# Patient Record
Sex: Male | Born: 1965
Health system: Southern US, Community
[De-identification: ages and names within clinical notes are randomized; demographics above are authoritative.]

## PROBLEM LIST (undated history)

## (undated) DIAGNOSIS — J40 Bronchitis, not specified as acute or chronic: Secondary | ICD-10-CM

## (undated) DIAGNOSIS — I82409 Acute embolism and thrombosis of unspecified deep veins of unspecified lower extremity: Secondary | ICD-10-CM

## (undated) DIAGNOSIS — F17200 Nicotine dependence, unspecified, uncomplicated: Secondary | ICD-10-CM

## (undated) DIAGNOSIS — M2041 Other hammer toe(s) (acquired), right foot: Secondary | ICD-10-CM

## (undated) DIAGNOSIS — M109 Gout, unspecified: Secondary | ICD-10-CM

## (undated) HISTORY — PX: NO PAST SURGERIES: SHX2092

---

## 2011-12-01 ENCOUNTER — Encounter (HOSPITAL_COMMUNITY): Payer: Self-pay | Admitting: *Deleted

## 2011-12-01 ENCOUNTER — Emergency Department (HOSPITAL_COMMUNITY)
Admission: EM | Admit: 2011-12-01 | Discharge: 2011-12-01 | Disposition: A | Discharge status: Home / Self Care | Payer: Self-pay | Attending: Emergency Medicine | Admitting: Emergency Medicine

## 2011-12-01 ENCOUNTER — Emergency Department (HOSPITAL_COMMUNITY): Payer: Self-pay

## 2011-12-01 DIAGNOSIS — R079 Chest pain, unspecified: Secondary | ICD-10-CM | POA: Insufficient documentation

## 2011-12-01 DIAGNOSIS — F17200 Nicotine dependence, unspecified, uncomplicated: Secondary | ICD-10-CM | POA: Insufficient documentation

## 2011-12-01 DIAGNOSIS — M109 Gout, unspecified: Secondary | ICD-10-CM | POA: Insufficient documentation

## 2011-12-01 LAB — CBC WITH DIFFERENTIAL/PLATELET
Basophils Relative: 0 % (ref 0–1)
HCT: 40.9 % (ref 39.0–52.0)
Hemoglobin: 13.7 g/dL (ref 13.0–17.0)
Lymphocytes Relative: 48 % — ABNORMAL HIGH (ref 12–46)
Lymphs Abs: 2.8 10*3/uL (ref 0.7–4.0)
MCHC: 33.5 g/dL (ref 30.0–36.0)
Monocytes Absolute: 0.3 10*3/uL (ref 0.1–1.0)
Monocytes Relative: 5 % (ref 3–12)
Neutro Abs: 2.6 10*3/uL (ref 1.7–7.7)
Neutrophils Relative %: 44 % (ref 43–77)
RBC: 4.85 MIL/uL (ref 4.22–5.81)

## 2011-12-01 LAB — BASIC METABOLIC PANEL
BUN: 11 mg/dL (ref 6–23)
CO2: 24 mEq/L (ref 19–32)
Chloride: 105 mEq/L (ref 96–112)
Creatinine, Ser: 1.24 mg/dL (ref 0.50–1.35)
GFR calc Af Amer: 79 mL/min — ABNORMAL LOW (ref >90)
Glucose, Bld: 157 mg/dL — ABNORMAL HIGH (ref 70–99)
Potassium: 3.8 mEq/L (ref 3.5–5.1)

## 2011-12-01 MED ORDER — GI COCKTAIL ~~LOC~~
30.0000 mL | Freq: Once | ORAL | Status: AC
  Administered 2011-12-01: 30 mL via ORAL
  Filled 2011-12-01: qty 30

## 2011-12-01 MED ORDER — IBUPROFEN 400 MG PO TABS
600.0000 mg | ORAL_TABLET | Freq: Once | ORAL | Status: AC
  Administered 2011-12-01: 600 mg via ORAL
  Filled 2011-12-01: qty 2

## 2011-12-01 MED ORDER — OMEPRAZOLE 20 MG PO CPDR: 20.0000 mg | DELAYED_RELEASE_CAPSULE | Freq: Every day | ORAL | Status: DC

## 2011-12-01 MED ORDER — OXYCODONE-ACETAMINOPHEN 5-325 MG PO TABS
1.0000 | ORAL_TABLET | Freq: Once | ORAL | Status: AC
  Administered 2011-12-01: 1 via ORAL
  Filled 2011-12-01: qty 1

## 2011-12-01 NOTE — ED Notes (Signed)
NAD noted at time of d/c home 

## 2011-12-01 NOTE — ED Notes (Signed)
PT states left side rib pain and burning pain that radiates up through chest intermittently.  Pt reports hip pain and pins and needles in both feet for a while.  Pt is not diabetic

## 2011-12-01 NOTE — ED Notes (Signed)
At time of d/c pt appeared upset that he was not given "percocet or vicoden"

## 2011-12-01 NOTE — ED Provider Notes (Signed)
History     CSN: [id number]  Arrival date & time 12/01/11  1107   First MD Initiated Contact with Patient 12/01/11 1516      Chief complaint: Back pain, chest burning, right great toe pain.   The history is provided by the patient.   the patient reports 3 months of rather persistent left-sided chest burning.  When the burning becomes severe he does not have any shortness of breath nausea vomiting or diaphoresis.  He denies radiation to his arms.  He smokes cigarettes but otherwise has no cardiac risk factors.  He states that he walks 30 minutes each way to the history stated by cigarettes and never develops chest pain or burning at that time.  He also reports he is developing new right great toe pain over the past 24 hours.  He denies fevers or chills.  No erythema.  He has no prior history of gout.  He reports a recent trauma to his right great toe.  He also reports a sensation of "pins and needles" in both feet as well as the tip of his fingers for several months.  He states is not a diabetic and has been checked within the past year for diabetes.  He currently is without a primary care physician and would like information to find one.  Denies cough or congestion.  He has no shortness of breath at this time.  Currently is without any burning discomfort in his chest.  He denies exacerbation of his symptoms with eating or exertion.  History reviewed. No pertinent past medical history.  History reviewed. No pertinent past surgical history.   Family history: Negative for early cardiac disease  History  Substance Use Topics  . Smoking status: Current Every Day Smoker  . Smokeless tobacco: Not on file  . Alcohol Use: No      Review of Systems  All other systems reviewed and are negative.    Allergies  Tramadol and Vicodin  Home Medications  No current outpatient prescriptions on file.  BP 132/91  Pulse 71  Temp 98.1 F (36.7 C) (Oral)  Resp 16  SpO2 100%  Physical Exam    Nursing note and vitals reviewed. Constitutional: He is oriented to person, place, and time. He appears well-developed and well-nourished.  HENT:  Head: Normocephalic and atraumatic.  Eyes: EOM are normal.  Neck: Normal range of motion.  Cardiovascular: Normal rate, regular rhythm, normal heart sounds and intact distal pulses.   Pulmonary/Chest: Effort normal and breath sounds normal. No respiratory distress.  Abdominal: Soft. He exhibits no distension. There is no tenderness.  Musculoskeletal: Normal range of motion.       Right great toe with mild amount of discomfort with movement of his MTP joint on the right great toe.  No erythema or swelling.  Normal perfusion distally.  Normal pulses in his right foot.  Neurological: He is alert and oriented to person, place, and time.       5 out of 5 strength in bilateral upper and lower extremity major muscle groups  Skin: Skin is warm and dry.  Psychiatric: He has a normal mood and affect. Judgment normal.    ED Course  Procedures (including critical care time)   Date: 12/01/2011  Rate: 79  Rhythm: normal sinus rhythm  QRS Axis: normal  Intervals: normal  ST/T Wave abnormalities: normal  Conduction Disutrbances: none  Narrative Interpretation:   Old EKG Reviewed: no prior ecg   Labs Reviewed  CBC WITH DIFFERENTIAL -  Abnormal; Notable for the following:    Lymphocytes Relative 48 (*)     All other components within normal limits  BASIC METABOLIC PANEL - Abnormal; Notable for the following:    Glucose, Bld 157 (*)     GFR calc non Af Amer 68 (*)     GFR calc Af Amer 79 (*)     All other components within normal limits  POCT I-STAT TROPONIN I   Dg Chest 2 View  12/01/2011  *RADIOLOGY REPORT*  Clinical Data: Left-sided chest pain  CHEST - 2 VIEW  Comparison: None.  Findings: The heart and pulmonary vascularity are within normal limits.  The lungs are clear bilaterally.  The bony structures are within normal limits.  IMPRESSION:  No acute abnormality seen.   Original Report Authenticated By: Phillips Odor, M.D.     I personally reviewed the imaging tests through PACS system  I reviewed available ER/hospitalization records thought the EMR   1. Chest pain   2. Gout       MDM  The patient's symptoms are very atypical and I doubt this is ACS.  Cardiac enzymes and chest x-ray pending.  EKG normal sinus rhythm without significant changes.  A lot of his complaints are more primary care related immunodeficient developing a relationship with a primary care doctor.  I suspect this right great toe pain is probably early gout.  He has no signs of erythema or swelling or septic arthritis at this time.  5:01 PM Pain-free at this time.  This is not a cardiac event.  Home with Prilosec.  Close PCP followup        Lyanne Co, MD 12/01/11 [phone]

## 2012-07-01 ENCOUNTER — Emergency Department (HOSPITAL_COMMUNITY): Payer: Self-pay

## 2012-07-01 ENCOUNTER — Encounter (HOSPITAL_COMMUNITY): Payer: Self-pay | Admitting: *Deleted

## 2012-07-01 ENCOUNTER — Emergency Department (HOSPITAL_COMMUNITY)
Admission: EM | Admit: 2012-07-01 | Discharge: 2012-07-01 | Disposition: A | Discharge status: Home / Self Care | Payer: Self-pay | Attending: Emergency Medicine | Admitting: Emergency Medicine

## 2012-07-01 DIAGNOSIS — R05 Cough: Secondary | ICD-10-CM | POA: Insufficient documentation

## 2012-07-01 DIAGNOSIS — R059 Cough, unspecified: Secondary | ICD-10-CM | POA: Insufficient documentation

## 2012-07-01 DIAGNOSIS — J069 Acute upper respiratory infection, unspecified: Secondary | ICD-10-CM | POA: Insufficient documentation

## 2012-07-01 DIAGNOSIS — R0982 Postnasal drip: Secondary | ICD-10-CM | POA: Insufficient documentation

## 2012-07-01 DIAGNOSIS — J029 Acute pharyngitis, unspecified: Secondary | ICD-10-CM | POA: Insufficient documentation

## 2012-07-01 DIAGNOSIS — F17200 Nicotine dependence, unspecified, uncomplicated: Secondary | ICD-10-CM | POA: Insufficient documentation

## 2012-07-01 DIAGNOSIS — Z8709 Personal history of other diseases of the respiratory system: Secondary | ICD-10-CM | POA: Insufficient documentation

## 2012-07-01 HISTORY — DX: Bronchitis, not specified as acute or chronic: J40

## 2012-07-01 LAB — RAPID STREP SCREEN (MED CTR MEBANE ONLY): Streptococcus, Group A Screen (Direct): NEGATIVE

## 2012-07-01 MED ORDER — GUAIFENESIN ER 600 MG PO TB12: 1200.0000 mg | ORAL_TABLET | Freq: Two times a day (BID) | ORAL | Status: DC

## 2012-07-01 MED ORDER — HYDROCODONE-ACETAMINOPHEN 7.5-325 MG/15ML PO SOLN: 15.0000 mL | Freq: Four times a day (QID) | ORAL | Status: DC | PRN

## 2012-07-01 MED ORDER — DIPHENHYDRAMINE HCL 25 MG PO TABS: 25.0000 mg | ORAL_TABLET | Freq: Four times a day (QID) | ORAL | Status: DC | PRN

## 2012-07-01 NOTE — ED Notes (Signed)
C/o nasal congestion, sore throat and cough x 1 week.

## 2012-07-01 NOTE — ED Notes (Signed)
Pt reports nasal congestion, cough, throat drainage and soreness x 1 week.

## 2012-07-01 NOTE — ED Provider Notes (Signed)
History     CSN: [id number]  Arrival date & time 07/01/12  1213   First MD Initiated Contact with Patient 07/01/12 1238      Chief Complaint  Patient presents with  . Nasal Congestion    (Consider location/radiation/quality/duration/timing/severity/associated sxs/prior treatment) The history is provided by the patient. No language interpreter was used.  SUBJECTIVE:  Terry Poole is a 47 y.o. male who complains of coryza, congestion, sore throat, post nasal drip, productive cough, myalgias, headache for 7 days. He denies a history of anorexia, dizziness, fatigue, nausea, rash, shortness of breath, sweats, vomiting, weakness and wheezing and denies a history of asthma. Patient does smoke cigarettes. .Denies DOE, SOB, chest tightness or pressure, radiation to left arm, jaw or back, or diaphoresis. Denies dysuria, flank pain, suprapubic pain, frequency, urgency, or hematuria. Denies headaches, light headedness, weakness, visual disturbances. Denies abdominal pain, nausea, vomiting, diarrhea or constipation.      Past Medical History  Diagnosis Date  . Bronchitis     History reviewed. No pertinent past surgical history.  History reviewed. No pertinent family history.  History  Substance Use Topics  . Smoking status: Current Every Day Smoker -- 0.50 packs/day    Types: Cigarettes  . Smokeless tobacco: Not on file  . Alcohol Use: No      Review of Systems Ten systems reviewed and are negative for acute change, except as noted in the HPI.   Allergies  Tramadol and Vicodin  Home Medications  No current outpatient prescriptions on file.  BP 134/88  Pulse 77  Temp(Src) 98.4 F (36.9 C)  Resp 20  Ht 5\' 9"  (1.753 m)  Wt 235 lb (106.595 kg)  BMI 34.69 kg/m2  SpO2 99%  Physical Exam Physical Exam  Appears moderately ill but not toxic; temperature as noted in vitals. Ears normal. Eyes:glassy appearance, no discharge  Heart: RRR, NO M/G/R Throat and pharynx normal.    Neck supple. No adenopathyhy in the neck.  Sinuses non tender.  The chest is clear, with decreased breath sounds over the left lower lung. Abdomen is soft and nontende    ED Course  Procedures (including critical care time)  Labs Reviewed  RAPID STREP SCREEN  CULTURE, GROUP A STREP   Dg Chest 2 View  07/01/2012   *RADIOLOGY REPORT*  Clinical Data: Sob, cp, a cough, and congestion x 1 week. Pt has hx of bronchitis. Smoker.  CHEST - 2 VIEW  Comparison: 12/01/2011  Findings: Left basilar subsegmental atelectasis or scarring. Thoracic spondylosis noted.  Borderline cardiomegaly (cardiothoracic index 50%).  Unchanged lucent lesion in medial right clavicle, thin sclerotic margin, 1.4 x 0.7 cm.  IMPRESSION:  1.  Left basilar subsegmental atelectasis or scarring. 2.  Borderline cardiomegaly. 3.  Lucent lesion medial right clavicle, unchanged from 7 months ago, probably a fibrous cortical defect or similar benign lesion.   Original Report Authenticated By: Gaylyn Rong, M.D.     1. URI, acute       MDM  4:50 PM BP 134/88  Pulse 77  Temp(Src) 98.4 F (36.9 C)  Resp 20  Ht 5\' 9"  (1.753 m)  Wt 235 lb (106.595 kg)  BMI 34.69 kg/m2  SpO2 99% Patient with URI. Discussed abnormal findings and suggest repeat cxr in 6 mos to make sure no changes in the lesion. Pt CXR negative for acute infiltrate. Patients symptoms are consistent with URI, likely viral etiology. Discussed that antibiotics are not indicated for viral infections. Pt will be discharged with symptomatic treatment.  Verbalizes understanding and is agreeable with plan. Pt is hemodynamically stable & in NAD prior to dc.          Arthor Captain, PA-C 07/01/12 1652

## 2012-07-02 NOTE — ED Provider Notes (Signed)
Medical screening examination/treatment/procedure(s) were performed by non-physician practitioner and as supervising physician I was immediately available for consultation/collaboration.    Vida Roller, MD 07/02/12 [phone]

## 2012-07-03 LAB — CULTURE, GROUP A STREP

## 2012-09-14 ENCOUNTER — Emergency Department (HOSPITAL_COMMUNITY): Payer: Self-pay

## 2012-09-14 ENCOUNTER — Encounter (HOSPITAL_COMMUNITY): Payer: Self-pay | Admitting: Nurse Practitioner

## 2012-09-14 ENCOUNTER — Emergency Department (HOSPITAL_COMMUNITY)
Admission: EM | Admit: 2012-09-14 | Discharge: 2012-09-14 | Disposition: A | Discharge status: Home / Self Care | Payer: Self-pay | Attending: Emergency Medicine | Admitting: Emergency Medicine

## 2012-09-14 DIAGNOSIS — R42 Dizziness and giddiness: Secondary | ICD-10-CM | POA: Insufficient documentation

## 2012-09-14 DIAGNOSIS — Z8709 Personal history of other diseases of the respiratory system: Secondary | ICD-10-CM | POA: Insufficient documentation

## 2012-09-14 DIAGNOSIS — R042 Hemoptysis: Secondary | ICD-10-CM | POA: Insufficient documentation

## 2012-09-14 DIAGNOSIS — F17200 Nicotine dependence, unspecified, uncomplicated: Secondary | ICD-10-CM | POA: Insufficient documentation

## 2012-09-14 DIAGNOSIS — F411 Generalized anxiety disorder: Secondary | ICD-10-CM | POA: Insufficient documentation

## 2012-09-14 DIAGNOSIS — K029 Dental caries, unspecified: Secondary | ICD-10-CM | POA: Insufficient documentation

## 2012-09-14 LAB — BASIC METABOLIC PANEL
BUN: 10 mg/dL (ref 6–23)
Chloride: 106 mEq/L (ref 96–112)
GFR calc Af Amer: >90 mL/min (ref >90)
GFR calc non Af Amer: 86 mL/min — ABNORMAL LOW (ref >90)
Potassium: 3.7 mEq/L (ref 3.5–5.1)

## 2012-09-14 LAB — CBC
HCT: 42.5 % (ref 39.0–52.0)
MCHC: 33.4 g/dL (ref 30.0–36.0)
Platelets: 237 10*3/uL (ref 150–400)
RDW: 14.7 % (ref 11.5–15.5)
WBC: 6.3 10*3/uL (ref 4.0–10.5)

## 2012-09-14 LAB — POCT I-STAT TROPONIN I: Troponin i, poc: 0.01 ng/mL (ref 0.00–0.08)

## 2012-09-14 NOTE — ED Notes (Signed)
C/o burning pain in throat and lower back, bloody sputum this am. Denies cough, vomiting. States "i just kept spitting up blood."

## 2012-09-14 NOTE — ED Provider Notes (Signed)
CSN: [id number]     Arrival date & time 09/14/12  1020 History     First MD Initiated Contact with Patient 09/14/12 1121     Chief Complaint  Patient presents with  . Hemoptysis   (Consider location/radiation/quality/duration/timing/severity/associated sxs/prior Treatment) HPI 47 year old male presents emergency department chief complaint of "spitting out blood." History is limited by patient's capacity to communicate effectively.  I suspect an element of limited cognitive ability.  Patient states that this morning he cleared his throat and then began spitting up approximately a teaspoon of blood.  He stated this occurred approximately 10 times.  He is unsure if he coughed or vomited.  He states that it made him feel dizzy and anxious.  He has never done this before.  He is a currently a pack-a-day smoker.  Patient denies any recent history of sinus infection, sore throat, injury to mouth.  He does have some dental decay.  Patient denies symptoms of upper respiratory infection or cough.  He states he has a history of previous alcohol abuse but has not drank alcohol in the last 26 years.  Patient denies abdominal pain, nausea, vomiting, chest pain, shortness of breath.  He denies any history of recent unilateral calf pain, swelling, previous history of DVT or PE.  Patient denies any pain currently.  He takes no anticoagulants.  Past Medical History  Diagnosis Date  . Bronchitis    History reviewed. No pertinent past surgical history. History reviewed. No pertinent family history. History  Substance Use Topics  . Smoking status: Current Every Day Smoker -- 0.50 packs/day    Types: Cigarettes  . Smokeless tobacco: Not on file  . Alcohol Use: No    Review of Systems Ten systems reviewed and are negative for acute change, except as noted in the HPI.   Allergies  Tramadol and Vicodin  Home Medications  No current outpatient prescriptions on file. BP 156/92  Pulse 90  Temp(Src) 98.1  F (36.7 C) (Oral)  Resp 17  Ht 5\' 9"  (1.753 m)  Wt 236 lb (107.049 kg)  BMI 34.84 kg/m2  SpO2 96% Physical Exam Physical Exam  Nursing note and vitals reviewed. Constitutional: He appears well-developed and well-nourished. No distress.  HENT:  Head: Normocephalic and atraumatic.  Eyes: Conjunctivae normal are normal. No scleral icterus.  Mouth: Multiple decayed teeth. Throat: Mildly erythematous oropharynx without exudate, edema.  Uvula midline. Neck: Normal range of motion. Neck supple.  Cardiovascular: Normal rate, regular rhythm and normal heart sounds.   Pulmonary/Chest: Effort normal and breath sounds normal. No respiratory distress.  Abdominal: Soft. There is no tenderness.  Musculoskeletal: He exhibits no edema.  no peripheral edema or calf tenderness.   Neurological: He is alert.  Skin: Skin is warm and dry. He is not diaphoretic.  Psychiatric: His behavior is normal.    ED Course   Procedures (including critical care time)  Labs Reviewed  CBC  BASIC METABOLIC PANEL   Dg Chest 2 View  09/14/2012   *RADIOLOGY REPORT*  Clinical Data: Hemoptysis, bronchitis, smoker  CHEST - 2 VIEW  Comparison: 07/01/2012  Findings: Normal heart size and vascularity.  Chronic central bronchitic changes without focal pneumonia, collapse, consolidation, edema, effusion or pneumothorax.  Trachea midline. Mild thoracic degenerative change.  Overall stable exam.  IMPRESSION: Stable chest exam.  No superimposed acute process   Original Report Authenticated By: Judie Petit. Shick, M.D.   1. Spitting up blood      Date: 09/14/2012  Rate: 85  Rhythm: normal  sinus rhythm  QRS Axis: normal  Intervals: normal  ST/T Wave abnormalities: normal  Conduction Disutrbances: none  Narrative Interpretation:   Old EKG Reviewed: No significant changes noted  ' MDM  12:16 PM Filed Vitals:   09/14/12 1151  BP: 154/106  Pulse: 67  Temp:   Resp:    The patient here with complaint of spitting up blood.   Chest x-ray shows chronic bronchitic changes of associated with daily smoking.  No acute abnormalities noted.  Patient is hypertensive.  Patient's EKG shows no signs of acute ischemia or abnormality.  Negative troponin.  Patient mildly elevated glucose. The vitals are otherwise unremarkable.   Patient seen in shared visit with Dr. Jodi Mourning. No acute abnormality.  Patient poor historian however I feel that there is no concern for tuberculosis or intra-pulmonary pathology.  Patient is hypertensive needs PCP followup.  He will be discharged to community health.The patient appears reasonably screened and/or stabilized for discharge and I doubt any other medical condition or other [facility] requiring further screening, evaluation, or treatment in the ED at this time prior to discharge.   Arthor Captain, PA-C 09/14/12 2133  Medical screening examination/treatment/procedure(s) were conducted as a shared visit with non-physician practitioner(s) or resident and myself. I personally evaluated the patient during the encounter and agree with the findings and plan unless otherwise indicated.  Few episodes of small amount of spitting up blood. Pt denies blood thinners, TB/ jail exposure, sick contacts, travel. Well appearing on exam, lungs clear. No abd pain.  CNs intact. Lab and cxr unremarkable. Smoker. Close fup outpt discussed.    Enid Skeens, MD 09/14/12 2258

## 2012-11-02 ENCOUNTER — Emergency Department (HOSPITAL_COMMUNITY)
Admission: EM | Admit: 2012-11-02 | Discharge: 2012-11-02 | Disposition: A | Discharge status: Home / Self Care | Payer: Self-pay | Attending: Emergency Medicine | Admitting: Emergency Medicine

## 2012-11-02 ENCOUNTER — Encounter (HOSPITAL_COMMUNITY): Payer: Self-pay | Admitting: *Deleted

## 2012-11-02 DIAGNOSIS — K089 Disorder of teeth and supporting structures, unspecified: Secondary | ICD-10-CM | POA: Insufficient documentation

## 2012-11-02 DIAGNOSIS — K0889 Other specified disorders of teeth and supporting structures: Secondary | ICD-10-CM

## 2012-11-02 DIAGNOSIS — K029 Dental caries, unspecified: Secondary | ICD-10-CM | POA: Insufficient documentation

## 2012-11-02 DIAGNOSIS — Z8709 Personal history of other diseases of the respiratory system: Secondary | ICD-10-CM | POA: Insufficient documentation

## 2012-11-02 DIAGNOSIS — F17200 Nicotine dependence, unspecified, uncomplicated: Secondary | ICD-10-CM | POA: Insufficient documentation

## 2012-11-02 MED ORDER — PENICILLIN V POTASSIUM 500 MG PO TABS: 500.0000 mg | ORAL_TABLET | Freq: Four times a day (QID) | ORAL | Status: DC

## 2012-11-02 MED ORDER — OXYCODONE-ACETAMINOPHEN 5-325 MG PO TABS
2.0000 | ORAL_TABLET | Freq: Once | ORAL | Status: AC
  Administered 2012-11-02: 2 via ORAL
  Filled 2012-11-02: qty 2

## 2012-11-02 MED ORDER — OXYCODONE-ACETAMINOPHEN 5-325 MG PO TABS: 2.0000 | ORAL_TABLET | ORAL | Status: DC | PRN

## 2012-11-02 MED ORDER — PENICILLIN V POTASSIUM 250 MG PO TABS
500.0000 mg | ORAL_TABLET | Freq: Once | ORAL | Status: AC
  Administered 2012-11-02: 500 mg via ORAL
  Filled 2012-11-02: qty 2

## 2012-11-02 NOTE — Discharge Planning (Signed)
[facility] [employer]  Patient was seen 09/14/12 and given the orange card application. Patient is here today (11/02/12) with dental pain and the completed application. I made the patient an appointment for Nov 09, 2012 at the [facility] to turn in the completed application and to obtain his orange card.

## 2012-11-02 NOTE — ED Provider Notes (Signed)
Medical screening examination/treatment/procedure(s) were performed by non-physician practitioner and as supervising physician I was immediately available for consultation/collaboration.  Doug Sou, MD 11/02/12 [phone]

## 2012-11-02 NOTE — ED Notes (Signed)
Pt reports right upper dental pain and multiple broken teeth and now swelling and pain. Airway intact.

## 2012-11-02 NOTE — ED Provider Notes (Signed)
CSN: [id number]     Arrival date & time 11/02/12  4098 History   First MD Initiated Contact with Patient 11/02/12 [phone]     Chief Complaint  Patient presents with  . Dental Pain   (Consider location/radiation/quality/duration/timing/severity/associated sxs/prior Treatment) HPI Comments: The patient is a 47 year old otherwise healthy male who presents with dental pain that started suddenly starting yesterday. The dental pain is severe, constant and progressively worsening. The pain is aching and located in right upper molar. The pain does not radiate. Eating makes the pain worse. Nothing makes the pain better. The patient has not tried anything for pain. No associated symptoms. Patient denies headache, neck pain/stiffness, fever, NVD, edema, sore throat, throat swelling, wheezing, SOB, chest pain, abdominal pain.     Patient is a 47 y.o. male presenting with tooth pain.  Dental Pain   Past Medical History  Diagnosis Date  . Bronchitis    History reviewed. No pertinent past surgical history. History reviewed. No pertinent family history. History  Substance Use Topics  . Smoking status: Current Every Day Smoker -- 0.50 packs/day    Types: Cigarettes  . Smokeless tobacco: Not on file  . Alcohol Use: No    Review of Systems  HENT: Positive for dental problem.   All other systems reviewed and are negative.    Allergies  Tramadol and Vicodin  Home Medications  No current outpatient prescriptions on file. BP 148/98  Pulse 91  Temp(Src) 98.1 F (36.7 C) (Oral)  Resp 19  SpO2 94% Physical Exam  Nursing note and vitals reviewed. Constitutional: He is oriented to person, place, and time. He appears well-developed and well-nourished. No distress.  HENT:  Head: Normocephalic and atraumatic.  Mouth/Throat: Oropharynx is clear and moist. No oropharyngeal exudate.  Poor dentition. Multiple cracked and decayed teeth. Right upper molar tender to percussion.   Eyes: Conjunctivae and  EOM are normal. Pupils are equal, round, and reactive to light.  Neck: Normal range of motion.  Cardiovascular: Normal rate and regular rhythm.  Exam reveals no gallop and no friction rub.   No murmur heard. Pulmonary/Chest: Effort normal and breath sounds normal. He has no wheezes. He has no rales. He exhibits no tenderness.  Musculoskeletal: Normal range of motion.  Neurological: He is alert and oriented to person, place, and time. Coordination normal.  Speech is goal-oriented. Moves limbs without ataxia.   Skin: Skin is warm and dry.  Psychiatric: He has a normal mood and affect. His behavior is normal.    ED Course  Procedures (including critical care time) Labs Review Labs Reviewed - No data to display Imaging Review No results found.  MDM   1. Pain, dental     10:23 AM Patient will have Veetid and Percocet for dental pain. Patient will have prescriptions for both and instructions to follow up with dentist from resource guide. Vitals stable and patient afebrile.     Emilia Beck, PA-C 11/02/12 1031

## 2012-11-09 ENCOUNTER — Ambulatory Visit: Payer: Self-pay

## 2013-04-11 ENCOUNTER — Encounter (HOSPITAL_COMMUNITY): Payer: Self-pay | Admitting: Emergency Medicine

## 2013-04-11 DIAGNOSIS — M545 Low back pain, unspecified: Secondary | ICD-10-CM | POA: Insufficient documentation

## 2013-04-11 DIAGNOSIS — F17200 Nicotine dependence, unspecified, uncomplicated: Secondary | ICD-10-CM | POA: Insufficient documentation

## 2013-04-11 DIAGNOSIS — R079 Chest pain, unspecified: Secondary | ICD-10-CM | POA: Insufficient documentation

## 2013-04-11 DIAGNOSIS — R109 Unspecified abdominal pain: Secondary | ICD-10-CM | POA: Insufficient documentation

## 2013-04-11 DIAGNOSIS — M546 Pain in thoracic spine: Secondary | ICD-10-CM | POA: Insufficient documentation

## 2013-04-11 DIAGNOSIS — J029 Acute pharyngitis, unspecified: Secondary | ICD-10-CM | POA: Insufficient documentation

## 2013-04-11 DIAGNOSIS — Z8709 Personal history of other diseases of the respiratory system: Secondary | ICD-10-CM | POA: Insufficient documentation

## 2013-04-11 NOTE — ED Notes (Signed)
Patient presents with c/o generalized body aches and sore throat

## 2013-04-12 ENCOUNTER — Emergency Department (HOSPITAL_COMMUNITY)
Admission: EM | Admit: 2013-04-12 | Discharge: 2013-04-12 | Disposition: A | Discharge status: Home / Self Care | Payer: Self-pay | Attending: Emergency Medicine | Admitting: Emergency Medicine

## 2013-04-12 ENCOUNTER — Emergency Department (HOSPITAL_COMMUNITY): Payer: Self-pay

## 2013-04-12 DIAGNOSIS — M549 Dorsalgia, unspecified: Secondary | ICD-10-CM

## 2013-04-12 LAB — HEPATIC FUNCTION PANEL
ALT: 36 U/L (ref 0–53)
AST: 21 U/L (ref 0–37)
Albumin: 4 g/dL (ref 3.5–5.2)
Alkaline Phosphatase: 106 U/L (ref 39–117)
TOTAL PROTEIN: 7.5 g/dL (ref 6.0–8.3)
Total Bilirubin: 0.3 mg/dL (ref 0.3–1.2)

## 2013-04-12 LAB — CBC WITH DIFFERENTIAL/PLATELET
BASOS PCT: 0 % (ref 0–1)
Basophils Absolute: 0 10*3/uL (ref 0.0–0.1)
EOS ABS: 0.2 10*3/uL (ref 0.0–0.7)
Eosinophils Relative: 2 % (ref 0–5)
HCT: 40.6 % (ref 39.0–52.0)
Hemoglobin: 13.6 g/dL (ref 13.0–17.0)
Lymphocytes Relative: 53 % — ABNORMAL HIGH (ref 12–46)
Lymphs Abs: 4 10*3/uL (ref 0.7–4.0)
MCH: 28.8 pg (ref 26.0–34.0)
MCHC: 33.5 g/dL (ref 30.0–36.0)
MCV: 85.8 fL (ref 78.0–100.0)
Monocytes Absolute: 0.4 10*3/uL (ref 0.1–1.0)
Monocytes Relative: 5 % (ref 3–12)
NEUTROS PCT: 39 % — AB (ref 43–77)
Neutro Abs: 3 10*3/uL (ref 1.7–7.7)
PLATELETS: 232 10*3/uL (ref 150–400)
RBC: 4.73 MIL/uL (ref 4.22–5.81)
RDW: 14.4 % (ref 11.5–15.5)
WBC: 7.5 10*3/uL (ref 4.0–10.5)

## 2013-04-12 LAB — D-DIMER, QUANTITATIVE (NOT AT ARMC): D-Dimer, Quant: <0.27 ug/mL-FEU (ref 0.00–0.48)

## 2013-04-12 LAB — I-STAT CHEM 8, ED
BUN: 8 mg/dL (ref 6–23)
CHLORIDE: 102 meq/L (ref 96–112)
Calcium, Ion: 1.2 mmol/L (ref 1.12–1.23)
Creatinine, Ser: 1.2 mg/dL (ref 0.50–1.35)
GLUCOSE: 104 mg/dL — AB (ref 70–99)
HEMATOCRIT: 45 % (ref 39.0–52.0)
HEMOGLOBIN: 15.3 g/dL (ref 13.0–17.0)
POTASSIUM: 4.5 meq/L (ref 3.7–5.3)
SODIUM: 143 meq/L (ref 137–147)
TCO2: 26 mmol/L (ref 0–100)

## 2013-04-12 LAB — LIPASE, BLOOD: Lipase: 13 U/L (ref 11–59)

## 2013-04-12 LAB — I-STAT TROPONIN, ED: TROPONIN I, POC: 0 ng/mL (ref 0.00–0.08)

## 2013-04-12 MED ORDER — GI COCKTAIL ~~LOC~~
30.0000 mL | Freq: Once | ORAL | Status: AC
  Administered 2013-04-12: 30 mL via ORAL
  Filled 2013-04-12: qty 30

## 2013-04-12 MED ORDER — NAPROXEN 500 MG PO TABS: 500.0000 mg | ORAL_TABLET | Freq: Two times a day (BID) | ORAL | Status: DC

## 2013-04-12 MED ORDER — KETOROLAC TROMETHAMINE 30 MG/ML IJ SOLN
30.0000 mg | Freq: Once | INTRAMUSCULAR | Status: AC
  Administered 2013-04-12: 30 mg via INTRAVENOUS
  Filled 2013-04-12: qty 1

## 2013-04-12 NOTE — ED Notes (Signed)
Patient returned to room from X-ray 

## 2013-04-12 NOTE — ED Provider Notes (Signed)
CSN: [id number]     Arrival date & time 04/11/13  2300 History   First MD Initiated Contact with Patient 04/12/13 0030     Chief Complaint  Patient presents with  . Generalized Body Aches  . Sore Throat     (Consider location/radiation/quality/duration/timing/severity/associated sxs/prior Treatment) HPI Comments: 48 year old male, history of being a daily half a pack per day smoker who presents with a complaint of back pain. This started approximately 30 hours ago. It started at rest, it is a sharp stabbing pain that has become more of a burning sensation and radiates from his lower back up to his upper back and shoulder blades, it also radiates around to his abdomen and epigastrium. The symptoms are persistent but seemed to get worse when he moves including rotation of the hips, flexion and extension. He has trouble bending over to tie his shoes because it makes the pain worse. He has no associated swelling of the legs, denies chest pain, denies rashes, denies diarrhea rectal bleeding or dysuria. He has no history of heart disease, takes no medications for hypertension diabetes or hypercholesterolemia. He has not been coughing. He denies any injuries to his back.  Patient is a 48 y.o. male presenting with pharyngitis. The history is provided by the patient.  Sore Throat Associated symptoms include chest pain and abdominal pain. Pertinent negatives include no headaches and no shortness of breath.    Past Medical History  Diagnosis Date  . Bronchitis    History reviewed. No pertinent past surgical history. History reviewed. No pertinent family history. History  Substance Use Topics  . Smoking status: Current Every Day Smoker -- 0.50 packs/day    Types: Cigarettes  . Smokeless tobacco: Never Used  . Alcohol Use: No    Review of Systems  Constitutional: Negative for fever and chills.  HENT: Negative for sore throat.   Eyes: Negative for visual disturbance.  Respiratory: Negative for  cough and shortness of breath.   Cardiovascular: Positive for chest pain.  Gastrointestinal: Positive for abdominal pain. Negative for nausea, vomiting and diarrhea.  Genitourinary: Negative for dysuria and frequency.  Musculoskeletal: Positive for back pain. Negative for neck pain.  Skin: Negative for rash.  Neurological: Negative for weakness, numbness and headaches.  Hematological: Negative for adenopathy.  Psychiatric/Behavioral: Negative for behavioral problems.      Allergies  Tramadol and Vicodin  Home Medications   Current Outpatient Rx  Name  Route  Sig  Dispense  Refill  . aspirin 325 MG tablet   Oral   Take 325 mg by mouth once.         . naproxen (NAPROSYN) 500 MG tablet   Oral   Take 1 tablet (500 mg total) by mouth 2 (two) times daily with a meal.   30 tablet   0    BP 132/90  Pulse 60  Temp(Src) 98.7 F (37.1 C) (Oral)  Resp 14  Ht 5\' 9"  (1.753 m)  Wt 236 lb 6.4 oz (107.23 kg)  BMI 34.89 kg/m2  SpO2 97% Physical Exam  Nursing note and vitals reviewed. Constitutional: He appears well-developed and well-nourished. No distress.  HENT:  Head: Normocephalic and atraumatic.  Mouth/Throat: Oropharynx is clear and moist. No oropharyngeal exudate.  Eyes: Conjunctivae and EOM are normal. Pupils are equal, round, and reactive to light. Right eye exhibits no discharge. Left eye exhibits no discharge. No scleral icterus.  Neck: Normal range of motion. Neck supple. No JVD present. No thyromegaly present.  Cardiovascular: Normal rate,  regular rhythm, normal heart sounds and intact distal pulses.  Exam reveals no gallop and no friction rub.   No murmur heard. Pulmonary/Chest: Effort normal and breath sounds normal. No respiratory distress. He has no wheezes. He has no rales.  Abdominal: Soft. Bowel sounds are normal. He exhibits no distension and no mass. There is tenderness ( Mild tenderness to palpation in the epigastrium, no guarding).  Musculoskeletal: Normal  range of motion. He exhibits tenderness ( Tenderness to palpation across the lower back, mid back and upper back. No central spinal tenderness). He exhibits no edema.  Lymphadenopathy:    He has no cervical adenopathy.  Neurological: He is alert. Coordination normal.  Skin: Skin is warm and dry. No rash noted. No erythema.  Psychiatric: He has a normal mood and affect. His behavior is normal.    ED Course  Procedures (including critical care time) Labs Review Labs Reviewed  CBC WITH DIFFERENTIAL - Abnormal; Notable for the following:    Neutrophils Relative % 39 (*)    Lymphocytes Relative 53 (*)    All other components within normal limits  I-STAT CHEM 8, ED - Abnormal; Notable for the following:    Glucose, Bld 104 (*)    All other components within normal limits  D-DIMER, QUANTITATIVE  LIPASE, BLOOD  HEPATIC FUNCTION PANEL  I-STAT TROPOININ, ED   Imaging Review No results found.   EKG Interpretation   Date/Time:  Tuesday April 12 2013 01:01:26 EDT Ventricular Rate:  70 PR Interval:  170 QRS Duration: 117 QT Interval:  392 QTC Calculation: 423 R Axis:   50 Text Interpretation:  Sinus rhythm Nonspecific intraventricular conduction  delay ST elev, probable normal early repol pattern Baseline wander in  lead(s) V4 since last tracing no significant change Confirmed by Johnnathan Hagemeister   MD, Ladarrious Kirksey ([id number]) on 04/12/2013 1:10:15 AM      MDM   Final diagnoses:  Back pain    The patient has a normal cardiac and pulmonary exam. He will need an EKG, work up for chest cause, d-dimer to rule out other sources, might be musculoskeletal but concerned given the distribution of the pain.  Pain improved after medications as below, vital signs normal, d-dimer normal, chest x-ray EKG and other laboratory studies are all normal. The patient was given reassurance and appears stable for discharge. He is agreeable to the discharge plan  Meds given in ED:  Medications  ketorolac (TORADOL) 30  MG/ML injection 30 mg (30 mg Intravenous Given 04/12/13 0055)  gi cocktail (Maalox,Lidocaine,Donnatal) (30 mLs Oral Given 04/12/13 0055)    New Prescriptions   NAPROXEN (NAPROSYN) 500 MG TABLET    Take 1 tablet (500 mg total) by mouth 2 (two) times daily with a meal.        Johnna Acosta, MD 04/12/13 [phone]

## 2013-04-12 NOTE — Discharge Instructions (Signed)
Your testing was normal   Follow up with a family doctor  Please call your doctor for a followup appointment within 24-48 hours. When you talk to your doctor please let them know that you were seen in the emergency department and have them acquire all of your records so that they can discuss the findings with you and formulate a treatment plan to fully care for your new and ongoing problems.   Emergency Department Resource Guide 1) Find a Doctor and Pay Out of Pocket Although you won't have to find out who is covered by your insurance plan, it is a good idea to ask around and get recommendations. You will then need to call the office and see if the doctor you have chosen will accept you as a new patient and what types of options they offer for patients who are self-pay. Some doctors offer discounts or will set up payment plans for their patients who do not have insurance, but you will need to ask so you aren't surprised when you get to your appointment.  2) Contact Your Local Health Department Not all health departments have doctors that can see patients for sick visits, but many do, so it is worth a call to see if yours does. If you don't know where your local health department is, you can check in your phone book. The CDC also has a tool to help you locate your state's health department, and many state websites also have listings of all of their local health departments.  3) Find a [facility] If your illness is not likely to be very severe or complicated, you may want to try a walk in clinic. These are popping up all over the country in pharmacies, drugstores, and shopping centers. They're usually staffed by nurse practitioners or physician assistants that have been trained to treat common illnesses and complaints. They're usually fairly quick and inexpensive. However, if you have serious medical issues or chronic medical problems, these are probably not your best option.  No Primary Care  Doctor: - Call Health Connect at  [phone] - they can help you locate a primary care doctor that  accepts your insurance, provides certain services, etc. - Physician Referral Service- [phone]  Chronic Pain Problems: Organization         Address  Phone   Notes  [facility]  [phone] Patients need to be referred by their primary care doctor.   Medication Assistance: Organization         Address  Phone   Notes  [facility] Medication [facility] [address] [place], [place] [address] [phone] --Must be a resident of [facility] -- Must have NO insurance coverage whatsoever (no Medicaid/ Medicare, etc.) -- The pt. MUST have a primary care doctor that directs their care regularly and follows them in the community   Med[facility]ist  [phone]   [employer]  [phone]    Agencies that provide inexpensive medical care: Organization         Address  Phone   Notes  [place]  [phone]   [facility]    [phone]   [facility] [place],  [phone]   [place] [address], [place] [phone]   Planned Parenthood    [phone]   [facility]    [phone]   Community  Health and [place]  [address], [place] Phone:  [phone], Fax:  [phone] Hours of Operation:  9 am - 6 pm, M-F.  Also accepts Medicaid/Medicare and self-pay.  [facility] for [place], [address], [place] Phone: [phone], Fax: [phone]. Hours of Operation:  8:30 am - 5:30 pm, M-F.  Also accepts Medicaid and self-pay.  [facility] [place] [address], [place] Phone: [phone]   [place], [place], [place] [phone], Ext. [phone] Mondays & Thursdays: 7-9 AM.  First 15 patients are seen on a first  come, first serve basis.    [place] Providers:  Organization         Address  Phone   Notes  [facility] [address],  [phone] Also accepts self-pay patients.  [facility] [address], [place]  [phone]   [place], [address], [place] [phone]   [facility] [address] [place] [phone]   Lucianne Lei [address], [place]   [phone] Only accepts [place] Access [place] patients after they have their name applied to their card.   Self-Pay (no insurance) in [facility]:  Organization         Address  Phone   Notes  Sickle Cell Patients, [facility] [place] [phone]   [facility] [place] [phone]   [facility] [place]  [place], [place], [place] [phone]   [facility]/Dr. Osei-Bonsu  [address] [place] or [address], [place] [phone] Phone number for both [place] and [place] locations is the same.  Urgent Medical and [facility] [address], [place] [phone]   [facility] [address], [place] or [address] [phone]   [facility] [address], [place] [phone], phone; [phone], fax Sees patients 1st and 3rd Saturday of every month.  Must not qualify for public or private insurance (i.e. Medicaid, Medicare, [place] Health Choice, Veterans' Benefits)  Household income should be no more than 200% of the poverty level The clinic cannot treat you if you are pregnant or think you are pregnant  Sexually transmitted diseases are not treated at the clinic.    Dental Care: Organization          Address  Phone  Notes  [facility] Department of [facility] [phone] Accepts children up to age 32 who are enrolled in [place] or [place]; pregnant women with a Medicaid card; and children who have applied for Medicaid or [place] Health Choice, but were declined, whose parents can pay a reduced fee at time of service.  [facility] Department of [facility]  [address], [place] [phone] Accepts children up to age 25 who are enrolled in [place] or [place]; pregnant women with a Medicaid card; and children who have applied for Medicaid or [place] Health Choice, but were declined, whose parents can pay a reduced fee at time of service.  [place] Adult Dental Access PROGRAM  [phone]  [place], [place] [phone] Patients are seen by appointment only. Walk-ins are not accepted. [place] will see patients 90 years of age and older. Monday - Tuesday (8am-5pm) Most Wednesdays (8:30-5pm) $30 per visit, cash only  [facility] Adult Dental Access PROGRAM  [address], [facility] [phone] Patients are seen by appointment only. Walk-ins are not accepted. [place] will see patients 1 years of age and older. One Wednesday Evening (Monthly: Volunteer Based).  $30 per visit, cash only  [place]  [phone] for adults; Children under age 81, call Graduate Pediatric Dentistry at [phone]. Children aged 1-14, please call [phone] to request a pediatric application.  Dental services are provided in all areas of dental care including fillings, crowns and bridges, complete and partial dentures, implants, gum treatment, root canals, and extractions. Preventive care is also provided. Treatment is provided to both adults and children. Patients are selected via a lottery and there is often a waiting list.   [facility] [address] [place]  [phone] [email or web address]   [facility] [address] [place], [place] [phone], Ext. [phone] Second and Fourth Thursday of each month, opens at 6:30 AM; Clinic ends at 9 AM.  Patients are seen on a first-come first-served basis, and a limited number are seen during each clinic.   [facility]  [address] Hillard Danker [place], [place] [phone]   Eligibility Requirements You must have lived in [place], [place], or [place] counties for at least the last three months.   You cannot be eligible for state or federal sponsored [employer], including [employer], [place], or [employer].   You generally cannot be eligible for healthcare insurance through your employer.    How to apply: Eligibility screenings are held every Tuesday and Wednesday afternoon from 1:00 pm until 4:00 pm. You do not need an appointment for the interview!  [facility] [address], [place], [place]   [place] Department  [place]  [phone]    Behavioral Health Resources in the Community: Intensive Outpatient Programs Organization         Address  Phone  Notes  [place]. [address], [place], [place] [phone]   [facility] Outpatient [address] [place], [place]   ADS: [facility] Drug S[facility] [address] [place], [place]   [place] [address]  [place], [place] or [phone]   Substance Abuse Resources Organization         Address  Phone  Notes  Alcohol and Drug Services  [phone]   [place]  [phone]   [facility] [place]   Chinita Pester  [phone]   Residential & Outpatient Substance Abuse Program  [phone]   Psychological  Services Organization         Address  Phone  Notes  [facility] [place]  [place]  [phone]   [place] [address] [place] or [phone]    Mobile Crisis Teams Organization         Address  Phone  Notes  [facility], Mobile Crisis Care Unit  [phone]   [facility]  [address] [place], [place]   Ivin Booty  [place] [address] [place] [phone]    Self-Help/Support Groups Organization         Address  Phone             Notes  [facility] of [place] - variety of support groups  336- I7437963 Call for more information  Narcotics Anonymous (NA), [facility] [address], [employer] [place]  2 meetings at this location   Statistician         Address  Phone  Notes  [facility] [address],    [place]  [phone]   [facility]  [address] [place] [address], [place], [place] [phone]   [facility] [address] [place], [place] [phone] Admissions: 8am-3pm M-F  [facility] [address]    [place], [place] [phone]   [facility] [address] [place], [place], [place] [phone]   [facility] [address],  [place], [place] [phone]   [facility] - Intensive Outpatient [address] Laure[address], [place], [place] [phone]   [facility] (Addiction Recovery Care [facility] [address] [place].,  [place], [place] [phone] or [phone]   [facility] (RT[facility]) [address] [place], [place] [phone] Accepts Medicaid  [facility] [address],  [place] [phone] Substance Abuse/Addiction Treatment   [facility] Organization         Address  Phone  Notes  [facility]  [phone]   Angie Fava, PhD [address] Ervin Knack [place], [place]   [phone] or [phone]   [facility] Behavioral   [address] [place], [place] [phone]   [facility] [address], [place], [place] [phone] Insurance/Medicaid/sponsorship through [facility] and Families [address]                                    [place], [place] [phone] Therapy/tele-psych/case  [facility] [address], [place] [phone]    Dr. Lolly Mustache  [phone]   [facility] [place]  [employer] [facility] Dept. 1) [address], [place] 2) [address], [place] 3)  [address], [place] [phone]  [phone]   [facility] Child Abuse Hotline 516-135-2277 or [phone] (After Hours)

## 2013-04-12 NOTE — ED Notes (Signed)
Dr. Miller at the bedside.  

## 2013-05-20 ENCOUNTER — Emergency Department (HOSPITAL_COMMUNITY)
Admission: EM | Admit: 2013-05-20 | Discharge: 2013-05-20 | Disposition: A | Discharge status: Home / Self Care | Payer: Self-pay | Attending: Emergency Medicine | Admitting: Emergency Medicine

## 2013-05-20 ENCOUNTER — Encounter (HOSPITAL_COMMUNITY): Payer: Self-pay | Admitting: Emergency Medicine

## 2013-05-20 ENCOUNTER — Emergency Department (HOSPITAL_COMMUNITY): Payer: Self-pay

## 2013-05-20 DIAGNOSIS — R059 Cough, unspecified: Secondary | ICD-10-CM | POA: Insufficient documentation

## 2013-05-20 DIAGNOSIS — M79609 Pain in unspecified limb: Secondary | ICD-10-CM

## 2013-05-20 DIAGNOSIS — M79604 Pain in right leg: Secondary | ICD-10-CM

## 2013-05-20 DIAGNOSIS — R05 Cough: Secondary | ICD-10-CM | POA: Insufficient documentation

## 2013-05-20 DIAGNOSIS — R079 Chest pain, unspecified: Secondary | ICD-10-CM | POA: Insufficient documentation

## 2013-05-20 DIAGNOSIS — Z8709 Personal history of other diseases of the respiratory system: Secondary | ICD-10-CM | POA: Insufficient documentation

## 2013-05-20 DIAGNOSIS — IMO0001 Reserved for inherently not codable concepts without codable children: Secondary | ICD-10-CM | POA: Insufficient documentation

## 2013-05-20 DIAGNOSIS — R0602 Shortness of breath: Secondary | ICD-10-CM | POA: Insufficient documentation

## 2013-05-20 DIAGNOSIS — F17200 Nicotine dependence, unspecified, uncomplicated: Secondary | ICD-10-CM | POA: Insufficient documentation

## 2013-05-20 LAB — CBC
HCT: 43 % (ref 39.0–52.0)
Hemoglobin: 14.4 g/dL (ref 13.0–17.0)
MCH: 28.8 pg (ref 26.0–34.0)
MCHC: 33.5 g/dL (ref 30.0–36.0)
MCV: 86 fL (ref 78.0–100.0)
PLATELETS: 229 10*3/uL (ref 150–400)
RBC: 5 MIL/uL (ref 4.22–5.81)
RDW: 14.4 % (ref 11.5–15.5)
WBC: 6.2 10*3/uL (ref 4.0–10.5)

## 2013-05-20 LAB — BASIC METABOLIC PANEL
BUN: 10 mg/dL (ref 6–23)
CALCIUM: 9.1 mg/dL (ref 8.4–10.5)
CO2: 25 mEq/L (ref 19–32)
CREATININE: 1.09 mg/dL (ref 0.50–1.35)
Chloride: 104 mEq/L (ref 96–112)
GFR calc non Af Amer: 79 mL/min — ABNORMAL LOW (ref >90)
Glucose, Bld: 152 mg/dL — ABNORMAL HIGH (ref 70–99)
Potassium: 4 mEq/L (ref 3.7–5.3)
Sodium: 143 mEq/L (ref 137–147)

## 2013-05-20 LAB — I-STAT TROPONIN, ED
TROPONIN I, POC: 0 ng/mL (ref 0.00–0.08)
Troponin i, poc: 0 ng/mL (ref 0.00–0.08)

## 2013-05-20 MED ORDER — OXYCODONE-ACETAMINOPHEN 5-325 MG PO TABS: 1.0000 | ORAL_TABLET | Freq: Four times a day (QID) | ORAL | Status: DC | PRN

## 2013-05-20 MED ORDER — FENTANYL CITRATE 0.05 MG/ML IJ SOLN
50.0000 ug | Freq: Once | INTRAMUSCULAR | Status: AC
  Administered 2013-05-20: 50 ug via INTRAVENOUS
  Filled 2013-05-20: qty 2

## 2013-05-20 MED ORDER — IPRATROPIUM-ALBUTEROL 0.5-2.5 (3) MG/3ML IN SOLN
3.0000 mL | Freq: Once | RESPIRATORY_TRACT | Status: AC
  Administered 2013-05-20: 3 mL via RESPIRATORY_TRACT
  Filled 2013-05-20: qty 3

## 2013-05-20 MED ORDER — HYDROMORPHONE HCL PF 1 MG/ML IJ SOLN: 1.0000 mg | Freq: Once | INTRAMUSCULAR | Status: DC

## 2013-05-20 NOTE — ED Provider Notes (Signed)
  Face-to-face evaluation   History: He complains of left chest wall pain, for one year. That is intermittent and feels like "a catch." He also has right leg pain. That started yesterday without trauma. He is concerned. He is an infection in his foot from a toenail problem.  Physical exam: Alert, calm, cooperative. Heart regular rate and rhythm, no murmur. Lungs clear to auscultation. Chest mild left lateral tenderness, without crepitation or deformity. Right leg, diffusely tender from the shin, calf, and foot. No deformities. No significant swelling as compared to the left. 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   Medical screening examination/treatment/procedure(s) were conducted as a shared visit with non-physician practitioner(s) and myself.  I personally evaluated the patient during the encounter  Richarda Blade, MD 05/21/13 [phone]

## 2013-05-20 NOTE — Discharge Instructions (Signed)
°Emergency Department Resource Guide °1) Find a Doctor and Pay Out of Pocket °Although you won't have to find out who is covered by your insurance plan, it is a good idea to ask around and get recommendations. You will then need to call the office and see if the doctor you have chosen will accept you as a new patient and what types of options they offer for patients who are self-pay. Some doctors offer discounts or will set up payment plans for their patients who do not have insurance, but you will need to ask so you aren't surprised when you get to your appointment. ° °2) Contact Your Local Health Department °Not all health departments have doctors that can see patients for sick visits, but many do, so it is worth a call to see if yours does. If you don't know where your local health department is, you can check in your phone book. The CDC also has a tool to help you locate your state's health department, and many state websites also have listings of all of their local health departments. ° °3) Find a Walk-in Clinic °If your illness is not likely to be very severe or complicated, you may want to try a walk in clinic. These are popping up all over the country in pharmacies, drugstores, and shopping centers. They're usually staffed by nurse practitioners or physician assistants that have been trained to treat common illnesses and complaints. They're usually fairly quick and inexpensive. However, if you have serious medical issues or chronic medical problems, these are probably not your best option. ° °No Primary Care Doctor: °- Call [facility] at  832-8000 - they can help you locate a primary care doctor that  accepts your insurance, provides certain services, etc. °- Physician Referral Service- 1-800-533-3463 ° °Chronic Pain Problems: °Organization         Address  Phone   Notes  °[facility]  (336) 297-2271 Patients need to be referred by their primary care doctor.  ° °Medication  Assistance: °Organization         Address  Phone   Notes  °[place] [facility] [address] °[place], [place] [address] (336) 641-8030 --Must be a resident of [place] °-- Must have NO insurance coverage whatsoever (no Medicaid/ Medicare, etc.) °-- The pt. MUST have a primary care doctor that directs their care regularly and follows them in the community °  °[facility]  (866) 331-1348   °[employer]  (888) 892-1162   ° °Agencies that provide inexpensive medical care: °Organization         Address  Phone   Notes  °[facility]  (336) 832-8035   °[facility]    (336) 832-7272   °[facility] [address] °[place], [place] [address] (336) 832-4777   °[facility] [address], °[place] (336) 271-4999   °[facility]    (336) 373-0678   °[facility]    (336) 272-1050   °[facility] ° [address], [place] Phone:  (336) 832-4444, Fax:  (336) 832-4440 Hours of Operation:  9 am - 6 pm, M-F.  Also accepts Medicaid/Medicare and self-pay.  °[facility] ° [address], [place] Phone: (336) 832-3150, Fax: (336) [phone]. Hours of Operation:  8:30 am - 5:30 pm, M-F.  Also accepts Medicaid and self-pay.  °[facility] [place] [address], [place] Phone: (336) 878-6027   °[facility] [address], [place], [place] (336)723-1848, Ext. 123 Mondays & Thursdays: 7-9 AM.  First 15 patients are seen on a first come, first serve basis. °  ° °Medicaid-accepting [place] Providers: ° °Organization         Address  Phone   Notes  °[facility] [address], [place] (336) 641-2100 Also accepts self-pay patients.  °[facility] [address], [place] ° (336) 856-9996   °[facility] [address], [place]  (336) 288-8857   °[facility] [address] [place] [address], [place] (336) 299-7000   °Veita Bland [address], [place]  ° (336) 373-1557 Only accepts [place] Access Medicaid patients after they have their name applied to their card.  ° °Self-Pay (no insurance) in [place]: ° °Organization         Address  Phone   Notes  °Sickle Cell Patients, [facility] [address], [place] (336) 832-1970   °[facility] [address], [place] (336) 832-4400   ° [facility] ° [address], [place] (336) 992-4800   °[facility]/Dr. Osei-Bonsu ° [address] [place] [address], [place] or [address], [place] (336) [phone] Phone number for both [place] and [place] locations is the same.  °[facility] [address], [place] (336) 299-0000   °[facility] [address] [place] [address], [place] or [address] (336) 852-7530 °(336) 878-2260   °[facility] [address], [place] (336) 350-1642, phone; (336) 294-5005, fax Sees patients 1st and 3rd Saturday of every month.  Must not qualify for public or private insurance (i.e. Medicaid, Medicare, [place] Health Choice, Veterans' Benefits) • Household income should be no more than 200% of the poverty level •The clinic cannot treat you if you are pregnant or think you are pregnant • Sexually transmitted diseases are not treated at the clinic.  ° ° °Dental Care: °Organization         Address  Phone  Notes  °[place] [facility] [address], [place] (336) 641-6152 Accepts children up to age 21 who are enrolled in Medicaid or [place] Health Choice; pregnant women with a Medicaid card; and children who have applied for Medicaid or [place] Health Choice, but were declined, whose parents can pay a reduced fee at time of service.  °[place]  [facility] [place]  [address], [place] (336) 641-7733 Accepts children up to age 21 who are enrolled in Medicaid or [place] Health Choice; pregnant women with a Medicaid card; and children who have applied for Medicaid or  Health Choice, but were declined, whose parents can pay a reduced fee at time of service.  °[facility] ° [address], [place] (336) 641-4533 Patients are seen by appointment only. Walk-ins are not accepted. [facility] will see patients 18 years of age and older. °Monday - Tuesday (8am-5pm) °Most Wednesdays (8:30-5pm) °$30 per visit, cash only  °[facility] ° [address], [place] (336) 641-4533 Patients are seen by appointment only. Walk-ins are not accepted. [facility] will see patients 18 years of age and older. °One   Wednesday Evening (Monthly: Volunteer Based).  $30 per visit, cash only  °[facility]  (919) 537-3737 for adults; Children under age 4, call Graduate Pediatric Dentistry at (919) [phone]. Children aged 4-14, please call (919) [phone] to request a pediatric application. ° Dental services are provided in all areas of dental care including fillings, crowns and bridges, complete and partial dentures, implants, gum treatment, root canals, and extractions. Preventive care is also provided. Treatment is provided to both adults and children. °Patients are selected via a lottery and there is often a waiting list. °  °[facility] [address], °[place] ° (336) 763-8833 [email or web address] °  °[facility] [address], [place], [place] (336)723-1848, Ext. 123 Second and Fourth Thursday of each month, opens at 6:30 AM; Clinic ends at 9 AM.  Patients are seen on a first-come first-served basis, and a limited number are seen during each clinic.  ° °[facility] ° [address], [place], [place] (336) 723-7904    Eligibility Requirements °You must have lived in [place], [place], or [place] counties for at least the last three months. °  You cannot be eligible for state or federal sponsored healthcare insurance, including Veterans Administration, Medicaid, or Medicare. °  You generally cannot be eligible for healthcare insurance through your employer.  °  How to apply: °Eligibility screenings are held every Tuesday and Wednesday afternoon from 1:00 pm until 4:00 pm. You do not need an appointment for the interview!  °[facility] [address], [place], [place] 336-631-2330   °[facility] [facility]  336-342-8273   °[facility]  336-703-3100   °[facility]  336-570-6415   ° °Behavioral Health Resources in the Community: °Intensive Outpatient Programs °Organization         Address  Phone  Notes  °[facility] [address], [place], [place] 336-878-6098   °[facility] [address], [place], [place] 336-832-9800   °[facility] [address], [place], [place] ° 336-882-2125   °[facility] [address],  °[place], [place] 1-800-853-5163 or 336-641-4981   °Substance Abuse Resources °Organization         Address  Phone  Notes  °[facility]  336-882-2125   °[facility]  336-784-9470   °[facility]  336-285-9073   °[facility]  336-845-3988   °Residential & Outpatient Substance Abuse Program  1-800-659-3381   °Psychological Services °Organization         Address  Phone  Notes  °[facility]  336- 832-9600   °[facility]  336- 378-7881   °[facility] [address], [place] 1-800-853-5163 or 336-641-4981   ° °Mobile Crisis Teams °Organization         Address  Phone  Notes  °[facility], Mobile Crisis Care Unit  1-877-626-1772   °Assertive °[facility] ° [address]  [place], [place] 336-834-9664   °Sharon DeEsch [address] °[place] 336-554-5454   ° °Self-Help/Support Groups °Organization         Address  Phone             Notes  °[facility]  - variety of support groups  336- 373-1402 Call for more information  °Narcotics Anonymous (NA), [facility] [address], °[place]  2 meetings at this location  ° °  Residential Treatment Programs °Organization         Address  Phone  Notes  °[facility] [address],    °[place]  1-866-801-8205   °[facility] ° [address], [place], [place] 704-293-8524   °[facility] [address], [place] 336-845-3988 Admissions: 8am-3pm M-F  °[facility] [address]    °[place], [place] 336-841-1104   °[facility] [address], [place], [place] 336-379-7146   °[facility] [address]  °[place], [place] 336-285-9073   °[facility] - Intensive Outpatient [address], [place], [place] 336-852-3033   °[facility] [address]  °[place], [place] 1-877-615-2722 or 336-784-9470   °[facility] [address] [place], [place] 336-227-7417 Accepts Medicaid  °[facility] [address]  °[place] 1-800-659-3381 Substance Abuse/Addiction Treatment  ° °[place] Behavioral Health Resources °Organization         Address  Phone  Notes  °[facility]  (888) 581-9988   °Julie Brannon, PhD [address] [place], [place]   (336) 349-5553 or (336) 951-0000   °[facility]   [address] °[place], [place] (336) 349-4454   °[facility] [address], [place], [place] (336) 342-8316 Insurance/Medicaid/sponsorship through [facility]  °[facility] [address]                                    [place], [place] (336) 342-8316 Therapy/tele-psych/case    °[facility] [address]  ° [place], [place] (336) 349-2233    °Dr. Arfeen  (336) 349-4544   °[facility] [place]  [employer] [place] [facility] 1) [address], [place] °2) [address], [place] °3)  [address], [place] (336) 349-3220 °(336) 342-7768 ° °(336) 342-8140   °[place] [facility] (336) 342-1394 or (336) 342-3537 (After Hours)    ° ° °

## 2013-05-20 NOTE — ED Notes (Signed)
Pt c/o center chest pain that radiates to left chest onset last night while at work. Pt reports pain started while bending forward. Pt also c/o pain to back of right knee that radiates around and down to his foot. Pt presents with slight swelling to right calf/leg.

## 2013-05-20 NOTE — Progress Notes (Signed)
*  Preliminary Results* Right lower extremity venous duplex completed. Right lower extremity is negative for deep vein thrombosis. There is no evidence of right Baker's cyst.  05/20/2013 1:41 PM  Maudry Mayhew, RVT, RDCS, RDMS

## 2013-05-20 NOTE — ED Provider Notes (Signed)
CSN: [id number]     Arrival date & time 05/20/13  1003 History   First MD Initiated Contact with Patient 05/20/13 1300     Chief Complaint  Patient presents with  . Chest Pain  . Leg Pain     (Consider location/radiation/quality/duration/timing/severity/associated sxs/prior Treatment) HPI Pt is a 48yo male with hx of bronchitis presenting to ED c/o chest pain and right leg pain that started last night at work.  Pt states pain started while bending over at forward at work. Chest pain is centralized constant, "tender" sensation, 9/10, worse with palpation and certain movements. Nothing makes pain better.  Pt also c/o right knee pain that radiates down into anterior and posterior leg, into foot. Leg pain is constant, aching, 9/10, worse with palpation.  Pt also states he has a thickened yellow toe nail of his right great toe that has been present for about 1 year and is concerned he has a fungal infection in his leg. Has taken ibuprofen w/o relief. Denies fever, n/v/d.  Denies hx of DM. Denies known CAD but does report his mom c/o chest pain at times but is unsure what caused her chest pain.  Denies hx of asthma but reports hx of bronchitis and was an active smoker until 3 weeks ago.  Denies hx of PE or DVT. No recent travel.   Past Medical History  Diagnosis Date  . Bronchitis    History reviewed. No pertinent past surgical history. No family history on file. History  Substance Use Topics  . Smoking status: Current Every Day Smoker -- 0.50 packs/day    Types: Cigarettes  . Smokeless tobacco: Never Used  . Alcohol Use: No    Review of Systems  Constitutional: Negative for fever, chills and fatigue.  Respiratory: Positive for cough and shortness of breath ( mild intermittent).   Cardiovascular: Positive for chest pain. Negative for palpitations and leg swelling.  Gastrointestinal: Negative for nausea, vomiting, abdominal pain and diarrhea.  Musculoskeletal: Positive for arthralgias (  right knee) and myalgias ( right lower leg pain). Negative for back pain.  Skin: Negative for color change, rash and wound.  All other systems reviewed and are negative.     Allergies  Tramadol and Vicodin  Home Medications   Prior to Admission medications   Medication Sig Start Date End Date Taking? Authorizing Provider  naproxen (NAPROSYN) 500 MG tablet Take 500 mg by mouth 2 (two) times daily as needed (pain).   Yes Historical Provider, MD   BP 134/91  Pulse 82  Temp(Src) 97.9 F (36.6 C) (Oral)  Resp 21  Ht 5\' 9"  (1.753 m)  Wt 236 lb (107.049 kg)  BMI 34.84 kg/m2  SpO2 95% Physical Exam  Nursing note and vitals reviewed. Constitutional: He appears well-developed and well-nourished.  Pt lying comfortably in exam bed, NAD.   HENT:  Head: Normocephalic and atraumatic.  Eyes: Conjunctivae are normal. No scleral icterus.  Neck: Normal range of motion. Neck supple.  Cardiovascular: Normal rate, regular rhythm and normal heart sounds.   Pulses:      Dorsalis pedis pulses are 2+ on the right side, and 2+ on the left side.       Posterior tibial pulses are 2+ on the right side, and 2+ on the left side.  Pulmonary/Chest: Effort normal. No respiratory distress. He has wheezes. He has no rales. He exhibits tenderness ( centralized and left sided ).  No respiratory distress, able to speak in full sentences w/o difficulty. Lungs: diffuse  inspiratory and expiratory wheeze  Abdominal: Soft. Bowel sounds are normal. He exhibits no distension and no mass. There is no tenderness. There is no rebound and no guarding.  Musculoskeletal: Normal range of motion. He exhibits tenderness ( right lower leg and calf). He exhibits no edema.  Neurological: He is alert.  Skin: Skin is warm and dry. No erythema.  Skin in tact. No ecchymosis, erythema, or warmth. No red streaking, induration, or evidence of underlying infection.    ED Course  Procedures (including critical care time) Labs  Review Labs Reviewed  BASIC METABOLIC PANEL - Abnormal; Notable for the following:    Glucose, Bld 152 (*)    GFR calc non Af Amer 79 (*)    All other components within normal limits  CBC  I-STAT TROPOININ, ED  I-STAT TROPOININ, ED    Imaging Review Dg Chest 2 View (if Patient Has Fever And/or Copd)  05/20/2013   CLINICAL DATA:  Chest pain and cough  EXAM: CHEST  2 VIEW  COMPARISON:  DG CHEST 2 VIEW dated 04/12/2013  FINDINGS: The heart size and mediastinal contours are within normal limits. Both lungs are clear. The visualized skeletal structures are unremarkable.  IMPRESSION: No active cardiopulmonary disease.   Electronically Signed   By: Conchita Paris M.D.   On: 05/20/2013 10:57     EKG Interpretation None      MDM   Final diagnoses:  Chest pain  Right leg pain    Pt is a 48yo male c/o chest pain and right leg pain that started last night while pt was at work.  Pt states pain started while bending forward at work.  Pt is PERC negative. Denies personal hx of CAD, possible FH of CAD.  Pt given fentanyl for pain which did help improve chest and leg pain.   On exam, pt appears well, no acute distress. Lungs: inspiratory and expiratory wheeze.  This did improve with duoneb tx in ED. Lungs-CTAB after tx. Pt does have chest wall tenderness as well as right leg tenderness w/o evidence of underlying infection or swelling of leg.  Venous doppler negative for DVT   Labs: CBC, BMP, istat troponin x2- negative CXR: no active cardiopulmonary disease.   Discussed pt with Dr. Eulis Foster who also examined pt.  Pt then stated he had been having these pains intermittently x1 year.  Not concerned for ACS or PE. Not concerned for emergent process taking place at this time. Pt may be discharged home with pain medication, percocet as he reports being allergic to hydrocodone.  Strongly advised to establish care with a primary care provided for ongoing healthcare needs. Return precautions provided. Pt  verbalized understanding and agreement with tx plan.         Noland Fordyce, PA-C 05/20/13 [place], MD 05/21/13 [phone]

## 2013-05-20 NOTE — Discharge Planning (Signed)
D6UY Felicia E, Community Liaison  Spoke to patient about primary care resources and establishing care with a provider. Patient was given the orange card application and a resource guide. Patient was instructed to contact me once his application was complete to schedule an appointment to obtain the orange card.

## 2013-09-24 ENCOUNTER — Emergency Department (HOSPITAL_COMMUNITY)
Admission: EM | Admit: 2013-09-24 | Discharge: 2013-09-24 | Disposition: A | Discharge status: Home / Self Care | Payer: Self-pay | Attending: Emergency Medicine | Admitting: Emergency Medicine

## 2013-09-24 ENCOUNTER — Emergency Department (HOSPITAL_COMMUNITY): Payer: Self-pay

## 2013-09-24 ENCOUNTER — Encounter (HOSPITAL_COMMUNITY): Payer: Self-pay | Admitting: Emergency Medicine

## 2013-09-24 DIAGNOSIS — Z8709 Personal history of other diseases of the respiratory system: Secondary | ICD-10-CM | POA: Insufficient documentation

## 2013-09-24 DIAGNOSIS — R079 Chest pain, unspecified: Secondary | ICD-10-CM | POA: Insufficient documentation

## 2013-09-24 DIAGNOSIS — R748 Abnormal levels of other serum enzymes: Secondary | ICD-10-CM | POA: Insufficient documentation

## 2013-09-24 DIAGNOSIS — IMO0001 Reserved for inherently not codable concepts without codable children: Secondary | ICD-10-CM | POA: Insufficient documentation

## 2013-09-24 DIAGNOSIS — M791 Myalgia, unspecified site: Secondary | ICD-10-CM

## 2013-09-24 DIAGNOSIS — F17200 Nicotine dependence, unspecified, uncomplicated: Secondary | ICD-10-CM | POA: Insufficient documentation

## 2013-09-24 LAB — CK: CK TOTAL: 760 U/L — AB (ref 7–232)

## 2013-09-24 LAB — CBC
HCT: 40 % (ref 39.0–52.0)
HEMOGLOBIN: 13.5 g/dL (ref 13.0–17.0)
MCH: 28.1 pg (ref 26.0–34.0)
MCHC: 33.8 g/dL (ref 30.0–36.0)
MCV: 83.3 fL (ref 78.0–100.0)
Platelets: 238 10*3/uL (ref 150–400)
RBC: 4.8 MIL/uL (ref 4.22–5.81)
RDW: 14.3 % (ref 11.5–15.5)
WBC: 5.8 10*3/uL (ref 4.0–10.5)

## 2013-09-24 LAB — BASIC METABOLIC PANEL
Anion gap: 13 (ref 5–15)
BUN: 9 mg/dL (ref 6–23)
CO2: 25 mEq/L (ref 19–32)
CREATININE: 1.13 mg/dL (ref 0.50–1.35)
Calcium: 9.1 mg/dL (ref 8.4–10.5)
Chloride: 105 mEq/L (ref 96–112)
GFR calc Af Amer: 87 mL/min — ABNORMAL LOW (ref >90)
GFR, EST NON AFRICAN AMERICAN: 75 mL/min — AB (ref >90)
GLUCOSE: 108 mg/dL — AB (ref 70–99)
POTASSIUM: 3.7 meq/L (ref 3.7–5.3)
Sodium: 143 mEq/L (ref 137–147)

## 2013-09-24 LAB — I-STAT TROPONIN, ED: Troponin i, poc: 0 ng/mL (ref 0.00–0.08)

## 2013-09-24 MED ORDER — SODIUM CHLORIDE 0.9 % IV BOLUS (SEPSIS)
1000.0000 mL | Freq: Once | INTRAVENOUS | Status: AC
  Administered 2013-09-24: 1000 mL via INTRAVENOUS

## 2013-09-24 NOTE — ED Notes (Signed)
Pt stated, I have pain and swelling all over and need an xray all over.

## 2013-09-24 NOTE — ED Notes (Addendum)
IV fluids not completed-about 300 ml remaining

## 2013-09-24 NOTE — ED Provider Notes (Signed)
CSN: [id number]     Arrival date & time 09/24/13  0941 History   First MD Initiated Contact with Patient 09/24/13 [phone]     Chief Complaint  Patient presents with  . Chest Pain     (Consider location/radiation/quality/duration/timing/severity/associated sxs/prior Treatment) HPI Pt presenting with c/o generalized body pain.  He states the pain has been going on for several weeks.  States the pain is in his arms and legs and feels like a grinding pain.  He also states he has generalized pain throughout his body- including his chest.  No leg swelling.  No shortness of breath, nausea or diaphoresis.  He states his knees hurt sometimes when he walks.  No fever/chills.  Symptoms have been constant for several weeks.  Pt is concerned because several people where he lives have rheumatoid arthritis.  There are no other associated systemic symptoms, there are no other alleviating or modifying factors.   Past Medical History  Diagnosis Date  . Bronchitis   . Bronchitis    History reviewed. No pertinent past surgical history. History reviewed. No pertinent family history. History  Substance Use Topics  . Smoking status: Current Every Day Smoker -- 0.50 packs/day    Types: Cigarettes  . Smokeless tobacco: Never Used  . Alcohol Use: No    Review of Systems ROS reviewed and all otherwise negative except for mentioned in HPI    Allergies  Tramadol and Vicodin  Home Medications   Prior to Admission medications   Not on File   BP 118/86  Pulse 69  Temp(Src) 98 F (36.7 C) (Oral)  Resp 15  SpO2 94% Vitals reviewed Physical Exam Physical Examination: General appearance - alert, well appearing, and in no distress Mental status - alert, oriented to person, place, and time Eyes - no conjunctival injection, no scleral icterus Mouth - mucous membranes moist, pharynx normal without lesions Chest - clear to auscultation, no wheezes, rales or rhonchi, symmetric air entry Heart - normal rate,  regular rhythm, normal S1, S2, no murmurs, rubs, clicks or gallops Abdomen - soft, nontender, nondistended, no masses or organomegaly Extremities - peripheral pulses normal, no pedal edema, no clubbing or cyanosis Skin - normal coloration and turgor, no rashes  ED Course  Procedures (including critical care time) Labs Review Labs Reviewed  BASIC METABOLIC PANEL - Abnormal; Notable for the following:    Glucose, Bld 108 (*)    GFR calc non Af Amer 75 (*)    GFR calc Af Amer 87 (*)    All other components within normal limits  CK - Abnormal; Notable for the following:    Total CK 760 (*)    All other components within normal limits  CBC  I-STAT TROPOININ, ED    Imaging Review Dg Chest 2 View  09/24/2013   CLINICAL DATA:  Several week history of chest pain and right leg swelling  EXAM: CHEST  2 VIEW  COMPARISON:  PA and lateral chest x-ray of May 20, 2013  FINDINGS: The lungs are well-expanded. The interstitial markings are coarse but stable. There is no alveolar infiltrate. There is no pleural effusion or pneumothorax. The heart and pulmonary vascularity are normal. There is mild tortuosity of the descending thoracic aorta. The mediastinum is normal in width. The bony thorax is unremarkable.  IMPRESSION: There is no acute cardiopulmonary abnormality. Coarse interstitial lung markings may reflect the patient's smoking history. One cannot exclude acute bronchitis in the appropriate clinical setting.   Electronically Signed   By:  David  Martinique   On: 09/24/2013 11:32      Date/Time:  Saturday September 24 2013 09:43:10 EDT Ventricular Rate:  85 PR Interval:  156 QRS Duration: 102 QT Interval:  382 QTC Calculation: 454 R Axis:   122 Text Interpretation:   Suspect arm lead reversal, interpretation  assumes no reversal Unusual P axis, possible ectopic atrial rhythm Left  posterior fascicular block Cannot rule out Inferior infarct , age  undetermined Abnormal ECG No significant change  since last tracing  Confirmed by [facility]  MD, MARTHA [phone]) on 09/25/2013 10:39:26 AM MDM   Final diagnoses:  Myalgia  Elevated CK    Pt found to have mild elevation of CK- given IV fluids, this may be the cause of his diffuse pain.  Low suspicion for ACS or other acute emergent process at this time.  I have discussed with patient the importance of getting a primary care doctor.  Per prior records he was given access to orange card through case management as well as resources.  Discharged with strict return precautions.  Pt agreeable with plan.   Xray images reviewed and interpreted by me as well.  Prior records reviewed and considered during this visit Nursing notes including past medical history and social history reviewed and considered in documentation    Threasa Beards, MD 09/25/13 1040

## 2013-09-24 NOTE — ED Notes (Signed)
Pt. Requesting something for pain. stated, I hurt all over.

## 2013-09-24 NOTE — ED Notes (Signed)
Pt here for cp for several weeks, describes as a grinding pain.

## 2013-09-24 NOTE — ED Notes (Signed)
Also complains of joint pain in bilateral knees

## 2013-09-24 NOTE — Discharge Instructions (Signed)
Return to the ED with any concerns including worsening pain, difficulty breathing, vomiting and not able to keep down liquids, decreased level of alertness/lethargy, or any other alarming symptoms  You should be sure to increase your fluid intake

## 2013-09-30 ENCOUNTER — Encounter (HOSPITAL_COMMUNITY): Payer: Self-pay | Admitting: Emergency Medicine

## 2013-09-30 ENCOUNTER — Emergency Department (HOSPITAL_COMMUNITY)
Admission: EM | Admit: 2013-09-30 | Discharge: 2013-09-30 | Disposition: A | Discharge status: Home / Self Care | Payer: Self-pay | Attending: Emergency Medicine | Admitting: Emergency Medicine

## 2013-09-30 DIAGNOSIS — M25519 Pain in unspecified shoulder: Secondary | ICD-10-CM | POA: Insufficient documentation

## 2013-09-30 DIAGNOSIS — M79609 Pain in unspecified limb: Secondary | ICD-10-CM | POA: Insufficient documentation

## 2013-09-30 DIAGNOSIS — Z8709 Personal history of other diseases of the respiratory system: Secondary | ICD-10-CM | POA: Insufficient documentation

## 2013-09-30 DIAGNOSIS — M109 Gout, unspecified: Secondary | ICD-10-CM | POA: Insufficient documentation

## 2013-09-30 DIAGNOSIS — F17200 Nicotine dependence, unspecified, uncomplicated: Secondary | ICD-10-CM | POA: Insufficient documentation

## 2013-09-30 MED ORDER — COLCHICINE 0.6 MG PO TABS: ORAL_TABLET | ORAL | Status: DC

## 2013-09-30 MED ORDER — COLCHICINE 0.6 MG PO TABS
1.2000 mg | ORAL_TABLET | Freq: Once | ORAL | Status: AC
Start: 2013-09-30 — End: 2013-09-30
  Administered 2013-09-30: 1.2 mg via ORAL
  Filled 2013-09-30: qty 2

## 2013-09-30 NOTE — ED Notes (Signed)
Patient states that he has "electronic pain" in his left knee and his right shoulder. Patient was seen at [facility] on 09/24/2013 for same. Patient states he was told to increase his water intake and take ibuprofen for his pain. Patient states he increased his water intake but has not taken any ibuprofen. Patient states he needs something stronger. Patient denies any additional follow up or seeking a PCP. Patient states the pain is "so bad I can't work, I can't even stand, all I can do is hop".

## 2013-09-30 NOTE — Discharge Instructions (Signed)
Please call your doctor for a followup appointment within 24-48 hours. When you talk to your doctor please let them know that you were seen in the emergency department and have them acquire all of your records so that they can discuss the findings with you and formulate a treatment plan to fully care for your new and ongoing problems. ° ° °Emergency Department Resource Guide °1) Find a Doctor and Pay Out of Pocket °Although you won't have to find out who is covered by your insurance plan, it is a good idea to ask around and get recommendations. You will then need to call the office and see if the doctor you have chosen will accept you as a new patient and what types of options they offer for patients who are self-pay. Some doctors offer discounts or will set up payment plans for their patients who do not have insurance, but you will need to ask so you aren't surprised when you get to your appointment. ° °2) Contact Your Local Health Department °Not all health departments have doctors that can see patients for sick visits, but many do, so it is worth a call to see if yours does. If you don't know where your local health department is, you can check in your phone book. The CDC also has a tool to help you locate your state's health department, and many state websites also have listings of all of their local health departments. ° °3) Find a Walk-in Clinic °If your illness is not likely to be very severe or complicated, you may want to try a walk in clinic. These are popping up all over the country in pharmacies, drugstores, and shopping centers. They're usually staffed by nurse practitioners or physician assistants that have been trained to treat common illnesses and complaints. They're usually fairly quick and inexpensive. However, if you have serious medical issues or chronic medical problems, these are probably not your best option. ° °No Primary Care Doctor: °- Call [facility] at  832-8000 - they can help you  locate a primary care doctor that  accepts your insurance, provides certain services, etc. °- Physician Referral Service- 1-800-533-3463 ° °Chronic Pain Problems: °Organization         Address  Phone   Notes  °[facility]  ([phone]) [phone] Patients need to be referred by their primary care doctor.  ° °Medication Assistance: °Organization         Address  Phone   Notes  °[place] [facility] [address] °[place], [place] [address] ([phone]) 641-8030 --Must be a resident of [place] °-- Must have NO insurance coverage whatsoever (no Medicaid/ Medicare, etc.) °-- The pt. MUST have a primary care doctor that directs their care regularly and follows them in the community °  °[facility]  (866) 331-1348   °[employer]  (888) 892-1162   ° °Agencies that provide inexpensive medical care: °Organization         Address  Phone   Notes  °[facility]  ([phone]) [phone]   °[facility]    (336) 832-7272   °[facility] [address] °[place],  [address] (336) 832-4777   °[facility] [address], °[place] (336) 271-4999   °[facility]    (336) 373-0678   °[facility]    (336) 272-1050   °[facility] ° [address], [place] Phone:  (336)   832-4444, Fax:  (336) 832-4440 Hours of Operation:  9 am - 6 pm, M-F.  Also accepts Medicaid/Medicare and self-pay.  °[facility] ° [address], [place] Phone: (336) [phone], Fax: (336) [phone]. Hours of Operation:  8:30 am - 5:30 pm, M-F.  Also accepts Medicaid and self-pay.  °[facility] [place] [address], [place] Phone: (336) 878-6027   °[facility] [address], [place], [place] (336)723-1848, Ext. 123 Mondays & Thursdays: 7-9 AM.  First 15 patients are seen on a first come, first serve basis. °  ° °Medicaid-accepting [place]  Providers: ° °Organization         Address  Phone   Notes  °[facility] [address], [place] ([phone]) [phone] Also accepts self-pay patients.  °[facility] [address], [place] ° (336) [phone]   °[facility] [address], [place] (336) [phone]   °[facility] [address] [place] [address], [place] (336) [phone]   °[facility] [address], [place]  ° (336) [phone] Only accepts [place] Access Medicaid patients after they have their name applied to their card.  ° °Self-Pay (no insurance) in [place]: ° °Organization         Address  Phone   Notes  °Sickle Cell Patients, [facility] [address], [place] (336) [phone]   °[facility] [address], [place] (336) [phone]   °[facility] ° [address], [place] (336) [phone]   °[facility]/Dr. Osei-Bonsu ° [address] [place] [address], [place] or [address], [place] ([phone]) [phone] Phone number for both [place] and [place] locations is the same.  °[facility] [address], [place] (336) [phone]   °[facility] [address] [place] [address], [place] or [address] (336) 852-7530 °(336) 878-2260   °[facility] [address],  (336) 350-1642, phone; (336) [phone], fax Sees patients 1st and 3rd Saturday of every month.  Must not qualify for public or private insurance (i.e. Medicaid, Medicare,  Health Choice, Veterans' Benefits) • Household income should be no more than 200% of the poverty level •The clinic cannot treat you if you are pregnant or think you are pregnant • Sexually transmitted diseases are not treated at the clinic.  ° ° °

## 2013-09-30 NOTE — ED Notes (Addendum)
Per EMS, patient had sudden onset left foot and right shoulder pain starting at 1700 last night. Patient states that he finished his work shift but now the pain has become unbearable. Patient denies trauma or injury.

## 2013-09-30 NOTE — ED Provider Notes (Signed)
CSN: [id number]     Arrival date & time 09/30/13  0248 History   First MD Initiated Contact with Patient 09/30/13 0408     Chief Complaint  Patient presents with  . Foot Pain    left  . Shoulder Pain    right     (Consider location/radiation/quality/duration/timing/severity/associated sxs/prior Treatment) HPI Comments: 48 year old male, history of bronchitis, history of frequent pain in the knee and the shoulder who presents with a complaint of left foot pain which is severe and has occurred over the last couple of days. He has had pains in his right shoulder over the last couple of weeks, denies any other acute symptoms including fevers chills or rashes. This pain is worse with walking and feels like an electric pain shooting from his foot up his leg  Patient is a 48 y.o. male presenting with lower extremity pain and shoulder pain. The history is provided by the patient.  Foot Pain  Shoulder Pain    Past Medical History  Diagnosis Date  . Bronchitis   . Bronchitis    History reviewed. No pertinent past surgical history. History reviewed. No pertinent family history. History  Substance Use Topics  . Smoking status: Current Every Day Smoker -- 0.50 packs/day    Types: Cigarettes  . Smokeless tobacco: Never Used  . Alcohol Use: No    Review of Systems  Constitutional: Negative for fever.  Musculoskeletal: Positive for joint swelling.  Skin: Negative for color change, rash and wound.      Allergies  Tramadol and Vicodin  Home Medications   Prior to Admission medications   Medication Sig Start Date End Date Taking? Authorizing Provider  colchicine 0.6 MG tablet Take 0.6mg  (one tablet) by mouth every 1-2 hours until one of the following occurs: 1.  The pain is gone 2.  The maximum dose has been given ( no more than 3 tabs in 3 hours or 10 tabs in 24 hours) 3.  The side effects outweight the benefits 09/30/13   Johnna Acosta, MD   BP 143/89  Pulse 76  Temp(Src)  98.6 F (37 C) (Oral)  Resp 20  SpO2 98% Physical Exam  Nursing note and vitals reviewed. Constitutional: He appears well-developed and well-nourished. No distress.  HENT:  Head: Normocephalic and atraumatic.  Eyes: Conjunctivae are normal. No scleral icterus.  Cardiovascular: Normal rate, regular rhythm and intact distal pulses.   Pulmonary/Chest: Effort normal and breath sounds normal.  Musculoskeletal: He exhibits tenderness ( Tender to palpation over the midfoot on the left as well as any plantar fascia, no redness, mild swelling and warmth of the foot). He exhibits no edema.  Neurological: He is alert.  Skin: Skin is warm and dry. No rash noted. He is not diaphoretic.    ED Course  Procedures (including critical care time) Labs Review Labs Reviewed - No data to display  Imaging Review No results found.    MDM   Final diagnoses:  Acute gout of left foot, unspecified cause    The exam is consistent with likely acute gouty arthritis, vital signs normal, no signs of septic arthritis, patient will be started on colchicine and discharged home. He has been given followup family doctor list.  Meds given in ED:  Medications  colchicine tablet 1.2 mg (not administered)    New Prescriptions   COLCHICINE 0.6 MG TABLET    Take 0.6mg  (one tablet) by mouth every 1-2 hours until one of the following occurs: 1.  The  pain is gone 2.  The maximum dose has been given ( no more than 3 tabs in 3 hours or 10 tabs in 24 hours) 3.  The side effects outweight the benefits       Johnna Acosta, MD 09/30/13 [phone]

## 2013-10-23 ENCOUNTER — Emergency Department (HOSPITAL_COMMUNITY): Payer: Self-pay

## 2013-10-23 ENCOUNTER — Emergency Department (HOSPITAL_COMMUNITY)
Admission: EM | Admit: 2013-10-23 | Discharge: 2013-10-24 | Disposition: A | Discharge status: Home / Self Care | Payer: Self-pay | Attending: Emergency Medicine | Admitting: Emergency Medicine

## 2013-10-23 ENCOUNTER — Encounter (HOSPITAL_COMMUNITY): Payer: Self-pay | Admitting: Emergency Medicine

## 2013-10-23 DIAGNOSIS — Z8709 Personal history of other diseases of the respiratory system: Secondary | ICD-10-CM | POA: Insufficient documentation

## 2013-10-23 DIAGNOSIS — R079 Chest pain, unspecified: Secondary | ICD-10-CM | POA: Insufficient documentation

## 2013-10-23 DIAGNOSIS — R071 Chest pain on breathing: Secondary | ICD-10-CM | POA: Insufficient documentation

## 2013-10-23 DIAGNOSIS — R0789 Other chest pain: Secondary | ICD-10-CM

## 2013-10-23 DIAGNOSIS — R55 Syncope and collapse: Secondary | ICD-10-CM | POA: Insufficient documentation

## 2013-10-23 DIAGNOSIS — F17200 Nicotine dependence, unspecified, uncomplicated: Secondary | ICD-10-CM | POA: Insufficient documentation

## 2013-10-23 LAB — BASIC METABOLIC PANEL
ANION GAP: 13 (ref 5–15)
BUN: 12 mg/dL (ref 6–23)
CHLORIDE: 104 meq/L (ref 96–112)
CO2: 24 mEq/L (ref 19–32)
Calcium: 9 mg/dL (ref 8.4–10.5)
Creatinine, Ser: 0.94 mg/dL (ref 0.50–1.35)
Glucose, Bld: 104 mg/dL — ABNORMAL HIGH (ref 70–99)
Potassium: 3.8 mEq/L (ref 3.7–5.3)
Sodium: 141 mEq/L (ref 137–147)

## 2013-10-23 LAB — I-STAT TROPONIN, ED
Troponin i, poc: 0 ng/mL (ref 0.00–0.08)
Troponin i, poc: 0 ng/mL (ref 0.00–0.08)

## 2013-10-23 LAB — CBC
HEMATOCRIT: 37.2 % — AB (ref 39.0–52.0)
Hemoglobin: 12.4 g/dL — ABNORMAL LOW (ref 13.0–17.0)
MCH: 27.6 pg (ref 26.0–34.0)
MCHC: 33.3 g/dL (ref 30.0–36.0)
MCV: 82.7 fL (ref 78.0–100.0)
PLATELETS: 175 10*3/uL (ref 150–400)
RBC: 4.5 MIL/uL (ref 4.22–5.81)
RDW: 14.4 % (ref 11.5–15.5)
WBC: 5.9 10*3/uL (ref 4.0–10.5)

## 2013-10-23 MED ORDER — NITROGLYCERIN 0.4 MG SL SUBL
0.4000 mg | SUBLINGUAL_TABLET | SUBLINGUAL | Status: DC | PRN
  Administered 2013-10-23: 0.4 mg via SUBLINGUAL
  Filled 2013-10-23: qty 1

## 2013-10-23 NOTE — ED Notes (Addendum)
Per EMS, the patient is from work, EMS called for near syncope episode.  Patient reports having chest pain, non radiating, throbbing for 2 weeks now.  Normal sinus with PVC's en route, no medical history, no meds at home.  20g in left [facility] placed by EMS. 324 aspirin and 2 nitro given en route.  BP 126/67, p 76, o2 sat 98%, RR 18.

## 2013-10-23 NOTE — ED Provider Notes (Signed)
CSN: [id number]     Arrival date & time 10/23/13  1942 History   First MD Initiated Contact with Patient 10/23/13 1945     Chief Complaint  Patient presents with  . Chest Pain    Patient is a 48 y.o. male presenting with chest pain. The history is provided by the patient.  Chest Pain Chest pain location: R lateral upper sternal border. Pain quality: throbbing   Pain radiates to:  R shoulder Pain radiates to the back: no   Pain severity now: 9/10 to 7/10 after NTG by EMS. Onset quality:  Sudden Duration:  2 weeks Timing:  Constant Progression:  Waxing and waning Chronicity:  New Context: at rest   Context: not breathing, no movement, not raising an arm and no trauma   Relieved by:  Nothing Exacerbated by: palpation. Associated symptoms: near-syncope (felt nauseated today with warmth and lightheaded while walking, improved with rest )   Associated symptoms: no abdominal pain, no back pain, no cough, no dysphagia, no fever, no headache, no lower extremity edema, no orthopnea, no palpitations, no shortness of breath, no syncope and not vomiting   Risk factors: smoking   Risk factors: no coronary artery disease, no diabetes mellitus, no high cholesterol, no hypertension, no immobilization and no prior DVT/PE     Past Medical History  Diagnosis Date  . Bronchitis   . Bronchitis    No past surgical history on file. No family history on file. History  Substance Use Topics  . Smoking status: Current Every Day Smoker -- 0.50 packs/day    Types: Cigarettes  . Smokeless tobacco: Never Used  . Alcohol Use: No    Review of Systems  Constitutional: Negative for fever.  HENT: Negative for trouble swallowing.   Respiratory: Negative for cough and shortness of breath.   Cardiovascular: Positive for chest pain and near-syncope (felt nauseated today with warmth and lightheaded while walking, improved with rest ). Negative for palpitations, orthopnea and syncope.  Gastrointestinal:  Negative for vomiting and abdominal pain.  Musculoskeletal: Negative for back pain.  Neurological: Negative for headaches.    Allergies  Tramadol and Vicodin  Home Medications   Prior to Admission medications   Medication Sig Start Date End Date Taking? Authorizing Provider  colchicine 0.6 MG tablet Take 0.6mg  (one tablet) by mouth every 1-2 hours until one of the following occurs: 1.  The pain is gone 2.  The maximum dose has been given ( no more than 3 tabs in 3 hours or 10 tabs in 24 hours) 3.  The side effects outweight the benefits 09/30/13   Johnna Acosta, MD   BP 124/73  Pulse 68  Temp(Src) 97.8 F (36.6 C) (Oral)  Resp 18  Ht 5\' 9"  (1.753 m)  Wt 222 lb (100.699 kg)  BMI 32.77 kg/m2  SpO2 98% Physical Exam  Nursing note and vitals reviewed. Constitutional: He is oriented to person, place, and time. He appears well-developed and well-nourished. No distress.  HENT:  Head: Normocephalic and atraumatic.  Nose: Nose normal.  Eyes: Conjunctivae are normal.  Neck: Normal range of motion. Neck supple. No tracheal deviation present.  Cardiovascular: Normal rate, regular rhythm and normal heart sounds.   No murmur heard. Pulmonary/Chest: Effort normal and breath sounds normal. No respiratory distress. He has no rales. He exhibits tenderness (CP is reproducible at lateral upper right sternal border).  Abdominal: Soft. Bowel sounds are normal. He exhibits no distension and no mass. There is no tenderness.  Musculoskeletal:  Normal range of motion. He exhibits no edema.  No lower extremity edema, calf tenderness, warmth, erythema or palpable cords  Neurological: He is alert and oriented to person, place, and time.  Skin: Skin is warm and dry. No rash noted. He is not diaphoretic.  Psychiatric: He has a normal mood and affect.    ED Course  Procedures (including critical care time) Labs Review Labs Reviewed  CBC - Abnormal; Notable for the following:    Hemoglobin 12.4 (*)     HCT 37.2 (*)    All other components within normal limits  BASIC METABOLIC PANEL - Abnormal; Notable for the following:    Glucose, Bld 104 (*)    All other components within normal limits  I-STAT TROPOININ, ED  Randolm Idol, ED    Imaging Review Dg Chest Port 1 View  10/23/2013   CLINICAL DATA:  Midsternal chest pain.  EXAM: PORTABLE CHEST - 1 VIEW  COMPARISON:  09/24/2013  FINDINGS: Heart, mediastinum hila are unremarkable. Lungs are clear. No pleural effusion or pneumothorax. Bony thorax is intact.  IMPRESSION: No active disease.   Electronically Signed   By: Lajean Manes M.D.   On: 10/23/2013 20:14     EKG Interpretation   Date/Time:  Sunday October 23 2013 19:46:29 EDT Ventricular Rate:  67 PR Interval:  161 QRS Duration: 118 QT Interval:  435 QTC Calculation: 459 R Axis:   30 Text Interpretation:  Sinus rhythm Nonspecific intraventricular conduction  delay Minimal ST elevation, anterior leads No significant change was found  Confirmed by CAMPOS  MD, KEVIN ([id number]) on 10/23/2013 10:47:10 PM      MDM   Final diagnoses:  Chest wall pain    Atypical CP for 3 weeks time, intermittent so will get delta trop.  HEAR score 3. OK for outpatient follow up.  CP is reproducible and at costochondral joints.  Suspect MSK source. Will treat with NSAIDs.   Pt is well appearing, equal pulses, normal mediastinum on CXR, doubt dissection.  No signs of DVT, VSS, no hemoptysis, no pleurisy, doubt PE.  No infectious symptoms.  No GI sx in history.     Patient noted presyncopal sx while walking improved with rest.  No murmur on exam.  EKG without dysrythmia, WPW, Brugada, significant LVH.  CXR and labs unremarkable.  Orthostatics negative. Pt has no significant medical history or risk factors to warrant inpatient admission for syncope.    12:12 AM Delta trop negative. Ekg without ischemic changes.  Pt denies chest pain currently.  He believes stress is responsible  Discussed strict  return precautions.   Tammy Sours, MD 10/24/13 [phone]

## 2013-10-23 NOTE — ED Notes (Signed)
Called main lab in regards to update on labs. Processing, will monitor results.

## 2013-10-23 NOTE — ED Notes (Signed)
EKG given to Dr. Henry Russel.

## 2013-10-23 NOTE — ED Notes (Signed)
Phlebotomy at the bedside  

## 2013-10-23 NOTE — ED Notes (Signed)
Portable x-ray at the bedside.  

## 2013-10-23 NOTE — ED Notes (Signed)
Explained to family labs ordered for chest pain pending prior to decision if going home or not.

## 2013-10-23 NOTE — ED Notes (Signed)
Dr. Henry Russel at the bedside. Will administer last nitro for pain management.

## 2013-10-24 NOTE — Discharge Instructions (Signed)
Chest Wall Pain °Chest wall pain is pain felt in or around the chest bones and muscles. It may take up to 6 weeks to get better. It may take longer if you are active. Chest wall pain can happen on its own. Other times, things like germs, injury, coughing, or exercise can cause the pain. °HOME CARE  °· Avoid activities that make you tired or cause pain. Try not to use your chest, belly (abdominal), or side muscles. Do not use heavy weights. °· Put ice on the sore area. °· Put ice in a plastic bag. °· Place a towel between your skin and the bag. °· Leave the ice on for 15-20 minutes for the first 2 days. °· Only take medicine as told by your doctor. °GET HELP RIGHT AWAY IF:  °· You have more pain or are very uncomfortable. °· You have a fever. °· Your chest pain gets worse. °· You have new problems. °· You feel sick to your stomach (nauseous) or throw up (vomit). °· You start to sweat or feel lightheaded. °· You have a cough with mucus (phlegm). °· You cough up blood. °MAKE SURE YOU:  °· Understand these instructions. °· Will watch your condition. °· Will get help right away if you are not doing well or get worse. °Document Released: 07/09/2007 Document Revised: 04/14/2011 Document Reviewed: 09/16/2010 °ExitCare® Patient Information ©2015 [employer]. This information is not intended to replace advice given to you by your health care provider. Make sure you discuss any questions you have with your health care provider. ° °Chest Wall Pain °Chest wall pain is pain in or around the bones and muscles of your chest. It may take up to 6 weeks to get better. It may take longer if you must stay physically active in your work and activities.  °CAUSES  °Chest wall pain may happen on its own. However, it may be caused by: °· A viral illness like the flu. °· Injury. °· Coughing. °· Exercise. °· Arthritis. °· Fibromyalgia. °· Shingles. °HOME CARE INSTRUCTIONS  °· Avoid overtiring physical activity. Try not to strain or perform  activities that cause pain. This includes any activities using your chest or your abdominal and side muscles, especially if heavy weights are used. °· Put ice on the sore area. °¨ Put ice in a plastic bag. °¨ Place a towel between your skin and the bag. °¨ Leave the ice on for 15-20 minutes per hour while awake for the first 2 days. °· Only take over-the-counter or prescription medicines for pain, discomfort, or fever as directed by your caregiver. °SEEK IMMEDIATE MEDICAL CARE IF:  °· Your pain increases, or you are very uncomfortable. °· You have a fever. °· Your chest pain becomes worse. °· You have new, unexplained symptoms. °· You have nausea or vomiting. °· You feel sweaty or lightheaded. °· You have a cough with phlegm (sputum), or you cough up blood. °MAKE SURE YOU:  °· Understand these instructions. °· Will watch your condition. °· Will get help right away if you are not doing well or get worse. °Document Released: 01/20/2005 Document Revised: 04/14/2011 Document Reviewed: 09/16/2010 °ExitCare® Patient Information ©2015 [employer]. This information is not intended to replace advice given to you by your health care provider. Make sure you discuss any questions you have with your health care provider. ° °

## 2013-10-24 NOTE — ED Provider Notes (Signed)
I saw and evaluated the patient, reviewed the resident's note and I agree with the findings and plan.   EKG Interpretation   Date/Time:  Sunday October 23 2013 19:46:29 EDT Ventricular Rate:  67 PR Interval:  161 QRS Duration: 118 QT Interval:  435 QTC Calculation: 459 R Axis:   30 Text Interpretation:  Sinus rhythm Nonspecific intraventricular conduction  delay Minimal ST elevation, anterior leads No significant change was found  Confirmed by Meila Berke  MD, Uriyah Raska ([id number]) on 10/23/2013 10:47:10 PM     EKG and serial troponins are negative.  Discharge home in good condition.  Patient is overall well-appearing.  Outpatient followup.  Patient understands return to the ER for new or worsening symptoms.  Low suspicion for pulmonary embolism.    Hoy Morn, MD 10/24/13 [phone]

## 2014-11-21 ENCOUNTER — Emergency Department (HOSPITAL_COMMUNITY): Payer: Self-pay

## 2014-11-21 ENCOUNTER — Encounter (HOSPITAL_COMMUNITY): Payer: Self-pay | Admitting: Emergency Medicine

## 2014-11-21 ENCOUNTER — Emergency Department (HOSPITAL_COMMUNITY)
Admission: EM | Admit: 2014-11-21 | Discharge: 2014-11-21 | Disposition: A | Discharge status: Home / Self Care | Payer: Self-pay | Attending: Emergency Medicine | Admitting: Emergency Medicine

## 2014-11-21 DIAGNOSIS — J329 Chronic sinusitis, unspecified: Secondary | ICD-10-CM

## 2014-11-21 DIAGNOSIS — J019 Acute sinusitis, unspecified: Secondary | ICD-10-CM | POA: Insufficient documentation

## 2014-11-21 DIAGNOSIS — M5416 Radiculopathy, lumbar region: Secondary | ICD-10-CM | POA: Insufficient documentation

## 2014-11-21 DIAGNOSIS — Z87891 Personal history of nicotine dependence: Secondary | ICD-10-CM | POA: Insufficient documentation

## 2014-11-21 LAB — CBC WITH DIFFERENTIAL/PLATELET
Basophils Absolute: 0 10*3/uL (ref 0.0–0.1)
Basophils Relative: 0 %
EOS PCT: 2 %
Eosinophils Absolute: 0.1 10*3/uL (ref 0.0–0.7)
HCT: 39.5 % (ref 39.0–52.0)
Hemoglobin: 12.9 g/dL — ABNORMAL LOW (ref 13.0–17.0)
LYMPHS PCT: 35 %
Lymphs Abs: 2.6 10*3/uL (ref 0.7–4.0)
MCH: 28.4 pg (ref 26.0–34.0)
MCHC: 32.7 g/dL (ref 30.0–36.0)
MCV: 86.8 fL (ref 78.0–100.0)
MONO ABS: 0.5 10*3/uL (ref 0.1–1.0)
MONOS PCT: 6 %
Neutro Abs: 4.2 10*3/uL (ref 1.7–7.7)
Neutrophils Relative %: 57 %
PLATELETS: 216 10*3/uL (ref 150–400)
RBC: 4.55 MIL/uL (ref 4.22–5.81)
RDW: 14.7 % (ref 11.5–15.5)
WBC: 7.5 10*3/uL (ref 4.0–10.5)

## 2014-11-21 LAB — BASIC METABOLIC PANEL
Anion gap: 9 (ref 5–15)
BUN: 10 mg/dL (ref 6–20)
CO2: 25 mmol/L (ref 22–32)
Calcium: 9.1 mg/dL (ref 8.9–10.3)
Chloride: 108 mmol/L (ref 101–111)
Creatinine, Ser: 0.93 mg/dL (ref 0.61–1.24)
GFR calc Af Amer: >60 mL/min (ref >60)
Glucose, Bld: 99 mg/dL (ref 65–99)
Potassium: 3.6 mmol/L (ref 3.5–5.1)
Sodium: 142 mmol/L (ref 135–145)

## 2014-11-21 LAB — I-STAT TROPONIN, ED: Troponin i, poc: 0 ng/mL (ref 0.00–0.08)

## 2014-11-21 LAB — D-DIMER, QUANTITATIVE: D-Dimer, Quant: 0.38 ug/mL-FEU (ref 0.00–0.48)

## 2014-11-21 MED ORDER — DIAZEPAM 5 MG PO TABS: 5.0000 mg | ORAL_TABLET | Freq: Four times a day (QID) | ORAL | Status: DC | PRN

## 2014-11-21 MED ORDER — AMOXICILLIN-POT CLAVULANATE 875-125 MG PO TABS: 1.0000 | ORAL_TABLET | Freq: Two times a day (BID) | ORAL | Status: DC

## 2014-11-21 MED ORDER — ASPIRIN 81 MG PO CHEW
324.0000 mg | CHEWABLE_TABLET | Freq: Once | ORAL | Status: AC
  Administered 2014-11-21: 324 mg via ORAL
  Filled 2014-11-21: qty 4

## 2014-11-21 MED ORDER — IBUPROFEN 600 MG PO TABS: 600.0000 mg | ORAL_TABLET | Freq: Four times a day (QID) | ORAL | Status: DC | PRN

## 2014-11-21 NOTE — ED Notes (Signed)
Patient reports chest pain, headache, sneezing and productive cough x1 month. Patient denies fever but reports some diaphoresis. NAD.

## 2014-11-21 NOTE — Discharge Instructions (Signed)
Sinusitis, Adult  Take the antibiotics as prescribed.  Use over the counter nasal sprays for symptom control.  Do NOT smoke.  Sinusitis is redness, soreness, and inflammation of the paranasal sinuses. Paranasal sinuses are air pockets within the bones of your face. They are located beneath your eyes, in the middle of your forehead, and above your eyes. In healthy paranasal sinuses, mucus is able to drain out, and air is able to circulate through them by way of your nose. However, when your paranasal sinuses are inflamed, mucus and air can become trapped. This can allow bacteria and other germs to grow and cause infection. Sinusitis can develop quickly and last only a short time (acute) or continue over a long period (chronic). Sinusitis that lasts for more than 12 weeks is considered chronic. CAUSES Causes of sinusitis include:  Allergies.  Structural abnormalities, such as displacement of the cartilage that separates your nostrils (deviated septum), which can decrease the air flow through your nose and sinuses and affect sinus drainage.  Functional abnormalities, such as when the small hairs (cilia) that line your sinuses and help remove mucus do not work properly or are not present. SIGNS AND SYMPTOMS Symptoms of acute and chronic sinusitis are the same. The primary symptoms are pain and pressure around the affected sinuses. Other symptoms include:  Upper toothache.  Earache.  Headache.  Bad breath.  Decreased sense of smell and taste.  A cough, which worsens when you are lying flat.  Fatigue.  Fever.  Thick drainage from your nose, which often is green and may contain pus (purulent).  Swelling and warmth over the affected sinuses. DIAGNOSIS Your health care provider will perform a physical exam. During your exam, your health care provider may perform any of the following to help determine if you have acute sinusitis or chronic sinusitis:  Look in your nose for signs of  abnormal growths in your nostrils (nasal polyps).  Tap over the affected sinus to check for signs of infection.  View the inside of your sinuses using an imaging device that has a light attached (endoscope). If your health care provider suspects that you have chronic sinusitis, one or more of the following tests may be recommended:  Allergy tests.  Nasal culture. A sample of mucus is taken from your nose, sent to a lab, and screened for bacteria.  Nasal cytology. A sample of mucus is taken from your nose and examined by your health care provider to determine if your sinusitis is related to an allergy. TREATMENT Most cases of acute sinusitis are related to a viral infection and will resolve on their own within 10 days. Sometimes, medicines are prescribed to help relieve symptoms of both acute and chronic sinusitis. These may include pain medicines, decongestants, nasal steroid sprays, or saline sprays. However, for sinusitis related to a bacterial infection, your health care provider will prescribe antibiotic medicines. These are medicines that will help kill the bacteria causing the infection. Rarely, sinusitis is caused by a fungal infection. In these cases, your health care provider will prescribe antifungal medicine. For some cases of chronic sinusitis, surgery is needed. Generally, these are cases in which sinusitis recurs more than 3 times per year, despite other treatments. HOME CARE INSTRUCTIONS  Drink plenty of water. Water helps thin the mucus so your sinuses can drain more easily.  Use a humidifier.  Inhale steam 3-4 times a day (for example, sit in the bathroom with the shower running).  Apply a warm, moist washcloth to  your face 3-4 times a day, or as directed by your health care provider.  Use saline nasal sprays to help moisten and clean your sinuses.  Take medicines only as directed by your health care provider.  If you were prescribed either an antibiotic or antifungal  medicine, finish it all even if you start to feel better. SEEK IMMEDIATE MEDICAL CARE IF:  You have increasing pain or severe headaches.  You have nausea, vomiting, or drowsiness.  You have swelling around your face.  You have vision problems.  You have a stiff neck.  You have difficulty breathing.   This information is not intended to replace advice given to you by your health care provider. Make sure you discuss any questions you have with your health care provider.   Document Released: 01/20/2005 Document Revised: 02/10/2014 Document Reviewed: 02/04/2011 Elsevier Interactive Patient Education 2016 [employer]   Lumbosacral Radiculopathy  You have some narrowing of the bones in your back and some arthritis which are likely contributing to your pain that radiates down your leg.  Take the Motrin and Valium as needed to help with this pain.    Lumbosacral radiculopathy is a condition that involves the spinal nerves and nerve roots in the low back and bottom of the spine. The condition develops when these nerves and nerve roots move out of place or become inflamed and cause symptoms. CAUSES This condition may be caused by:  Pressure from a disk that bulges out of place (herniated disk). A disk is a plate of cartilage that separates bones in the spine.  Disk degeneration.  A narrowing of the bones of the lower back (spinal stenosis).  A tumor.  An infection.  An injury that places sudden pressure on the disks that cushion the bones of your lower spine. RISK FACTORS This condition is more likely to develop in:  Males aged 30-50 years.  Females aged 38-60 years.  People who lift improperly.  People who are overweight or live a sedentary lifestyle.  People who smoke.  People who perform repetitive activities that strain the spine. SYMPTOMS Symptoms of this condition include:  Pain that goes down from the back into the legs (sciatica). This is the most common  symptom. The pain may be worse with sitting, coughing, or sneezing.  Pain and numbness in the arms and legs.  Muscle weakness.  Tingling.  Loss of bladder control or bowel control. DIAGNOSIS This condition is diagnosed with a physical exam and medical history. If the pain is lasting, you may have tests, such as:  MRI scan.  X-ray.  CT scan.  Myelogram.  Nerve conduction study. TREATMENT This condition is often treated with:  Hot packs and ice applied to affected areas.  Stretches to improve flexibility.  Exercises to strengthen back muscles.  Physical therapy.  Pain medicine.  A steroid injection in the spine. In some cases, no treatment is needed. If the condition is long-lasting (chronic), or if symptoms are severe, treatment may involve surgery or lifestyle changes, such as following a weight loss plan. HOME CARE INSTRUCTIONS Medicines  Take medicines only as directed by your health care provider.  Do not drive or operate heavy machinery while taking pain medicine. Injury Care  Apply a heat pack to the injured area as directed by your health care provider.  Apply ice to the affected area:  Put ice in a plastic bag.  Place a towel between your skin and the bag.  Leave the ice on for 20-30  minutes, every 2 hours while you are awake or as needed. Or, leave the ice on for as long as directed by your health care provider. Other Instructions  If you were shown how to do any exercises or stretches, do them as directed by your health care provider.  If your health care provider prescribed a diet or exercise program, follow it as directed.  Keep all follow-up visits as directed by your health care provider. This is important. SEEK MEDICAL CARE IF:  Your pain does not improve over time even when taking pain medicines. SEEK IMMEDIATE MEDICAL CARE IF:  Your develop severe pain.  Your pain suddenly gets worse.  You develop increasing weakness in your  legs.  You lose the ability to control your bladder or bowel.  You have difficulty walking or balancing.  You have a fever.   This information is not intended to replace advice given to you by your health care provider. Make sure you discuss any questions you have with your health care provider.   Document Released: 01/20/2005 Document Revised: 06/06/2014 Document Reviewed: 01/16/2014 Elsevier Interactive Patient Education [employer].

## 2014-11-21 NOTE — ED Provider Notes (Signed)
CSN: [id number]     Arrival date & time 11/21/14  3570 History   First MD Initiated Contact with Patient 11/21/14 1000     Chief Complaint  Patient presents with  . Chest Pain  . Cough     (Consider location/radiation/quality/duration/timing/severity/associated sxs/prior Treatment) HPI Comments: 49 y.o. Male with history of bronchitis, smoking presents for cough, chest pain, congestion and with pain in his lower back that radiates down the left leg.  Patient reports that the symptoms have been present for 1 month.  The patient reports the symptoms come and go and are not constant.  No fevers.  He reports that he has had a headache in his forehead with these symptoms as well.  He reports he quit smoking a few days ago.  His chest pain seems to be triggered by coughing and feels like aching and soreness.  Over the last 1 week his cough has been getting worse and has been more productive of sputum.  Denies fever or chills.  Patient has not taken anything for these symptoms.  Patient reports normal strength and sensation.  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  Patient is a 49 y.o. male presenting with chest pain and cough.  Chest Pain Associated symptoms: cough   Associated symptoms: no abdominal pain, no back pain, no dizziness, no fatigue, no fever, no headache, no nausea, no palpitations, not vomiting and no weakness   Cough Associated symptoms: chest pain and rhinorrhea   Associated symptoms: no chills, no ear pain, no fever, no headaches, no myalgias, no rash, no sore throat and no wheezing     Past Medical History  Diagnosis Date  . Bronchitis   . Bronchitis    History reviewed. No pertinent past surgical history. No family history on file. Social History  Substance Use Topics  . Smoking status: Former Smoker -- 0.00 packs/day    Types: Cigarettes  . Smokeless tobacco: Never Used  . Alcohol Use: No    Review of  Systems  Constitutional: Negative for fever, chills, appetite change and fatigue.  HENT: Positive for congestion, postnasal drip, rhinorrhea, sinus pressure and sneezing. Negative for ear pain and sore throat.   Eyes: Negative for photophobia and pain.  Respiratory: Positive for cough. Negative for chest tightness and wheezing.   Cardiovascular: Positive for chest pain. Negative for palpitations.  Gastrointestinal: Negative for nausea, vomiting, abdominal pain, diarrhea and constipation.  Genitourinary: Negative for dysuria, urgency, hematuria and flank pain.  Musculoskeletal: Negative for myalgias and back pain.  Skin: Negative for rash.  Neurological: Negative for dizziness, seizures, weakness, light-headedness and headaches.  Hematological: Does not bruise/bleed easily.      Allergies  Tramadol and Vicodin  Home Medications   Prior to Admission medications   Medication Sig Start Date End Date Taking? Authorizing Provider  amoxicillin-clavulanate (AUGMENTIN) 875-125 MG tablet Take 1 tablet by mouth every 12 (twelve) hours. 11/21/14   Harvel Quale, MD  diazepam (VALIUM) 5 MG tablet Take 1 tablet (5 mg total) by mouth every 6 (six) hours as needed for muscle spasms. 11/21/14   Harvel Quale, MD  ibuprofen (ADVIL,MOTRIN) 600 MG tablet Take 1 tablet (600 mg total) by mouth every 6 (six) hours as needed. 11/21/14   Harvel Quale, MD   BP 131/85 mmHg  Pulse 79  Temp(Src) 97.5 F (36.4 C) (Oral)  Resp 18  SpO2 99% Physical Exam  Constitutional:  He is oriented to person, place, and time. He appears well-developed and well-nourished. No distress.  HENT:  Head: Normocephalic and atraumatic.  Right Ear: Tympanic membrane and external ear normal.  Left Ear: Tympanic membrane and external ear normal.  Nose: Right sinus exhibits maxillary sinus tenderness and frontal sinus tenderness. Left sinus exhibits maxillary sinus tenderness and frontal sinus tenderness.  Mouth/Throat:  Oropharynx is clear and moist. No oropharyngeal exudate, posterior oropharyngeal edema or posterior oropharyngeal erythema.  Patient with poor transillumination of the maxillary and frontal sinuses consistent with sinus congestion.  Postnasal drip over the posterior pharynx on examination.  Eyes: EOM are normal. Pupils are equal, round, and reactive to light.  Neck: Normal range of motion. Neck supple.  Cardiovascular: Normal rate, regular rhythm, normal heart sounds and intact distal pulses.   No murmur heard. Pulmonary/Chest: Effort normal. No respiratory distress. He has no wheezes. He has no rales.  Abdominal: Soft. He exhibits no distension. There is no tenderness.  Musculoskeletal: He exhibits no edema.       Cervical back: Normal.       Thoracic back: Normal.       Lumbar back: He exhibits decreased range of motion. He exhibits no tenderness, no bony tenderness, no swelling, no edema, no spasm and normal pulse.  Neurological: He is alert and oriented to person, place, and time. He has normal strength. No cranial nerve deficit or sensory deficit. He exhibits normal muscle tone. Coordination normal.  Patient able to stand without difficulty.  Patient is able to ambulate without difficulty and able to walk on both heels and toes.  Skin: Skin is warm and dry. No rash noted. He is not diaphoretic.  Vitals reviewed.   ED Course  Procedures (including critical care time) Labs Review Labs Reviewed  CBC WITH DIFFERENTIAL/PLATELET - Abnormal; Notable for the following:    Hemoglobin 12.9 (*)    All other components within normal limits  BASIC METABOLIC PANEL  D-DIMER, QUANTITATIVE (NOT AT [facility])  Randolm Idol, ED    Imaging Review Dg Chest 2 View  11/21/2014  CLINICAL DATA:  Cough and chest pain EXAM: CHEST  2 VIEW COMPARISON:  10/23/2013 FINDINGS: The heart size and mediastinal contours are within normal limits. Both lungs are clear. The visualized skeletal structures are  unremarkable. IMPRESSION: No active cardiopulmonary disease. Electronically Signed   By: Franchot Gallo M.D.   On: 11/21/2014 11:03   Dg Lumbar Spine Complete  11/21/2014  CLINICAL DATA:  Cough, chest pain, and back pain running down both legs for 1 month. EXAM: LUMBAR SPINE - COMPLETE 4+ VIEW COMPARISON:  None. FINDINGS: Partial sacralization of L5. No acute fracture endplate erosion. Mild dextrocurvature of the lumbar spine without subluxation. Bulky spondylotic spurring with diffuse mild to moderate disc narrowing, greatest at L3-4. IMPRESSION: 1. No acute finding. 2. Spondylosis with degenerative disc narrowing greatest at L3-4. Electronically Signed   By: Monte Fantasia M.D.   On: 11/21/2014 11:14   I have personally reviewed and evaluated these images and lab results as part of my medical decision-making.   EKG Interpretation   Date/Time:  Tuesday November 21 2014 09:57:39 EDT Ventricular Rate:  83 PR Interval:  153 QRS Duration: 103 QT Interval:  383 QTC Calculation: 450 R Axis:   57 Text Interpretation:  Sinus rhythm RSR' in V1 or V2, probably normal  variant No significant change since last tracing Confirmed by NGUYEN,  EMILY ([id number]) on 11/21/2014 10:01:26 AM      MDM  Patient seen and evaluated in stable condition.  Atypical chest pain most consistent with URI/coughing related inflammation.  EKG unremarkable for patient.  D dimer and Troponin normal.  Other labs unremarkable.  Chest xray unremarkable.  Lumbar spine xray with spondylosis and degenerative disc disease consistent with radicular symptoms.  Patient neurovascularly intact.  Patient given Motrin and Valium for back pain.  Results and clinical impression discussed at length with patient who expressed understanding and agreement.  Patient was discharged home in stable condition with prescriptions for Augmentin for sinusitis as well as valium and Motrin for back pain/radicular symptoms.  Patient was instructed to follow up  outpatient and establish care.  He was given strict return precautions.  All questions answered prior to discharge. Final diagnoses:  Lumbar radicular pain  Sinusitis, unspecified chronicity, unspecified location    1. Lumbar radicular pain  2. Spondylosis  3. Sinusitis    Harvel Quale, MD 11/21/14 2136

## 2015-03-10 ENCOUNTER — Encounter (HOSPITAL_COMMUNITY): Payer: Self-pay | Admitting: Emergency Medicine

## 2015-03-10 ENCOUNTER — Emergency Department (HOSPITAL_COMMUNITY): Payer: PRIVATE HEALTH INSURANCE

## 2015-03-10 ENCOUNTER — Emergency Department (HOSPITAL_COMMUNITY)
Admission: EM | Admit: 2015-03-10 | Discharge: 2015-03-10 | Disposition: A | Discharge status: Home / Self Care | Payer: PRIVATE HEALTH INSURANCE | Attending: Emergency Medicine | Admitting: Emergency Medicine

## 2015-03-10 DIAGNOSIS — M19072 Primary osteoarthritis, left ankle and foot: Secondary | ICD-10-CM

## 2015-03-10 DIAGNOSIS — Z87891 Personal history of nicotine dependence: Secondary | ICD-10-CM | POA: Insufficient documentation

## 2015-03-10 DIAGNOSIS — Z8709 Personal history of other diseases of the respiratory system: Secondary | ICD-10-CM | POA: Diagnosis not present

## 2015-03-10 DIAGNOSIS — M10072 Idiopathic gout, left ankle and foot: Secondary | ICD-10-CM | POA: Insufficient documentation

## 2015-03-10 DIAGNOSIS — M109 Gout, unspecified: Secondary | ICD-10-CM

## 2015-03-10 DIAGNOSIS — M79672 Pain in left foot: Secondary | ICD-10-CM | POA: Diagnosis present

## 2015-03-10 DIAGNOSIS — Z792 Long term (current) use of antibiotics: Secondary | ICD-10-CM | POA: Diagnosis not present

## 2015-03-10 MED ORDER — INDOMETHACIN 25 MG PO CAPS
50.0000 mg | ORAL_CAPSULE | Freq: Once | ORAL | Status: AC
  Administered 2015-03-10: 50 mg via ORAL
  Filled 2015-03-10: qty 2

## 2015-03-10 MED ORDER — INDOMETHACIN 25 MG PO CAPS: 50.0000 mg | ORAL_CAPSULE | Freq: Three times a day (TID) | ORAL | Status: DC

## 2015-03-10 MED ORDER — PREDNISONE 20 MG PO TABS
40.0000 mg | ORAL_TABLET | Freq: Once | ORAL | Status: AC
  Administered 2015-03-10: 40 mg via ORAL
  Filled 2015-03-10: qty 2

## 2015-03-10 NOTE — Discharge Instructions (Signed)
Gout Follow-up with a primary care provider using the resource Below. Gout is when your joints become red, sore, and swell (inflamed). This is caused by the buildup of uric acid crystals in the joints. Uric acid is a chemical that is normally in the blood. If the level of uric acid gets too high in the blood, these crystals form in your joints and tissues. Over time, these crystals can form into masses near the joints and tissues. These masses can destroy bone and cause the bone to look misshapen (deformed). HOME CARE   Do not take aspirin for pain.  Only take medicine as told by your doctor.  Rest the joint as much as you can. When in bed, keep sheets and blankets off painful areas.  Keep the sore joints raised (elevated).  Put warm or cold packs on painful joints. Use of warm or cold packs depends on which works best for you.  Use crutches if the painful joint is in your leg.  Drink enough fluids to keep your pee (urine) clear or pale yellow. Limit alcohol, sugary drinks, and drinks with fructose in them.  Follow your diet instructions. Pay careful attention to how much protein you eat. Include fruits, vegetables, whole grains, and fat-free or low-fat milk products in your daily diet. Talk to your doctor or dietitian about the use of coffee, vitamin C, and cherries. These may help lower uric acid levels.  Keep a healthy body weight. GET HELP RIGHT AWAY IF:   You have watery poop (diarrhea), throw up (vomit), or have any side effects from medicines.  You do not feel better in 24 hours, or you are getting worse.  Your joint becomes suddenly more tender, and you have chills or a fever. MAKE SURE YOU:   Understand these instructions.  Will watch your condition.  Will get help right away if you are not doing well or get worse.   This information is not intended to replace advice given to you by your health care provider. Make sure you discuss any questions you have with your health  care provider.   Document Released: 10/30/2007 Document Revised: 02/10/2014 Document Reviewed: 09/03/2011 Elsevier Interactive Patient Education 2016 [place] are compounds that affect the level of uric acid in your body. A low-purine diet is a diet that is low in purines. Eating a low-purine diet can prevent the level of uric acid in your body from getting too high and causing gout or kidney stones or both. WHAT DO I NEED TO KNOW ABOUT THIS DIET?  Choose low-purine foods. Examples of low-purine foods are listed in the next section.  Drink plenty of fluids, especially water. Fluids can help remove uric acid from your body. Try to drink 8-16 cups (1.9-3.8 L) a day.  Limit foods high in fat, especially saturated fat, as fat makes it harder for the body to get rid of uric acid. Foods high in saturated fat include pizza, cheese, ice cream, whole milk, fried foods, and gravies. Choose foods that are lower in fat and lean sources of protein. Use olive oil when cooking as it contains healthy fats that are not high in saturated fat.  Limit alcohol. Alcohol interferes with the elimination of uric acid from your body. If you are having a gout attack, avoid all alcohol.  Keep in mind that different people's bodies react differently to different foods. You will probably learn over time which foods do or do not affect you. If you  discover that a food tends to cause your gout to flare up, avoid eating that food. You can more freely enjoy foods that do not cause problems. If you have any questions about a food item, talk to your dietitian or health care provider. WHICH FOODS ARE LOW, MODERATE, AND HIGH IN PURINES? The following is a list of foods that are low, moderate, and high in purines. You can eat any amount of the foods that are low in purines. You may be able to have small amounts of foods that are moderate in purines. Ask your health care provider how much of a food  moderate in purines you can have. Avoid foods high in purines. Grains  Foods low in purines: Enriched white bread, pasta, rice, cake, cornbread, popcorn.  Foods moderate in purines: Whole-grain breads and cereals, wheat germ, bran, oatmeal. Uncooked oatmeal. Dry wheat bran or wheat germ.  Foods high in purines: Pancakes, [place] toast, biscuits, muffins. Vegetables  Foods low in purines: All vegetables, except those that are moderate in purines.  Foods moderate in purines: Asparagus, cauliflower, spinach, mushrooms, green peas. Fruits  All fruits are low in purines. Meats and other Protein Foods  Foods low in purines: Eggs, nuts, peanut butter.  Foods moderate in purines: 80-90% lean beef, lamb, veal, pork, poultry, fish, eggs, peanut butter, nuts. Crab, lobster, oysters, and shrimp. Cooked dried beans, peas, and lentils.  Foods high in purines: Anchovies, sardines, herring, mussels, tuna, codfish, scallops, trout, and haddock. Terry Poole. Organ meats (such as liver or kidney). Tripe. Game meat. Goose. Sweetbreads. Dairy  All dairy foods are low in purines. Low-fat and fat-free dairy products are best because they are low in saturated fat. Beverages  Drinks low in purines: Water, carbonated beverages, tea, coffee, cocoa.  Drinks moderate in purines: Soft drinks and other drinks sweetened with high-fructose corn syrup. Juices. To find whether a food or drink is sweetened with high-fructose corn syrup, look at the ingredients list.  Drinks high in purines: Alcoholic beverages (such as beer). Condiments  Foods low in purines: Salt, herbs, olives, pickles, relishes, vinegar.  Foods moderate in purines: Butter, margarine, oils, mayonnaise. Fats and Oils  Foods low in purines: All types, except gravies and sauces made with meat.  Foods high in purines: Gravies and sauces made with meat. Other Foods  Foods low in purines: Sugars, sweets, gelatin. Cake. Soups made without  meat.  Foods moderate in purines: Meat-based or fish-based soups, broths, or bouillons. Foods and drinks sweetened with high-fructose corn syrup.  Foods high in purines: High-fat desserts (such as ice cream, cookies, cakes, pies, doughnuts, and chocolate). Contact your dietitian for more information on foods that are not listed here.   This information is not intended to replace advice given to you by your health care provider. Make sure you discuss any questions you have with your health care provider.   Document Released: 05/17/2010 Document Revised: 01/25/2013 Document Reviewed: 12/27/2012 Elsevier Interactive Patient Education [employer].  Emergency Department Resource Guide 1) Find a Doctor and Pay Out of Pocket Although you won't have to find out who is covered by your insurance plan, it is a good idea to ask around and get recommendations. You will then need to call the office and see if the doctor you have chosen will accept you as a new patient and what types of options they offer for patients who are self-pay. Some doctors offer discounts or will set up payment plans for their patients who do not  have insurance, but you will need to ask so you aren't surprised when you get to your appointment.  2) Contact Your Local Health Department Not all health departments have doctors that can see patients for sick visits, but many do, so it is worth a call to see if yours does. If you don't know where your local health department is, you can check in your phone book. The CDC also has a tool to help you locate your state's health department, and many state websites also have listings of all of their local health departments.  3) Find a [facility] If your illness is not likely to be very severe or complicated, you may want to try a walk in clinic. These are popping up all over the country in pharmacies, drugstores, and shopping centers. They're usually staffed by nurse practitioners or  physician assistants that have been trained to treat common illnesses and complaints. They're usually fairly quick and inexpensive. However, if you have serious medical issues or chronic medical problems, these are probably not your best option.  No Primary Care Doctor: - Call Health Connect at  [phone] - they can help you locate a primary care doctor that  accepts your insurance, provides certain services, etc. - Physician Referral Service- [phone]  Chronic Pain Problems: Organization         Address  Phone   Notes  [facility]  [phone] Patients need to be referred by their primary care doctor.   Medication Assistance: Organization         Address  Phone   Notes  [facility] Medication [facility] [address] [place], [place] [address] [phone] --Must be a resident of [facility] -- Must have NO insurance coverage whatsoever (no Medicaid/ Medicare, etc.) -- The pt. MUST have a primary care doctor that directs their care regularly and follows them in the community   Med[facility]ist  [phone]   [employer]  [phone]    Agencies that provide inexpensive medical care: Organization         Address  Phone   Notes  [place]  [phone]   [facility]    [phone]   [facility] [place], [place] [address] [phone]   [place] [address] [place] [phone]   Planned Parenthood    [phone]   [facility]    [phone]   [place] and [place] [address], [place] Phone:  [phone], Fax:  [phone] Hours of Operation:  9 am - 6 pm, M-F.  Also accepts Medicaid/Medicare and self-pay.  [facility] for [place], [address], [place] Phone: [phone], Fax: [phone]. Hours of Operation:  8:30 am - 5:30 pm, M-F.   Also accepts Medicaid and self-pay.  [facility] [place] [address] [place] Phone: [phone]   [place], [place], [place] [phone], Ext. [phone] Mondays & Thursdays: 7-9 AM.  First 15 patients are seen on a first come, first serve basis.    [place] Providers:  Organization         Address  Phone   Notes  [facility] [address], [place] [phone] Also accepts self-pay patients.  [place]  [address] Dolores Patty [place], [place]  [phone]   [place], [address], [place] [phone]   [place] [address], [place] [phone]   Lucianne Lei [address], [place]   [phone] Only accepts [place] Access [place] patients after they have their name applied to their card.   Self-Pay (no insurance) in [facility]:  Organization         Address  Phone   Notes  Sickle Cell Patients, [facility] [place] [phone]   [facility] [place] [phone]   [facility] [place]  [place], [place], [place] [phone]   [facility]/Dr. Osei-Bonsu  [address] [place] or [address], [place] [phone] Phone number for both [place] and [place] locations is the same.  [facility] [address] [place] [phone]   [facility] [address], [place] or [address] [phone]   [facility] [address], [place] [phone], phone; [phone], fax Sees patients 1st and 3rd Saturday of every month.  Must not qualify for public or private insurance (i.e. Medicaid, Medicare, [place] Health Choice, Veterans'  Benefits)  Household income should be no more than 200% of the poverty level The clinic cannot treat you if you are pregnant or think you are pregnant  Sexually transmitted diseases are not treated at the clinic.    Dental Care: Organization         Address  Phone  Notes  [facility] Department of [facility] [place] [phone] Accepts children up to age 50 who are enrolled in [place] or [place]; pregnant women with a Medicaid card; and children who have applied for Medicaid or [place] Health Choice, but were declined, whose parents can pay a reduced fee at time of service.  [facility] Department of [facility]  [address], [place] [phone] Accepts children up to age 65 who are enrolled in [place] or [place]; pregnant women with a Medicaid card; and children who have applied for Medicaid or [place] Health Choice, but were declined, whose parents can pay a reduced fee at time of service.  [facility] Adult [facility] Access PROGRAM  [place] [phone] Patients are seen by appointment only. Walk-ins are not accepted. [place] will see patients 61 years of age and older. Monday - Tuesday (8am-5pm) Most Wednesdays (8:30-5pm) $30 per visit, cash only  [facility] Adult Dental Access PROGRAM  [address], [facility] [phone] Patients are seen by appointment only. Walk-ins are not accepted. [place] will see patients 29 years of age and older. One Wednesday Evening (Monthly: Volunteer Based).  $30 per visit, cash only  [place]  [phone] for adults; Children under age 40, call Graduate Pediatric Dentistry at [phone]. Children aged 21-14, please call [phone] to request a pediatric application.  Dental services are provided in all areas of dental care including fillings, crowns and bridges, complete and partial  dentures, implants, gum treatment, root canals, and extractions. Preventive care is also provided. Treatment is provided to both adults and children. Patients are selected  via a lottery and there is often a waiting list.   [facility] [address] [place]  [phone] [email or web address]   [facility] [address] [place], [place] [phone], Ext. [phone] Second and Fourth Thursday of each month, opens at 6:30 AM; Clinic ends at 9 AM.  Patients are seen on a first-come first-served basis, and a limited number are seen during each clinic.   [facility]  [address] Hillard Danker [place], [place] [phone]   Eligibility Requirements You must have lived in [place], [place], or [place] counties for at least the last three months.   You cannot be eligible for state or federal sponsored [employer], including [employer], [place], or [employer].   You generally cannot be eligible for healthcare insurance through your employer.    How to apply: Eligibility screenings are held every Tuesday and Wednesday afternoon from 1:00 pm until 4:00 pm. You do not need an appointment for the interview!  [facility] [address], [place], [place]   [place]  [place] Department  [place]  [phone]    Behavioral Health Resources in the Community: Intensive Outpatient Programs Organization         Address  Phone  Notes  [place]. [address], [place], [place] [phone]   [facility] Outpatient [address] [place], [place]   ADS: Alcohol & Drug Svcs [address], [place], [place]   [place] [address]  [place], [place] or [phone]   Substance Abuse Resources Organization          Address  Phone  Notes  Alcohol and Drug Services  [phone]   [place]  [phone]   [address]   Chinita Pester  [phone]   Residential & Outpatient Substance Abuse Program  [phone]   Psychological Services Organization         Address  Phone  Notes  [facility] [address]  [place]  [phone]   [place] [address], [place] or [phone]    Mobile Crisis Teams Organization         Address  Phone  Notes  [facility], Mobile Crisis Care Unit  [phone]   [facility]  [address] [place], [place]   [place] [address], [place] [phone]    Self-Help/Support Groups Organization         Address  Phone             Notes  [place]. of [place] - variety of support groups  [place] Call for more information  Narcotics Anonymous (NA), [facility] [address], [employer] [place]  2 meetings at this location   Special educational needs teacher         Address  Phone  Notes  [facility] Residential Treatment [place],    [place]  [phone]   [facility]  [address] [place] [phone], [place], [place]   [place], [place] [phone] Admissions: 8am-3pm M-F  Incentives Substance [place] [address]    [place], [place] [phone]   [facility] [place] #  Suzanne Boron, [place]   [facility] [address]  [place], [place]   [facility] - Intensive Outpatient [address] Krist[address], [place], [place]   [facility] ([place].,  [place], [place] [phone] or [phone]   [facility] Services (RTS) [address], [place], [place] Accepts Medicaid  [place] [place] [address],  [place] [phone] Substance Abuse/Addiction Treatment   [facility] Organization         Address  Phone  Notes  [facility]  [phone]   Domenic Schwab, PhD [address] Arlis Porta [place], [place]   [phone] or [phone]   [place], [place] [phone]   [address], [place], [place] [phone] Insurance/Medicaid/sponsorship through [facility] and Families [address]                                    [place], [place] [phone] [place] [address] [place], [place] [phone]    Dr. Adele Schilder  [phone]   [facility] Dept. 1) [address] [place] 2) [place] 3)  [address], [place] [phone]  [phone]   [place] [phone] or [phone] (After Hours)

## 2015-03-10 NOTE — ED Notes (Signed)
See PA note for secondary assessment.   

## 2015-03-10 NOTE — ED Provider Notes (Signed)
CSN: [id number]     Arrival date & time 03/10/15  2123 History   First MD Initiated Contact with Patient 03/10/15 2148     Chief Complaint  Patient presents with  . Foot Pain   (Consider location/radiation/quality/duration/timing/severity/associated sxs/prior Treatment) Patient is a 50 y.o. male presenting with lower extremity pain. The history is provided by the patient. No language interpreter was used.  Foot Pain Associated symptoms include arthralgias and joint swelling. Pertinent negatives include no fever or numbness.    Mr. Fairless is a 50 year old male with no significant past medical history pertaining to complain who presents for gradual onset foot pain that is worsened with ambulation and began 3 months ago. He denies any recent injury but states he dropped a pot on it about 3 years ago. He says it was never examined but blood underneath the toenail. Denies any history of gout or diabetes. He states he works 2 jobs and stands all day. Denies alcohol use.  Denies any fever, chills.  Past Medical History  Diagnosis Date  . Bronchitis   . Bronchitis    History reviewed. No pertinent past surgical history. History reviewed. No pertinent family history. Social History  Substance Use Topics  . Smoking status: Former Smoker -- 0.00 packs/day    Types: Cigarettes  . Smokeless tobacco: Never Used  . Alcohol Use: No    Review of Systems  Constitutional: Negative for fever.  Musculoskeletal: Positive for joint swelling, arthralgias and gait problem.  Neurological: Negative for numbness.      Allergies  Tramadol and Vicodin  Home Medications   Prior to Admission medications   Medication Sig Start Date End Date Taking? Authorizing Provider  amoxicillin-clavulanate (AUGMENTIN) 875-125 MG tablet Take 1 tablet by mouth every 12 (twelve) hours. 11/21/14   Harvel Quale, MD  diazepam (VALIUM) 5 MG tablet Take 1 tablet (5 mg total) by mouth every 6 (six) hours as needed for  muscle spasms. 11/21/14   Harvel Quale, MD  ibuprofen (ADVIL,MOTRIN) 600 MG tablet Take 1 tablet (600 mg total) by mouth every 6 (six) hours as needed. 11/21/14   Harvel Quale, MD  indomethacin (INDOCIN) 25 MG capsule Take 2 capsules (50 mg total) by mouth 3 (three) times daily with meals. 03/10/15   Izaah Westman Patel-Mills, PA-C   BP 143/95 mmHg  Pulse 85  Temp(Src) 98.7 F (37.1 C) (Oral)  Resp 18  Ht 5\' 9"  (1.753 m)  Wt 101.209 kg  BMI 32.93 kg/m2  SpO2 100% Physical Exam  Constitutional: He is oriented to person, place, and time. He appears well-developed and well-nourished.  HENT:  Head: Normocephalic and atraumatic.  Eyes: Conjunctivae are normal.  Neck: Normal range of motion.  Cardiovascular: Normal rate.   Pulmonary/Chest: Effort normal. No respiratory distress.  Musculoskeletal: Normal range of motion.  Ambulatory with limping gait favoring the right foot. Left foot: Pain along the lateral PIP joint with redness, warmth, and tenderness with light touch. Able to flex toes but with pain. 2+ DP pulse. Less than 2 second capillary refill. No pallor.  Neurological: He is alert and oriented to person, place, and time.  Skin: Skin is warm and dry.  Psychiatric: He has a normal mood and affect.  Nursing note and vitals reviewed.   ED Course  Procedures (including critical care time) Labs Review Labs Reviewed - No data to display  Imaging Review Dg Toe Great Left  03/10/2015  CLINICAL DATA:  Left great toe pain. Remote injury, dropped a  vase on the toe 5 years prior. EXAM: LEFT GREAT TOE COMPARISON:  None. FINDINGS: No acute or evidence of remote fracture. Osteoarthritis at the first metatarsal phalangeal joint with osteophytes and joint space narrowing. No bony destructive change or findings of inflammatory arthropathy. No focal soft tissue abnormality. IMPRESSION: Osteoarthritis of the first metatarsal phalangeal joint. No acute osseous abnormality. Electronically Signed   By:  Jeb Levering M.D.   On: 03/10/2015 23:10   I have personally reviewed and evaluated these image results as part of my medical decision-making.   EKG Interpretation None      MDM   Final diagnoses:  Acute gout of left foot, unspecified cause  Osteoarthritis of left foot, unspecified osteoarthritis type   Patient presents for left PIP joint tenderness in the foot 3 months. Denies any recent injury. Exam is consistent with gout. He denies any alcohol use. Xray of the toe shows osteoarthritis in the first MTP joint. He also has osteophytes and joint space narrowing. No destructive changes or inflammatory arthropathy. I suspect the patient may also have gout. He was put on indomethacin. I discussed follow-up with his PCP. He was given diet restrictions. Return precautions were also discussed and patient agrees with plan.  Filed Vitals:   03/10/15 2137 03/10/15 2323  BP: 149/104 143/95  Pulse: 81 85  Temp: 98.7 F (37.1 C)   Resp: 14 [address] PA-C 03/10/15 2324  Blanchie Dessert, MD 03/12/15 [phone]

## 2015-03-10 NOTE — ED Notes (Signed)
Patient here with complaint of left medial foot pain just proximal to great toe. Denies injury recently. States a pot fell on that foot in 2012 and it was never examined. Ambulatory, but exacerbates pain. Denies history of gout or diabetes. Area appears swollen.

## 2015-03-13 ENCOUNTER — Emergency Department (HOSPITAL_COMMUNITY)
Admission: EM | Admit: 2015-03-13 | Discharge: 2015-03-13 | Disposition: A | Discharge status: Home / Self Care | Payer: PRIVATE HEALTH INSURANCE | Attending: Emergency Medicine | Admitting: Emergency Medicine

## 2015-03-13 ENCOUNTER — Encounter (HOSPITAL_COMMUNITY): Payer: Self-pay | Admitting: Cardiology

## 2015-03-13 DIAGNOSIS — M10072 Idiopathic gout, left ankle and foot: Secondary | ICD-10-CM | POA: Diagnosis not present

## 2015-03-13 DIAGNOSIS — Z79899 Other long term (current) drug therapy: Secondary | ICD-10-CM | POA: Diagnosis not present

## 2015-03-13 DIAGNOSIS — M109 Gout, unspecified: Secondary | ICD-10-CM

## 2015-03-13 DIAGNOSIS — Z8709 Personal history of other diseases of the respiratory system: Secondary | ICD-10-CM | POA: Diagnosis not present

## 2015-03-13 DIAGNOSIS — Z87891 Personal history of nicotine dependence: Secondary | ICD-10-CM | POA: Insufficient documentation

## 2015-03-13 DIAGNOSIS — M79672 Pain in left foot: Secondary | ICD-10-CM | POA: Diagnosis present

## 2015-03-13 MED ORDER — PREDNISONE 20 MG PO TABS: ORAL_TABLET | ORAL | Status: DC

## 2015-03-13 NOTE — ED Provider Notes (Signed)
History  By signing my name below, I, Marlowe Kays, attest that this documentation has been prepared under the direction and in the presence of [address] [employer]. Electronically Signed: Marlowe Kays, ED Scribe. 03/13/2015. 11:43 AM.  Chief Complaint  Patient presents with  . Foot Pain  . Gout   Patient is a 50 y.o. male presenting with lower extremity pain. The history is provided by the patient. No language interpreter was used.  Foot Pain This is a new problem. The current episode started more than 2 days ago. The problem occurs constantly. The problem has not changed since onset.Pertinent negatives include no chest pain, no abdominal pain and no shortness of breath. The symptoms are aggravated by walking and standing. Nothing relieves the symptoms. He has tried acetaminophen for the symptoms. The treatment provided no relief.    HPI Comments:  Terry Poole is a 50 y.o. male who presents to the Emergency Department complaining of severe medial left great toe and left foot pain that began about 4 days ago. He reports associated warmth and erythema of the area. He describes the pain as constant throbbing nonradiating and rates it at 9.5/10. Pt states he was diagnosed with gout three days ago. Wearing shoes increases the pain. He denies alleviating factors. He has been taking Tylenol and Indocin with minimal relief of the pain. Started the indocin 2 days ago. He denies numbness, tingling or weakness of the left great toe or left foot, leg swelling, fever, chills, CP, SOB, abd pain, nausea, or vomiting. He does not have a PCP. He denies h/o DM.  Past Medical History  Diagnosis Date  . Bronchitis   . Bronchitis    History reviewed. No pertinent past surgical history. History reviewed. No pertinent family history. Social History  Substance Use Topics  . Smoking status: Former Smoker -- 0.00 packs/day    Types: Cigarettes  . Smokeless tobacco: Never Used  . Alcohol Use: No     Review of Systems  Constitutional: Negative for fever and chills.  Respiratory: Negative for shortness of breath.   Cardiovascular: Negative for chest pain and leg swelling.  Gastrointestinal: Negative for nausea, vomiting and abdominal pain.  Musculoskeletal: Positive for joint swelling and arthralgias.  Skin: Positive for color change. Negative for wound.  Allergic/Immunologic: Negative for immunocompromised state.  Neurological: Negative for weakness and numbness.   A complete 10 system review of systems was obtained and all systems are negative except as noted in the HPI and PMH.   Allergies  Tramadol and Vicodin  Home Medications   Prior to Admission medications   Medication Sig Start Date End Date Taking? Authorizing Provider  amoxicillin-clavulanate (AUGMENTIN) 875-125 MG tablet Take 1 tablet by mouth every 12 (twelve) hours. 11/21/14   Harvel Quale, MD  diazepam (VALIUM) 5 MG tablet Take 1 tablet (5 mg total) by mouth every 6 (six) hours as needed for muscle spasms. 11/21/14   Harvel Quale, MD  ibuprofen (ADVIL,MOTRIN) 600 MG tablet Take 1 tablet (600 mg total) by mouth every 6 (six) hours as needed. 11/21/14   Harvel Quale, MD  indomethacin (INDOCIN) 25 MG capsule Take 2 capsules (50 mg total) by mouth 3 (three) times daily with meals. 03/10/15   Hanna Patel-Mills, PA-C  predniSONE (DELTASONE) 20 MG tablet 3 tabs po daily x 3 days 03/13/15   Ellyn Rubiano Camprubi-Soms, PA-C   Triage Vitals: BP 134/111 mmHg  Pulse 83  Temp(Src) 97.7 F (36.5 C) (Oral)  Resp 15  Ht  5\' 9"  (1.753 m)  Wt 223 lb (101.152 kg)  BMI 32.92 kg/m2  SpO2 99% Physical Exam  Constitutional: He is oriented to person, place, and time. Vital signs are normal. He appears well-developed and well-nourished.  Non-toxic appearance. No distress.  Afebrile, nontoxic, NAD  HENT:  Head: Normocephalic and atraumatic.  Mouth/Throat: Mucous membranes are normal.  Eyes: Conjunctivae and EOM are normal.  Right eye exhibits no discharge. Left eye exhibits no discharge.  Neck: Normal range of motion. Neck supple.  Cardiovascular: Normal rate and intact distal pulses.   Pulmonary/Chest: Effort normal. No respiratory distress.  Abdominal: Normal appearance. He exhibits no distension.  Musculoskeletal:       Left foot: There is decreased range of motion (due to pain), tenderness, bony tenderness and swelling. There is normal capillary refill, no deformity and no laceration.       Feet:  Left foot swelling and erythema to first MTP joint, with focal tenderness, mild warmth, with ROM slightly diminished due to pain but pt still able to wiggle toes, sensation grossly intact. Distal pulses intact. Dorsiflexion and plantar flexion intact. No pedal edema  Neurological: He is alert and oriented to person, place, and time. He has normal strength. No sensory deficit.  Skin: Skin is warm, dry and intact. No rash noted. There is erythema.  Left foot erythema as noted above.  Psychiatric: He has a normal mood and affect.  Nursing note and vitals reviewed.   ED Course  Procedures (including critical care time) DIAGNOSTIC STUDIES: Oxygen Saturation is 99% on RA, normal by my interpretation.   COORDINATION OF CARE: 11:39 AM- Will give resources to follow up with [facility]. Will prescribe Prednisone. Pt verbalizes understanding and agrees to plan.  Medications - No data to display  Imaging Review No results found. I have personally reviewed and evaluated these images and lab results as part of my medical decision-making.  Dg Toe Great Left  03/10/2015  CLINICAL DATA:  Left great toe pain. Remote injury, dropped a vase on the toe 5 years prior. EXAM: LEFT GREAT TOE COMPARISON:  None. FINDINGS: No acute or evidence of remote fracture. Osteoarthritis at the first metatarsal phalangeal joint with osteophytes and joint space narrowing. No bony destructive change or findings of inflammatory  arthropathy. No focal soft tissue abnormality. IMPRESSION: Osteoarthritis of the first metatarsal phalangeal joint. No acute osseous abnormality. Electronically Signed   By: Jeb Levering M.D.   On: 03/10/2015 23:10    MDM   Final diagnoses:  Acute gout of left foot, unspecified cause    50 y.o. male here with ongoing pain associated with gout of L foot. Started indocin 2 days ago, unrelieved. On exam, no pedal edema, NVI with soft compartments, erythema and warmth to the 1st MTP joint, consistent with gout. Doubt septic joint or DVT. Xray obtained at last visit showed OA but no acute changes. Will start on short course of prednisone to augment indocin. Post op shoe given. Discussed RICE. F/up with [facility] in 1wk to establish care and f/up on today's visit. Diet modifications discussed. I explained the diagnosis and have given explicit precautions to return to the ER including for any other new or worsening symptoms. The patient understands and accepts the medical plan as it's been dictated and I have answered their questions. Discharge instructions concerning home care and prescriptions have been given. The patient is STABLE and is discharged to home in good condition.   I personally performed the services described  in this documentation, which was scribed in my presence. The recorded information has been reviewed and is accurate.  BP 134/111 mmHg  Pulse 83  Temp(Src) 97.7 F (36.5 C) (Oral)  Resp 15  Ht 5\' 9"  (1.753 m)  Wt 101.152 kg  BMI 32.92 kg/m2  SpO2 99%  Meds ordered this encounter  Medications  . predniSONE (DELTASONE) 20 MG tablet    Sig: 3 tabs po daily x 3 days    Dispense:  9 tablet    Refill:  0    Order Specific Question:  Supervising Provider    Answer:  Jenny Reichmann Camprubi-Soms, PA-C 03/13/15 [place], PA-C 03/13/15 [place], MD 03/13/15 1536

## 2015-03-13 NOTE — ED Notes (Signed)
Pt seen here 03/10/2015 and dx'd with gout. Just got Indocin Rx filled yesterday. States left foot pain has worsened. Appears swollen and red.

## 2015-03-13 NOTE — ED Notes (Signed)
Pt reports he was dx with gout on Saturday, and given medication but the pain is not any better. Reports pain in the left great toe.

## 2015-03-13 NOTE — Discharge Instructions (Signed)
Use post-op shoe as needed for comfort. Ice and elevate foot throughout the day. Use indocin as directed at your last visit, and start prednisone as directed to help with the gout flare. Avoid alcohol intake or seafood intake. See the list of foods below to help prevent gout flares. Follow up with [facility] in 1 week for recheck of symptoms and to establish care. Return to the ER for changes or worsening symptoms.    Gout Gout is when your joints become red, sore, and swell (inflamed). This is caused by the buildup of uric acid crystals in the joints. Uric acid is a chemical that is normally in the blood. If the level of uric acid gets too high in the blood, these crystals form in your joints and tissues. Over time, these crystals can form into masses near the joints and tissues. These masses can destroy bone and cause the bone to look misshapen (deformed). HOME CARE   Do not take aspirin for pain.  Only take medicine as told by your doctor.  Rest the joint as much as you can. When in bed, keep sheets and blankets off painful areas.  Keep the sore joints raised (elevated).  Put warm or cold packs on painful joints. Use of warm or cold packs depends on which works best for you.  Use crutches if the painful joint is in your leg.  Drink enough fluids to keep your pee (urine) clear or pale yellow. Limit alcohol, sugary drinks, and drinks with fructose in them.  Follow your diet instructions. Pay careful attention to how much protein you eat. Include fruits, vegetables, whole grains, and fat-free or low-fat milk products in your daily diet. Talk to your doctor or dietitian about the use of coffee, vitamin C, and cherries. These may help lower uric acid levels.  Keep a healthy body weight. GET HELP RIGHT AWAY IF:   You have watery poop (diarrhea), throw up (vomit), or have any side effects from medicines.  You do not feel better in 24 hours, or you are getting worse.  Your  joint becomes suddenly more tender, and you have chills or a fever. MAKE SURE YOU:   Understand these instructions.  Will watch your condition.  Will get help right away if you are not doing well or get worse.   This information is not intended to replace advice given to you by your health care provider. Make sure you discuss any questions you have with your health care provider.   Document Released: 10/30/2007 Document Revised: 02/10/2014 Document Reviewed: 09/03/2011 Elsevier Interactive Patient Education 2016 [place] are compounds that affect the level of uric acid in your body. A low-purine diet is a diet that is low in purines. Eating a low-purine diet can prevent the level of uric acid in your body from getting too high and causing gout or kidney stones or both. WHAT DO I NEED TO KNOW ABOUT THIS DIET?  Choose low-purine foods. Examples of low-purine foods are listed in the next section.  Drink plenty of fluids, especially water. Fluids can help remove uric acid from your body. Try to drink 8-16 cups (1.9-3.8 L) a day.  Limit foods high in fat, especially saturated fat, as fat makes it harder for the body to get rid of uric acid. Foods high in saturated fat include pizza, cheese, ice cream, whole milk, fried foods, and gravies. Choose foods that are lower in fat and lean sources of protein.  Use olive oil when cooking as it contains healthy fats that are not high in saturated fat.  Limit alcohol. Alcohol interferes with the elimination of uric acid from your body. If you are having a gout attack, avoid all alcohol.  Keep in mind that different people's bodies react differently to different foods. You will probably learn over time which foods do or do not affect you. If you discover that a food tends to cause your gout to flare up, avoid eating that food. You can more freely enjoy foods that do not cause problems. If you have any questions about a food  item, talk to your dietitian or health care provider. WHICH FOODS ARE LOW, MODERATE, AND HIGH IN PURINES? The following is a list of foods that are low, moderate, and high in purines. You can eat any amount of the foods that are low in purines. You may be able to have small amounts of foods that are moderate in purines. Ask your health care provider how much of a food moderate in purines you can have. Avoid foods high in purines. Grains  Foods low in purines: Enriched white bread, pasta, rice, cake, cornbread, popcorn.  Foods moderate in purines: Whole-grain breads and cereals, wheat germ, bran, oatmeal. Uncooked oatmeal. Dry wheat bran or wheat germ.  Foods high in purines: Pancakes, [place] toast, biscuits, muffins. Vegetables  Foods low in purines: All vegetables, except those that are moderate in purines.  Foods moderate in purines: Asparagus, cauliflower, spinach, mushrooms, green peas. Fruits  All fruits are low in purines. Meats and other Protein Foods  Foods low in purines: Eggs, nuts, peanut butter.  Foods moderate in purines: 80-90% lean beef, lamb, veal, pork, poultry, fish, eggs, peanut butter, nuts. Crab, lobster, oysters, and shrimp. Cooked dried beans, peas, and lentils.  Foods high in purines: Anchovies, sardines, herring, mussels, tuna, codfish, scallops, trout, and haddock. Berniece Salines. Organ meats (such as liver or kidney). Tripe. Game meat. Goose. Sweetbreads. Dairy  All dairy foods are low in purines. Low-fat and fat-free dairy products are best because they are low in saturated fat. Beverages  Drinks low in purines: Water, carbonated beverages, tea, coffee, cocoa.  Drinks moderate in purines: Soft drinks and other drinks sweetened with high-fructose corn syrup. Juices. To find whether a food or drink is sweetened with high-fructose corn syrup, look at the ingredients list.  Drinks high in purines: Alcoholic beverages (such as beer). Condiments  Foods low in  purines: Salt, herbs, olives, pickles, relishes, vinegar.  Foods moderate in purines: Butter, margarine, oils, mayonnaise. Fats and Oils  Foods low in purines: All types, except gravies and sauces made with meat.  Foods high in purines: Gravies and sauces made with meat. Other Foods  Foods low in purines: Sugars, sweets, gelatin. Cake. Soups made without meat.  Foods moderate in purines: Meat-based or fish-based soups, broths, or bouillons. Foods and drinks sweetened with high-fructose corn syrup.  Foods high in purines: High-fat desserts (such as ice cream, cookies, cakes, pies, doughnuts, and chocolate). Contact your dietitian for more information on foods that are not listed here.   This information is not intended to replace advice given to you by your health care provider. Make sure you discuss any questions you have with your health care provider.   Document Released: 05/17/2010 Document Revised: 01/25/2013 Document Reviewed: 12/27/2012 Elsevier Interactive Patient Education [employer].

## 2015-03-21 ENCOUNTER — Encounter (HOSPITAL_COMMUNITY): Payer: Self-pay | Admitting: Family Medicine

## 2015-03-21 ENCOUNTER — Emergency Department (HOSPITAL_COMMUNITY)
Admission: EM | Admit: 2015-03-21 | Discharge: 2015-03-21 | Disposition: A | Discharge status: Home / Self Care | Payer: PRIVATE HEALTH INSURANCE | Attending: Emergency Medicine | Admitting: Emergency Medicine

## 2015-03-21 DIAGNOSIS — M109 Gout, unspecified: Secondary | ICD-10-CM

## 2015-03-21 DIAGNOSIS — Z87891 Personal history of nicotine dependence: Secondary | ICD-10-CM | POA: Insufficient documentation

## 2015-03-21 DIAGNOSIS — M10072 Idiopathic gout, left ankle and foot: Secondary | ICD-10-CM | POA: Diagnosis not present

## 2015-03-21 DIAGNOSIS — Z791 Long term (current) use of non-steroidal anti-inflammatories (NSAID): Secondary | ICD-10-CM | POA: Diagnosis not present

## 2015-03-21 DIAGNOSIS — M79675 Pain in left toe(s): Secondary | ICD-10-CM | POA: Diagnosis present

## 2015-03-21 DIAGNOSIS — Z8709 Personal history of other diseases of the respiratory system: Secondary | ICD-10-CM | POA: Diagnosis not present

## 2015-03-21 HISTORY — DX: Gout, unspecified: M10.9

## 2015-03-21 MED ORDER — COLCHICINE 0.6 MG PO TABS: 0.6000 mg | ORAL_TABLET | Freq: Once | ORAL | Status: DC

## 2015-03-21 MED ORDER — COLCHICINE 0.6 MG PO TABS: 0.6000 mg | ORAL_TABLET | Freq: Every day | ORAL | Status: DC

## 2015-03-21 MED ORDER — COLCHICINE 0.6 MG PO TABS
1.2000 mg | ORAL_TABLET | Freq: Once | ORAL | Status: AC
  Administered 2015-03-21: 1.2 mg via ORAL
  Filled 2015-03-21: qty 2

## 2015-03-21 NOTE — Discharge Instructions (Signed)

## 2015-03-21 NOTE — ED Provider Notes (Signed)
CSN: [id number]     Arrival date & time 03/21/15  1353 History  By signing my name below, I, Terry Poole, attest that this documentation has been prepared under the direction and in the presence of Terry Leventhal, PA-C. Electronically Signed: Starleen Poole ED Scribe. 03/21/2015. 3:33 PM.    Chief Complaint  Patient presents with  . Gout   The history is provided by the patient. No language interpreter was used.   HPI Comments: Terry Poole is a 50 y.o. male who presents to the Emergency Department complaining of constant, moderate, left great toe pain onset two weeks ago. The pain is a constant throbbing and associated with toe swelling. The pain does not radiate. The pain is exacerbated by wearing shoes and relieved by wearing his post-op shoe and resting his foot. He states that the toe was previously warm and red but this has resolved with indomethacin. The patient was evaluated in the ED on 2/4 and 2/7 for the same complaint. He has been prescribed indocin with compliance and a short course of prednisone. He has RICE'd the complaint and has also been wearing a post-op shoe. He states that the pain has improved moderately since onset 2 weeks ago but he is frustrated that shoes still cause him pain. He states that he cannot wear shoes therefore he has not been able to work. He has not followed up with a PCP since first being diagnosed with gout. Denies fevers, chills, nausea, vomiting, myalgias, inability to move the toes, numbness, loss of sensation or weakness of the left foot. He has no other complaints today.   Past Medical History  Diagnosis Date  . Bronchitis   . Bronchitis   . Gout    History reviewed. No pertinent past surgical history. History reviewed. No pertinent family history. Social History  Substance Use Topics  . Smoking status: Former Smoker -- 0.00 packs/day    Types: Cigarettes  . Smokeless tobacco: Never Used  . Alcohol Use: No    Review of Systems  Musculoskeletal:  Positive for joint swelling and arthralgias.  Skin: Negative for color change.  All other systems reviewed and are negative.    Allergies  Tramadol and Vicodin  Home Medications   Prior to Admission medications   Medication Sig Start Date End Date Taking? Authorizing Provider  amoxicillin-clavulanate (AUGMENTIN) 875-125 MG tablet Take 1 tablet by mouth every 12 (twelve) hours. 11/21/14   Harvel Quale, MD  colchicine 0.6 MG tablet Take 1 tablet (0.6 mg total) by mouth daily. Take once a day starting tomorrow (2/16). You have received your first dose in the ER today (2/15) 03/21/15   Nyomi Howser, PA-C  diazepam (VALIUM) 5 MG tablet Take 1 tablet (5 mg total) by mouth every 6 (six) hours as needed for muscle spasms. 11/21/14   Harvel Quale, MD  ibuprofen (ADVIL,MOTRIN) 600 MG tablet Take 1 tablet (600 mg total) by mouth every 6 (six) hours as needed. 11/21/14   Harvel Quale, MD  indomethacin (INDOCIN) 25 MG capsule Take 2 capsules (50 mg total) by mouth 3 (three) times daily with meals. 03/10/15   Hanna Patel-Mills, PA-C  predniSONE (DELTASONE) 20 MG tablet 3 tabs po daily x 3 days 03/13/15   Mercedes Camprubi-Soms, PA-C   BP 194/0 mmHg  Pulse 77  Temp(Src) 98.8 F (37.1 C) (Oral)  Resp 16  SpO2 100% Physical Exam  Constitutional: He is oriented to person, place, and time. He appears well-developed and well-nourished. No distress.  Nontoxic appearing  HENT:  Head: Normocephalic and atraumatic.  Eyes: Conjunctivae and EOM are normal.  Neck: Neck supple. No tracheal deviation present.  Cardiovascular: Normal rate.   Pulmonary/Chest: Effort normal. No respiratory distress.  Musculoskeletal:       Left foot: There is decreased range of motion, tenderness and swelling. There is normal capillary refill and no deformity.       Feet:  Mild swelling at at first MTP joint with tenderness. ROM slightly diminished secondary to pain. Pt is still able to moves all toes and walks with a  steady gait. No overlying warmth or erythema of the MTP joint. No effusion. No swelling of the midfoot. Pedal pulse palpable. Cap refill < 3 seconds. Sensation to light touch intact throughout. 5/5 ankle strength b/l  Neurological: He is alert and oriented to person, place, and time.  Skin: Skin is warm and dry.  Psychiatric: He has a normal mood and affect. His behavior is normal.  Nursing note and vitals reviewed.   ED Course  Procedures (including critical care time)  DIAGNOSTIC STUDIES: Oxygen Saturation is 97% on RA, normal by my interpretation.    COORDINATION OF CARE:  3:48 PM Discussed treatment plan with patient at bedside.  Patient acknowledges and agrees with plan.    Labs Review Labs Reviewed - No data to display  Imaging Review No results found. I have personally reviewed and evaluated these images and lab results as part of my medical decision-making.   EKG Interpretation None      MDM   Final diagnoses:  Acute gout of left foot, unspecified cause   Pt presents with monoarticular pain and swelling. Pt has known history of gout and is taking indomethacin and prednisone burst without full resolution of symptoms. Pt is afebrile and hemodynamically stable. Patient retains FROM of the joint. There is no warmth or signs of septic joint. Chart reviewed and pt has been evaluated two times for same complaint. Previous physical exam findings appear to be improving when compared to presentation today. DG foot shows OA without acute disease process. Pt reports indomethacin and prednisone have improved the symptoms but not fully resolved them. His main complaint today is that he cannot put regular shoes on yet. He has not followed up with a PCP since last ED discharge. Case management assisted pt in scheduling PCP follow [facility] with [facility] for a visit in 1 week. Pt without known peptic ulcer disease and not receiving concurrent treatment on warfarin. Pt discharge  with colchicine. Instructed patient to follow up with their PCP as scheduled. Return precautions given in discharge paperwork and discussed with pt at bedside. Pt stable for discharge   I personally performed the services described in this documentation, which was scribed in my presence. The recorded information has been reviewed and is accurate.    Lahoma Crocker Erdem Naas, PA-C 03/21/15 1854  Veryl Speak, MD 03/22/15 [phone]

## 2015-03-21 NOTE — ED Notes (Signed)
Declined W/C at D/C and was escorted to lobby by RN. 

## 2015-03-21 NOTE — ED Notes (Signed)
Pt here for gout flare up in left big toe. sts he has been wearing a boot on foot and helping some until he puts regular shoes on.

## 2015-03-28 ENCOUNTER — Encounter: Payer: Self-pay | Admitting: Family Medicine

## 2015-03-28 ENCOUNTER — Ambulatory Visit: Payer: PRIVATE HEALTH INSURANCE | Attending: Family Medicine | Admitting: Family Medicine

## 2015-03-28 VITALS — BP 126/79 | HR 89 | Temp 98.5°F | Resp 18 | Ht 69.0 in | Wt 222.0 lb

## 2015-03-28 DIAGNOSIS — M109 Gout, unspecified: Secondary | ICD-10-CM | POA: Insufficient documentation

## 2015-03-28 DIAGNOSIS — M1000 Idiopathic gout, unspecified site: Secondary | ICD-10-CM | POA: Diagnosis not present

## 2015-03-28 DIAGNOSIS — Z0000 Encounter for general adult medical examination without abnormal findings: Secondary | ICD-10-CM

## 2015-03-28 MED ORDER — NAPROXEN 500 MG PO TABS: 500.0000 mg | ORAL_TABLET | Freq: Two times a day (BID) | ORAL | Status: DC

## 2015-03-28 NOTE — Patient Instructions (Signed)
It was a pleasure to see you today.   For the Left foot pain, change to NAPROXEN 500mg , take 1 tablet by mouth twice daily with food. DO NOT TAKE the indomethacin, or ibuprofen, while taking this new medicine.   We are checking labs today.   Follow up in the clinic in 1 to 2 weeks.

## 2015-03-28 NOTE — Progress Notes (Signed)
   Subjective:    Patient ID: Terry Poole, male    DOB: 1965/08/03, 50 y.o.   MRN: [id number]  HPI Patient here for follow up gouty attack L great toe, seen in ED on 03/21/15 and discharge on prednisone, colchicine. Had been taking indocin 50mg  tid which helped; has improved since ED visit but ran out of all meds 2-3 days ago and still cannot put foot in shoe.  Works on [employer] in [place], also cleaning/maintenance work.    Today he reports that the pain is better, much less inflamed than it was previously.   He is asking for paperwork to return to work in the beginning of ApRIL due to the gouty attack.   Review of Systems Denies fever or chills, no radiating pain or pain in other joints. No prior episodes of gout.  No trauma. No history of ulcer disease or intolerance of NSAIDs.      Objective:   Physical Exam Well appearing, no distress HEENT neck supple.  MSK: LEFT great toe with swelling and trace erythema around MTP joint; sensation in distal toe intact. Brisk cap refill in great toe on L foot.  Palpable dp pulse in L foot. In post-op shoe. Able to dorsi/plantar flex the L ankle without difficulty or limitation. No ankle edema; no tenderness to squeeze malleoli of L ankle.        Assessment & Plan:  1. L great toe pain and erythema which has improved with NSAIDs and prednisone use.  Now out of meds for 3 days. To change to twice-daily NAPROXEN 500mg ; uric acid and BMet now (urate may be low during acute attack, but to check for baseline). May wish to recheck about 2-4 weeks after resolution of acute attack.  BMet to verify renal function.   Follow up in 1-2 weeks.  Dalbert Mayotte, MD

## 2015-03-28 NOTE — Progress Notes (Signed)
Patient is here for FU Gout  Patient complains of gout pain in left foot. Patient scales pain at a 9 currently. Pain is described as throbbing and aching. Patient has completed all medications which were given for this condition. Patient states he is not able to wear regular shoes due to material rubbing the bone.  Patient presented with Insurance paperwork to be completed by provider.  Patient would like his flu shot today.

## 2015-03-29 ENCOUNTER — Telehealth: Payer: Self-pay | Admitting: *Deleted

## 2015-03-29 LAB — BASIC METABOLIC PANEL
BUN: 14 mg/dL (ref 7–25)
CALCIUM: 9.7 mg/dL (ref 8.6–10.3)
CO2: 24 mmol/L (ref 20–31)
Chloride: 104 mmol/L (ref 98–110)
Creat: 0.92 mg/dL (ref 0.60–1.35)
GLUCOSE: 98 mg/dL (ref 65–99)
POTASSIUM: 4 mmol/L (ref 3.5–5.3)
Sodium: 138 mmol/L (ref 135–146)

## 2015-03-29 LAB — URIC ACID: Uric Acid, Serum: 8.5 mg/dL — ABNORMAL HIGH (ref 4.0–7.8)

## 2015-03-29 MED FILL — NAPROXEN 500 MG TABLET: 500 | 30 days supply | Qty: 60 | Fill #0

## 2015-03-29 NOTE — Telephone Encounter (Signed)
Patient verified DOB Patient is aware of metabolic panel and kidney function being normal. Patient aware of uric acid being elevated confirming gout. Patient advised to continue current therapy and follow up in 2 weeks for a recheck. Patient expressed his understanding and had no further questions at this time.

## 2015-03-29 NOTE — Telephone Encounter (Signed)
-----   Message from Willeen Niece, MD sent at 03/29/2015  2:08 PM EST ----- Please let pt know his metabolic panel and kidney function are normal. Uric acid is elevated, supports diagnosis of gout. Continue current therapy.  JB

## 2015-04-16 ENCOUNTER — Ambulatory Visit: Payer: PRIVATE HEALTH INSURANCE | Attending: Family Medicine | Admitting: Family Medicine

## 2015-04-16 ENCOUNTER — Encounter: Payer: Self-pay | Admitting: Family Medicine

## 2015-04-16 VITALS — BP 148/92 | HR 84 | Temp 97.8°F | Resp 16 | Ht 69.0 in | Wt 232.8 lb

## 2015-04-16 DIAGNOSIS — L731 Pseudofolliculitis barbae: Secondary | ICD-10-CM | POA: Insufficient documentation

## 2015-04-16 DIAGNOSIS — Z79899 Other long term (current) drug therapy: Secondary | ICD-10-CM | POA: Diagnosis not present

## 2015-04-16 DIAGNOSIS — M1000 Idiopathic gout, unspecified site: Secondary | ICD-10-CM

## 2015-04-16 DIAGNOSIS — L600 Ingrowing nail: Secondary | ICD-10-CM | POA: Diagnosis not present

## 2015-04-16 DIAGNOSIS — B351 Tinea unguium: Secondary | ICD-10-CM | POA: Diagnosis not present

## 2015-04-16 DIAGNOSIS — E119 Type 2 diabetes mellitus without complications: Secondary | ICD-10-CM | POA: Diagnosis not present

## 2015-04-16 LAB — POCT GLYCOSYLATED HEMOGLOBIN (HGB A1C): Hemoglobin A1C: 6.6

## 2015-04-16 MED ORDER — TRUE METRIX METER DEVI: 1.0000 | Freq: Every day | Status: DC

## 2015-04-16 MED ORDER — ALLOPURINOL 300 MG PO TABS: 300.0000 mg | ORAL_TABLET | Freq: Every day | ORAL | Status: DC

## 2015-04-16 MED ORDER — TRUEPLUS LANCETS 28G MISC: 1.0000 | Freq: Every day | Status: DC

## 2015-04-16 MED ORDER — GLUCOSE BLOOD VI STRP: ORAL_STRIP | Status: DC

## 2015-04-16 MED ORDER — COLCHICINE 0.6 MG PO TABS: ORAL_TABLET | ORAL | Status: DC

## 2015-04-16 MED FILL — ALLOPURINOL 300 MG TABLET: 300 | 30 days supply | Qty: 30 | Fill #0

## 2015-04-16 NOTE — Progress Notes (Signed)
Subjective:  Patient ID: Terry Poole, male    DOB: Sep 06, 1965  Age: 50 y.o. MRN: [id number]  CC: Establish Care   HPI Terry Poole is a 50 year old male with a history of gout (last uric acid 8.5 from 03/2015) who has been on naproxen and comes in today complaining of an acute flare 1 day in his left big toe. He denies recent ingestion of foods that trigger gout. Pain is 8/10. He also complains of pain in his left big toenail with associated darkened discoloration.  Diabetes screening also reveals a new diagnosis of type 2 diabetes mellitus with A1c of 6.6 and he endorses a positive family history of diabetes mellitus.  He also has some bumps in his beard area which she has noticed worsens whenever he she sees beards but denies any bumps in other body parts. Has no fever.  Outpatient Prescriptions Prior to Visit  Medication Sig Dispense Refill  . naproxen (NAPROSYN) 500 MG tablet Take 1 tablet (500 mg total) by mouth 2 (two) times daily with a meal. 60 tablet 0  . amoxicillin-clavulanate (AUGMENTIN) 875-125 MG tablet Take 1 tablet by mouth every 12 (twelve) hours. (Patient not taking: Reported on 03/28/2015) 20 tablet 0  . diazepam (VALIUM) 5 MG tablet Take 1 tablet (5 mg total) by mouth every 6 (six) hours as needed for muscle spasms. (Patient not taking: Reported on 03/28/2015) 10 tablet 0   No facility-administered medications prior to visit.    ROS Review of Systems  Constitutional: Negative for activity change and appetite change.  HENT: Negative for sinus pressure and sore throat.   Eyes: Negative for visual disturbance.  Respiratory: Negative for cough, chest tightness and shortness of breath.   Cardiovascular: Negative for chest pain and leg swelling.  Gastrointestinal: Negative for abdominal pain, diarrhea, constipation and abdominal distention.  Endocrine: Negative.   Genitourinary: Negative for dysuria.  Musculoskeletal:       See history of present illness  Skin:  Negative for rash.  Allergic/Immunologic: Negative.   Neurological: Negative for weakness, light-headedness and numbness.  Psychiatric/Behavioral: Negative for suicidal ideas and dysphoric mood.    Objective:  BP 148/92 mmHg  Pulse 84  Temp(Src) 97.8 F (36.6 C)  Resp 16  Ht 5\' 9"  (1.753 m)  Wt 232 lb 12.8 oz (105.597 kg)  BMI 34.36 kg/m2  SpO2 95%  BP/Weight 04/16/2015 03/28/2015 Q000111Q  Systolic BP 123456 [id number] [id number]  Diastolic BP 92 79 0  Wt. (Lbs) 232.8 222 -  BMI 34.36 32.77 -     Physical Exam  Constitutional: He is oriented to person, place, and time. He appears well-developed and well-nourished.  Cardiovascular: Normal rate, normal heart sounds and intact distal pulses.   No murmur heard. Pulmonary/Chest: Effort normal and breath sounds normal. He has no wheezes. He has no rales. He exhibits no tenderness.  Abdominal: Soft. Bowel sounds are normal. He exhibits no distension and no mass. There is no tenderness.  Musculoskeletal: He exhibits tenderness (tenderness on palpation of medial aspect of and nail of the left big toe). He exhibits no edema (No erythema of the left big toe).  Neurological: He is alert and oriented to person, place, and time.  Skin:  Hyperpigmented, thickened nail of the left big toe with mild tenderness to palpation     Assessment & Plan:   1. Type 2 diabetes mellitus without complication, without long-term current use of insulin (HCC) A1C of 6.6-newly diagnosed Educated about new diagnosis, diet controlled for now. Scheduled  to see clinical pharmacist for diabetic teaching - HgB A1c - Blood Glucose Monitoring Suppl (TRUE METRIX METER) DEVI; 1 each by Does not apply route daily before breakfast.  Dispense: 1 Device; Refill: 0 - glucose blood (TRUE METRIX BLOOD GLUCOSE TEST) test strip; Use daily before breakfast  Dispense: 30 each; Refill: 5 - TRUEPLUS LANCETS 28G MISC; 1 each by Does not apply route daily before breakfast.  Dispense: 30 each;  Refill: 5  2. Acute idiopathic gout, unspecified site - colchicine 0.6 MG tablet; Take 2 tabs (1.2mg ) at the onset of a gout attack, may repeat 1 tab (0.6mg ) after 1 hour if symptoms persist.  Dispense: 30 tablet; Refill: 2 - allopurinol (ZYLOPRIM) 300 MG tablet; Take 1 tablet (300 mg total) by mouth daily.  Dispense: 30 tablet; Refill: 2  3. Onychomycosis Scheduled to see podiatrist  4. Ingrown toenail Discussed warm soaks.  5. Pseudofolliculitis barbae Educated about prevention-infrequent shavings to prevent rapid development of problems.   Meds ordered this encounter  Medications  . colchicine 0.6 MG tablet    Sig: Take 2 tabs (1.2mg ) at the onset of a gout attack, may repeat 1 tab (0.6mg ) after 1 hour if symptoms persist.    Dispense:  30 tablet    Refill:  2  . allopurinol (ZYLOPRIM) 300 MG tablet    Sig: Take 1 tablet (300 mg total) by mouth daily.    Dispense:  30 tablet    Refill:  2  . Blood Glucose Monitoring Suppl (TRUE METRIX METER) DEVI    Sig: 1 each by Does not apply route daily before breakfast.    Dispense:  1 Device    Refill:  0  . glucose blood (TRUE METRIX BLOOD GLUCOSE TEST) test strip    Sig: Use daily before breakfast    Dispense:  30 each    Refill:  5  . TRUEPLUS LANCETS 28G MISC    Sig: 1 each by Does not apply route daily before breakfast.    Dispense:  30 each    Refill:  5    Follow-up: Return in about 1 month (around 05/17/2015) for follow up on gout; schedule with stacey- DM teaching; schedule with podiatry- ingrown toenail.   Arnoldo Morale MD

## 2015-04-16 NOTE — Progress Notes (Signed)
Follow up after seeing Dr. Lindell Noe Left toe pain 8/10 States the only medication he has taken is naprosyn Also has "rash" on his face that he has had for 2 weeks-he states this is the second time this has appeared on his face A1C 6.6

## 2015-04-18 ENCOUNTER — Telehealth: Payer: Self-pay | Admitting: Family Medicine

## 2015-04-18 ENCOUNTER — Other Ambulatory Visit: Payer: Self-pay | Admitting: Family Medicine

## 2015-04-18 MED ORDER — PREDNISONE 20 MG PO TABS: 20.0000 mg | ORAL_TABLET | Freq: Every day | ORAL | Status: DC

## 2015-04-18 MED ORDER — PREDNISONE 20 MG PO TABS
20.0000 mg | ORAL_TABLET | Freq: Every day | ORAL | Status: DC
Start: 2015-04-18 — End: 2015-04-18

## 2015-04-18 NOTE — Progress Notes (Signed)
Patient called complaining gout is not any better and so I have placed him on prednisone

## 2015-04-18 NOTE — Telephone Encounter (Signed)
Rx for prednisone sent to [facility].  Patient was called and he will pick up medications

## 2015-04-18 NOTE — Telephone Encounter (Signed)
Pt. Called stating that allopurinol (ZYLOPRIM) 300 MG tablet is not helping him at all. Pt. Was prescribed the medications on 04/16/15. Please f/u with pt.

## 2015-04-23 ENCOUNTER — Telehealth: Payer: Self-pay | Admitting: Family Medicine

## 2015-04-23 ENCOUNTER — Emergency Department (INDEPENDENT_AMBULATORY_CARE_PROVIDER_SITE_OTHER)
Admission: EM | Admit: 2015-04-23 | Discharge: 2015-04-23 | Disposition: A | Discharge status: Home / Self Care | Payer: PRIVATE HEALTH INSURANCE | Source: Home / Self Care | Attending: Family Medicine | Admitting: Family Medicine

## 2015-04-23 ENCOUNTER — Encounter (HOSPITAL_COMMUNITY): Payer: Self-pay | Admitting: Emergency Medicine

## 2015-04-23 DIAGNOSIS — M10072 Idiopathic gout, left ankle and foot: Secondary | ICD-10-CM | POA: Diagnosis not present

## 2015-04-23 DIAGNOSIS — M109 Gout, unspecified: Secondary | ICD-10-CM

## 2015-04-23 MED ORDER — PREDNISONE 20 MG PO TABS: 40.0000 mg | ORAL_TABLET | Freq: Every day | ORAL | Status: DC

## 2015-04-23 MED ORDER — NAPROXEN 500 MG PO TABS
500.0000 mg | ORAL_TABLET | Freq: Three times a day (TID) | ORAL | Status: DC
Start: 2015-04-23 — End: 2015-06-06

## 2015-04-23 NOTE — ED Notes (Signed)
Patient c/o gout flare in his left big toe onset last night. Patient reports he has been out of his medication since yesterday. Also has right leg pain radiating from his hip/lower back down his leg. Patient reports pain kept him from performing his job duties today.  Patient is in NAD.

## 2015-04-23 NOTE — Discharge Instructions (Signed)

## 2015-04-23 NOTE — Telephone Encounter (Signed)
Pt. Called stating that his feet are swollen. Pt. Stated he could not go to work this morning. Pt. Would like to know what to do. Please f/u with pt.

## 2015-04-23 NOTE — Telephone Encounter (Signed)
Verified name and date of birth.  Patient worked long hours on his feet yesterday.  Instructed him to take all medications and to elevate his feet above the level of his heart and apply ice.  He has appointment tomorrow for diabetic teaching and told him to come to that appointment and if he was no better to let [facility] know.  Advised him to go to Urgent Care of he had trouble breathing or felt like his situation became serious.  He verbalized understanding.

## 2015-04-23 NOTE — ED Notes (Signed)
Patient request Rx be called in to [place].

## 2015-04-23 NOTE — ED Provider Notes (Signed)
CSN: [id number]     Arrival date & time 04/23/15  1301 History   First MD Initiated Contact with Patient 04/23/15 1326     Chief Complaint  Patient presents with  . Gout   (Consider location/radiation/quality/duration/timing/severity/associated sxs/prior Treatment) HPI History obtained from patient:   LOCATION:left foot Great toe SEVERITY:6 DURATION:2 days CONTEXT:sudden onset QUALITY:similar to previous gout attack MODIFYING FACTORS:rest and elevation ASSOCIATED SYMPTOMS:painful to walk TIMING:constant OCCUPATION:  Past Medical History  Diagnosis Date  . Bronchitis   . Bronchitis   . Gout    History reviewed. No pertinent past surgical history. History reviewed. No pertinent family history. Social History  Substance Use Topics  . Smoking status: Former Smoker -- 0.00 packs/day    Types: Cigarettes  . Smokeless tobacco: Never Used  . Alcohol Use: No    Review of Systems Left foot great toe pain Allergies  Tramadol and Vicodin  Home Medications   Prior to Admission medications   Medication Sig Start Date End Date Taking? Authorizing Provider  colchicine 0.6 MG tablet Take 2 tabs (1.2mg ) at the onset of a gout attack, may repeat 1 tab (0.6mg ) after 1 hour if symptoms persist. 04/16/15  Yes Arnoldo Morale, MD  allopurinol (ZYLOPRIM) 300 MG tablet Take 1 tablet (300 mg total) by mouth daily. 04/16/15   Arnoldo Morale, MD  Blood Glucose Monitoring Suppl (TRUE METRIX METER) DEVI 1 each by Does not apply route daily before breakfast. 04/16/15   Arnoldo Morale, MD  glucose blood (TRUE METRIX BLOOD GLUCOSE TEST) test strip Use daily before breakfast 04/16/15   Arnoldo Morale, MD  naproxen (NAPROSYN) 500 MG tablet Take 1 tablet (500 mg total) by mouth 2 (two) times daily with a meal. 03/28/15   Willeen Niece, MD  naproxen (NAPROSYN) 500 MG tablet Take 1 tablet (500 mg total) by mouth 3 (three) times daily with meals. 04/23/15   Konrad Felix, PA  predniSONE (DELTASONE) 20 MG tablet Take  1 tablet (20 mg total) by mouth daily with breakfast. 04/18/15   Arnoldo Morale, MD  predniSONE (DELTASONE) 20 MG tablet Take 2 tablets (40 mg total) by mouth daily. 04/23/15   Konrad Felix, PA  TRUEPLUS LANCETS 28G MISC 1 each by Does not apply route daily before breakfast. 04/16/15   Arnoldo Morale, MD   Meds Ordered and Administered this Visit  Medications - No data to display  BP 122/80 mmHg  Pulse 91  Temp(Src) 98.2 F (36.8 C) (Oral)  Resp 16  SpO2 97% No data found.   Physical Exam  Constitutional: He is oriented to person, place, and time. He appears well-developed and well-nourished.  HENT:  Head: Normocephalic and atraumatic.  Pulmonary/Chest: Effort normal.  Musculoskeletal: He exhibits tenderness.  Left great toe Swollen, warm, underlying redness. Tender to palpation  Neurological: He is alert and oriented to person, place, and time.  Skin: Skin is warm and dry.  Psychiatric: He has a normal mood and affect. His behavior is normal.  Nursing note and vitals reviewed.   ED Course  Procedures (including critical care time)  Labs Review Labs Reviewed - No data to display  Imaging Review No results found.   Visual Acuity Review  Right Eye Distance:   Left Eye Distance:   Bilateral Distance:    Right Eye Near:   Left Eye Near:    Bilateral Near:        RX prednisone, naproxen MDM   1. Acute gout of left foot, unspecified cause  Patient is reassured that there are no issues that require transfer to higher level of care at this time or additional tests. Patient is advised to continue home symptomatic treatment. Patient is advised that if there are new or worsening symptoms to attend the emergency department, contact primary care provider, or return to UC. Instructions of care provided discharged home in stable condition. Return to work/school note provided.   THIS NOTE WAS GENERATED USING A VOICE RECOGNITION SOFTWARE PROGRAM. ALL REASONABLE EFFORTS   WERE MADE TO PROOFREAD THIS DOCUMENT FOR ACCURACY.  I have verbally reviewed the discharge instructions with the patient. A printed AVS was given to the patient.  All questions were answered prior to discharge.      Konrad Felix, [place] 04/23/15 1452

## 2015-04-24 ENCOUNTER — Encounter: Payer: Self-pay | Admitting: Clinical

## 2015-04-24 ENCOUNTER — Ambulatory Visit: Payer: PRIVATE HEALTH INSURANCE | Attending: Family Medicine | Admitting: Pharmacist

## 2015-04-24 DIAGNOSIS — E119 Type 2 diabetes mellitus without complications: Secondary | ICD-10-CM | POA: Insufficient documentation

## 2015-04-24 NOTE — Progress Notes (Signed)
S:    Patient arrives in good mood today.  Presents for diabetes evaluation, education, and management. Patient also presents today in pain from recent gout flare.  Patient reports Diabetes was recently diagnosed in 2017. To date he has started no medications but has focused heavily on altering his diet to control this new disease state. Pt reports he is able to check BG at home.  Patient has experienced gout flares since 2015. He has been seen for multiple gout flare ups already this year.  Patient experienced gout flare up earlier this week and was seen by urgent care. He was prescribed prednisone, naproxen and colchicine at the time however did not fill the colchicine. He reports that the prednisone helps the most and that he is currently low on that prescription. He states pain is better since the flare but is still not completely resolved.  Patient reports adherence with medications.  Current diabetes medications include: None  Patient denies hypoglycemic events.  Patient reported dietary habits: Eats 2-3 meals/day. He typically eats smaller meals such as a single sandwich. He currently works two jobs at [employer] and has stopped eating their food since his diagnosis.  Patient reported exercise habits: He reports staying very active between his two full time jobs.   Patient denis nocturia.  Patient denies neuropathy. Patient denies visual changes. Patient denies self foot exams.    O:  Lab Results  Component Value Date   HGBA1C 6.6 04/16/2015   There were no vitals filed for this visit.  Per reports home CBGs are controlled  A/P: Diabetes newly diagnosed currently controlled based on A1c of 6.6. Patient denies hypoglycemic events and is able to verbalize appropriate hypoglycemia management plan. Patient has not started any anti-diabetic agents to date. Patient provided with diabetes education concerning diabetes, glucose meter education, diet, exercise, and  complications of diabetes.  Next A1C anticipated 07/17/15.    Gout: Pt was counseled on management of gout pain with use of appropriate agents. He was then instructed to fill prescriptions for prednisone and colchicine to aid in the treatment of acute gout pain.  Written patient instructions provided.  Total time in face to face counseling 30 minutes.   Follow up Clinic Visit with Dr. Jarold Song for reevaluation of gout treatment.   Patient seen with Jamie Brookes, PharmD Candidate

## 2015-04-24 NOTE — Patient Instructions (Signed)
Thanks for coming to see Terry Poole!  Start taking the colchicine - this will really help with the gout   Gout Gout is an inflammatory arthritis caused by a buildup of uric acid crystals in the joints. Uric acid is a chemical that is normally present in the blood. When the level of uric acid in the blood is too high it can form crystals that deposit in your joints and tissues. This causes joint redness, soreness, and swelling (inflammation). Repeat attacks are common. Over time, uric acid crystals can form into masses (tophi) near a joint, destroying bone and causing disfigurement. Gout is treatable and often preventable. CAUSES  The disease begins with elevated levels of uric acid in the blood. Uric acid is produced by your body when it breaks down a naturally found substance called purines. Certain foods you eat, such as meats and fish, contain high amounts of purines. Causes of an elevated uric acid level include:  Being passed down from parent to child (heredity).  Diseases that cause increased uric acid production (such as obesity, psoriasis, and certain cancers).  Excessive alcohol use.  Diet, especially diets rich in meat and seafood.  Medicines, including certain cancer-fighting medicines (chemotherapy), water pills (diuretics), and aspirin.  Chronic kidney disease. The kidneys are no longer able to remove uric acid well.  Problems with metabolism. Conditions strongly associated with gout include:  Obesity.  High blood pressure.  High cholesterol.  Diabetes. Not everyone with elevated uric acid levels gets gout. It is not understood why some people get gout and others do not. Surgery, joint injury, and eating too much of certain foods are some of the factors that can lead to gout attacks. SYMPTOMS   An attack of gout comes on quickly. It causes intense pain with redness, swelling, and warmth in a joint.  Fever can occur.  Often, only one joint is involved. Certain joints are  more commonly involved:  Base of the big toe.  Knee.  Ankle.  Wrist.  Finger. Without treatment, an attack usually goes away in a few days to weeks. Between attacks, you usually will not have symptoms, which is different from many other forms of arthritis. DIAGNOSIS  Your caregiver will suspect gout based on your symptoms and exam. In some cases, tests may be recommended. The tests may include:  Blood tests.  Urine tests.  X-rays.  Joint fluid exam. This exam requires a needle to remove fluid from the joint (arthrocentesis). Using a microscope, gout is confirmed when uric acid crystals are seen in the joint fluid. TREATMENT  There are two phases to gout treatment: treating the sudden onset (acute) attack and preventing attacks (prophylaxis).  Treatment of an Acute Attack.  Medicines are used. These include anti-inflammatory medicines or steroid medicines.  An injection of steroid medicine into the affected joint is sometimes necessary.  The painful joint is rested. Movement can worsen the arthritis.  You may use warm or cold treatments on painful joints, depending which works best for you.  Treatment to Prevent Attacks.  If you suffer from frequent gout attacks, your caregiver may advise preventive medicine. These medicines are started after the acute attack subsides. These medicines either help your kidneys eliminate uric acid from your body or decrease your uric acid production. You may need to stay on these medicines for a very long time.  The early phase of treatment with preventive medicine can be associated with an increase in acute gout attacks. For this reason, during the first few  months of treatment, your caregiver may also advise you to take medicines usually used for acute gout treatment. Be sure you understand your caregiver's directions. Your caregiver may make several adjustments to your medicine dose before these medicines are effective.  Discuss dietary  treatment with your caregiver or dietitian. Alcohol and drinks high in sugar and fructose and foods such as meat, poultry, and seafood can increase uric acid levels. Your caregiver or dietitian can advise you on drinks and foods that should be limited. HOME CARE INSTRUCTIONS   Do not take aspirin to relieve pain. This raises uric acid levels.  Only take over-the-counter or prescription medicines for pain, discomfort, or fever as directed by your caregiver.  Rest the joint as much as possible. When in bed, keep sheets and blankets off painful areas.  Keep the affected joint raised (elevated).  Apply warm or cold treatments to painful joints. Use of warm or cold treatments depends on which works best for you.  Use crutches if the painful joint is in your leg.  Drink enough fluids to keep your urine clear or pale yellow. This helps your body get rid of uric acid. Limit alcohol, sugary drinks, and fructose drinks.  Follow your dietary instructions. Pay careful attention to the amount of protein you eat. Your daily diet should emphasize fruits, vegetables, whole grains, and fat-free or low-fat milk products. Discuss the use of coffee, vitamin C, and cherries with your caregiver or dietitian. These may be helpful in lowering uric acid levels.  Maintain a healthy body weight. SEEK MEDICAL CARE IF:   You develop diarrhea, vomiting, or any side effects from medicines.  You do not feel better in 24 hours, or you are getting worse. SEEK IMMEDIATE MEDICAL CARE IF:   Your joint becomes suddenly more tender, and you have chills or a fever. MAKE SURE YOU:   Understand these instructions.  Will watch your condition.  Will get help right away if you are not doing well or get worse.   This information is not intended to replace advice given to you by your health care provider. Make sure you discuss any questions you have with your health care provider.   Document Released: 01/18/2000 Document  Revised: 02/10/2014 Document Reviewed: 09/03/2011 Elsevier Interactive Patient Education [employer].

## 2015-04-24 NOTE — Progress Notes (Signed)
Depression screen [facility] 2/9 04/16/2015 03/28/2015  Decreased Interest 0 0  Down, Depressed, Hopeless 0 0  PHQ - 2 Score 0 0    04-24-15: PHQ9- 8 (3,1,0,0,2,0,0,2,0)/ GAD7- 6 (0,2,0,0,2,0,2)

## 2015-04-30 MED FILL — predniSONE 20 MG TABS: 20 | 5 days supply | Qty: 10 | Fill #0

## 2015-05-01 MED FILL — NAPROXEN 500 MG TABLET: 500 | 10 days supply | Qty: 30 | Fill #0

## 2015-05-15 ENCOUNTER — Encounter: Payer: Self-pay | Admitting: Clinical

## 2015-05-15 ENCOUNTER — Encounter: Payer: Self-pay | Admitting: Family Medicine

## 2015-05-15 ENCOUNTER — Ambulatory Visit: Payer: PRIVATE HEALTH INSURANCE | Attending: Family Medicine | Admitting: Family Medicine

## 2015-05-15 VITALS — BP 146/92 | HR 87 | Temp 97.5°F | Resp 16 | Ht 69.0 in | Wt 227.4 lb

## 2015-05-15 DIAGNOSIS — Z79899 Other long term (current) drug therapy: Secondary | ICD-10-CM | POA: Insufficient documentation

## 2015-05-15 DIAGNOSIS — M109 Gout, unspecified: Secondary | ICD-10-CM | POA: Diagnosis present

## 2015-05-15 DIAGNOSIS — Z9114 Patient's other noncompliance with medication regimen: Secondary | ICD-10-CM | POA: Diagnosis not present

## 2015-05-15 DIAGNOSIS — M1000 Idiopathic gout, unspecified site: Secondary | ICD-10-CM | POA: Diagnosis not present

## 2015-05-15 MED ORDER — COLCHICINE 0.6 MG PO TABS: ORAL_TABLET | ORAL | Status: DC

## 2015-05-15 MED ORDER — PREDNISONE 20 MG PO TABS: 40.0000 mg | ORAL_TABLET | Freq: Every day | ORAL | Status: DC

## 2015-05-15 MED ORDER — ALLOPURINOL 300 MG PO TABS: 300.0000 mg | ORAL_TABLET | Freq: Every day | ORAL | Status: DC

## 2015-05-15 MED FILL — COLCHICINE 0.6 MG TABLET: 0.6 | 30 days supply | Qty: 30 | Fill #0

## 2015-05-15 MED FILL — ALLOPURINOL 300 MG TABLET: 300 | 30 days supply | Qty: 30 | Fill #0

## 2015-05-15 NOTE — Progress Notes (Signed)
Patient's here for f/up Gout. Patient c/o flare up to his left foot.  Patient requesting med refills:  Allopurinol,Prednisone,Colchincine.

## 2015-05-15 NOTE — Progress Notes (Signed)
Subjective:  Patient ID: Terry Poole, male    DOB: [birth year]  Age: 50 y.o. MRN: [id number]  CC: Follow-up and Gout   HPI Sutter Lombardozzi is a 50 year old male with a history of newly diagnosed type 2 diabetes mellitus (A1c 6.6) , Gout who comes into the clinic  Complaining of a gout attack in his left big toe.    At one of his previous visit I had prescribed allopurinol and colchicine which the patient does not  have with him but has been taking naproxen with no relief in symptoms.  he endorses swelling of the site and describes pain as 8/10.   He saw the pharmacist 2 weeks ago for diabetic teaching.  Outpatient Prescriptions Prior to Visit  Medication Sig Dispense Refill  . Blood Glucose Monitoring Suppl (TRUE METRIX METER) DEVI 1 each by Does not apply route daily before breakfast. 1 Device 0  . glucose blood (TRUE METRIX BLOOD GLUCOSE TEST) test strip Use daily before breakfast 30 each 5  . naproxen (NAPROSYN) 500 MG tablet Take 1 tablet (500 mg total) by mouth 3 (three) times daily with meals. 30 tablet 0  . TRUEPLUS LANCETS 28G MISC 1 each by Does not apply route daily before breakfast. 30 each 5  . allopurinol (ZYLOPRIM) 300 MG tablet Take 1 tablet (300 mg total) by mouth daily. 30 tablet 2  . colchicine 0.6 MG tablet Take 2 tabs (1.2mg ) at the onset of a gout attack, may repeat 1 tab (0.6mg ) after 1 hour if symptoms persist. 30 tablet 2  . predniSONE (DELTASONE) 20 MG tablet Take 2 tablets (40 mg total) by mouth daily. 10 tablet 0   No facility-administered medications prior to visit.    ROS Review of Systems  Constitutional: Negative for activity change and appetite change.  HENT: Negative for sinus pressure and sore throat.   Respiratory: Negative for chest tightness, shortness of breath and wheezing.   Cardiovascular: Negative for chest pain and palpitations.  Gastrointestinal: Negative for abdominal pain, constipation and abdominal distention.  Genitourinary: Negative.     Musculoskeletal:       See hpi  Psychiatric/Behavioral: Negative for behavioral problems and dysphoric mood.    Objective:  BP 146/92 mmHg  Pulse 87  Temp(Src) 97.5 F (36.4 C) (Oral)  Resp 16  Ht 5\' 9"  (1.753 m)  Wt 227 lb 6.4 oz (103.148 kg)  BMI 33.57 kg/m2  SpO2 98%  BP/Weight 05/15/2015 04/23/2015 0000000  Systolic BP 123456 [id number] 123456  Diastolic BP 92 80 92  Wt. (Lbs) 227.4 - 232.8  BMI 33.57 - 34.36      Physical Exam  Constitutional: He is oriented to person, place, and time. He appears well-developed and well-nourished.  Cardiovascular: Normal rate, normal heart sounds and intact distal pulses.   No murmur heard. Pulmonary/Chest: Effort normal and breath sounds normal. He has no wheezes. He has no rales. He exhibits no tenderness.  Abdominal: Soft. Bowel sounds are normal. He exhibits no distension and no mass. There is no tenderness.  Musculoskeletal: He exhibits edema (edema of left big toe) and tenderness (tenderness of left big toe).  Neurological: He is alert and oriented to person, place, and time.     Assessment & Plan:   1. Acute idiopathic gout, unspecified site Acute gout flare of gout left big toe due to noncompliance with medications. Discussed abstinence from foods thatTrigger gout. I have completed [facility] paperwork for the patient given he was out of work from 2/30/17 through 3 /  14/17 for recurrent gout flares. - predniSONE (DELTASONE) 20 MG tablet; Take 2 tablets (40 mg total) by mouth daily.  Dispense: 10 tablet; Refill: 0 - allopurinol (ZYLOPRIM) 300 MG tablet; Take 1 tablet (300 mg total) by mouth daily.  Dispense: 30 tablet; Refill: 2 - colchicine 0.6 MG tablet; Take 2 tabs (1.2mg ) at the onset of a gout attack, may repeat 1 tab (0.6mg ) after 1 hour if symptoms persist.  Dispense: 30 tablet; Refill: 2   Meds ordered this encounter  Medications  . predniSONE (DELTASONE) 20 MG tablet    Sig: Take 2 tablets (40 mg total) by mouth daily.     Dispense:  10 tablet    Refill:  0  . allopurinol (ZYLOPRIM) 300 MG tablet    Sig: Take 1 tablet (300 mg total) by mouth daily.    Dispense:  30 tablet    Refill:  2  . colchicine 0.6 MG tablet    Sig: Take 2 tabs (1.2mg ) at the onset of a gout attack, may repeat 1 tab (0.6mg ) after 1 hour if symptoms persist.    Dispense:  30 tablet    Refill:  2    Follow-up: Return in about 3 months (around 08/14/2015) for Follow up on Diabetes mellitus.   Arnoldo Morale MD

## 2015-05-15 NOTE — Progress Notes (Signed)
Depression screen [facility] 2/9 05/15/2015 05/15/2015 04/16/2015 03/28/2015  Decreased Interest 2 0 0 0  Down, Depressed, Hopeless 0 0 0 0  PHQ - 2 Score 2 0 0 0  Altered sleeping 0 - - -  Tired, decreased energy 3 - - -  Change in appetite 0 - - -  Feeling bad or failure about yourself  0 - - -  Trouble concentrating 0 - - -  Moving slowly or fidgety/restless 3 - - -  Suicidal thoughts 0 - - -  PHQ-9 Score 8 - - -    GAD 7 : Generalized Anxiety Score 05/15/2015  Nervous, Anxious, on Edge 2  Control/stop worrying 3  Worry too much - different things 3  Trouble relaxing 0  Restless 0  Easily annoyed or irritable 3  Afraid - awful might happen 0  Total GAD 7 Score 11

## 2015-05-15 NOTE — Patient Instructions (Signed)

## 2015-05-16 MED FILL — predniSONE 20 MG TABS: 20 | 5 days supply | Qty: 10 | Fill #0

## 2015-05-24 ENCOUNTER — Other Ambulatory Visit: Payer: Self-pay | Admitting: Family Medicine

## 2015-05-25 MED FILL — predniSONE 20 MG TABS: 20 | 5 days supply | Qty: 5 | Fill #0

## 2015-05-28 ENCOUNTER — Other Ambulatory Visit: Payer: Self-pay | Admitting: Family Medicine

## 2015-05-28 DIAGNOSIS — M1000 Idiopathic gout, unspecified site: Secondary | ICD-10-CM

## 2015-05-28 MED FILL — COLCHICINE 0.6 MG TABLET: 0.6 | 15 days supply | Qty: 30 | Fill #1

## 2015-05-28 NOTE — Telephone Encounter (Signed)
Patient called requesting medication change predniSONE (DELTASONE) 20 MG tablet to something stronger, patient states medication is not helping with feet pain. Please f/u

## 2015-05-30 NOTE — Telephone Encounter (Signed)
Additional phone call placed to patient; unable to reach patient and unable to leave voicemail as voicemail box not set-up.

## 2015-05-30 NOTE — Telephone Encounter (Signed)
Patient returned nurse phone call. °Please follow up. °

## 2015-05-30 NOTE — Telephone Encounter (Signed)
This Case Manager placed call to patient to determine if he was taking colchicine. Call placed to [phone]; however, unable to reach patient. Unable to leave voicemail as voicemail box not set-up. Will route to Norma Fredrickson, CMA for Dr. Jarold Song for additional follow-up.

## 2015-06-05 MED ORDER — PREDNISONE 20 MG PO TABS: 40.0000 mg | ORAL_TABLET | Freq: Every day | ORAL | Status: DC

## 2015-06-05 MED FILL — predniSONE 20 MG TABS: 20 | 5 days supply | Qty: 10 | Fill #0

## 2015-06-05 NOTE — Telephone Encounter (Signed)
Patient states he is in pain and would like Prednisone prescribed....... Please follow up with patient

## 2015-06-05 NOTE — Telephone Encounter (Signed)
Prednisone sent to pharmacy

## 2015-06-06 MED ORDER — NAPROXEN 500 MG PO TABS: 500.0000 mg | ORAL_TABLET | Freq: Three times a day (TID) | ORAL | Status: DC

## 2015-06-06 MED FILL — NAPROXEN 500 MG TABLET: 500 | 10 days supply | Qty: 30 | Fill #0

## 2015-06-06 NOTE — Telephone Encounter (Signed)
Spoke with patient, patient verified name and DOB. Patient stated that he called asking for refill of naproxen, because the prednisone is not working. Patient asked about refill of ibuprofen as well. I explained to the patient that I would have to send over a request for the naproxen to the provider and the ibuprofen, he can get OTC.

## 2015-06-06 NOTE — Telephone Encounter (Signed)
I have sent a prescription for naproxen to the pharmacy however he should not be taking ibuprofen and naproxen at the same time as both medications are NSAIDS. He does have colchicine which I previously sent to the pharmacy for acute gout flare and allopurinol for prevention of gout flares please ensure he has all this medications.

## 2015-06-07 NOTE — Telephone Encounter (Signed)
Placed call to patient, patient verified name and DOB. Patient was instructed that provider advise him to not take the naproxen along with the ibuprofen. Patient verbalize understanding. I was informed that he hasn't picked up his other meds allopurinol, or the colchicine yet. It will be Tuesday before he can get by the pharmacy.

## 2015-06-12 MED FILL — ALLOPURINOL 300 MG TABLET: 300 | 30 days supply | Qty: 30 | Fill #1

## 2015-06-25 ENCOUNTER — Telehealth: Payer: Self-pay | Admitting: Family Medicine

## 2015-06-25 NOTE — Telephone Encounter (Signed)
Patient would like to speak to nurse and see if he can get a prescription to assist with tightness and soreness on the back of legs.....  He has been experiencing pain for the past three weeks.   Patient is requesting a refill Naproxen, Prednisone....  Please follow up with patient

## 2015-07-03 ENCOUNTER — Other Ambulatory Visit: Payer: Self-pay | Admitting: Family Medicine

## 2015-07-03 NOTE — Telephone Encounter (Signed)
He received a prescription for prednisone early in the month and will need an office visit to ascertain compliance with prophylactic meds due to frequent Gout flares

## 2015-07-09 ENCOUNTER — Encounter (HOSPITAL_COMMUNITY): Payer: Self-pay | Admitting: Emergency Medicine

## 2015-07-09 ENCOUNTER — Emergency Department (HOSPITAL_COMMUNITY)
Admission: EM | Admit: 2015-07-09 | Discharge: 2015-07-09 | Disposition: A | Discharge status: Home / Self Care | Payer: PRIVATE HEALTH INSURANCE | Attending: Emergency Medicine | Admitting: Emergency Medicine

## 2015-07-09 ENCOUNTER — Other Ambulatory Visit: Payer: Self-pay | Admitting: Family Medicine

## 2015-07-09 ENCOUNTER — Telehealth: Payer: Self-pay | Admitting: Family Medicine

## 2015-07-09 DIAGNOSIS — Z7952 Long term (current) use of systemic steroids: Secondary | ICD-10-CM | POA: Insufficient documentation

## 2015-07-09 DIAGNOSIS — S29019A Strain of muscle and tendon of unspecified wall of thorax, initial encounter: Secondary | ICD-10-CM

## 2015-07-09 DIAGNOSIS — Y9289 Other specified places as the place of occurrence of the external cause: Secondary | ICD-10-CM | POA: Insufficient documentation

## 2015-07-09 DIAGNOSIS — X58XXXA Exposure to other specified factors, initial encounter: Secondary | ICD-10-CM | POA: Diagnosis not present

## 2015-07-09 DIAGNOSIS — Z8709 Personal history of other diseases of the respiratory system: Secondary | ICD-10-CM | POA: Diagnosis not present

## 2015-07-09 DIAGNOSIS — Y9389 Activity, other specified: Secondary | ICD-10-CM | POA: Insufficient documentation

## 2015-07-09 DIAGNOSIS — Y998 Other external cause status: Secondary | ICD-10-CM | POA: Insufficient documentation

## 2015-07-09 DIAGNOSIS — M109 Gout, unspecified: Secondary | ICD-10-CM | POA: Diagnosis not present

## 2015-07-09 DIAGNOSIS — Z79899 Other long term (current) drug therapy: Secondary | ICD-10-CM | POA: Insufficient documentation

## 2015-07-09 DIAGNOSIS — S29002A Unspecified injury of muscle and tendon of back wall of thorax, initial encounter: Secondary | ICD-10-CM | POA: Diagnosis present

## 2015-07-09 DIAGNOSIS — Z791 Long term (current) use of non-steroidal anti-inflammatories (NSAID): Secondary | ICD-10-CM | POA: Diagnosis not present

## 2015-07-09 DIAGNOSIS — Z87891 Personal history of nicotine dependence: Secondary | ICD-10-CM | POA: Diagnosis not present

## 2015-07-09 DIAGNOSIS — S29012A Strain of muscle and tendon of back wall of thorax, initial encounter: Secondary | ICD-10-CM | POA: Diagnosis not present

## 2015-07-09 LAB — URINALYSIS, ROUTINE W REFLEX MICROSCOPIC
Bilirubin Urine: NEGATIVE
GLUCOSE, UA: NEGATIVE mg/dL
HGB URINE DIPSTICK: NEGATIVE
KETONES UR: 15 mg/dL — AB
Leukocytes, UA: NEGATIVE
Nitrite: NEGATIVE
PH: 7 (ref 5.0–8.0)
PROTEIN: NEGATIVE mg/dL
Specific Gravity, Urine: 1.024 (ref 1.005–1.030)

## 2015-07-09 MED ORDER — CYCLOBENZAPRINE HCL 10 MG PO TABS: 10.0000 mg | ORAL_TABLET | Freq: Three times a day (TID) | ORAL | Status: DC | PRN

## 2015-07-09 MED ORDER — KETOROLAC TROMETHAMINE 30 MG/ML IJ SOLN
30.0000 mg | Freq: Once | INTRAMUSCULAR | Status: AC
  Administered 2015-07-09: 30 mg via INTRAVENOUS
  Filled 2015-07-09: qty 1

## 2015-07-09 MED ORDER — ONDANSETRON HCL 4 MG/2ML IJ SOLN
4.0000 mg | Freq: Once | INTRAMUSCULAR | Status: AC
  Administered 2015-07-09: 4 mg via INTRAVENOUS
  Filled 2015-07-09: qty 2

## 2015-07-09 MED ORDER — HYDROMORPHONE HCL 1 MG/ML IJ SOLN
1.0000 mg | Freq: Once | INTRAMUSCULAR | Status: AC
  Administered 2015-07-09: 1 mg via INTRAVENOUS
  Filled 2015-07-09: qty 1

## 2015-07-09 MED FILL — NAPROXEN 500 MG TABLET: 500 | 10 days supply | Qty: 30 | Fill #0

## 2015-07-09 NOTE — Telephone Encounter (Signed)
Patient came into office requesting medication refill, pt was seen at ER and thought he was prescribed cyclobenzaprine (FLEXERIL) 10 MG tablet but not available at pharmacy. Please f/up

## 2015-07-09 NOTE — ED Provider Notes (Signed)
CSN: [id number]     Arrival date & time    History  By signing my name below, I, Altamease Oiler, attest that this documentation has been prepared under the direction and in the presence of Orpah Greek, MD. Electronically Signed: Altamease Oiler, ED Scribe. 07/09/2015. 3:17 AM   Chief Complaint  Patient presents with  . Back Pain   The history is provided by the patient and the EMS personnel. No language interpreter was used.   Brought in by EMS from home, Terry Poole is a 50 y.o. male who presents to the Emergency Department complaining of new, worsening, stabbing, right mid back pain with sudden onset last week. The pt denies any injury to the back. The pain does not radiate down the legs. He took nothing for his symptoms noting that he was seen by his PCP for the pain last week and told that medication would be called in for him but it was not. The pain is worse with movement and palpation. Pt denies hematuria, difficulty urinating, numbness, tingling, and weakness.   Past Medical History  Diagnosis Date  . Bronchitis   . Bronchitis   . Gout    History reviewed. No pertinent past surgical history. No family history on file. Social History  Substance Use Topics  . Smoking status: Former Smoker -- 0.00 packs/day    Types: Cigarettes  . Smokeless tobacco: Never Used  . Alcohol Use: No    Review of Systems  Genitourinary: Negative for difficulty urinating.  Musculoskeletal: Positive for back pain.  Neurological: Negative for weakness and numbness.  All other systems reviewed and are negative.  Allergies  Tramadol and Vicodin  Home Medications   Prior to Admission medications   Medication Sig Start Date End Date Taking? Authorizing Provider  allopurinol (ZYLOPRIM) 300 MG tablet Take 1 tablet (300 mg total) by mouth daily. 05/15/15   Arnoldo Morale, MD  Blood Glucose Monitoring Suppl (TRUE METRIX METER) DEVI 1 each by Does not apply route daily before breakfast.  04/16/15   Arnoldo Morale, MD  colchicine 0.6 MG tablet Take 2 tabs (1.2mg ) at the onset of a gout attack, may repeat 1 tab (0.6mg ) after 1 hour if symptoms persist. 05/15/15   Arnoldo Morale, MD  glucose blood (TRUE METRIX BLOOD GLUCOSE TEST) test strip Use daily before breakfast 04/16/15   Arnoldo Morale, MD  naproxen (NAPROSYN) 500 MG tablet TAKE 1 TABLET BY MOUTH 3 TIMES DAILY WITH MEALS. 07/03/15   Arnoldo Morale, MD  predniSONE (DELTASONE) 20 MG tablet Take 2 tablets (40 mg total) by mouth daily. 06/05/15   Arnoldo Morale, MD  TRUEPLUS LANCETS 28G MISC 1 each by Does not apply route daily before breakfast. 04/16/15   Arnoldo Morale, MD   BP 142/84 mmHg  Pulse 82  Temp(Src) 97.7 F (36.5 C) (Oral)  Resp 18  SpO2 99% Physical Exam  Constitutional: He is oriented to person, place, and time. He appears well-developed and well-nourished. No distress.  HENT:  Head: Normocephalic and atraumatic.  Right Ear: Hearing normal.  Left Ear: Hearing normal.  Nose: Nose normal.  Mouth/Throat: Oropharynx is clear and moist and mucous membranes are normal.  Eyes: Conjunctivae and EOM are normal. Pupils are equal, round, and reactive to light.  Neck: Normal range of motion. Neck supple.  Cardiovascular: Regular rhythm, S1 normal and S2 normal.  Exam reveals no gallop and no friction rub.   No murmur heard. Pulmonary/Chest: Effort normal and breath sounds normal. No respiratory distress. He  exhibits no tenderness.  Abdominal: Soft. Normal appearance and bowel sounds are normal. There is no hepatosplenomegaly. There is no tenderness. There is no rebound, no guarding, no tenderness at McBurney's point and negative Murphy's sign. No hernia.  Musculoskeletal: Normal range of motion.  Right lower thoracic paraspinal spasm and tenderness  Neurological: He is alert and oriented to person, place, and time. He has normal strength. No cranial nerve deficit or sensory deficit. Coordination normal. GCS eye subscore is 4. GCS verbal  subscore is 5. GCS motor subscore is 6.  Skin: Skin is warm, dry and intact. No rash noted. No cyanosis.  Psychiatric: He has a normal mood and affect. His speech is normal and behavior is normal. Thought content normal.  Nursing note and vitals reviewed.   ED Course  Procedures (including critical care time) DIAGNOSTIC STUDIES: Oxygen Saturation is 99% on RA,  normal by my interpretation.    COORDINATION OF CARE: 3:12 AM Discussed treatment plan which includes Dilaudid and Zofran with pt at bedside and pt agreed to plan.  Labs Review Labs Reviewed  URINALYSIS, ROUTINE W REFLEX MICROSCOPIC (NOT AT [facility]) - Abnormal; Notable for the following:    Ketones, ur 15 (*)    All other components within normal limits    Imaging Review No results found.   EKG Interpretation None       MDM   Final diagnoses:  Thoracic myofascial strain, initial encounter    Patient presents to the emergency part for evaluation of right-sided back pain. Patient reports pain has been present for 1 week and has progressively worsened. He denies any direct injury. Examination was consistent with musculoskeletal pain. He has diffuse tenderness in the right paraspinal thoracic region with spasm. Patient has had significant improvement with analgesia. Urinalysis does not suggest infection or kidney stone. He will be discharged with treatment for musculoskeletal back pain.  I personally performed the services described in this documentation, which was scribed in my presence. The recorded information has been reviewed and is accurate.    Orpah Greek, MD 07/09/15 [id number]

## 2015-07-09 NOTE — Discharge Instructions (Signed)
Thoracic Strain A thoracic strain, which is sometimes called a mid-back strain, is an injury to the muscles or tendons that attach to the upper part of your back behind your chest. This type of injury occurs when a muscle is overstretched or overloaded.  Thoracic strains can range from mild to severe. Mild strains may involve stretching a muscle or tendon without tearing it. These injuries may heal in 1-2 weeks. More severe strains involve tearing of muscle fibers or tendons. These will cause more pain and may take 6-8 weeks to heal. CAUSES This condition may be caused by:  An injury in which a sudden force is placed on the muscle.  Exercising without properly warming up.  Overuse of the muscle.  Improper form during certain movements.  Other injuries that surround or cause stress on the mid-back, causing a strain on the muscles. In some cases, the cause may not be known. RISK FACTORS This injury is more common in:  Athletes.  People with obesity. SYMPTOMS The main symptom of this condition is pain, especially with movement. Other symptoms include:  Bruising.  Swelling.  Spasm. DIAGNOSIS This condition may be diagnosed with a physical exam. X-rays may be taken to check for a fracture. TREATMENT This condition may be treated with:  Resting and icing the injured area.  Physical therapy. This will involve doing stretching and strengthening exercises.  Medicines for pain and inflammation. HOME CARE INSTRUCTIONS  Rest as needed. Follow instructions from your health care provider about any restrictions on activity.  If directed, apply ice to the injured area:  Put ice in a plastic bag.  Place a towel between your skin and the bag.  Leave the ice on for 20 minutes, 2-3 times per day.  Take over-the-counter and prescription medicines only as told by your health care provider.  Begin doing exercises as told by your health care provider or physical therapist.  Always  warm up properly before physical activity or sports.  Bend your knees before you lift heavy objects.  Keep all follow-up visits as told by your health care provider. This is important. SEEK MEDICAL CARE IF:  Your pain is not helped by medicine.  Your pain, bruising, or swelling is getting worse.  You have a fever. SEEK IMMEDIATE MEDICAL CARE IF:  You have shortness of breath.  You have chest pain.  You develop numbness or weakness in your legs.  You have involuntary loss of urine (urinary incontinence).   This information is not intended to replace advice given to you by your health care provider. Make sure you discuss any questions you have with your health care provider.   Document Released: 04/12/2003 Document Revised: 10/11/2014 Document Reviewed: 03/16/2014 Elsevier Interactive Patient Education 2016 [employer]  

## 2015-07-09 NOTE — ED Notes (Addendum)
Pt presents from home via [facility] with c/o R sided low back pain x a few weeks. Pt had Rx for muscle relaxer, but has been unable to get prescription filled so far. Pain begins above R post hip, does not shoot down leg per pt. Pain 9/10.

## 2015-07-11 ENCOUNTER — Other Ambulatory Visit: Payer: Self-pay | Admitting: Internal Medicine

## 2015-07-11 ENCOUNTER — Telehealth: Payer: Self-pay | Admitting: Surgery

## 2015-07-11 MED ORDER — CYCLOBENZAPRINE HCL 10 MG PO TABS: 10.0000 mg | ORAL_TABLET | Freq: Three times a day (TID) | ORAL | Status: DC | PRN

## 2015-07-11 MED FILL — CYCLOBENZAPRINE 10 MG TAB: 10 | 10 days supply | Qty: 30 | Fill #0

## 2015-07-11 NOTE — Telephone Encounter (Addendum)
Writer spoke to patient regarding this situation with the ER on 07/09/15.  Patient states he never got a prescription for flexeril because the MD told him that it was sent to the pharmacy.  Patient called [facility] and was told the prescription was not here.  Patient is calling wondering what can be done.  This RN called the ED and was transferred to Case Manager Laurena Slimmer who was in a meeting.  Writer was asked to email her and give her the information needed, which was completed. Writer spoke with Dr. 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 Patient is aware and very grateful.  He will be here in the am to pick up his medication and work excuse note.

## 2015-07-11 NOTE — Telephone Encounter (Signed)
ED CM received call from [facility] concerning patient not having a paper script in the ED last night for Flexeril 10mg  PO. CM reviewed record and verified prescription with [facility].  No further CM needs identified.

## 2015-07-30 ENCOUNTER — Other Ambulatory Visit: Payer: Self-pay | Admitting: Internal Medicine

## 2015-07-30 ENCOUNTER — Other Ambulatory Visit: Payer: Self-pay | Admitting: Family Medicine

## 2015-07-30 MED FILL — ALLOPURINOL 300 MG TABLET: 300 | 30 days supply | Qty: 30 | Fill #2

## 2015-07-30 MED FILL — COLCHICINE 0.6 MG TABLET: 0.6 | 15 days supply | Qty: 30 | Fill #2

## 2015-07-31 MED FILL — NAPROXEN 500 MG TABLET: 500 | 15 days supply | Qty: 30 | Fill #0

## 2015-07-31 MED FILL — CYCLOBENZAPRINE 10 MG TAB: 10 | 10 days supply | Qty: 30 | Fill #0

## 2015-07-31 MED FILL — predniSONE 20 MG TABS: 20 | 5 days supply | Qty: 10 | Fill #0

## 2015-08-29 ENCOUNTER — Other Ambulatory Visit: Payer: Self-pay | Admitting: Family Medicine

## 2015-08-29 DIAGNOSIS — M1000 Idiopathic gout, unspecified site: Secondary | ICD-10-CM

## 2015-08-29 MED ORDER — ALLOPURINOL 300 MG PO TABS: 300.0000 mg | ORAL_TABLET | Freq: Every day | ORAL | 0 refills | Status: DC

## 2015-08-29 MED ORDER — CYCLOBENZAPRINE HCL 10 MG PO TABS: 10.0000 mg | ORAL_TABLET | Freq: Three times a day (TID) | ORAL | 0 refills | Status: DC | PRN

## 2015-08-29 MED ORDER — COLCHICINE 0.6 MG PO TABS: ORAL_TABLET | ORAL | 0 refills | Status: DC

## 2015-08-29 MED FILL — ?CYCLOBENZAPRINE 10 MG TABL: 10 | 10 days supply | Qty: 30 | Fill #0

## 2015-08-29 MED FILL — ALLOPURINOL 300 MG TABLET: 300 | 30 days supply | Qty: 30 | Fill #0

## 2015-08-29 MED FILL — COLCHICINE 0.6 MG TABLET: 0.6 | 10 days supply | Qty: 30 | Fill #0

## 2015-08-29 NOTE — Telephone Encounter (Signed)
Refilled allopurinol, cyclobenzaprine, and colchicine - will forward request for prednisone to Dr. Jarold Song

## 2015-08-29 NOTE — Telephone Encounter (Signed)
Pt. Called requesting a refill on the following medications:  allopurinol (ZYLOPRIM) 300 MG tablet  predniSONE (DELTASONE) 20 MG tablet  cyclobenzaprine (FLEXERIL) 10 MG tablet colchicine 0.6 MG tablet     Please f/u with pt.

## 2015-08-30 NOTE — Telephone Encounter (Signed)
He has called several times requesting a refill on prednisone. Refill denied; he is due for an office visit.

## 2015-08-31 ENCOUNTER — Telehealth: Payer: Self-pay

## 2015-08-31 NOTE — Telephone Encounter (Signed)
Writer returned patient's call to let him know that he needs to see Dr. Jarold Song.  Prednisone refill denied.  Writer unable to LVM.

## 2015-09-04 NOTE — Telephone Encounter (Signed)
Clld pt   Unable to leave voice message, per recording the person has a voicemail that has not been set up yet. Please try your call again at another time.  If patient should call again regarding refill of the prednisone, please advise he will need to schedule an office visit before any more refills will be approved.

## 2015-09-21 ENCOUNTER — Ambulatory Visit: Payer: PRIVATE HEALTH INSURANCE | Attending: Family Medicine | Admitting: Family Medicine

## 2015-09-21 ENCOUNTER — Encounter: Payer: Self-pay | Admitting: Family Medicine

## 2015-09-21 VITALS — BP 139/81 | HR 97 | Temp 98.0°F | Ht 69.0 in | Wt 231.8 lb

## 2015-09-21 DIAGNOSIS — Z888 Allergy status to other drugs, medicaments and biological substances status: Secondary | ICD-10-CM | POA: Diagnosis not present

## 2015-09-21 DIAGNOSIS — M1000 Idiopathic gout, unspecified site: Secondary | ICD-10-CM | POA: Insufficient documentation

## 2015-09-21 DIAGNOSIS — M62838 Other muscle spasm: Secondary | ICD-10-CM | POA: Diagnosis not present

## 2015-09-21 DIAGNOSIS — Z79899 Other long term (current) drug therapy: Secondary | ICD-10-CM | POA: Insufficient documentation

## 2015-09-21 DIAGNOSIS — L84 Corns and callosities: Secondary | ICD-10-CM | POA: Insufficient documentation

## 2015-09-21 DIAGNOSIS — M25561 Pain in right knee: Secondary | ICD-10-CM | POA: Diagnosis not present

## 2015-09-21 DIAGNOSIS — E1169 Type 2 diabetes mellitus with other specified complication: Secondary | ICD-10-CM | POA: Insufficient documentation

## 2015-09-21 LAB — POCT GLYCOSYLATED HEMOGLOBIN (HGB A1C): Hemoglobin A1C: 6.5

## 2015-09-21 LAB — COMPLETE METABOLIC PANEL WITH GFR
ALBUMIN: 4.2 g/dL (ref 3.6–5.1)
ALK PHOS: 128 U/L — AB (ref 40–115)
ALT: 44 U/L (ref 9–46)
AST: 22 U/L (ref 10–35)
BILIRUBIN TOTAL: 0.4 mg/dL (ref 0.2–1.2)
BUN: 9 mg/dL (ref 7–25)
CO2: 25 mmol/L (ref 20–31)
CREATININE: 0.94 mg/dL (ref 0.70–1.33)
Calcium: 9.3 mg/dL (ref 8.6–10.3)
Chloride: 105 mmol/L (ref 98–110)
GFR, Est African American: >89 mL/min (ref >=60)
GLUCOSE: 149 mg/dL — AB (ref 65–99)
Potassium: 3.9 mmol/L (ref 3.5–5.3)
SODIUM: 141 mmol/L (ref 135–146)
TOTAL PROTEIN: 7 g/dL (ref 6.1–8.1)

## 2015-09-21 LAB — LIPID PANEL
Cholesterol: 157 mg/dL (ref 125–200)
HDL: 60 mg/dL (ref >=40)
LDL Cholesterol: 50 mg/dL (ref <130)
Total CHOL/HDL Ratio: 2.6 Ratio (ref <=5.0)
Triglycerides: 233 mg/dL — ABNORMAL HIGH (ref <150)
VLDL: 47 mg/dL — ABNORMAL HIGH (ref <30)

## 2015-09-21 LAB — URIC ACID: URIC ACID, SERUM: 8.7 mg/dL — AB (ref 4.0–8.0)

## 2015-09-21 LAB — GLUCOSE, POCT (MANUAL RESULT ENTRY): POC Glucose: 168 mg/dl — AB (ref 70–99)

## 2015-09-21 MED ORDER — NAPROXEN 500 MG PO TABS: 500.0000 mg | ORAL_TABLET | Freq: Two times a day (BID) | ORAL | 2 refills | Status: DC

## 2015-09-21 MED ORDER — CYCLOBENZAPRINE HCL 10 MG PO TABS: 10.0000 mg | ORAL_TABLET | Freq: Three times a day (TID) | ORAL | 0 refills | Status: DC | PRN

## 2015-09-21 MED ORDER — ALLOPURINOL 300 MG PO TABS: 300.0000 mg | ORAL_TABLET | Freq: Two times a day (BID) | ORAL | 3 refills | Status: DC

## 2015-09-21 MED ORDER — COLCHICINE 0.6 MG PO TABS: ORAL_TABLET | ORAL | 0 refills | Status: DC

## 2015-09-21 MED FILL — ?ALLOPURINOL 300 MG TABLETS: 300 | 30 days supply | Qty: 60 | Fill #0

## 2015-09-21 MED FILL — ?CYCLOBENZAPRINE 10 MG TABL: 10 | 10 days supply | Qty: 30 | Fill #0

## 2015-09-21 MED FILL — COLCHICINE 0.6 MG TABLET: 0.6 | 10 days supply | Qty: 30 | Fill #0

## 2015-09-21 MED FILL — NAPROXEN 500 MG TABLET: 500 | 15 days supply | Qty: 30 | Fill #0

## 2015-09-21 NOTE — Progress Notes (Signed)
Medication refills Swollen right knee for one month

## 2015-09-21 NOTE — Progress Notes (Signed)
Subjective:  Patient ID: Terry Poole, male    DOB: 06-Jul-1965  Age: 50 y.o. MRN: [id number]  CC: Diabetes; Knee Pain (right knee); and Gout   HPI Terry Poole is a 50 year old male with a history of type 2 diabetes mellitus (diet controlled with A1c of 6.5), gout who comes into the clinic for a follow-up visit.  Over the last few months he has had several gout flares necessitating his calling the clinic to request refill on prednisone. Today he complains of pain in his right hamstrings and "something moving" in his right knee when he moves his knee but denies frank pain in the knee. Patient has been on for the last 1 month and hamstring pain radiates to his hip.  He remains on dietary control of his diabetes and denies hypoglycemia or numbness next remedy. Denies blurry vision and has not been to see an ophthalmologist for an eye exam.  He does have a corn in the toe of his right foot which hurts when he rubs his toes against each other.  Past Medical History:  Diagnosis Date  . Bronchitis   . Bronchitis   . Gout     History reviewed. No pertinent surgical history.  Allergies  Allergen Reactions  . Tramadol Hives  . Vicodin [Hydrocodone-Acetaminophen] Hives    Outpatient Medications Prior to Visit  Medication Sig Dispense Refill  . allopurinol (ZYLOPRIM) 300 MG tablet Take 1 tablet (300 mg total) by mouth daily. 30 tablet 0  . colchicine 0.6 MG tablet Take 2 tabs (1.2mg ) at the onset of a gout attack, may repeat 1 tab (0.6mg ) after 1 hour if symptoms persist. 30 tablet 0  . cyclobenzaprine (FLEXERIL) 10 MG tablet Take 1 tablet (10 mg total) by mouth 3 (three) times daily as needed for muscle spasms. 30 tablet 0  . naproxen (NAPROSYN) 500 MG tablet Take 1 tablet (500 mg total) by mouth 2 (two) times daily with a meal. 30 tablet 0  . Blood Glucose Monitoring Suppl (TRUE METRIX METER) DEVI 1 each by Does not apply route daily before breakfast. (Patient not taking: Reported on  09/21/2015) 1 Device 0  . glucose blood (TRUE METRIX BLOOD GLUCOSE TEST) test strip Use daily before breakfast (Patient not taking: Reported on 09/21/2015) 30 each 5  . TRUEPLUS LANCETS 28G MISC 1 each by Does not apply route daily before breakfast. (Patient not taking: Reported on 09/21/2015) 30 each 5  . predniSONE (DELTASONE) 20 MG tablet TAKE 2 TABLETS BY MOUTH DAILY. (Patient not taking: Reported on 09/21/2015) 10 tablet 0   No facility-administered medications prior to visit.     ROS Review of Systems  Constitutional: Negative for activity change and appetite change.  HENT: Negative for sinus pressure and sore throat.   Eyes: Negative for visual disturbance.  Respiratory: Negative for cough, chest tightness and shortness of breath.   Cardiovascular: Negative for chest pain and leg swelling.  Gastrointestinal: Negative for abdominal distention, abdominal pain, constipation and diarrhea.  Endocrine: Negative.   Genitourinary: Negative for dysuria.  Musculoskeletal:       See hpi  Skin: Negative for rash.  Allergic/Immunologic: Negative.   Neurological: Negative for weakness, light-headedness and numbness.  Psychiatric/Behavioral: Negative for dysphoric mood and suicidal ideas.    Objective:  BP 139/81 (BP Location: Right Arm, Patient Position: Sitting, Cuff Size: Large)   Pulse 97   Temp 98 F (36.7 C) (Oral)   Ht 5\' 9"  (1.753 m)   Wt 231 lb 12.8 oz (  105.1 kg)   SpO2 97%   BMI 34.23 kg/m   BP/Weight 09/21/2015 07/09/2015 [id number]  Systolic BP [id number]  Diastolic BP 81 87 92  Wt. (Lbs) 231.8 - 227.4  BMI 34.23 - 33.57      Physical Exam  Constitutional: He is oriented to person, place, and time. He appears well-developed and well-nourished.  Cardiovascular: Normal rate, normal heart sounds and intact distal pulses.   No murmur heard. Pulmonary/Chest: Effort normal and breath sounds normal. He has no wheezes. He has no rales. He exhibits no tenderness.  Abdominal:  Soft. Bowel sounds are normal. He exhibits no distension and no mass. There is no tenderness.  Musculoskeletal: Normal range of motion.  Crepitus on flexion-extension of right knee  Neurological: He is alert and oriented to person, place, and time.     Assessment & Plan:   1. Type 2 diabetes mellitus with other specified complication (HCC) Diet controlled with A1c of 6.5 - Glucose (CBG) - HgB A1c - COMPLETE METABOLIC PANEL WITH GFR - Microalbumin / creatinine urine ratio - Lipid panel - Ambulatory referral to Ophthalmology - Ambulatory referral to Podiatry  2. Acute idiopathic gout, unspecified site No recent flare in But he has had frequent flares in the last few months and so I will reduce his dose of allopurinol Advised against eating foods that trigger gout - Uric Acid - allopurinol (ZYLOPRIM) 300 MG tablet; Take 1 tablet (300 mg total) by mouth 2 (two) times daily.  Dispense: 60 tablet; Refill: 3 - colchicine 0.6 MG tablet; Take 2 tabs (1.2mg ) at the onset of a gout attack, may repeat 1 tab (0.6mg ) after 1 hour if symptoms persist.  Dispense: 30 tablet; Refill: 0  3. Knee pain, acute, right Could be underlying osteoarthritis Place on NSAIDs - naproxen (NAPROSYN) 500 MG tablet; Take 1 tablet (500 mg total) by mouth 2 (two) times daily with a meal. As needed for knee pain  Dispense: 30 tablet; Refill: 2  4. Muscle spasm Could explain pain and hamstrings - cyclobenzaprine (FLEXERIL) 10 MG tablet; Take 1 tablet (10 mg total) by mouth 3 (three) times daily as needed for muscle spasms.  Dispense: 30 tablet; Refill: 0  5. Corn Referred to podiatry   Meds ordered this encounter  Medications  . naproxen (NAPROSYN) 500 MG tablet    Sig: Take 1 tablet (500 mg total) by mouth 2 (two) times daily with a meal. As needed for knee pain    Dispense:  30 tablet    Refill:  2  . allopurinol (ZYLOPRIM) 300 MG tablet    Sig: Take 1 tablet (300 mg total) by mouth 2 (two) times daily.     Dispense:  60 tablet    Refill:  3  . cyclobenzaprine (FLEXERIL) 10 MG tablet    Sig: Take 1 tablet (10 mg total) by mouth 3 (three) times daily as needed for muscle spasms.    Dispense:  30 tablet    Refill:  0  . colchicine 0.6 MG tablet    Sig: Take 2 tabs (1.2mg ) at the onset of a gout attack, may repeat 1 tab (0.6mg ) after 1 hour if symptoms persist.    Dispense:  30 tablet    Refill:  0    Follow-up: Return in about 3 months (around 12/22/2015) for follow up on Diabetes Mellitus.   Arnoldo Morale MD

## 2015-09-22 ENCOUNTER — Emergency Department (HOSPITAL_COMMUNITY): Payer: PRIVATE HEALTH INSURANCE

## 2015-09-22 ENCOUNTER — Emergency Department (HOSPITAL_COMMUNITY)
Admission: EM | Admit: 2015-09-22 | Discharge: 2015-09-22 | Disposition: A | Discharge status: Home / Self Care | Payer: PRIVATE HEALTH INSURANCE | Attending: Emergency Medicine | Admitting: Emergency Medicine

## 2015-09-22 ENCOUNTER — Encounter (HOSPITAL_COMMUNITY): Payer: Self-pay | Admitting: Emergency Medicine

## 2015-09-22 DIAGNOSIS — M5431 Sciatica, right side: Secondary | ICD-10-CM | POA: Insufficient documentation

## 2015-09-22 DIAGNOSIS — M7918 Myalgia, other site: Secondary | ICD-10-CM

## 2015-09-22 DIAGNOSIS — F17210 Nicotine dependence, cigarettes, uncomplicated: Secondary | ICD-10-CM | POA: Insufficient documentation

## 2015-09-22 DIAGNOSIS — M79604 Pain in right leg: Secondary | ICD-10-CM | POA: Diagnosis present

## 2015-09-22 LAB — MICROALBUMIN / CREATININE URINE RATIO
Creatinine, Urine: 171 mg/dL (ref 20–370)
Microalb Creat Ratio: 57 mcg/mg creat — ABNORMAL HIGH (ref <30)
Microalb, Ur: 9.8 mg/dL

## 2015-09-22 MED ORDER — METHOCARBAMOL 500 MG PO TABS
1000.0000 mg | ORAL_TABLET | Freq: Once | ORAL | Status: AC
  Administered 2015-09-22: 1000 mg via ORAL
  Filled 2015-09-22: qty 2

## 2015-09-22 MED ORDER — METHOCARBAMOL 500 MG PO TABS: 1000.0000 mg | ORAL_TABLET | Freq: Four times a day (QID) | ORAL | 0 refills | Status: DC | PRN

## 2015-09-22 NOTE — ED Triage Notes (Signed)
Pt c/o right buttock pain that radiates down right leg x 1 month. Pt seen by PMD for same yesterday and requested an xray but the Dr never did one. Pt reports that he had to call out of work today due to pain.

## 2015-09-22 NOTE — Discharge Instructions (Signed)

## 2015-09-22 NOTE — ED Provider Notes (Signed)
[facility] DEPT Provider Note   CSN: [id number] Arrival date & time: 09/22/15  1438  By signing my name below, I, Royce Macadamia, attest that this documentation has been prepared under the direction and in the presence of [employer], PA-C. Electronically Signed: Royce Macadamia, ED Scribe. 09/22/15. 4:29 PM.   History   Chief Complaint Chief Complaint  Patient presents with  . Leg Pain    The history is provided by the patient. No language interpreter was used.     HPI Comments:  Terry Poole is a 50 y.o. male who presents to the Emergency Department complaining of 9.5/10 pain in his right leg radiating to this bilateral shoulders and arms beginning yesterday.  He describes the pain as "turning into his bones" and states that it sometimes prevents him from raising his arms.  On the back of this thigh he feels like something is "torn loose."  He also notes that his knee has begun hurting when he walks.  He denies injury. He saw his PCP yesterday and asked for an X-ray, but it was never done.  He is taking colchicine for his gout and took his last dose yesterday.  He denies fever and tick bites.     Past Medical History:  Diagnosis Date  . Bronchitis   . Bronchitis   . Gout     Patient Active Problem List   Diagnosis Date Noted  . Onychomycosis 04/16/2015  . Pseudofolliculitis barbae [id number]  . Gout attack 03/28/2015    History reviewed. No pertinent surgical history.     Home Medications    Prior to Admission medications   Medication Sig Start Date End Date Taking? Authorizing Provider  allopurinol (ZYLOPRIM) 300 MG tablet Take 1 tablet (300 mg total) by mouth 2 (two) times daily. 09/21/15   Arnoldo Morale, MD  Blood Glucose Monitoring Suppl (TRUE METRIX METER) DEVI 1 each by Does not apply route daily before breakfast. Patient not taking: Reported on 09/21/2015 04/16/15   Arnoldo Morale, MD  colchicine 0.6 MG tablet Take 2 tabs (1.2mg ) at the onset of a  gout attack, may repeat 1 tab (0.6mg ) after 1 hour if symptoms persist. 09/21/15   Arnoldo Morale, MD  cyclobenzaprine (FLEXERIL) 10 MG tablet Take 1 tablet (10 mg total) by mouth 3 (three) times daily as needed for muscle spasms. 09/21/15   Arnoldo Morale, MD  glucose blood (TRUE METRIX BLOOD GLUCOSE TEST) test strip Use daily before breakfast Patient not taking: Reported on 09/21/2015 04/16/15   Arnoldo Morale, MD  methocarbamol (ROBAXIN) 500 MG tablet Take 2 tablets (1,000 mg total) by mouth 4 (four) times daily as needed (Pain). 09/22/15   Audreana Hancox, PA-C  naproxen (NAPROSYN) 500 MG tablet Take 1 tablet (500 mg total) by mouth 2 (two) times daily with a meal. As needed for knee pain 09/21/15   Arnoldo Morale, MD  TRUEPLUS LANCETS 28G MISC 1 each by Does not apply route daily before breakfast. Patient not taking: Reported on 09/21/2015 04/16/15   Arnoldo Morale, MD    Family History No family history on file.  Social History Social History  Substance Use Topics  . Smoking status: Current Every Day Smoker    Packs/day: 0.50    Types: Cigarettes  . Smokeless tobacco: Never Used  . Alcohol use No     Allergies   Tramadol and Vicodin [hydrocodone-acetaminophen]   Review of Systems Review of Systems  All other systems reviewed and are negative.  10 systems reviewed and all  are negative for acute change except as noted in the HPI.   Physical Exam Updated Vital Signs BP 142/100 (BP Location: Right Arm)   Pulse 80   Temp 98 F (36.7 C) (Oral)   Resp 16   Ht 5\' 9"  (1.753 m)   Wt 104.8 kg   SpO2 98%   BMI 34.11 kg/m   Physical Exam  Constitutional: He appears well-developed and well-nourished.  HENT:  Head: Normocephalic.  Eyes: Conjunctivae are normal.  Cardiovascular: Normal rate.   Pulmonary/Chest: Effort normal. No respiratory distress.  Abdominal: He exhibits no distension.  Musculoskeletal: Normal range of motion. He exhibits no edema, tenderness or deformity.  Right  knee:  No deformity, erythema or abrasions. FROM. No effusion ++ crepitance. Anterior and posterior drawer show no abnormal laxity. Stable to valgus and varus stress. Joint lines are non-tender. Neurovascularly intact. Pt ambulates with non-antalgic gait.   Bilateral shoulders:  No deformity, erythema or abrasions. FROM. No effusion or crepitance. Anterior and posterior drawer show no abnormal laxity. Stable to valgus and varus stress. Joint lines are non-tender. Neurovascularly intact. Pt ambulates with non-antalgic gait.    Neurological: He is alert.  No point tenderness to percussion of lumbar spinal processes.  No TTP or paraspinal muscular spasm. Strength is 5 out of 5 to bilateral lower extremities at hip and knee; extensor hallucis longus 5 out of 5. Ankle strength 5 out of 5, no clonus, neurovascularly intact. No saddle anaesthesia. Patellar reflexes are 2+ bilaterally.    Right leg raise is positive on the ipsilateral side at 45 and positive on the contralateral side at 60.   Skin: Skin is warm and dry.  Psychiatric: He has a normal mood and affect. His behavior is normal.  Nursing note and vitals reviewed.    ED Treatments / Results   DIAGNOSTIC STUDIES:  Oxygen Saturation is 99% on RA, nml by my interpretation.    COORDINATION OF CARE:  4:29 PM Discussed treatment plan with pt at bedside and pt agreed to plan.  Labs (all labs ordered are listed, but only abnormal results are displayed) Labs Reviewed - No data to display  EKG  EKG Interpretation None       Radiology Dg Lumbar Spine Complete  Result Date: 09/22/2015 CLINICAL DATA:  Right side hip and shooting leg pains x2 months. Sciatica. No known injury. No hx of spinal or hip injuries or surgeries. EXAM: LUMBAR SPINE - COMPLETE 4+ VIEW COMPARISON:  11/21/2014 FINDINGS: Minimal S shaped lumbar spine curvature. Five lumbar type vertebral bodies. Sacroiliac joints are symmetric. Moderate thoracic spondylosis,  including degenerative disc disease at L3-4, L2-3. This is slightly increased at L2-3. Facet arthropathy involves L3-4 and L4-5. IMPRESSION: Progressive spondylosis, without acute osseous abnormality. Electronically Signed   By: Abigail Miyamoto M.D.   On: 09/22/2015 17:54    Procedures Procedures (including critical care time)  Medications Ordered in ED Medications  methocarbamol (ROBAXIN) tablet 1,000 mg (1,000 mg Oral Given 09/22/15 1654)     Initial Impression / Assessment and Plan / ED Course  I have reviewed the triage vital signs and the nursing notes.  Pertinent labs & imaging results that were available during my care of the patient were reviewed by me and considered in my medical decision making (see chart for details).  Clinical Course    Vitals:   09/22/15 1451 09/22/15 1452 09/22/15 1824  BP: (!) 146/101  142/100  Pulse: 89  80  Resp: 18  16  Temp: 97.9  F (36.6 C)  98 F (36.7 C)  TempSrc: Oral  Oral  SpO2: 99%  98%  Weight:  104.8 kg   Height:  5\' 9"  (1.753 m)     Medications  methocarbamol (ROBAXIN) tablet 1,000 mg (1,000 mg Oral Given 09/22/15 1654)    Terry Poole is 50 y.o. male presenting with pain consistent with a lumbar radiculopathy, no abnormalities to suggest cord compression or intraspinous infection. patient Requesting  x-ray, with no acute abnormalities. Patient given Robaxin and advised to follow closely with primary care.  Evaluation does not show pathology that would require ongoing emergent intervention or inpatient treatment. Pt is hemodynamically stable and mentating appropriately. Discussed findings and plan with patient/guardian, who agrees with care plan. All questions answered. Return precautions discussed and outpatient follow up given.      Final Clinical Impressions(s) / ED Diagnoses   Final diagnoses:  Sciatica of right side  Musculoskeletal pain    New Prescriptions Discharge Medication List as of 09/22/2015  6:08 PM    START  taking these medications   Details  methocarbamol (ROBAXIN) 500 MG tablet Take 2 tablets (1,000 mg total) by mouth 4 (four) times daily as needed (Pain)., Starting Sat 09/22/2015, Print       I personally performed the services described in this documentation, which was scribed in my presence. The recorded information has been reviewed and is accurate.     Monico Blitz, PA-C 09/23/15 [place], DO 09/23/15 1502

## 2015-09-24 MED FILL — METHOCARBAMOL 500 MG TABLET: 500 | 2 days supply | Qty: 20 | Fill #0

## 2015-09-25 ENCOUNTER — Telehealth: Payer: Self-pay

## 2015-09-25 ENCOUNTER — Telehealth: Payer: Self-pay | Admitting: Family Medicine

## 2015-09-25 NOTE — Telephone Encounter (Signed)
Pt. Returned call. Please f/u °

## 2015-09-25 NOTE — Telephone Encounter (Signed)
-----   Message from Arnoldo Morale, MD sent at 09/24/2015  2:21 PM EDT ----- Labs reveal elevated uric acid in keeping with uncontrolled Gout. Triglycerides are elevated and he is advised to take over-the-counter fish oil capsules

## 2015-09-25 NOTE — Telephone Encounter (Signed)
Writer called patient per Dr. Jarold Song to discuss lab results.  Patient was at work and Probation officer left name and number for patient to call back.

## 2015-09-26 ENCOUNTER — Telehealth: Payer: Self-pay

## 2015-09-26 NOTE — Telephone Encounter (Signed)
Patient called back today regarding lab resuts.  He stated an understanding to pick up OTC fish oil due to his elevated triglycerides.  He will take one capsule daily.  Patient also stated an understanding that his uric acid level is elevated and this is the reason for the pain he is feeling in his knees- his gout is not controlled at this time.  Patient will continue to take his allopurinal and colchicine.  Patient encouraged to call if the medication is not improving his symptoms. Patient reminded to call at the beginning of November to schedule an appt with Dr. Jarold Song.

## 2015-09-26 NOTE — Telephone Encounter (Signed)
-----   Message from Arnoldo Morale, MD sent at 09/24/2015  2:21 PM EDT ----- Labs reveal elevated uric acid in keeping with uncontrolled Gout. Triglycerides are elevated and he is advised to take over-the-counter fish oil capsules

## 2015-10-09 ENCOUNTER — Ambulatory Visit: Payer: PRIVATE HEALTH INSURANCE | Admitting: Sports Medicine

## 2015-10-30 ENCOUNTER — Ambulatory Visit: Payer: PRIVATE HEALTH INSURANCE | Admitting: Sports Medicine

## 2015-11-02 ENCOUNTER — Encounter (HOSPITAL_COMMUNITY): Payer: Self-pay | Admitting: *Deleted

## 2015-11-02 ENCOUNTER — Emergency Department (HOSPITAL_COMMUNITY)
Admission: EM | Admit: 2015-11-02 | Discharge: 2015-11-02 | Disposition: A | Discharge status: Home / Self Care | Payer: PRIVATE HEALTH INSURANCE | Attending: Emergency Medicine | Admitting: Emergency Medicine

## 2015-11-02 DIAGNOSIS — F17210 Nicotine dependence, cigarettes, uncomplicated: Secondary | ICD-10-CM | POA: Insufficient documentation

## 2015-11-02 DIAGNOSIS — M25561 Pain in right knee: Secondary | ICD-10-CM

## 2015-11-02 MED ORDER — ACETAMINOPHEN 500 MG PO TABS
1000.0000 mg | ORAL_TABLET | Freq: Once | ORAL | Status: AC
  Administered 2015-11-02: 1000 mg via ORAL
  Filled 2015-11-02: qty 2

## 2015-11-02 MED ORDER — IBUPROFEN 800 MG PO TABS
800.0000 mg | ORAL_TABLET | Freq: Once | ORAL | Status: AC
  Administered 2015-11-02: 800 mg via ORAL
  Filled 2015-11-02: qty 1

## 2015-11-02 NOTE — ED Triage Notes (Signed)
Pt reports right lower leg pain to knee and behind his knee. Reports intermittent swelling and pain down into his right foot. Denies injury. Ambulatory at triage.

## 2015-11-02 NOTE — ED Provider Notes (Signed)
[facility] Provider [facility]   CSN: [id number] Arrival date & time: 11/02/15  1723     History   Chief Complaint Chief Complaint  Patient presents with  . Leg Pain    HPI Terry Poole is a 50 y.o. male.  50 yo M with R leg pain.  Feels like it will give out, pain worst at the right knee.  Worse with movement, palpation.  Feels that its due to stressors at home.  Denies injury.     The history is provided by the patient.  Back Pain   Pertinent negatives include no chest pain, no fever, no headaches and no abdominal pain.  Knee Pain   This is a recurrent problem. The current episode started more than 1 week ago. The problem occurs constantly. The problem has not changed since onset.The pain is present in the right knee. The quality of the pain is described as aching. The pain is at a severity of 7/10. The pain is moderate. He has tried nothing for the symptoms. The treatment provided no relief. There has been no history of extremity trauma.    Past Medical History:  Diagnosis Date  . Bronchitis   . Bronchitis   . Gout     Patient Active Problem List   Diagnosis Date Noted  . Onychomycosis 04/16/2015  . Pseudofolliculitis barbae [id number]  . Gout attack 03/28/2015    History reviewed. No pertinent surgical history.     Home Medications    Prior to Admission medications   Medication Sig Start Date End Date Taking? Authorizing Provider  allopurinol (ZYLOPRIM) 300 MG tablet Take 1 tablet (300 mg total) by mouth 2 (two) times daily. 09/21/15   Arnoldo Morale, MD  Blood Glucose Monitoring Suppl (TRUE METRIX METER) DEVI 1 each by Does not apply route daily before breakfast. Patient not taking: Reported on 09/21/2015 04/16/15   Arnoldo Morale, MD  colchicine 0.6 MG tablet Take 2 tabs (1.2mg ) at the onset of a gout attack, may repeat 1 tab (0.6mg ) after 1 hour if symptoms persist. 09/21/15   Arnoldo Morale, MD  cyclobenzaprine (FLEXERIL) 10 MG tablet Take 1 tablet (10 mg  total) by mouth 3 (three) times daily as needed for muscle spasms. 09/21/15   Arnoldo Morale, MD  glucose blood (TRUE METRIX BLOOD GLUCOSE TEST) test strip Use daily before breakfast Patient not taking: Reported on 09/21/2015 04/16/15   Arnoldo Morale, MD  methocarbamol (ROBAXIN) 500 MG tablet Take 2 tablets (1,000 mg total) by mouth 4 (four) times daily as needed (Pain). 09/22/15   Nicole Pisciotta, PA-C  naproxen (NAPROSYN) 500 MG tablet Take 1 tablet (500 mg total) by mouth 2 (two) times daily with a meal. As needed for knee pain 09/21/15   Arnoldo Morale, MD  TRUEPLUS LANCETS 28G MISC 1 each by Does not apply route daily before breakfast. Patient not taking: Reported on 09/21/2015 04/16/15   Arnoldo Morale, MD    Family History History reviewed. No pertinent family history.  Social History Social History  Substance Use Topics  . Smoking status: Current Every Day Smoker    Packs/day: 0.50    Types: Cigarettes  . Smokeless tobacco: Never Used  . Alcohol use No     Allergies   Tramadol and Vicodin [hydrocodone-acetaminophen]   Review of Systems Review of Systems  Constitutional: Negative for chills and fever.  HENT: Negative for congestion and facial swelling.   Eyes: Negative for discharge and visual disturbance.  Respiratory: Negative for shortness of breath.  Cardiovascular: Negative for chest pain and palpitations.  Gastrointestinal: Negative for abdominal pain, diarrhea and vomiting.  Musculoskeletal: Positive for back pain. Negative for arthralgias and myalgias.  Skin: Negative for color change and rash.  Neurological: Negative for tremors, syncope and headaches.  Psychiatric/Behavioral: Negative for confusion and dysphoric mood.     Physical Exam Updated Vital Signs BP 152/100 (BP Location: Right Arm)   Pulse 91   Resp 16   SpO2 97%   Physical Exam  Constitutional: He is oriented to person, place, and time. He appears well-developed and well-nourished.  HENT:  Head:  Normocephalic and atraumatic.  Eyes: EOM are normal. Pupils are equal, round, and reactive to light.  Neck: Normal range of motion. Neck supple. No JVD present.  Cardiovascular: Normal rate and regular rhythm.  Exam reveals no gallop and no friction rub.   No murmur heard. Pulmonary/Chest: No respiratory distress. He has no wheezes.  Abdominal: He exhibits no distension and no mass. There is no tenderness. There is no rebound and no guarding.  Musculoskeletal: Normal range of motion. He exhibits edema (trace) and tenderness.  Mild diffuse tenderness to the right knee.  Full ROM, PMS intact distally.  No noted erythema, some crepitus with test of the meniscus though no pain.  No ligamentous laxity.   Neurological: He is alert and oriented to person, place, and time.  Skin: No rash noted. No pallor.  Psychiatric: He has a normal mood and affect. His behavior is normal.  Nursing note and vitals reviewed.    ED Treatments / Results  Labs (all labs ordered are listed, but only abnormal results are displayed) Labs Reviewed - No data to display  EKG  EKG Interpretation None       Radiology No results found.  Procedures Procedures (including critical care time)  Medications Ordered in ED Medications  acetaminophen (TYLENOL) tablet 1,000 mg (1,000 mg Oral Given 11/02/15 2134)  ibuprofen (ADVIL,MOTRIN) tablet 800 mg (800 mg Oral Given 11/02/15 2133)     Initial Impression / Assessment and Plan / ED Course  I have reviewed the triage vital signs and the nursing notes.  Pertinent labs & imaging results that were available during my care of the patient were reviewed by me and considered in my medical decision making (see chart for details).  Clinical Course    50 yo M with R knee pain.  Denies injury. Feel no need for imaging, Will place in knee sleeve, given ortho follow up.   11:31 PM:  I have discussed the diagnosis/risks/treatment options with the patient and believe the pt to  be eligible for discharge home to follow-up with Ortho. We also discussed returning to the ED immediately if new or worsening sx occur. We discussed the sx which are most concerning (e.g., sudden worsening pain, fever, inability to tolerate by mouth) that necessitate immediate return. Medications administered to the patient during their visit and any new prescriptions provided to the patient are listed below.  Medications given during this visit Medications  acetaminophen (TYLENOL) tablet 1,000 mg (1,000 mg Oral Given 11/02/15 2134)  ibuprofen (ADVIL,MOTRIN) tablet 800 mg (800 mg Oral Given 11/02/15 2133)     The patient appears reasonably screen and/or stabilized for discharge and I doubt any other medical condition or other [facility] requiring further screening, evaluation, or treatment in the ED at this time prior to discharge.    Final Clinical Impressions(s) / ED Diagnoses   Final diagnoses:  Right knee pain    New  Prescriptions Discharge Medication List as of 11/02/2015  9:15 PM       Deno Etienne, DO 11/02/15 2331

## 2015-11-02 NOTE — Discharge Instructions (Signed)
Take 4 over the counter ibuprofen tablets 3 times a day or 2 over-the-counter naproxen tablets twice a day for pain. Also take tylenol 1000mg(2 extra strength) four times a day.    

## 2015-11-02 NOTE — ED Notes (Signed)
Pt given work note and knee sleeve.

## 2015-11-13 ENCOUNTER — Encounter: Payer: Self-pay | Admitting: Sports Medicine

## 2015-11-13 ENCOUNTER — Ambulatory Visit (INDEPENDENT_AMBULATORY_CARE_PROVIDER_SITE_OTHER): Payer: PRIVATE HEALTH INSURANCE | Admitting: Sports Medicine

## 2015-11-13 DIAGNOSIS — L84 Corns and callosities: Secondary | ICD-10-CM | POA: Diagnosis not present

## 2015-11-13 DIAGNOSIS — M2040 Other hammer toe(s) (acquired), unspecified foot: Secondary | ICD-10-CM | POA: Diagnosis not present

## 2015-11-13 DIAGNOSIS — B351 Tinea unguium: Secondary | ICD-10-CM

## 2015-11-13 DIAGNOSIS — M79674 Pain in right toe(s): Secondary | ICD-10-CM | POA: Diagnosis not present

## 2015-11-13 DIAGNOSIS — M79675 Pain in left toe(s): Secondary | ICD-10-CM | POA: Diagnosis not present

## 2015-11-13 DIAGNOSIS — E1142 Type 2 diabetes mellitus with diabetic polyneuropathy: Secondary | ICD-10-CM | POA: Diagnosis not present

## 2015-11-13 NOTE — Patient Instructions (Signed)
Diabetes and Foot Care Diabetes may cause you to have problems because of poor blood supply (circulation) to your feet and legs. This may cause the skin on your feet to become thinner, break easier, and heal more slowly. Your skin may become dry, and the skin may peel and crack. You may also have nerve damage in your legs and feet causing decreased feeling in them. You may not notice minor injuries to your feet that could lead to infections or more serious problems. Taking care of your feet is one of the most important things you can do for yourself.  HOME CARE INSTRUCTIONS  Wear shoes at all times, even in the house. Do not go barefoot. Bare feet are easily injured.  Check your feet daily for blisters, cuts, and redness. If you cannot see the bottom of your feet, use a mirror or ask someone for help.  Wash your feet with warm water (do not use hot water) and mild soap. Then pat your feet and the areas between your toes until they are completely dry. Do not soak your feet as this can dry your skin.  Apply a moisturizing lotion or petroleum jelly (that does not contain alcohol and is unscented) to the skin on your feet and to dry, brittle toenails. Do not apply lotion between your toes.  Trim your toenails straight across. Do not dig under them or around the cuticle. File the edges of your nails with an emery board or nail file.  Do not cut corns or calluses or try to remove them with medicine.  Wear clean socks or stockings every day. Make sure they are not too tight. Do not wear knee-high stockings since they may decrease blood flow to your legs.  Wear shoes that fit properly and have enough cushioning. To break in new shoes, wear them for just a few hours a day. This prevents you from injuring your feet. Always look in your shoes before you put them on to be sure there are no objects inside.  Do not cross your legs. This may decrease the blood flow to your feet.  If you find a minor scrape,  cut, or break in the skin on your feet, keep it and the skin around it clean and dry. These areas may be cleansed with mild soap and water. Do not cleanse the area with peroxide, alcohol, or iodine.  When you remove an adhesive bandage, be sure not to damage the skin around it.  If you have a wound, look at it several times a day to make sure it is healing.  Do not use heating pads or hot water bottles. They may burn your skin. If you have lost feeling in your feet or legs, you may not know it is happening until it is too late.  Make sure your health care provider performs a complete foot exam at least annually or more often if you have foot problems. Report any cuts, sores, or bruises to your health care provider immediately. SEEK MEDICAL CARE IF:   You have an injury that is not healing.  You have cuts or breaks in the skin.  You have an ingrown nail.  You notice redness on your legs or feet.  You feel burning or tingling in your legs or feet.  You have pain or cramps in your legs and feet.  Your legs or feet are numb.  Your feet always feel cold. SEEK IMMEDIATE MEDICAL CARE IF:   There is increasing redness,   swelling, or pain in or around a wound.  There is a red line that goes up your leg.  Pus is coming from a wound.  You develop a fever or as directed by your health care provider.  You notice a bad smell coming from an ulcer or wound.   This information is not intended to replace advice given to you by your health care provider. Make sure you discuss any questions you have with your health care provider.   Document Released: 01/18/2000 Document Revised: 09/22/2012 Document Reviewed: 06/29/2012 Elsevier Interactive Patient Education 2016 [employer]  

## 2015-11-13 NOTE — Progress Notes (Signed)
Subjective: Terry Poole is a 50 y.o. male patient with history of diabetes who presents to office today complaining of painful callus  while ambulating in shoes; unable to trim. Patient states that the glucose reading this morning was not recorded; does not check. Patient denies any new changes in medication or new problems. Patient denies any new cramping, numbness, burning or tingling in the legs.  Patient Active Problem List   Diagnosis Date Noted  . Onychomycosis 04/16/2015  . Pseudofolliculitis barbae [id number]  . Gout attack 03/28/2015   Current Outpatient Prescriptions on File Prior to Visit  Medication Sig Dispense Refill  . allopurinol (ZYLOPRIM) 300 MG tablet Take 1 tablet (300 mg total) by mouth 2 (two) times daily. 60 tablet 3  . Blood Glucose Monitoring Suppl (TRUE METRIX METER) DEVI 1 each by Does not apply route daily before breakfast. (Patient not taking: Reported on 09/21/2015) 1 Device 0  . colchicine 0.6 MG tablet Take 2 tabs (1.2mg ) at the onset of a gout attack, may repeat 1 tab (0.6mg ) after 1 hour if symptoms persist. 30 tablet 0  . cyclobenzaprine (FLEXERIL) 10 MG tablet Take 1 tablet (10 mg total) by mouth 3 (three) times daily as needed for muscle spasms. 30 tablet 0  . glucose blood (TRUE METRIX BLOOD GLUCOSE TEST) test strip Use daily before breakfast (Patient not taking: Reported on 09/21/2015) 30 each 5  . methocarbamol (ROBAXIN) 500 MG tablet Take 2 tablets (1,000 mg total) by mouth 4 (four) times daily as needed (Pain). 20 tablet 0  . naproxen (NAPROSYN) 500 MG tablet Take 1 tablet (500 mg total) by mouth 2 (two) times daily with a meal. As needed for knee pain 30 tablet 2  . TRUEPLUS LANCETS 28G MISC 1 each by Does not apply route daily before breakfast. (Patient not taking: Reported on 09/21/2015) 30 each 5   No current facility-administered medications on file prior to visit.    Allergies  Allergen Reactions  . Tramadol Hives  . Vicodin  [Hydrocodone-Acetaminophen] Hives    Recent Results (from the past 2160 hour(s))  Glucose (CBG)     Status: Abnormal   Collection Time: 09/21/15 10:17 AM  Result Value Ref Range   POC Glucose 168 (A) 70 - 99 mg/dl  HgB A1c     Status: Abnormal   Collection Time: 09/21/15 10:23 AM  Result Value Ref Range   Hemoglobin A1C 6.5   COMPLETE METABOLIC PANEL WITH GFR     Status: Abnormal   Collection Time: 09/21/15 10:53 AM  Result Value Ref Range   Sodium 141 135 - 146 mmol/L   Potassium 3.9 3.5 - 5.3 mmol/L   Chloride 105 98 - 110 mmol/L   CO2 25 20 - 31 mmol/L   Glucose, Bld 149 (H) 65 - 99 mg/dL   BUN 9 7 - 25 mg/dL   Creat 0.94 0.70 - 1.33 mg/dL    Comment:   For patients > or = 50 years of age: The upper reference limit for Creatinine is approximately 13% higher for people identified as African-American.      Total Bilirubin 0.4 0.2 - 1.2 mg/dL   Alkaline Phosphatase 128 (H) 40 - 115 U/L   AST 22 10 - 35 U/L   ALT 44 9 - 46 U/L   Total Protein 7.0 6.1 - 8.1 g/dL   Albumin 4.2 3.6 - 5.1 g/dL   Calcium 9.3 8.6 - 10.3 mg/dL   GFR, Est African American >89 >=60 mL/min  GFR, Est Non African American >89 >=60 mL/min  Microalbumin / creatinine urine ratio     Status: Abnormal   Collection Time: 09/21/15 10:53 AM  Result Value Ref Range   Creatinine, Urine 171 20 - 370 mg/dL   Microalb, Ur 9.8 Not estab mg/dL   Microalb Creat Ratio 57 (H) <30 mcg/mg creat    Comment: The ADA has defined abnormalities in albumin excretion as follows:           Category           Result                            (mcg/mg creatinine)                 Normal:    <30       Microalbuminuria:    30 - 299   Clinical albuminuria:    > or = 300   The ADA recommends that at least two of three specimens collected within a 3 - 6 month period be abnormal before considering a patient to be within a diagnostic category.     Lipid panel     Status: Abnormal   Collection Time: 09/21/15 10:53 AM  Result  Value Ref Range   Cholesterol 157 125 - 200 mg/dL   Triglycerides 233 (H) <150 mg/dL   HDL 60 >=40 mg/dL   Total CHOL/HDL Ratio 2.6 <=5.0 Ratio   VLDL 47 (H) <30 mg/dL   LDL Cholesterol 50 <130 mg/dL    Comment:   Total Cholesterol/HDL Ratio:CHD Risk                        Coronary Heart Disease Risk Table                                        Men       Women          1/2 Average Risk              3.4        3.3              Average Risk              5.0        4.4           2X Average Risk              9.6        7.1           3X Average Risk             23.4       11.0 Use the calculated Patient Ratio above and the CHD Risk table  to determine the patient's CHD Risk.   Uric Acid     Status: Abnormal   Collection Time: 09/21/15 10:53 AM  Result Value Ref Range   Uric Acid, Serum 8.7 (H) 4.0 - 8.0 mg/dL    Comment: Therapeutic target for gout patients: <6.0 mg/dL    Objective: General: Patient is awake, alert, and oriented x 3 and in no acute distress.  Integument: Skin is warm, dry and supple bilateral. Nails are short, thickened and dystrophic with subungual debris, consistent with onychomycosis, 1-5 bilateral, recently self trimmed. No  signs of infection. + callus at medial right 2nd toe. Remaining integument unremarkable.  Vasculature:  Dorsalis Pedis pulse 2/4 bilateral. Posterior Tibial pulse  1/4 bilateral. Capillary fill time <3 sec 1-5 bilateral. Positive hair growth to the level of the digits.Temperature gradient within normal limits. No varicosities present bilateral. No edema present bilateral.   Neurology: The patient has diminished sensation measured with a 5.07/10g Semmes Weinstein Monofilament at all pedal sites bilateral . Vibratory sensation diminished bilateral with tuning fork. No Babinski sign present bilateral.   Musculoskeletal:Hammertoe pedal deformities noted bilateral. Muscular strength 5/5 in all lower extremity muscular groups bilateral without pain on  range of motion . No tenderness with calf compression bilateral.  Assessment and Plan: Problem List Items Addressed This Visit    None    Visit Diagnoses    Foot callus    -  Primary   Dermatophytosis of nail       Toe pain, bilateral       Diabetic polyneuropathy associated with type 2 diabetes mellitus (HCC)       Hammer toe, unspecified laterality          -Examined patient. -Discussed and educated patient on diabetic foot care, especially with  regards to the vascular, neurological and musculoskeletal systems.  -Stressed the importance of good glycemic control and the detriment of not controlling glucose levels in relation to the foot. -Mechanically debrided callus x1 using sterile blade without incident -Gave toe spacer   -Answered all patient questions -Patient to return  in 3 months for at risk foot care -Patient advised to call the office if any problems or questions arise in the meantime.  Landis Martins, DPM

## 2016-01-20 ENCOUNTER — Emergency Department (HOSPITAL_COMMUNITY)
Admission: EM | Admit: 2016-01-20 | Discharge: 2016-01-20 | Disposition: A | Discharge status: Home / Self Care | Payer: PRIVATE HEALTH INSURANCE | Attending: Emergency Medicine | Admitting: Emergency Medicine

## 2016-01-20 ENCOUNTER — Encounter (HOSPITAL_COMMUNITY): Payer: Self-pay | Admitting: Emergency Medicine

## 2016-01-20 ENCOUNTER — Emergency Department (HOSPITAL_COMMUNITY): Payer: PRIVATE HEALTH INSURANCE

## 2016-01-20 DIAGNOSIS — S0993XA Unspecified injury of face, initial encounter: Secondary | ICD-10-CM | POA: Diagnosis present

## 2016-01-20 DIAGNOSIS — Y999 Unspecified external cause status: Secondary | ICD-10-CM | POA: Diagnosis not present

## 2016-01-20 DIAGNOSIS — Y929 Unspecified place or not applicable: Secondary | ICD-10-CM | POA: Insufficient documentation

## 2016-01-20 DIAGNOSIS — F17210 Nicotine dependence, cigarettes, uncomplicated: Secondary | ICD-10-CM | POA: Diagnosis not present

## 2016-01-20 DIAGNOSIS — Y9389 Activity, other specified: Secondary | ICD-10-CM | POA: Insufficient documentation

## 2016-01-20 DIAGNOSIS — S0083XA Contusion of other part of head, initial encounter: Secondary | ICD-10-CM | POA: Diagnosis not present

## 2016-01-20 DIAGNOSIS — R6884 Jaw pain: Secondary | ICD-10-CM

## 2016-01-20 MED ORDER — IBUPROFEN 400 MG PO TABS
400.0000 mg | ORAL_TABLET | Freq: Once | ORAL | Status: AC
  Administered 2016-01-20: 400 mg via ORAL
  Filled 2016-01-20: qty 1
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
## 2016-01-20 NOTE — Discharge Instructions (Signed)
Alternate between ibuprofen and tylenol as needed for pain. Use Ice to areas of soreness, no more than 20 minutes at a time every hour. Follow up with primary care physician for recheck of ongoing symptoms in the next 1-2 weeks. Return to ER for emergent changing or worsening of symptoms.

## 2016-01-20 NOTE — ED Notes (Signed)
Declined W/C at D/C and was escorted to lobby by RN. 

## 2016-02-01 ENCOUNTER — Emergency Department (HOSPITAL_COMMUNITY)
Admission: EM | Admit: 2016-02-01 | Discharge: 2016-02-01 | Disposition: A | Discharge status: Home / Self Care | Payer: PRIVATE HEALTH INSURANCE | Attending: Emergency Medicine | Admitting: Emergency Medicine

## 2016-02-01 ENCOUNTER — Encounter (HOSPITAL_COMMUNITY): Payer: Self-pay | Admitting: Emergency Medicine

## 2016-02-01 DIAGNOSIS — B351 Tinea unguium: Secondary | ICD-10-CM | POA: Insufficient documentation

## 2016-02-01 DIAGNOSIS — B354 Tinea corporis: Secondary | ICD-10-CM | POA: Diagnosis not present

## 2016-02-01 DIAGNOSIS — F17210 Nicotine dependence, cigarettes, uncomplicated: Secondary | ICD-10-CM | POA: Diagnosis not present

## 2016-02-01 DIAGNOSIS — Z79899 Other long term (current) drug therapy: Secondary | ICD-10-CM | POA: Diagnosis not present

## 2016-02-01 DIAGNOSIS — M109 Gout, unspecified: Secondary | ICD-10-CM | POA: Diagnosis present

## 2016-02-01 MED ORDER — MICONAZOLE NITRATE 2 % EX POWD: CUTANEOUS | 0 refills | Status: DC | PRN

## 2016-02-01 MED ORDER — INDOMETHACIN 25 MG PO CAPS
25.0000 mg | ORAL_CAPSULE | Freq: Once | ORAL | Status: AC
  Administered 2016-02-01: 25 mg via ORAL
  Filled 2016-02-01: qty 1

## 2016-02-01 MED ORDER — INDOMETHACIN 25 MG PO CAPS: 25.0000 mg | ORAL_CAPSULE | Freq: Three times a day (TID) | ORAL | 0 refills | Status: DC | PRN

## 2016-02-01 NOTE — ED Triage Notes (Signed)
Patient reports left great toe joint gout pain for several days with swelling , denies injury , he stated that he has not taken his medication for gout for several months . Ambulatory / denies fever .

## 2016-02-01 NOTE — ED Provider Notes (Signed)
[facility] DEPT Provider Note   CSN: [id number] Arrival date & time: 02/01/16  0231  History   Chief Complaint Chief Complaint  Patient presents with  . Gout   HPI Terry Poole is a 50 y.o. male.  HPI  Patient with PMH of bronchitis and gout comes to the ER with complaints of foot burning. He has been having pain for 4 days now. He  has not had a gout flair for severel months. He works 3 jobs on his feet. He reports that this does not feel like a gout flair. He has not had injury, fevers, weakness, N/V/D. He has a podiatrist that he is going to see on the first week of January. He is supposed to be seeing his PCP soon but has not scheduled an appointment.  Past Medical History:  Diagnosis Date  . Bronchitis   . Bronchitis   . Gout     Patient Active Problem List   Diagnosis Date Noted  . Onychomycosis 04/16/2015  . Pseudofolliculitis barbae [id number]  . Gout attack 03/28/2015    History reviewed. No pertinent surgical history.     Home Medications    Prior to Admission medications   Medication Sig Start Date End Date Taking? Authorizing Provider  allopurinol (ZYLOPRIM) 300 MG tablet Take 1 tablet (300 mg total) by mouth 2 (two) times daily. 09/21/15   Terry Morale, MD  Blood Glucose Monitoring Suppl (TRUE METRIX METER) DEVI 1 each by Does not apply route daily before breakfast. Patient not taking: Reported on 09/21/2015 04/16/15   Terry Morale, MD  colchicine 0.6 MG tablet Take 2 tabs (1.2mg ) at the onset of a gout attack, may repeat 1 tab (0.6mg ) after 1 hour if symptoms persist. 09/21/15   Terry Morale, MD  cyclobenzaprine (FLEXERIL) 10 MG tablet Take 1 tablet (10 mg total) by mouth 3 (three) times daily as needed for muscle spasms. 09/21/15   Terry Morale, MD  glucose blood (TRUE METRIX BLOOD GLUCOSE TEST) test strip Use daily before breakfast Patient not taking: Reported on 09/21/2015 04/16/15   Terry Morale, MD  indomethacin (INDOCIN) 25 MG capsule Take 1 capsule  (25 mg total) by mouth 3 (three) times daily as needed. 02/01/16   Terry Wilford Carlota Raspberry, PA-C  methocarbamol (ROBAXIN) 500 MG tablet Take 2 tablets (1,000 mg total) by mouth 4 (four) times daily as needed (Pain). 09/22/15   Terry Pisciotta, PA-C  miconazole (LOTRIMIN AF) 2 % powder Apply topically as needed for itching. Apply to feet BID as needed. 02/01/16   Terry Forcier Carlota Raspberry, PA-C  naproxen (NAPROSYN) 500 MG tablet Take 1 tablet (500 mg total) by mouth 2 (two) times daily with a meal. As needed for knee pain 09/21/15   Terry Morale, MD  TRUEPLUS LANCETS 28G MISC 1 each by Does not apply route daily before breakfast. Patient not taking: Reported on 09/21/2015 04/16/15   Terry Morale, MD    Family History No family history on file.  Social History Social History  Substance Use Topics  . Smoking status: Current Every Day Smoker    Packs/day: 0.50    Types: Cigarettes  . Smokeless tobacco: Never Used  . Alcohol use No     Allergies   Tramadol and Vicodin [hydrocodone-acetaminophen]   Review of Systems Review of Systems Review of Systems All other systems negative except as documented in the HPI. All pertinent positives and negatives as reviewed in the HPI.   Physical Exam Updated Vital Signs BP (!) 155/107   Pulse 87  Temp 97.9 F (36.6 C) (Oral)   Resp 18   Ht 5\' 9"  (1.753 m)   Wt 101.7 kg   SpO2 95%   BMI 33.10 kg/m   Physical Exam Constitutional: He is oriented to person, place, and time. Vital signs are normal. He appears well-developed and well-nourished.  Non-toxic appearance. No distress.  Afebrile, nontoxic, NAD  HENT:  Head: Normocephalic and atraumatic.  Mouth/Throat: Mucous membranes are normal.  Eyes: Conjunctivae and EOM are normal. Right eye exhibits no discharge. Left eye exhibits no discharge.  Neck: Normal range of motion. Neck supple.  Cardiovascular: Normal rate and intact distal pulses.   Pulmonary/Chest: Effort normal. No respiratory distress.  Abdominal:  Normal appearance. He exhibits no distension.  Musculoskeletal: patient has dark patchy rash to left foot, flaking of the toenails consistent with fungal infection..      Neurological: He is alert and oriented to person, place, and time. He has normal strength. No sensory deficit.  Skin: Skin is warm, dry and intact. No rash noted. There is no erythema. Marland Kitchen  Psychiatric: He has a normal mood and affect.  Nursing note and vitals reviewed.  ED Treatments / Results  Labs (all labs ordered are listed, but only abnormal results are displayed) Labs Reviewed - No data to display  EKG  EKG Interpretation None       Radiology No results found.  Procedures Procedures (including critical care time)  Medications Ordered in ED Medications  indomethacin (INDOCIN) capsule 25 mg (not administered)     Initial Impression / Assessment and Plan / ED Course  I have reviewed the triage vital signs and the nursing notes.  Pertinent labs & imaging results that were available during my care of the patient were reviewed by me and considered in my medical decision making (see chart for details).  Clinical Course     {Patient with tinea infection. Will treat with anti-fungal medication. Pt instructed to keep the area dry. Contact precautions given. No signs of secondary infection. Follow up with podiatrist in 2-3 days. Return precautions discussed. Pt is safe for discharge at this time.    . Medications  indomethacin (INDOCIN) capsule 25 mg (not administered)    I discussed results, diagnoses and plan with Terry Poole. They voice there understanding and questions were answered. We discussed follow-up recommendations and return precautions.   Final Clinical Impressions(s) / ED Diagnoses   Final diagnoses:  Onychomycosis  Tinea corporis    New Prescriptions New Prescriptions   INDOMETHACIN (INDOCIN) 25 MG CAPSULE    Take 1 capsule (25 mg total) by mouth 3 (three) times daily as needed.     MICONAZOLE (LOTRIMIN AF) 2 % POWDER    Apply topically as needed for itching. Apply to feet BID as needed.     Terry Haring, PA-C 02/01/16 [place], Terry Poole 02/01/16 Terry Poole

## 2016-02-12 ENCOUNTER — Ambulatory Visit: Payer: PRIVATE HEALTH INSURANCE | Admitting: Sports Medicine

## 2016-02-26 ENCOUNTER — Encounter: Payer: Self-pay | Admitting: Sports Medicine

## 2016-02-26 ENCOUNTER — Ambulatory Visit (INDEPENDENT_AMBULATORY_CARE_PROVIDER_SITE_OTHER): Payer: PRIVATE HEALTH INSURANCE | Admitting: Sports Medicine

## 2016-02-26 DIAGNOSIS — M2041 Other hammer toe(s) (acquired), right foot: Secondary | ICD-10-CM

## 2016-02-26 DIAGNOSIS — E1142 Type 2 diabetes mellitus with diabetic polyneuropathy: Secondary | ICD-10-CM

## 2016-02-26 DIAGNOSIS — M79674 Pain in right toe(s): Secondary | ICD-10-CM

## 2016-02-26 DIAGNOSIS — L84 Corns and callosities: Secondary | ICD-10-CM

## 2016-02-26 DIAGNOSIS — M79675 Pain in left toe(s): Secondary | ICD-10-CM

## 2016-02-26 DIAGNOSIS — Z01818 Encounter for other preprocedural examination: Secondary | ICD-10-CM

## 2016-02-26 NOTE — Progress Notes (Addendum)
Subjective: Terry Poole is a 51 y.o. male patient with history of diabetes who returns to office today complaining of painful callus  while ambulating in shoes; unable to trim. Patient states that he wants to have the toe corrected; States the glucose reading this morning was not recorded; does not check. Patient denies any new changes in medication or new problems. Patient denies any new cramping, numbness, burning or tingling in the legs.  Patient Active Problem List   Diagnosis Date Noted  . Onychomycosis 04/16/2015  . Pseudofolliculitis barbae [id number]  . Gout attack 03/28/2015   Current Outpatient Prescriptions on File Prior to Visit  Medication Sig Dispense Refill  . allopurinol (ZYLOPRIM) 300 MG tablet Take 1 tablet (300 mg total) by mouth 2 (two) times daily. 60 tablet 3  . Blood Glucose Monitoring Suppl (TRUE METRIX METER) DEVI 1 each by Does not apply route daily before breakfast. (Patient not taking: Reported on 09/21/2015) 1 Device 0  . colchicine 0.6 MG tablet Take 2 tabs (1.2mg ) at the onset of a gout attack, may repeat 1 tab (0.6mg ) after 1 hour if symptoms persist. 30 tablet 0  . cyclobenzaprine (FLEXERIL) 10 MG tablet Take 1 tablet (10 mg total) by mouth 3 (three) times daily as needed for muscle spasms. 30 tablet 0  . glucose blood (TRUE METRIX BLOOD GLUCOSE TEST) test strip Use daily before breakfast (Patient not taking: Reported on 09/21/2015) 30 each 5  . indomethacin (INDOCIN) 25 MG capsule Take 1 capsule (25 mg total) by mouth 3 (three) times daily as needed. 30 capsule 0  . methocarbamol (ROBAXIN) 500 MG tablet Take 2 tablets (1,000 mg total) by mouth 4 (four) times daily as needed (Pain). 20 tablet 0  . miconazole (LOTRIMIN AF) 2 % powder Apply topically as needed for itching. Apply to feet BID as needed. 70 g 0  . naproxen (NAPROSYN) 500 MG tablet Take 1 tablet (500 mg total) by mouth 2 (two) times daily with a meal. As needed for knee pain 30 tablet 2  . TRUEPLUS  LANCETS 28G MISC 1 each by Does not apply route daily before breakfast. (Patient not taking: Reported on 09/21/2015) 30 each 5   No current facility-administered medications on file prior to visit.    Allergies  Allergen Reactions  . Tramadol Hives  . Vicodin [Hydrocodone-Acetaminophen] Hives    No results found for this or any previous visit (from the past 2160 hour(s)).  Objective: General: Patient is awake, alert, and oriented x 3 and in no acute distress.  Integument: Skin is warm, dry and supple bilateral. Nails are short, thickened and dystrophic with subungual debris, consistent with onychomycosis, 1-5 bilateral, recently self trimmed. No signs of infection. + callus at medial right 2nd toe. Remaining integument unremarkable.  Vasculature:  Dorsalis Pedis pulse 2/4 bilateral. Posterior Tibial pulse  1/4 bilateral. Capillary fill time <3 sec 1-5 bilateral. Positive hair growth to the level of the digits.Temperature gradient within normal limits. No varicosities present bilateral. No edema present bilateral.   Neurology: The patient has diminished sensation measured with a 5.07/10g Semmes Weinstein Monofilament at all pedal sites bilateral . Vibratory sensation diminished bilateral with tuning fork. No Babinski sign present bilateral.   Musculoskeletal:Hammertoe pedal deformities noted bilateral. Muscular strength 5/5 in all lower extremity muscular groups bilateral without pain on range of motion . No tenderness with calf compression bilateral.  Assessment and Plan: Problem List Items Addressed This Visit    None    Visit Diagnoses  Pre-op exam    -  Primary   Foot callus       Hammer toe of right foot       Toe pain, bilateral       Diabetic polyneuropathy associated with type 2 diabetes mellitus ([place])          -Examined patient. -Discussed and educated patient on diabetic foot care, especially with  regards to the vascular, neurological and musculoskeletal systems.   -Stressed the importance of good glycemic control and the detriment of not controlling glucose levels in relation to the foot. -Mechanically debrided callus x1 using sterile blade -Patient opt for surgical management. Consent obtained for right 2nd hammertoe repair and excision of callus. Pre and Post op course explained. Risks, benefits, alternatives explained. No guarantees given or implied. Surgical booking slip submitted and provided patient with Surgical packet and info for [facility] day. -Dispensed surgical shoe to use post op -Recommend medical leave for 2 weeks if job allows post op shoe. If job does not allow post op shoe recommend leave for 1 month or until kwire at toe is removed -TO DO PRE-OP XRAY IN OR -Answered all patient questions -Patient to return after surgery  -Patient advised to call the office if any problems or questions arise in the meantime.  Landis Martins, DPM

## 2016-02-26 NOTE — Patient Instructions (Signed)
Pre-Operative Instructions  Congratulations, you have decided to take an important step to improving your quality of life.  You can be assured that the doctors of [facility] will be with you every step of the way.  1. Plan to be at the surgery center/hospital at least 1 (one) hour prior to your scheduled time unless otherwise directed by the surgical center/hospital staff.  You must have a responsible adult accompany you, remain during the surgery and drive you home.  Make sure you have directions to the surgical center/hospital and know how to get there on time. 2. For hospital based surgery you will need to obtain a history and physical form from your family physician within 1 month prior to the date of surgery- we will give you a form for you primary physician.  3. We make every effort to accommodate the date you request for surgery.  There are however, times where surgery dates or times have to be moved.  We will contact you as soon as possible if a change in schedule is required.   4. No Aspirin/Ibuprofen for one week before surgery.  If you are on aspirin, any non-steroidal anti-inflammatory medications (Mobic, Aleve, Ibuprofen) you should stop taking it 7 days prior to your surgery.  You make take Tylenol  For pain prior to surgery.  5. Medications- If you are taking daily heart and blood pressure medications, seizure, reflux, allergy, asthma, anxiety, pain or diabetes medications, make sure the surgery center/hospital is aware before the day of surgery so they may notify you which medications to take or avoid the day of surgery. 6. No food or drink after midnight the night before surgery unless directed otherwise by surgical center/hospital staff. 7. No alcoholic beverages 24 hours prior to surgery.  No smoking 24 hours prior to or 24 hours after surgery. 8. Wear loose pants or shorts- loose enough to fit over bandages, boots, and casts. 9. No slip on shoes, sneakers are best. 10. Bring  your boot with you to the surgery center/hospital.  Also bring crutches or a walker if your physician has prescribed it for you.  If you do not have this equipment, it will be provided for you after surgery. 11. If you have not been contracted by the surgery center/hospital by the day before your surgery, call to confirm the date and time of your surgery. 12. Leave-time from work may vary depending on the type of surgery you have.  Appropriate arrangements should be made prior to surgery with your employer. 13. Prescriptions will be provided immediately following surgery by your doctor.  Have these filled as soon as possible after surgery and take the medication as directed. 14. Remove nail polish on the operative foot. 15. Wash the night before surgery.  The night before surgery wash the foot and leg well with the antibacterial soap provided and water paying special attention to beneath the toenails and in between the toes.  Rinse thoroughly with water and dry well with a towel.  Perform this wash unless told not to do so by your physician.  Enclosed: 1 Ice pack (please put in freezer the night before surgery)   1 Hibiclens skin cleaner   Pre-op Instructions  If you have any questions regarding the instructions, do not hesitate to call our office.  [place]: [address] [place], [place] [address] [phone]  [place]: [address] [place], [place] [address] [phone]  [place]: [address]  [place], [place] [address] [phone]   Dr.   Norman Regal DPM, Dr. Matthew Wagoner DPM, Dr. M. Todd Hyatt DPM, Dr. Karyssa Amaral DPM 

## 2016-02-27 ENCOUNTER — Telehealth: Payer: Self-pay | Admitting: Family Medicine

## 2016-02-27 NOTE — Telephone Encounter (Signed)
Patient called the office to speak nurse regarding his appointment. Pt informed nurse that he needs a physical before his surgery and nurse was going to call him back with appt date. Please follow up.  Thank you.

## 2016-02-28 ENCOUNTER — Telehealth: Payer: Self-pay | Admitting: *Deleted

## 2016-02-28 NOTE — Telephone Encounter (Signed)
"  I'm at [facility] and they asked me to call and get you to fax over the paperwork you needed them to fill out for my surgery."  We gave you the paperwork.  "Oh, it's the paperwork I was supposed to show them?"  Yes, that is correct.

## 2016-02-28 NOTE — Telephone Encounter (Signed)
Writer called patient with scheduled pre op visit which is scheduled on 03/06/16.

## 2016-03-04 ENCOUNTER — Encounter (HOSPITAL_BASED_OUTPATIENT_CLINIC_OR_DEPARTMENT_OTHER): Payer: Self-pay | Admitting: *Deleted

## 2016-03-05 ENCOUNTER — Telehealth: Payer: Self-pay | Admitting: *Deleted

## 2016-03-05 NOTE — Telephone Encounter (Signed)
"  I have some paper work I need filled out by Dr. Cannon Kettle.  I'm scheduled for surgery on February 5."  I'm glad you called because we need an updated insurance card.  The insurance we have in our records is terminated.  You can bring your papers over to [place] at our [place] location and she'll fill those out for you.  "I will bring my papers and I have my new insurance card in my wallet.  I get off work in 45 minutes.  I can bring it over after I get off."  Please make sure we get the paperwork today so I can see if pre-certification is needed.  We would hate for you to have to pay out of pocket.  "I don't want that either.  I'll bring it today along with my papers."

## 2016-03-06 ENCOUNTER — Encounter: Payer: Self-pay | Admitting: Family Medicine

## 2016-03-06 ENCOUNTER — Other Ambulatory Visit: Payer: Self-pay

## 2016-03-06 ENCOUNTER — Telehealth: Payer: Self-pay | Admitting: *Deleted

## 2016-03-06 ENCOUNTER — Ambulatory Visit: Payer: PRIVATE HEALTH INSURANCE | Attending: Family Medicine | Admitting: Family Medicine

## 2016-03-06 VITALS — BP 134/89 | HR 83 | Temp 97.5°F | Ht 69.0 in | Wt 227.6 lb

## 2016-03-06 DIAGNOSIS — F17200 Nicotine dependence, unspecified, uncomplicated: Secondary | ICD-10-CM | POA: Insufficient documentation

## 2016-03-06 DIAGNOSIS — Z01810 Encounter for preprocedural cardiovascular examination: Secondary | ICD-10-CM | POA: Insufficient documentation

## 2016-03-06 DIAGNOSIS — R531 Weakness: Secondary | ICD-10-CM | POA: Insufficient documentation

## 2016-03-06 DIAGNOSIS — M2041 Other hammer toe(s) (acquired), right foot: Secondary | ICD-10-CM

## 2016-03-06 DIAGNOSIS — M1000 Idiopathic gout, unspecified site: Secondary | ICD-10-CM | POA: Diagnosis not present

## 2016-03-06 DIAGNOSIS — E119 Type 2 diabetes mellitus without complications: Secondary | ICD-10-CM | POA: Diagnosis not present

## 2016-03-06 DIAGNOSIS — Z01818 Encounter for other preprocedural examination: Secondary | ICD-10-CM | POA: Diagnosis not present

## 2016-03-06 DIAGNOSIS — Z885 Allergy status to narcotic agent status: Secondary | ICD-10-CM | POA: Insufficient documentation

## 2016-03-06 LAB — POCT GLYCOSYLATED HEMOGLOBIN (HGB A1C): HEMOGLOBIN A1C: 6.3

## 2016-03-06 LAB — GLUCOSE, POCT (MANUAL RESULT ENTRY): POC Glucose: 103 mg/dl — AB (ref 70–99)

## 2016-03-06 MED ORDER — ALLOPURINOL 300 MG PO TABS: 300.0000 mg | ORAL_TABLET | Freq: Every day | ORAL | 3 refills | Status: DC

## 2016-03-06 MED ORDER — COLCHICINE 0.6 MG PO TABS: ORAL_TABLET | ORAL | 1 refills | Status: DC

## 2016-03-06 MED FILL — COLCHICINE 0.6 MG TABLET: 0.6 | 15 days supply | Qty: 30 | Fill #0

## 2016-03-06 MED FILL — ALLOPURINOL 300 MG TABLET: 300 | 30 days supply | Qty: 30 | Fill #0

## 2016-03-06 NOTE — Telephone Encounter (Signed)
"  I called my insurance company and found out I do not have any insurance.  They said I would not be able to re-enroll until November.  Apparently I was out the day they were enrolling for insurance on my job.  So, I am going to have to cancel my surgery.  I need to have it done but I can't afford it.  I just paid $25 to you all for my FMLA.  How do I go about getting that back?"  I will cancel the surgery.  Terry Poole said that we would reimburse you but it has to come from [facility].  She said she would take care of it and you should receive the refund in the mail.  "How do I get my papers back that I brought over, do I go to the front desk?"  Yes, you can go to the front desk to get them.   "Don't cancel my surgery!  [employer] said that they could get my surgery taken care of."  How will they go about doing that?  "I will bring the paperwork over to you all as soon as they finish filling it out."

## 2016-03-06 NOTE — Progress Notes (Signed)
Subjective:  Patient ID: Terry Poole, male    DOB: 05-24-1965  Age: 51 y.o. MRN: [id number]  CC: Diabetes and Gout   HPI Terry Poole is a 51 year old male with history of type 2 diabetes mellitus (diet controlled A1c of 6.3), gout Who comes in for preoperative clearance as he is scheduled for right second hammer toe correction on 03/10/2016.  He denies recent gout flares and has been compliant with his medication and is needing refills. He has no other concerns today.  Past Medical History:  Diagnosis Date  . Bronchitis   . Bronchitis   . Gout   . Hammertoe of second toe of right foot   . Smoker     Past Surgical History:  Procedure Laterality Date  . NO PAST SURGERIES      History reviewed. No pertinent family history.  Allergies  Allergen Reactions  . Tramadol Hives  . Vicodin [Hydrocodone-Acetaminophen] Hives     Outpatient Medications Prior to Visit  Medication Sig Dispense Refill  . indomethacin (INDOCIN) 25 MG capsule Take 1 capsule (25 mg total) by mouth 3 (three) times daily as needed. (Patient not taking: Reported on 03/06/2016) 30 capsule 0  . miconazole (LOTRIMIN AF) 2 % powder Apply topically as needed for itching. Apply to feet BID as needed. (Patient not taking: Reported on 03/06/2016) 70 g 0  . allopurinol (ZYLOPRIM) 300 MG tablet Take 1 tablet (300 mg total) by mouth 2 (two) times daily. (Patient not taking: Reported on 03/06/2016) 60 tablet 3  . colchicine 0.6 MG tablet Take 2 tabs (1.2mg ) at the onset of a gout attack, may repeat 1 tab (0.6mg ) after 1 hour if symptoms persist. (Patient not taking: Reported on 03/06/2016) 30 tablet 0   No facility-administered medications prior to visit.     ROS Review of Systems  Constitutional: Negative for activity change and appetite change.  HENT: Negative for sinus pressure and sore throat.   Eyes: Negative for visual disturbance.  Respiratory: Negative for cough, chest tightness and shortness of breath.     Cardiovascular: Negative for chest pain and leg swelling.  Gastrointestinal: Negative for abdominal distention, abdominal pain, constipation and diarrhea.  Endocrine: Negative.   Genitourinary: Negative for dysuria.  Musculoskeletal:       See hpi  Skin: Negative for rash.  Allergic/Immunologic: Negative.   Neurological: Negative for weakness, light-headedness and numbness.  Psychiatric/Behavioral: Negative for dysphoric mood and suicidal ideas.    Objective:  BP 134/89 (BP Location: Right Arm, Patient Position: Sitting, Cuff Size: Large)   Pulse 83   Temp 97.5 F (36.4 C) (Oral)   Ht 5\' 9"  (1.753 m)   Wt 227 lb 9.6 oz (103.2 kg)   SpO2 99%   BMI 33.61 kg/m   BP/Weight 03/06/2016 03/04/2016 [id number]  Systolic BP [id number] - [id number]  Diastolic BP 89 - 93  Wt. (Lbs) 227.6 225 224.13  BMI 33.61 - 33.1      Physical Exam  Constitutional: He is oriented to person, place, and time. He appears well-developed and well-nourished.  Cardiovascular: Normal rate, normal heart sounds and intact distal pulses.   No murmur heard. Pulmonary/Chest: Effort normal and breath sounds normal. He has no wheezes. He has no rales. He exhibits no tenderness.  Abdominal: Soft. Bowel sounds are normal. He exhibits no distension and no mass. There is no tenderness.  Musculoskeletal:  Hammer toe on right 2nd toe  Neurological: He is alert and oriented to person, place, and time.  Lab Results  Component Value Date   HGBA1C 6.3 03/06/2016    Assessment & Plan:   1. Diabetes mellitus without complication ([place]) Diet controlled with A1c of 6.3 - Glucose (CBG) - HgB A1c  2. Acute idiopathic gout, unspecified site Stable - allopurinol (ZYLOPRIM) 300 MG tablet; Take 1 tablet (300 mg total) by mouth daily.  Dispense: 30 tablet; Refill: 3 - colchicine 0.6 MG tablet; Take 2 tabs (1.2mg ) at the onset of a gout attack, may repeat 1 tab (0.6mg ) after 1 hour if symptoms persist.  Dispense: 30 tablet; Refill:  1  3. Hammertoe of second toe of right foot Medically optimized for moderate weakness, procedure Completed preop clearance form   Meds ordered this encounter  Medications  . allopurinol (ZYLOPRIM) 300 MG tablet    Sig: Take 1 tablet (300 mg total) by mouth daily.    Dispense:  30 tablet    Refill:  3  . colchicine 0.6 MG tablet    Sig: Take 2 tabs (1.2mg ) at the onset of a gout attack, may repeat 1 tab (0.6mg ) after 1 hour if symptoms persist.    Dispense:  30 tablet    Refill:  1    Follow-up: Return in about 3 months (around 06/03/2016) for complete physical exam.   Arnoldo Morale MD

## 2016-03-07 ENCOUNTER — Telehealth: Payer: Self-pay | Admitting: *Deleted

## 2016-03-07 ENCOUNTER — Ambulatory Visit: Payer: Self-pay | Attending: Family Medicine

## 2016-03-07 NOTE — Telephone Encounter (Signed)
"  I'm calling to see what insurance you have for [employer].  He's scheduled for surgery on Monday with Dr. Cannon Kettle."  He does not have insurance at this time.  He's trying to work something out with [place].  "Okay, I put him down as self pay until we hear different."

## 2016-03-07 NOTE — Telephone Encounter (Signed)
"  I was scheduled for surgery February 5.  I been having some issues with my insurance.  I'm here at [facility] now.  The store manager gave me a 1-800 number to call.  I been trying to call these people to figure out why they have been taking money out my check and I don't have any insurance.  Please call me."

## 2016-03-07 NOTE — Telephone Encounter (Signed)
"  I just got a letter from [place] and they said my surgery would be covered at 100%.  So, I'm getting ready to bring the letter to you now."  Make sure you bring it over in the next 15 minutes because we close early on Fridays.  "I should be there soon."  I called and got surgery back on the schedule for Monday.  I spoke to [employer]

## 2016-03-07 NOTE — Telephone Encounter (Signed)
I called and canceled patient's surgery for February 5 at [facility].  Patient does not have insurance.

## 2016-03-07 NOTE — Telephone Encounter (Signed)
Patient brought letter from [place].   His services are covered at 100% until 09/03/2016.  Patient applied for financial assistance from [facility] and his application was approved.  Account number is [id number].   I called [facility] and informed Mary.  She stated she would put a note on the schedule.

## 2016-03-10 ENCOUNTER — Ambulatory Visit (HOSPITAL_BASED_OUTPATIENT_CLINIC_OR_DEPARTMENT_OTHER)
Admission: RE | Admit: 2016-03-10 | Discharge: 2016-03-10 | Disposition: A | Discharge status: Home / Self Care | Payer: Self-pay | Source: Ambulatory Visit | Attending: Sports Medicine | Admitting: Sports Medicine

## 2016-03-10 ENCOUNTER — Encounter (HOSPITAL_BASED_OUTPATIENT_CLINIC_OR_DEPARTMENT_OTHER)
Admission: RE | Disposition: A | Discharge status: Home / Self Care | Payer: Self-pay | Source: Ambulatory Visit | Attending: Sports Medicine

## 2016-03-10 ENCOUNTER — Ambulatory Visit (HOSPITAL_BASED_OUTPATIENT_CLINIC_OR_DEPARTMENT_OTHER): Payer: Self-pay | Admitting: Anesthesiology

## 2016-03-10 ENCOUNTER — Other Ambulatory Visit: Payer: Self-pay | Admitting: Sports Medicine

## 2016-03-10 ENCOUNTER — Encounter: Payer: Self-pay | Admitting: Sports Medicine

## 2016-03-10 ENCOUNTER — Encounter (HOSPITAL_BASED_OUTPATIENT_CLINIC_OR_DEPARTMENT_OTHER): Payer: Self-pay | Admitting: Anesthesiology

## 2016-03-10 DIAGNOSIS — M2040 Other hammer toe(s) (acquired), unspecified foot: Secondary | ICD-10-CM

## 2016-03-10 DIAGNOSIS — L84 Corns and callosities: Secondary | ICD-10-CM

## 2016-03-10 DIAGNOSIS — F17210 Nicotine dependence, cigarettes, uncomplicated: Secondary | ICD-10-CM | POA: Insufficient documentation

## 2016-03-10 DIAGNOSIS — M2041 Other hammer toe(s) (acquired), right foot: Secondary | ICD-10-CM | POA: Insufficient documentation

## 2016-03-10 DIAGNOSIS — E119 Type 2 diabetes mellitus without complications: Secondary | ICD-10-CM | POA: Insufficient documentation

## 2016-03-10 HISTORY — PX: HAMMER TOE SURGERY: SHX385

## 2016-03-10 HISTORY — DX: Nicotine dependence, unspecified, uncomplicated: F17.200

## 2016-03-10 HISTORY — DX: Other hammer toe(s) (acquired), right foot: M20.41

## 2016-03-10 HISTORY — PX: MASS EXCISION: SHX2000

## 2016-03-10 SURGERY — CORRECTION, HAMMER TOE
Anesthesia: Monitor Anesthesia Care | Site: Toe | Laterality: Right

## 2016-03-10 SURGERY — CORRECTION, HAMMER TOE
Anesthesia: Monitor Anesthesia Care | Laterality: Right

## 2016-03-10 MED ORDER — LACTATED RINGERS IV SOLN
INTRAVENOUS | Status: DC
  Administered 2016-03-10: 10 mL/h via INTRAVENOUS
  Administered 2016-03-10: 13:00:00 via INTRAVENOUS

## 2016-03-10 MED ORDER — PROMETHAZINE HCL 12.5 MG PO TABS: 12.5000 mg | ORAL_TABLET | Freq: Four times a day (QID) | ORAL | 0 refills | Status: DC | PRN

## 2016-03-10 MED ORDER — OXYCODONE HCL 5 MG PO TABS: 5.0000 mg | ORAL_TABLET | Freq: Once | ORAL | Status: DC | PRN

## 2016-03-10 MED ORDER — CEFAZOLIN SODIUM-DEXTROSE 2-4 GM/100ML-% IV SOLN
INTRAVENOUS | Status: AC
  Filled 2016-03-10: qty 100

## 2016-03-10 MED ORDER — MIDAZOLAM HCL 2 MG/2ML IJ SOLN
INTRAMUSCULAR | Status: AC
  Filled 2016-03-10: qty 2

## 2016-03-10 MED ORDER — FENTANYL CITRATE (PF) 100 MCG/2ML IJ SOLN: 25.0000 ug | INTRAMUSCULAR | Status: DC | PRN

## 2016-03-10 MED ORDER — LACTATED RINGERS IV SOLN: INTRAVENOUS | Status: DC

## 2016-03-10 MED ORDER — PROMETHAZINE HCL 25 MG/ML IJ SOLN: 6.2500 mg | INTRAMUSCULAR | Status: DC | PRN

## 2016-03-10 MED ORDER — MIDAZOLAM HCL 5 MG/5ML IJ SOLN
INTRAMUSCULAR | Status: DC | PRN
  Administered 2016-03-10: 2 mg via INTRAVENOUS

## 2016-03-10 MED ORDER — SCOPOLAMINE 1 MG/3DAYS TD PT72: 1.0000 | MEDICATED_PATCH | Freq: Once | TRANSDERMAL | Status: DC | PRN

## 2016-03-10 MED ORDER — BUPIVACAINE HCL 0.5 % IJ SOLN
INTRAMUSCULAR | Status: DC | PRN
  Administered 2016-03-10: 10 mL via INTRAMUSCULAR

## 2016-03-10 MED ORDER — PROPOFOL 10 MG/ML IV BOLUS
INTRAVENOUS | Status: DC | PRN
  Administered 2016-03-10: 20 mg via INTRAVENOUS

## 2016-03-10 MED ORDER — OXYCODONE HCL 5 MG/5ML PO SOLN: 5.0000 mg | Freq: Once | ORAL | Status: DC | PRN

## 2016-03-10 MED ORDER — DOCUSATE SODIUM 100 MG PO CAPS: 100.0000 mg | ORAL_CAPSULE | Freq: Two times a day (BID) | ORAL | 0 refills | Status: DC

## 2016-03-10 MED ORDER — CEFAZOLIN SODIUM-DEXTROSE 2-3 GM-% IV SOLR
INTRAVENOUS | Status: DC | PRN
  Administered 2016-03-10: 2 g via INTRAVENOUS

## 2016-03-10 MED ORDER — MEPERIDINE HCL 25 MG/ML IJ SOLN: 6.2500 mg | INTRAMUSCULAR | Status: DC | PRN

## 2016-03-10 MED ORDER — OXYCODONE-ACETAMINOPHEN 5-325 MG PO TABS: 1.0000 | ORAL_TABLET | Freq: Four times a day (QID) | ORAL | 0 refills | Status: AC | PRN

## 2016-03-10 MED ORDER — PROPOFOL 500 MG/50ML IV EMUL
INTRAVENOUS | Status: DC | PRN
  Administered 2016-03-10: 100 ug/kg/min via INTRAVENOUS

## 2016-03-10 MED ORDER — MIDAZOLAM HCL 2 MG/2ML IJ SOLN: 1.0000 mg | INTRAMUSCULAR | Status: DC | PRN

## 2016-03-10 MED ORDER — FENTANYL CITRATE (PF) 100 MCG/2ML IJ SOLN
INTRAMUSCULAR | Status: AC
  Filled 2016-03-10: qty 2

## 2016-03-10 MED ORDER — FENTANYL CITRATE (PF) 100 MCG/2ML IJ SOLN
50.0000 ug | INTRAMUSCULAR | Status: DC | PRN
  Administered 2016-03-10: 100 ug via INTRAVENOUS

## 2016-03-10 SURGICAL SUPPLY — 56 items
BANDAGE ACE 3X5.8 VEL STRL LF (GAUZE/BANDAGES/DRESSINGS) ×2 IMPLANT
BLADE OSC/SAG .038X5.5 CUT EDG (BLADE) ×2 IMPLANT
BLADE SURG 15 STRL LF DISP TIS (BLADE) ×2 IMPLANT
BLADE SURG 15 STRL SS (BLADE) ×2
BNDG COHESIVE 3X5 TAN STRL LF (GAUZE/BANDAGES/DRESSINGS) IMPLANT
BNDG CONFORM 2 STRL LF (GAUZE/BANDAGES/DRESSINGS) ×2 IMPLANT
BNDG ESMARK 4X9 LF (GAUZE/BANDAGES/DRESSINGS) ×2 IMPLANT
BNDG GAUZE ELAST 4 BULKY (GAUZE/BANDAGES/DRESSINGS) ×2 IMPLANT
CAP PIN PROTECTOR ORTHO WHT (CAP) ×2 IMPLANT
COVER BACK TABLE 60X90IN (DRAPES) ×2 IMPLANT
CUFF TOURNIQUET SINGLE 18IN (TOURNIQUET CUFF) ×2 IMPLANT
DRAPE EXTREMITY T 121X128X90 (DRAPE) ×2 IMPLANT
DRAPE IMP U-DRAPE 54X76 (DRAPES) ×2 IMPLANT
DRAPE OEC MINIVIEW 54X84 (DRAPES) ×2 IMPLANT
DRAPE U-SHAPE 47X51 STRL (DRAPES) IMPLANT
DRSG EMULSION OIL 3X3 NADH (GAUZE/BANDAGES/DRESSINGS) IMPLANT
DRSG PAD ABDOMINAL 8X10 ST (GAUZE/BANDAGES/DRESSINGS) IMPLANT
DURAPREP 26ML APPLICATOR (WOUND CARE) ×2 IMPLANT
ELECT REM PT RETURN 9FT ADLT (ELECTROSURGICAL) ×2
ELECTRODE REM PT RTRN 9FT ADLT (ELECTROSURGICAL) ×1 IMPLANT
GAUZE SPONGE 4X4 12PLY STRL (GAUZE/BANDAGES/DRESSINGS) ×2 IMPLANT
GAUZE SPONGE 4X4 16PLY XRAY LF (GAUZE/BANDAGES/DRESSINGS) IMPLANT
GAUZE XEROFORM 1X8 LF (GAUZE/BANDAGES/DRESSINGS) ×2 IMPLANT
GLOVE BIO SURGEON STRL SZ 6.5 (GLOVE) ×4 IMPLANT
GLOVE BIOGEL PI IND STRL 6.5 (GLOVE) ×1 IMPLANT
GLOVE BIOGEL PI IND STRL 7.0 (GLOVE) ×1 IMPLANT
GLOVE BIOGEL PI INDICATOR 6.5 (GLOVE) ×1
GLOVE BIOGEL PI INDICATOR 7.0 (GLOVE) ×1
GLOVE EXAM NITRILE EXT CUFF MD (GLOVE) ×2 IMPLANT
GOWN STRL REUS W/ TWL LRG LVL3 (GOWN DISPOSABLE) ×2 IMPLANT
GOWN STRL REUS W/TWL LRG LVL3 (GOWN DISPOSABLE) ×2
K-WIRE .045X4 (WIRE) ×2 IMPLANT
NDL SAFETY ECLIPSE 18X1.5 (NEEDLE) ×1 IMPLANT
NEEDLE HYPO 18GX1.5 SHARP (NEEDLE) ×1
NEEDLE HYPO 25X1 1.5 SAFETY (NEEDLE) ×4 IMPLANT
NS IRRIG 1000ML POUR BTL (IV SOLUTION) ×2 IMPLANT
PACK BASIN DAY SURGERY FS (CUSTOM PROCEDURE TRAY) ×2 IMPLANT
PADDING CAST ABS 4INX4YD NS (CAST SUPPLIES) ×1
PADDING CAST ABS COTTON 4X4 ST (CAST SUPPLIES) ×1 IMPLANT
PENCIL BUTTON HOLSTER BLD 10FT (ELECTRODE) ×2 IMPLANT
SLEEVE SCD COMPRESS KNEE MED (MISCELLANEOUS) ×2 IMPLANT
STOCKINETTE 6  STRL (DRAPES) ×1
STOCKINETTE 6 STRL (DRAPES) ×1 IMPLANT
STOCKINETTE SYNTHETIC 4 NONSTR (MISCELLANEOUS) ×2 IMPLANT
STRIP SUTURE WOUND CLOSURE 1/2 (SUTURE) IMPLANT
SUT ETHILON 3 0 PS 1 (SUTURE) IMPLANT
SUT ETHILON 4 0 PS 2 18 (SUTURE) ×4 IMPLANT
SUT MERSILENE 2.0 SH NDLE (SUTURE) IMPLANT
SUT MNCRL AB 3-0 PS2 18 (SUTURE) IMPLANT
SUT MNCRL AB 4-0 PS2 18 (SUTURE) IMPLANT
SUT MON AB 5-0 PS2 18 (SUTURE) IMPLANT
SUT VIC AB 3-0 FS2 27 (SUTURE) ×2 IMPLANT
SYR 10ML LL (SYRINGE) ×4 IMPLANT
SYR BULB 3OZ (MISCELLANEOUS) ×2 IMPLANT
TOWEL OR 17X24 6PK STRL BLUE (TOWEL DISPOSABLE) ×2 IMPLANT
UNDERPAD 30X30 (UNDERPADS AND DIAPERS) ×2 IMPLANT

## 2016-03-10 NOTE — Anesthesia Preprocedure Evaluation (Addendum)
Anesthesia Evaluation  Patient identified by MRN, date of birth, ID band Patient awake    Reviewed: Allergy & Precautions, NPO status , Patient's Chart, lab work & pertinent test results  Airway Mallampati: II  TM Distance: >3 FB Neck ROM: Full    Dental  (+) Teeth Intact, Dental Advisory Given   Pulmonary Current Smoker,    breath sounds clear to auscultation       Cardiovascular negative cardio ROS   Rhythm:Regular Rate:Normal     Neuro/Psych negative neurological ROS  negative psych ROS   GI/Hepatic negative GI ROS, Neg liver ROS,   Endo/Other  negative endocrine ROS  Renal/GU negative Renal ROS  negative genitourinary   Musculoskeletal negative musculoskeletal ROS (+)   Abdominal   Peds negative pediatric ROS (+)  Hematology negative hematology ROS (+)   Anesthesia Other Findings   Reproductive/Obstetrics negative OB ROS                            EKG: normal sinus rhythm.  Anesthesia Physical Anesthesia Plan  ASA: II  Anesthesia Plan: MAC   Post-op Pain Management:    Induction: Intravenous  Airway Management Planned: Natural Airway  Additional Equipment:   Intra-op Plan:   Post-operative Plan:   Informed Consent: I have reviewed the patients History and Physical, chart, labs and discussed the procedure including the risks, benefits and alternatives for the proposed anesthesia with the patient or authorized representative who has indicated his/her understanding and acceptance.   Dental advisory given  Plan Discussed with: CRNA  Anesthesia Plan Comments:         Anesthesia Quick Evaluation

## 2016-03-10 NOTE — Transfer of Care (Signed)
Immediate Anesthesia Transfer of Care Note  Patient: Terry Poole  Procedure(s) Performed: Procedure(s): 2ND RIGHT HAMMER TOE CORRECTION (Right) EXCISION OF CALLUS 2ND RIGHT TOE (Right)  Patient Location: PACU  Anesthesia Type:MAC  Level of Consciousness: awake, alert , oriented and patient cooperative  Airway & Oxygen Therapy: Patient Spontanous Breathing and Patient connected to face mask oxygen  Post-op Assessment: Report given to RN and Post -op Vital signs reviewed and stable  Post vital signs: Reviewed and stable  Last Vitals:  Vitals:   03/10/16 1235  BP: (!) 138/91  Pulse: 88  Resp: 18  Temp: 36.7 C    Last Pain:  Vitals:   03/10/16 1235  TempSrc: Oral         Complications: No apparent anesthesia complications

## 2016-03-10 NOTE — H&P (Signed)
H&P: Podiatry   DPO:EUMP Harpham 51 y.o. male  patient with a history of painful callus at right 2nd toe and hammertoe seen on several occassions in office complaining of pain; Patient has tried multiple conservative treatments and opted for Surgical management. Patient had pre-op physical and clearance for right 2nd hammertoe repair with excision of callus completed. Patient met this AM in pre-op holding area; informed consent reviewed and signed. All questions answered. Right foot/surgical site marked.  This am patient denies any other pedal complaints. Denies Nausea/fever/vomiting/chills/night sweats/overnight events. Confirms NPO since midnight.   Patient Active Problem List   Diagnosis Date Noted  . Hammertoe of second toe of right foot 03/06/2016  . Onychomycosis 04/16/2015  . 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 Gout attack 03/28/2015    No current facility-administered medications on file prior to encounter.    Current Outpatient Prescriptions on File Prior to Encounter  Medication Sig Dispense Refill  . indomethacin (INDOCIN) 25 MG capsule Take 1 capsule (25 mg total) by mouth 3 (three) times daily as needed. (Patient not taking: Reported on 03/06/2016) 30 capsule 0  . miconazole (LOTRIMIN AF) 2 % powder Apply topically as needed for itching. Apply to feet BID as needed. (Patient not taking: Reported on 03/06/2016) 70 g 0     Allergies  Allergen Reactions  . Tramadol Hives  . Vicodin [Hydrocodone-Acetaminophen] Hives    Social History   Social History  . Marital status: Single    Spouse name: N/A  . Number of children: N/A  . Years of education: N/A   Occupational History  . Not on file.   Social History Main Topics  . Smoking status: Current Every Day Smoker    Packs/day: 0.50    Types: Cigarettes  . Smokeless tobacco: Never Used  . Alcohol use No  . Drug use: No  . Sexual activity: Not on file   Other Topics Concern  . Not on file   Social History  Narrative  . No narrative on file    Past Surgical History:  Procedure Laterality Date  . NO PAST SURGERIES      History reviewed. No pertinent family history.   REVIEW OF SYSTEMS: Neurologic: Denies vertigo, syncope, convulsions or  headaches. Musculoskeletal: No muscle or joint pain. Cardiorespiratory:  Denies shortness of breath, dyspnea on exertion, chest pain, cough or  hemoptysis. Gastrointestinal: Denies loose stool, Denies emesis, melena, constipationor rectal  bleeding. Genitourinary: No difficulty with voiding noted.  PHYSICAL EXAMINATION:  Today's Vitals   03/04/16 1207 03/10/16 1235  BP:  (!) 138/91  Pulse:  88  Resp:  18  Temp:  98.1 F (36.7 C)  TempSrc:  Oral  SpO2:  100%  Weight: 225 lb (102.1 kg) 223 lb (101.2 kg)  Height: '5\' 9"'  (1.753 m) '5\' 9"'  (1.753 m)    GENERAL: Well-developed, well-nourished, in no acute distress. Alert  and cooperative.  LOWER EXTREMITY EXAM: DERMATOLOGY: Skin warm and supple bilateral, no open lesions, nails within normal limits. VASCULAR: Palpable pedal pulses bilateral, Temperature gradient within normal limits, Capillary fill time 3 seconds, positive pedal hair growth present bilateral. NEUROLOGY: Gross sensation present with light touch bilateral MUSCULOSKELETAL: +foot deformity/ pain with palpation to area of concernat right 2nd toe   XRAY, RIGHT FOOT- Hammertoe   ASSESSMENTS:  1.Right 2nd hammertoe with painful callus 2. Controlled Diabetes 3. Current smoker  PLAN OF CARE: Patient seen and evaluated 1. History and physical completed 2. Patient NPO since midnight 3. Previous Imaging reviewed  4. Consent for surgery explained and obtained for right 2nd hammertoe repair with excision of callus, risk and benefits explained; all questions answered and no guarantees granted. 5. Patient to undergo above surgical procedure 6. Case discussed with patient and to meet with family represented after the  procedure 7. To resume all home meds post-procedure and to give prn pain meds, anti-nausea, and anti-constipation medications post op 8. Will continue to follow closely/ see post-operative in the office within 1 week.  Landis Martins, DPM

## 2016-03-10 NOTE — Addendum Note (Signed)
Addendum  created 03/10/16 1927 by Willa Frater, CRNA   Anesthesia Intra Meds edited

## 2016-03-10 NOTE — Discharge Instructions (Signed)

## 2016-03-10 NOTE — Progress Notes (Signed)
Resent post op meds to [facility] in [employer] on [address]

## 2016-03-10 NOTE — Brief Op Note (Signed)
03/10/2016  2:08 PM  PATIENT:  Terry Poole  51 y.o. male  PRE-OPERATIVE DIAGNOSIS:  HAMMER TOE  POST-OPERATIVE DIAGNOSIS:  HAMMER TOE WITH CALLUS  PROCEDURE:  Procedure(s): 2ND RIGHT HAMMER TOE CORRECTION (Right) EXCISION OF CALLUS 2ND RIGHT TOE (Right)  SURGEON:  Surgeon(s) and Role:    * Landis Martins, DPM - Primary  PHYSICIAN ASSISTANT:   ASSISTANTS: none   ANESTHESIA:   MAC with local   EBL:  Total I/O In: 700 [I.V.:700] Out: -   BLOOD ADMINISTERED:none  DRAINS: none   LOCAL MEDICATIONS USED:  MARCAINE   And LIDOCAINE 10cc total   SPECIMEN:  No Specimen  DISPOSITION OF SPECIMEN:  N/A  COUNTS:  YES  TOURNIQUET:   Total Tourniquet Time Documented: Calf (Right) - 34 minutes Total: Calf (Right) - 34 minutes   DICTATION: .Note written in EPIC  PLAN OF CARE: Discharge to home after PACU  PATIENT DISPOSITION:  PACU - hemodynamically stable.   Delay start of Pharmacological VTE agent (>24hrs) due to surgical blood loss or risk of bleeding: not applicable

## 2016-03-10 NOTE — Op Note (Signed)
DATE OF OPERATION: 03-10-16  PREOPERATIVE DIAGNOSES:  1. Hammertoe, Right 2nd toe  2. Right 2nd toe callus medial aspect   POSTOPERATIVE DIAGNOSIS:  1. Management of 2nd hammertoe, right foot  2. Management of 2nd toe callus, right foot   OPERATION PERFORMED:  1.Right 2nd toe arthrodesis/hammertoe repair with 0.45 kwire 2.Excision of callus Right 2nd toe    SURGEON: Landis Martins, DPM   ASSISTANT: None  ESTIMATED BLOOD LOSS:  Minimal.   HEMOSTASIS:  Ankle tourniquet x 34 mins   INJECTABLES:  10 mL of 1% lidocaine plain and 0.5% Marcaine plain.   INDICATIONS FOR OPERATION:  The patient is a [name] with clinical and radiographic signs and symptoms consistent with the above-stated diagnosis.The patient has chosen surgical correction of the diagnosis and has consented for surgery. All risks, benefits and complications have been explained to the patient including but not limited to recurrence of the deformity, dehiscence of incision site and the need for further surgery. The patient understands. No guarantees were made or implied to the outcome of the surgery.   DESCRIPTION OF OPERATION:  The patient was brought to the operating room and positioned on OR table in the supine position. Following appropriate padding of all bony areas, local anesthesia was administered using injectable as above. Following the foot was then prepped and draped in sterile fashion. Tourniquet was inflated and attention was directed to right 2nd toe where callus at medial aspect of toe was excised in total. Then attention was directed to dorsal 2nd toe where linear incision was made and deepened down through the layers of subcutaneous tissues using sharp and blunt dissection with all venous tributaries being isolated. The extensor was identified and transected with a release of the deformity noted and the 2nd toe sitting in a more rectus position however there was spurring at PIPJ so using a sagittal saw the  head of the proximal phalanx and the base of middle phalanx was resected. Then area was flushed with saline and then stabilizing kwire was placed and confirmed in good position using fluoroscopy. The subcutaneous tissue and extensor tendon was approximated using 3-0 vicryl and the skin was re-coapted utilizing 4-0 nylon in a simple interrupted suture fashion. A dry sterile dressing was applied and the tourniquet was rapidly deflated and prompt instantaneous hyperemic response was noted to all aspects of the patient's right lower extremity with digital capillary refill time measuring less than 3 seconds to digits 1-5 of the right foot.  The patient tolerated the procedure and local anesthesia well and was transported from the operating room to the recovery area with vital signs stable and vascular status intact to all aspects of the patient's right lower extremity.   Following a period of postoperative monitoring, the patient was discharged home with oral and written instructions and prescriptions per Dr. Cannon Kettle.  The patient will remain weightbearing with post op shoe and was informed to keep dressing clean dry and intact and to follow up in office in 1 week for continued post op care.  Landis Martins, DPM

## 2016-03-10 NOTE — Anesthesia Postprocedure Evaluation (Addendum)
Anesthesia Post Note  Patient: Yazir Koerber  Procedure(s) Performed: Procedure(s) (LRB): 2ND RIGHT HAMMER TOE CORRECTION (Right) EXCISION OF CALLUS 2ND RIGHT TOE (Right)  Patient location during evaluation: PACU Anesthesia Type: MAC Level of consciousness: awake and alert Pain management: pain level controlled Vital Signs Assessment: post-procedure vital signs reviewed and stable Respiratory status: spontaneous breathing, nonlabored ventilation, respiratory function stable and patient connected to nasal cannula oxygen Cardiovascular status: stable and blood pressure returned to baseline Anesthetic complications: no       Last Vitals:  Vitals:   03/10/16 1430 03/10/16 1445  BP: 130/90 (!) 115/99  Pulse: 76 78  Resp: 17 16  Temp:      Last Pain:  Vitals:   03/10/16 1430  TempSrc:   PainSc: 0-No pain                 Effie Berkshire

## 2016-03-11 ENCOUNTER — Encounter (HOSPITAL_BASED_OUTPATIENT_CLINIC_OR_DEPARTMENT_OTHER): Payer: Self-pay | Admitting: Sports Medicine

## 2016-03-11 ENCOUNTER — Telehealth: Payer: Self-pay | Admitting: Sports Medicine

## 2016-03-11 NOTE — Telephone Encounter (Signed)
Post op check phone call. Patient reports that he is doing good. Denies any overnight issues. I reminded patient to keep dressing clean, dry, and intact, no excessive standing or walking and that he should be sitting with foot elevated and resting only getting up to go to bathroom and to get food. Patient expressed understanding. Patient to continue with medications as needed and is scheduled to follow up next week for continued post op care. -Dr. Cannon Kettle

## 2016-03-12 NOTE — Progress Notes (Signed)
DOS 02.05.2018 Remove corn and fix hammertoe at right 2nd toe with possible kwire.

## 2016-03-18 ENCOUNTER — Ambulatory Visit: Payer: No Typology Code available for payment source

## 2016-03-18 ENCOUNTER — Encounter: Payer: Self-pay | Admitting: Sports Medicine

## 2016-03-18 ENCOUNTER — Ambulatory Visit (INDEPENDENT_AMBULATORY_CARE_PROVIDER_SITE_OTHER): Payer: Self-pay | Admitting: Sports Medicine

## 2016-03-18 DIAGNOSIS — Z9889 Other specified postprocedural states: Secondary | ICD-10-CM

## 2016-03-18 DIAGNOSIS — M2041 Other hammer toe(s) (acquired), right foot: Secondary | ICD-10-CM

## 2016-03-18 DIAGNOSIS — M2040 Other hammer toe(s) (acquired), unspecified foot: Secondary | ICD-10-CM

## 2016-03-18 MED ORDER — OXYCODONE-ACETAMINOPHEN 7.5-325 MG PO TABS
1.0000 | ORAL_TABLET | Freq: Four times a day (QID) | ORAL | 0 refills | Status: DC | PRN
Start: 2016-03-18 — End: 2016-03-25

## 2016-03-18 NOTE — Progress Notes (Signed)
Subjective: Terry Poole is a 51 y.o. male patient seen today in office for POV #1  (DOS 03-10-16), S/P right excision of lesion and 2nd hammertoe repair. Patient denies pain at surgical site, denies calf pain, denies headache, chest pain, shortness of breath, nausea, vomiting, fever, or chills. Patient states that he is doing well and is finished with pain pills however would like to have more for pain at end of day. States his dressing came off this morning and presented to office with sock and post op shoe on right foot using crutch to help with ambulation. No other issues noted.   Patient Active Problem List   Diagnosis Date Noted  . Hammertoe of second toe of right foot 03/06/2016  . Onychomycosis 04/16/2015  . Pseudofolliculitis barbae [id number]  . Gout attack 03/28/2015    Current Outpatient Prescriptions on File Prior to Visit  Medication Sig Dispense Refill  . allopurinol (ZYLOPRIM) 300 MG tablet Take 1 tablet (300 mg total) by mouth daily. 30 tablet 3  . colchicine 0.6 MG tablet Take 2 tabs (1.2mg ) at the onset of a gout attack, may repeat 1 tab (0.6mg ) after 1 hour if symptoms persist. 30 tablet 1  . docusate sodium (COLACE) 100 MG capsule Take 1 capsule (100 mg total) by mouth 2 (two) times daily. 10 capsule 0  . miconazole (LOTRIMIN AF) 2 % powder Apply topically as needed for itching. Apply to feet BID as needed. (Patient not taking: Reported on 03/06/2016) 70 g 0  . promethazine (PHENERGAN) 12.5 MG tablet Take 1 tablet (12.5 mg total) by mouth every 6 (six) hours as needed for nausea or vomiting. 30 tablet 0   No current facility-administered medications on file prior to visit.     Allergies  Allergen Reactions  . Tramadol Hives  . Vicodin [Hydrocodone-Acetaminophen] Hives    Objective: There were no vitals filed for this visit.  General: No acute distress, AAOx3  Righ foot: Kwire intact but missing cap at 2nd toe, Sutures intact to 2nd toe with mild gapping or  dehiscence centrally at surgical site, moderate swelling to right 2nd toe, no erythema, no warmth, no drainage, no signs of infection noted, Capillary fill time <3 seconds in all digits, gross sensation present via light touch to right foot. No pain or crepitation with range of motion right foot.  No pain with calf compression.   Post Op Xray, Right foot: Kwire Excellent alignment and position. Arthrodesis site healing. Soft tissue swelling within normal limits for post op status.   Assessment and Plan:  Problem List Items Addressed This Visit    None    Visit Diagnoses    Hammer toe of right foot    -  Primary   Relevant Orders   DG Foot Complete Right   S/P foot surgery, right           -Patient seen and evaluated -xrays reviewed  -Applied dry sterile dressing to surgical site right/ foot secured with ACE wrap and stockinet  -Advised patient to make sure to keep dressings clean, dry, and intact to right surgical site, removing the ACE as needed  -Advised patient to continue with post-op shoe on right foot with assistance of crutches  -Advised patient to limit activity to necessity  -Advised patient to ice and elevate as necessary -Refilled percocet to take as needed for pain  -Will plan for suture removal at next office visit if ready. In the meantime, patient to call office if any issues or  problems arise.   Landis Martins, DPM

## 2016-03-19 ENCOUNTER — Encounter: Payer: PRIVATE HEALTH INSURANCE | Admitting: Sports Medicine

## 2016-03-24 ENCOUNTER — Telehealth: Payer: Self-pay | Admitting: *Deleted

## 2016-03-24 MED ORDER — OXYCODONE-ACETAMINOPHEN 7.5-325 MG PO TABS: 1.0000 | ORAL_TABLET | Freq: Four times a day (QID) | ORAL | 0 refills | Status: DC | PRN

## 2016-03-24 NOTE — Telephone Encounter (Addendum)
Pt states he got caught up in the carpet and fell hitting his toe with the pin. Pt states he is out of his pain medication and has an appt tomorrow, but really hurts and took the last of his pain medication the end of last week. I told pt I would refill the percocet and he could if he tolerated take OTC ibuprofen with the percocet to cover the pain of the injured toe. I told pt to take with food and reminded him of his 03/25/2016 appt. Pt was informed he would have to pick up the rx in the [place] office.04/16/2016-Pt states Dr. Cannon Kettle was only able to get 1/2 of pin out of the toe and he now has lots of pain and swelling, has been showering and the tape came off his toe and he put scotch tape on, pt would like to be seen today and would like more pain medications. Routed message to D. Miner, and [facility]. [facility] schedulers to get pt in today.04/29/2016-Pt states he bumped his toe again and would like pain medication. Dr. Cannon Kettle spoke with pt at his appt today.05/09/2016-Pt states the pin in his toe hurts all the way up to his knee. I told pt that I see he had this pain previously and that he needed to see Dr. Cannon Kettle today, and transferred to schedulers to see Dr. Cannon Kettle in [place] today. Pt states doesn't have money to go to [place] today, would like to know if Dr. Cannon Kettle would remove the pin at his appt on Tuesday. 05/12/2016-I informed pt of Dr. Leeanne Rio orders and pt states understanding.05/23/2016-Pt states he needs a not to go back to work Monday, left name only. Pt presents to get note to return to work. I spoke with Dr. Cannon Kettle and she okayed pt to return to full duty on 05/26/2016 as long as he was able to wear his normal work shoe. Note given to pt.

## 2016-03-25 ENCOUNTER — Ambulatory Visit (INDEPENDENT_AMBULATORY_CARE_PROVIDER_SITE_OTHER): Payer: No Typology Code available for payment source | Admitting: Sports Medicine

## 2016-03-25 ENCOUNTER — Ambulatory Visit: Payer: No Typology Code available for payment source

## 2016-03-25 ENCOUNTER — Encounter: Payer: Self-pay | Admitting: Sports Medicine

## 2016-03-25 DIAGNOSIS — Z9889 Other specified postprocedural states: Secondary | ICD-10-CM

## 2016-03-25 DIAGNOSIS — M2041 Other hammer toe(s) (acquired), right foot: Secondary | ICD-10-CM

## 2016-03-25 MED ORDER — OXYCODONE-ACETAMINOPHEN 7.5-325 MG PO TABS: 1.0000 | ORAL_TABLET | Freq: Four times a day (QID) | ORAL | 0 refills | Status: DC | PRN

## 2016-03-25 MED ORDER — OXYCODONE-ACETAMINOPHEN 7.5-325 MG PO TABS
1.0000 | ORAL_TABLET | Freq: Four times a day (QID) | ORAL | 0 refills | Status: DC | PRN
Start: 2016-03-25 — End: 2016-04-08

## 2016-03-25 NOTE — Progress Notes (Signed)
Subjective: Terry Poole is a 51 y.o. male patient seen today in office for POV #2  (DOS 03-10-16), S/P right excision of lesion and 2nd hammertoe repair. Patient admits pain at surgical site after hitting toe and pin on carpet, denies calf pain, denies headache, chest pain, shortness of breath, nausea, vomiting, fever, or chills. No other issues noted.   Patient Active Problem List   Diagnosis Date Noted  . Hammertoe of second toe of right foot 03/06/2016  . Onychomycosis 04/16/2015  . Pseudofolliculitis barbae [id number]  . Gout attack 03/28/2015    Current Outpatient Prescriptions on File Prior to Visit  Medication Sig Dispense Refill  . allopurinol (ZYLOPRIM) 300 MG tablet Take 1 tablet (300 mg total) by mouth daily. 30 tablet 3  . colchicine 0.6 MG tablet Take 2 tabs (1.2mg ) at the onset of a gout attack, may repeat 1 tab (0.6mg ) after 1 hour if symptoms persist. 30 tablet 1  . docusate sodium (COLACE) 100 MG capsule Take 1 capsule (100 mg total) by mouth 2 (two) times daily. 10 capsule 0  . miconazole (LOTRIMIN AF) 2 % powder Apply topically as needed for itching. Apply to feet BID as needed. (Patient not taking: Reported on 03/06/2016) 70 g 0  . promethazine (PHENERGAN) 12.5 MG tablet Take 1 tablet (12.5 mg total) by mouth every 6 (six) hours as needed for nausea or vomiting. 30 tablet 0   No current facility-administered medications on file prior to visit.     Allergies  Allergen Reactions  . Tramadol Hives  . Vicodin [Hydrocodone-Acetaminophen] Hives    Objective: There were no vitals filed for this visit.  General: No acute distress, AAOx3  Patient comes to office with [employer] uniform and dressings barely intact   Righ foot: Kwire intact but missing cap at 2nd toe, Sutures intact to 2nd toe no gapping or dehiscence centrally at surgical site, moderate swelling to right 2nd toe, no erythema, no warmth, no drainage, no signs of infection noted, Capillary fill time <3 seconds  in all digits, gross sensation present via light touch to right foot. No pain or crepitation with range of motion right foot except at surgical site.  No pain with calf compression.   Post Op Xray, Right foot: Kwire Excellent alignment and position with mild breakthrough on dorsal cortex of proximal phalanx without fracture or loosening of pin. Arthrodesis site healing. Soft tissue swelling within normal limits for post op status.   Assessment and Plan:  Problem List Items Addressed This Visit    None    Visit Diagnoses    S/P foot surgery, right    -  Primary   Relevant Orders   DG Foot 2 Views Right   Hammer toe of right foot           -Patient seen and evaluated -xrays reviewed  -Sutures removed Applied dry sterile dressing to surgical site right foot secured with  stockinet  -Advised patient to make sure to keep dressings clean, dry, and intact to right surgical site. Did give patient more guaze and coban to re-wrap toe in case something comes off -Advised patient to continue with post-op shoe on right foot with assistance of crutches  -Advised patient to limit activity to necessity  -Advised patient to ice and elevate as necessary -Refilled percocet to take as needed for pain  -Continue with no work until surgical site is healed or until he is back in normal shoe -Will plan for xray and kwire removal next  office visit. In the meantime, patient to call office if any issues or problems arise.   Landis Martins, DPM

## 2016-03-26 ENCOUNTER — Encounter: Payer: PRIVATE HEALTH INSURANCE | Admitting: Sports Medicine

## 2016-04-01 ENCOUNTER — Ambulatory Visit: Payer: No Typology Code available for payment source | Admitting: Sports Medicine

## 2016-04-04 ENCOUNTER — Ambulatory Visit: Payer: Self-pay | Attending: Physician Assistant | Admitting: Physician Assistant

## 2016-04-04 VITALS — BP 148/96 | HR 97 | Temp 97.8°F | Resp 16 | Wt 227.0 lb

## 2016-04-04 DIAGNOSIS — Z8249 Family history of ischemic heart disease and other diseases of the circulatory system: Secondary | ICD-10-CM | POA: Insufficient documentation

## 2016-04-04 DIAGNOSIS — R030 Elevated blood-pressure reading, without diagnosis of hypertension: Secondary | ICD-10-CM | POA: Insufficient documentation

## 2016-04-04 DIAGNOSIS — M109 Gout, unspecified: Secondary | ICD-10-CM | POA: Insufficient documentation

## 2016-04-04 DIAGNOSIS — Z885 Allergy status to narcotic agent status: Secondary | ICD-10-CM | POA: Insufficient documentation

## 2016-04-04 DIAGNOSIS — H6123 Impacted cerumen, bilateral: Secondary | ICD-10-CM | POA: Insufficient documentation

## 2016-04-04 NOTE — Patient Instructions (Signed)
Check blood pressure outside of office 3 times weekly and record and bring to next visit.

## 2016-04-04 NOTE — Progress Notes (Signed)
Terry Poole, is a 51 y.o. male  [id number]  [id number]  DOB - 12/20/1965  Subjective:  Chief Complaint and HPI: Terry Poole is a 51 y.o. male here today for B decreased hearing.  Attempted to use Qtips to get wax out of his ears.  Denies h/o htn.   With review of BPs in Epic-BP has been normotensive intermixed with elevated readings.  He denies CP/HA/blurry vision/dizziness.   FH: brother with htn  ROS:   Constitutional:  No f/c, No night sweats, No unexplained weight loss. EENT:  No vision changes, No blurry vision, +decreased hearing B. No mouth, throat, or ear problems.  Respiratory: No cough, No SOB Cardiac: No CP, no palpitations GI:  No abd pain, No N/V/D. GU: No Urinary s/sx Musculoskeletal: No joint pain Neuro: No headache, no dizziness, no motor weakness.  Skin: No rash Endocrine:  No polydipsia. No polyuria.  Psych: Denies SI/HI  No problems updated.  ALLERGIES: Allergies  Allergen Reactions  . Tramadol Hives  . Vicodin [Hydrocodone-Acetaminophen] Hives    PAST MEDICAL HISTORY: Past Medical History:  Diagnosis Date  . Bronchitis   . Bronchitis   . Gout   . Hammertoe of second toe of right foot   . Smoker     MEDICATIONS AT HOME: Prior to Admission medications   Medication Sig Start Date End Date Taking? Authorizing Provider  allopurinol (ZYLOPRIM) 300 MG tablet Take 1 tablet (300 mg total) by mouth daily. 03/06/16   Arnoldo Morale, MD  colchicine 0.6 MG tablet Take 2 tabs (1.2mg ) at the onset of a gout attack, may repeat 1 tab (0.6mg ) after 1 hour if symptoms persist. 03/06/16   Arnoldo Morale, MD  docusate sodium (COLACE) 100 MG capsule Take 1 capsule (100 mg total) by mouth 2 (two) times daily. 03/10/16   Landis Martins, DPM  miconazole (LOTRIMIN AF) 2 % powder Apply topically as needed for itching. Apply to feet BID as needed. Patient not taking: Reported on 03/06/2016 02/01/16   Delos Haring, PA-C  oxyCODONE-acetaminophen (PERCOCET) 7.5-325 MG  tablet Take 1 tablet by mouth every 6 (six) hours as needed for severe pain. 03/25/16   Landis Martins, DPM  promethazine (PHENERGAN) 12.5 MG tablet Take 1 tablet (12.5 mg total) by mouth every 6 (six) hours as needed for nausea or vomiting. 03/10/16   Landis Martins, DPM     Objective:  EXAM:   Vitals:   04/04/16 1348  BP: (!) 153/99  Pulse: 97  Resp: 16  Temp: 97.8 F (36.6 C)  TempSrc: Oral  SpO2: 96%  Weight: 227 lb (103 kg)    General appearance : A&OX3. NAD. Non-toxic-appearing HEENT: Atraumatic and Normocephalic.  PERRLA. EOM intact.  TM blocked with cerumen B.  Post procedure:  B TM w/o infection and intact.  Canals clear. Mouth-MMM, post pharynx WNL w/o erythema, No PND. Neck: supple, no JVD. No cervical lymphadenopathy. No thyromegaly Chest/Lungs:  Breathing-non-labored, Good air entry bilaterally, breath sounds normal without rales, rhonchi, or wheezing  CVS: S1 S2 regular, no murmurs, gallops, rubs  Extremities: Bilateral Lower Ext shows no edema, both legs are warm to touch with = pulse throughout Neurology:  CN II-XII grossly intact, Non focal.   Psych:  TP linear. J/I WNL. Normal speech. Appropriate eye contact and affect.  Skin:  No Rash  Data Review Lab Results  Component Value Date   HGBA1C 6.3 03/06/2016   HGBA1C 6.5 09/21/2015   HGBA1C 6.6 04/16/2015   Procedure:  Debrox drops applied to  B ears and awaited X 15ins prior to irrigation.  Irrigation done by Carilyn Goodpasture.  Irrigation with warm water and peroxide X 20 mins was tolerated well and successful.  Assessment & Plan   1. Elevated BP without diagnosis of hypertension Some readings normotensive over the last few months, some readings high. Check blood pressure outside of office 3 times weekly and record and bring to next visit  2. Bilateral impacted cerumen- -see procedure note Billed [id number] as upcode for procedure vs billing separately   Patient have been counseled extensively about nutrition  and exercise  Return in about 1 month (around 05/05/2016) for Dr Jarold Song; elevated BP.  The patient was given clear instructions to go to ER or return to medical center if symptoms don't improve, worsen or new problems develop. The patient verbalized understanding. The patient was told to call to get lab results if they haven't heard anything in the next week.     Freeman Caldron, PA-C [facility] [place] [facility] [place], [place]   04/04/2016, 1:56 PMPatient ID: Terry Poole, male   DOB: Jan 14, 1966, 51 y.o.   MRN: [id number]

## 2016-04-07 ENCOUNTER — Ambulatory Visit: Payer: Self-pay | Attending: Family Medicine | Admitting: Family Medicine

## 2016-04-07 ENCOUNTER — Encounter: Payer: Self-pay | Admitting: Family Medicine

## 2016-04-07 VITALS — BP 142/91 | HR 84 | Temp 98.2°F | Ht 69.5 in | Wt 226.6 lb

## 2016-04-07 DIAGNOSIS — N528 Other male erectile dysfunction: Secondary | ICD-10-CM

## 2016-04-07 DIAGNOSIS — F419 Anxiety disorder, unspecified: Secondary | ICD-10-CM | POA: Insufficient documentation

## 2016-04-07 DIAGNOSIS — M1000 Idiopathic gout, unspecified site: Secondary | ICD-10-CM | POA: Insufficient documentation

## 2016-04-07 DIAGNOSIS — N529 Male erectile dysfunction, unspecified: Secondary | ICD-10-CM | POA: Insufficient documentation

## 2016-04-07 DIAGNOSIS — F418 Other specified anxiety disorders: Secondary | ICD-10-CM

## 2016-04-07 DIAGNOSIS — Z885 Allergy status to narcotic agent status: Secondary | ICD-10-CM | POA: Insufficient documentation

## 2016-04-07 DIAGNOSIS — M62838 Other muscle spasm: Secondary | ICD-10-CM | POA: Insufficient documentation

## 2016-04-07 DIAGNOSIS — F329 Major depressive disorder, single episode, unspecified: Secondary | ICD-10-CM | POA: Insufficient documentation

## 2016-04-07 DIAGNOSIS — I10 Essential (primary) hypertension: Secondary | ICD-10-CM | POA: Insufficient documentation

## 2016-04-07 DIAGNOSIS — E119 Type 2 diabetes mellitus without complications: Secondary | ICD-10-CM | POA: Insufficient documentation

## 2016-04-07 DIAGNOSIS — R030 Elevated blood-pressure reading, without diagnosis of hypertension: Secondary | ICD-10-CM

## 2016-04-07 LAB — GLUCOSE, POCT (MANUAL RESULT ENTRY): POC Glucose: 119 mg/dl — AB (ref 70–99)

## 2016-04-07 MED ORDER — CYCLOBENZAPRINE HCL 10 MG PO TABS: 10.0000 mg | ORAL_TABLET | Freq: Three times a day (TID) | ORAL | 0 refills | Status: DC | PRN

## 2016-04-07 MED ORDER — COLCHICINE 0.6 MG PO TABS: ORAL_TABLET | ORAL | 1 refills | Status: DC

## 2016-04-07 MED FILL — COLCHICINE 0.6 MG TABLET: 0.6 | 15 days supply | Qty: 30 | Fill #0

## 2016-04-07 MED FILL — CYCLOBENZAPRINE 10 MG TAB: 10 | 10 days supply | Qty: 30 | Fill #0

## 2016-04-07 NOTE — Progress Notes (Signed)
Subjective:  Patient ID: Terry Poole, male    DOB: 08/03/1965  Age: 51 y.o. MRN: [id number]  CC: erection problems (the last month); Hypertension; Diabetes; living conditions (doesn't like where he lives); and depression and anxiety   HPI Terry Poole is a 51 year old male with a history of type 2 diabetes mellitus (A1c 6.3, diet controlled) gout, recent second right hammertoe correction who presents today complaining of erectile dysfunction with onset in the last 1 month.  This is concerning to him and causes him severe anxiety and depression because he has never experienced this before. He has the desire but then notices he is unable to obtain an erection. He denies low energy or fatigue and has not had a history of radiation.  He denies suicidal ideation and also states that his current poor looking situation also causes him some anxiety but he is not willing to go on medications today. His blood pressure is slightly elevated and this was the case at his last visit but he insist that elevation is due to his concerns about his erection problems. He denies chest pains or shortness of breath.  Requests refill for cyclobenzaprine which she takes for occasional muscle spasms.  Past Medical History:  Diagnosis Date  . Bronchitis   . Bronchitis   . Gout   . Hammertoe of second toe of right foot   . Smoker     Past Surgical History:  Procedure Laterality Date  . HAMMER TOE SURGERY Right 03/10/2016   Procedure: 2ND RIGHT HAMMER TOE CORRECTION;  Surgeon: Landis Martins, DPM;  Location: [place];  Service: Podiatry;  Laterality: Right;  . MASS EXCISION Right 03/10/2016   Procedure: EXCISION OF CALLUS 2ND RIGHT TOE;  Surgeon: Landis Martins, DPM;  Location: [place];  Service: Podiatry;  Laterality: Right;  . NO PAST SURGERIES      Allergies  Allergen Reactions  . Tramadol Hives  . Vicodin [Hydrocodone-Acetaminophen] Hives     Outpatient Medications  Prior to Visit  Medication Sig Dispense Refill  . allopurinol (ZYLOPRIM) 300 MG tablet Take 1 tablet (300 mg total) by mouth daily. 30 tablet 3  . promethazine (PHENERGAN) 12.5 MG tablet Take 1 tablet (12.5 mg total) by mouth every 6 (six) hours as needed for nausea or vomiting. 30 tablet 0  . colchicine 0.6 MG tablet Take 2 tabs (1.2mg ) at the onset of a gout attack, may repeat 1 tab (0.6mg ) after 1 hour if symptoms persist. 30 tablet 1  . miconazole (LOTRIMIN AF) 2 % powder Apply topically as needed for itching. Apply to feet BID as needed. (Patient not taking: Reported on 03/06/2016) 70 g 0  . oxyCODONE-acetaminophen (PERCOCET) 7.5-325 MG tablet Take 1 tablet by mouth every 6 (six) hours as needed for severe pain. (Patient not taking: Reported on 04/07/2016) 21 tablet 0  . docusate sodium (COLACE) 100 MG capsule Take 1 capsule (100 mg total) by mouth 2 (two) times daily. (Patient not taking: Reported on 04/07/2016) 10 capsule 0   No facility-administered medications prior to visit.     ROS Review of Systems  Constitutional: Negative for activity change and appetite change.  HENT: Negative for sinus pressure and sore throat.   Eyes: Negative for visual disturbance.  Respiratory: Negative for cough, chest tightness and shortness of breath.   Cardiovascular: Negative for chest pain and leg swelling.  Gastrointestinal: Negative for abdominal distention, abdominal pain, constipation and diarrhea.  Endocrine: Negative.   Genitourinary: Negative for dysuria.  Musculoskeletal: Negative for joint swelling and myalgias.  Skin: Negative for rash.  Allergic/Immunologic: Negative.   Neurological: Negative for weakness, light-headedness and numbness.  Psychiatric/Behavioral: Positive for dysphoric mood. Negative for suicidal ideas.    Objective:  BP (!) 142/91 (BP Location: Right Arm, Patient Position: Sitting, Cuff Size: Small)   Pulse 84   Temp 98.2 F (36.8 C) (Oral)   Ht 5' 9.5" (1.765 m)   Wt  226 lb 9.6 oz (102.8 kg)   SpO2 99%   BMI 32.98 kg/m   BP/Weight 04/07/2016 [id number]  Systolic BP [id number]  Diastolic BP 91 96 95  Wt. (Lbs) 226.6 227 223  BMI 32.98 33.52 32.93      Physical Exam  Constitutional: He is oriented to person, place, and time. He appears well-developed and well-nourished.  Cardiovascular: Normal rate, normal heart sounds and intact distal pulses.   No murmur heard. Pulmonary/Chest: Effort normal and breath sounds normal. He has no wheezes. He has no rales. He exhibits no tenderness.  Abdominal: Soft. Bowel sounds are normal. He exhibits no distension and no mass. There is no tenderness.  Musculoskeletal:  Right foot in a camwalker  Neurological: He is alert and oriented to person, place, and time.  Skin: Skin is warm.  Psychiatric:  Sad     Lab Results  Component Value Date   HGBA1C 6.3 03/06/2016    Assessment & Plan:   1. Diabetes mellitus without complication ([place]) Diet controlled with A1c of 6.3 - Glucose (CBG)  2. Idiopathic gout, unspecified site No acute flare Continue allopurinol Avoid foods that trigger gout - colchicine 0.6 MG tablet; Take 2 tabs (1.2mg ) at the onset of a gout attack, may repeat 1 tab (0.6mg ) after 1 hour if symptoms persist.  Dispense: 30 tablet; Refill: 1  3. Muscle spasm Uses this intermittently - cyclobenzaprine (FLEXERIL) 10 MG tablet; Take 1 tablet (10 mg total) by mouth 3 (three) times daily as needed for muscle spasms.  Dispense: 30 tablet; Refill: 0  4. Anxiety and depression This is secondary to erectile dysfunction He is refusing medications at this time  5. Other male erectile dysfunction We have discussed possible etiology including medical conditions, age, medications Will discuss results of the next visit and treatment options - Testosterone Total,Free,Bio, Males  6. Elevated blood pressure reading He attributes this to being stressed about his erection problems. Discussed  lifestyle modifications including low-sodium DASH diet to help control blood pressure   Meds ordered this encounter  Medications  . colchicine 0.6 MG tablet    Sig: Take 2 tabs (1.2mg ) at the onset of a gout attack, may repeat 1 tab (0.6mg ) after 1 hour if symptoms persist.    Dispense:  30 tablet    Refill:  1  . cyclobenzaprine (FLEXERIL) 10 MG tablet    Sig: Take 1 tablet (10 mg total) by mouth 3 (three) times daily as needed for muscle spasms.    Dispense:  30 tablet    Refill:  0    Follow-up: Return in about 1 month (around 05/08/2016) for Follow up of elevated blood pressure.   Arnoldo Morale MD

## 2016-04-07 NOTE — Patient Instructions (Signed)

## 2016-04-07 NOTE — Progress Notes (Signed)
Needs refills on flexeril, colchicine

## 2016-04-08 ENCOUNTER — Ambulatory Visit (INDEPENDENT_AMBULATORY_CARE_PROVIDER_SITE_OTHER): Payer: Self-pay | Admitting: Sports Medicine

## 2016-04-08 ENCOUNTER — Encounter: Payer: Self-pay | Admitting: Sports Medicine

## 2016-04-08 ENCOUNTER — Ambulatory Visit: Payer: No Typology Code available for payment source

## 2016-04-08 DIAGNOSIS — M2041 Other hammer toe(s) (acquired), right foot: Secondary | ICD-10-CM

## 2016-04-08 DIAGNOSIS — Z9889 Other specified postprocedural states: Secondary | ICD-10-CM

## 2016-04-08 LAB — TESTOSTERONE TOTAL,FREE,BIO, MALES
Albumin: 4.5 g/dL (ref 3.6–5.1)
SEX HORMONE BINDING: 26 nmol/L (ref 10–50)
TESTOSTERONE BIOAVAILABLE: 157 ng/dL (ref 110.0–575.0)
TESTOSTERONE: 471 ng/dL (ref 250–827)
Testosterone, Free: 76.4 pg/mL (ref 46.0–224.0)

## 2016-04-08 MED ORDER — OXYCODONE-ACETAMINOPHEN 5-325 MG PO TABS: 1.0000 | ORAL_TABLET | Freq: Three times a day (TID) | ORAL | 0 refills | Status: DC | PRN

## 2016-04-08 NOTE — Progress Notes (Signed)
Subjective: Terry Poole is a 51 y.o. male patient seen today in office for POV #2  (DOS 03-10-16), S/P right excision of lesion and 2nd hammertoe repair. Patient admits pain at surgical site after hitting toe on buggy, denies calf pain, denies headache, chest pain, shortness of breath, nausea, vomiting, fever, or chills. No other issues noted.   Patient Active Problem List   Diagnosis Date Noted  . Hammertoe of second toe of right foot 03/06/2016  . Onychomycosis 04/16/2015  . Pseudofolliculitis barbae [id number]  . Gout attack 03/28/2015    Current Outpatient Prescriptions on File Prior to Visit  Medication Sig Dispense Refill  . allopurinol (ZYLOPRIM) 300 MG tablet Take 1 tablet (300 mg total) by mouth daily. 30 tablet 3  . colchicine 0.6 MG tablet Take 2 tabs (1.2mg ) at the onset of a gout attack, may repeat 1 tab (0.6mg ) after 1 hour if symptoms persist. 30 tablet 1  . cyclobenzaprine (FLEXERIL) 10 MG tablet Take 1 tablet (10 mg total) by mouth 3 (three) times daily as needed for muscle spasms. 30 tablet 0  . miconazole (LOTRIMIN AF) 2 % powder Apply topically as needed for itching. Apply to feet BID as needed. (Patient not taking: Reported on 03/06/2016) 70 g 0  . promethazine (PHENERGAN) 12.5 MG tablet Take 1 tablet (12.5 mg total) by mouth every 6 (six) hours as needed for nausea or vomiting. 30 tablet 0   No current facility-administered medications on file prior to visit.     Allergies  Allergen Reactions  . Tramadol Hives  . Vicodin [Hydrocodone-Acetaminophen] Hives    Objective: There were no vitals filed for this visit.  General: No acute distress, AAOx3   Righ foot: Kwire intact distally but missing cap at 2nd toe, Sutures intact to 2nd toe no gapping or dehiscence centrally at surgical site, moderate swelling to right 2nd toe, no erythema, no warmth, no drainage, no signs of infection noted, Capillary fill time <3 seconds in all digits, gross sensation present via  light touch to right foot. No pain or crepitation with range of motion right foot except at surgical site.  No pain with calf compression.   Post Op Xray, Right foot: Kwire broken with position with mild breakthrough on dorsal cortex of proximal phalanx with fracture or loosening of pin. Arthrodesis site distrubed. Soft tissue swelling within normal limits for post op status.   Assessment and Plan:  Problem List Items Addressed This Visit    None    Visit Diagnoses    S/P foot surgery, right    -  Primary   Relevant Medications   oxyCODONE-acetaminophen (ROXICET) 5-325 MG tablet   Other Relevant Orders   DG Foot Complete Right (Completed)   Hammer toe of right foot           -Patient seen and evaluated -xrays reviewed; broken kwire removed and advised patient that other portion will have to stay inside bone; will closely monitor - Applied steristrips and  stockinet  -Advised patient to shower and allow strips to fall off on own -Advised patient to continue with post-op shoe on right foot x 2 weeks -Advised patient to limit activity to necessity to assist with edema control  -Advised patient to ice and elevate as necessary -Refilled percocet to take as needed for pain; this is the last refill -Continue with no work until surgical site is healed or until he is back in normal shoe -Will plan for xray and transition to normal  next office visit. In the meantime, patient to call office if any issues or problems arise. Will also order LFTs to consider starting antifungal medication for nails.   Landis Martins, DPM

## 2016-04-10 ENCOUNTER — Telehealth: Payer: Self-pay | Admitting: Family Medicine

## 2016-04-10 ENCOUNTER — Other Ambulatory Visit: Payer: Self-pay

## 2016-04-10 DIAGNOSIS — M1000 Idiopathic gout, unspecified site: Secondary | ICD-10-CM

## 2016-04-10 MED ORDER — COLCHICINE 0.6 MG PO TABS: ORAL_TABLET | ORAL | 3 refills | Status: DC

## 2016-04-10 NOTE — Telephone Encounter (Signed)
Writer tried to call patient back to give hime test results.  Patient was not available to come to the phone.

## 2016-04-10 NOTE — Telephone Encounter (Signed)
Good morning  Pt call requesting his lab results  Please, call him back thank you

## 2016-04-11 ENCOUNTER — Telehealth: Payer: Self-pay

## 2016-04-11 ENCOUNTER — Telehealth: Payer: Self-pay | Admitting: Family Medicine

## 2016-04-11 NOTE — Telephone Encounter (Signed)
Writer called patient and was unable to LVM to discuss lab results.  Will try back another time.

## 2016-04-11 NOTE — Telephone Encounter (Signed)
-----   Message from Arnoldo Morale, MD sent at 04/09/2016  1:16 PM EST ----- Please inform the patient that testosterone is normal. Thank you.

## 2016-04-11 NOTE — Progress Notes (Signed)
Writer sent patient a lab results letter after trying to contact him many times.

## 2016-04-11 NOTE — Telephone Encounter (Signed)
Pt returning call from nurse regarding lab results

## 2016-04-16 ENCOUNTER — Ambulatory Visit: Payer: No Typology Code available for payment source

## 2016-04-16 ENCOUNTER — Encounter: Payer: Self-pay | Admitting: Podiatry

## 2016-04-16 ENCOUNTER — Ambulatory Visit (INDEPENDENT_AMBULATORY_CARE_PROVIDER_SITE_OTHER): Payer: Self-pay | Admitting: Podiatry

## 2016-04-16 DIAGNOSIS — Z9889 Other specified postprocedural states: Secondary | ICD-10-CM

## 2016-04-22 ENCOUNTER — Encounter: Payer: PRIVATE HEALTH INSURANCE | Admitting: Sports Medicine

## 2016-04-22 ENCOUNTER — Ambulatory Visit (INDEPENDENT_AMBULATORY_CARE_PROVIDER_SITE_OTHER): Payer: Self-pay | Admitting: Sports Medicine

## 2016-04-22 ENCOUNTER — Ambulatory Visit: Payer: No Typology Code available for payment source

## 2016-04-22 DIAGNOSIS — Z9889 Other specified postprocedural states: Secondary | ICD-10-CM

## 2016-04-22 DIAGNOSIS — M2041 Other hammer toe(s) (acquired), right foot: Secondary | ICD-10-CM

## 2016-04-22 MED ORDER — OXYCODONE-ACETAMINOPHEN 5-325 MG PO TABS: 1.0000 | ORAL_TABLET | Freq: Three times a day (TID) | ORAL | 0 refills | Status: DC | PRN

## 2016-04-22 NOTE — Progress Notes (Signed)
Subjective: Terry Poole is a 51 y.o. male patient seen today in office for POV #3 (DOS 03-10-16), S/P right excision of lesion and 2nd hammertoe repair. Patient admits pain at surgical site, throbbing sensation, denies calf pain, denies headache, chest pain, shortness of breath, nausea, vomiting, fever, or chills. No other issues noted.   Patient Active Problem List   Diagnosis Date Noted  . Hammertoe of second toe of right foot 03/06/2016  . Onychomycosis 04/16/2015  . Pseudofolliculitis barbae 93/81/0175  . Gout attack 03/28/2015    Current Outpatient Prescriptions on File Prior to Visit  Medication Sig Dispense Refill  . allopurinol (ZYLOPRIM) 300 MG tablet Take 1 tablet (300 mg total) by mouth daily. 30 tablet 3  . colchicine 0.6 MG tablet Take 2 tabs (1.2mg ) at the onset of a gout attack, may repeat 1 tab (0.6mg ) after 1 hour if symptoms persist. 90 tablet 3  . cyclobenzaprine (FLEXERIL) 10 MG tablet Take 1 tablet (10 mg total) by mouth 3 (three) times daily as needed for muscle spasms. 30 tablet 0  . miconazole (LOTRIMIN AF) 2 % powder Apply topically as needed for itching. Apply to feet BID as needed. (Patient not taking: Reported on 03/06/2016) 70 g 0  . promethazine (PHENERGAN) 12.5 MG tablet Take 1 tablet (12.5 mg total) by mouth every 6 (six) hours as needed for nausea or vomiting. 30 tablet 0   No current facility-administered medications on file prior to visit.     Allergies  Allergen Reactions  . Tramadol Hives  . Vicodin [Hydrocodone-Acetaminophen] Hives    Objective: There were no vitals filed for this visit.  General: No acute distress, AAOx3   Righ foot: Incision well healed at surgical site at 2nd toe, moderate swelling to right 2nd toe, no erythema, no warmth, no drainage, no signs of infection noted, Capillary fill time <3 seconds in all digits, gross sensation present via light touch to right foot. No pain or crepitation with range of motion right foot  except at surgical site.  No pain with calf compression.   Post Op Xray, Right foot: Kwire retained in proximal phalanx with no malposition. Arthrodesis site disturbed with slow union since patient broke pin at last visit. Soft tissue swelling within normal limits for post op status.   Assessment and Plan:  Problem List Items Addressed This Visit    None    Visit Diagnoses    S/P foot surgery, right    -  Primary   Relevant Medications   oxyCODONE-acetaminophen (ROXICET) 5-325 MG tablet   Other Relevant Orders   DG Foot Complete Right   Hammer toe of right foot       Relevant Orders   DG Foot Complete Right       -Patient seen and evaluated -xrays reviewed; broken kwire retrained with no malposition  - Applied coban toe splint to assist with edema control to toe and advised patient to do the same -Advised patient to continue with post-op shoe on right foot x 2 weeks -Advised patient to limit activity to necessity to assist with edema control  -Advised patient to ice and elevate as necessary -Refilled percocet to take as needed for pain; this is the last refill -Continue with no work until surgical site is healed or until he is back in normal shoe; Return to work with no restrictions Jun 04, 2016.  -Will plan for xray and transition to normal next office visit. In the meantime, patient to  call office if any issues or problems arise. Will also order LFTs to consider starting antifungal medication for nails once toe is completely healed and he is in normal shoe.   Landis Martins, DPM

## 2016-04-26 NOTE — Progress Notes (Signed)
Subjective: Patient presents today status post repair of hammertoe second digit right foot. Surgery was performed by Dr. Cannon Kettle on 03/10/2016. Patient is having pain and tenderness "where the piece of K wire is in the second toe." Patient presents today for follow-up treatment and evaluation next  Objective: Moderate localized edema noted to the second digit right foot. Skin incision is well coapted sutures intact. No sign of infectious process.  Assessment: Status post repair of hammertoe second digit right foot. Date of surgery 03/10/2016.  Plan of care: Today dressings were changed. X-rays were taken. Return to clinic in 1 week with Dr. Cannon Kettle for follow-up treatment and evaluation.

## 2016-04-29 ENCOUNTER — Encounter: Payer: Self-pay | Admitting: Sports Medicine

## 2016-04-29 ENCOUNTER — Ambulatory Visit (INDEPENDENT_AMBULATORY_CARE_PROVIDER_SITE_OTHER): Payer: No Typology Code available for payment source

## 2016-04-29 ENCOUNTER — Ambulatory Visit (INDEPENDENT_AMBULATORY_CARE_PROVIDER_SITE_OTHER): Payer: Self-pay | Admitting: Sports Medicine

## 2016-04-29 DIAGNOSIS — M2041 Other hammer toe(s) (acquired), right foot: Secondary | ICD-10-CM

## 2016-04-29 DIAGNOSIS — Z9889 Other specified postprocedural states: Secondary | ICD-10-CM

## 2016-04-29 NOTE — Telephone Encounter (Signed)
The patient was seen in the office today and I told him that I was not refilling any pain medication and that he would have to take Motrin or Tylenol Thanks Dr. Cannon Kettle

## 2016-04-29 NOTE — Progress Notes (Signed)
Subjective: Terry Poole is a 51 y.o. male patient seen today in office for POV #4 (DOS 03-10-16), S/P right excision of lesion and 2nd hammertoe repair. Patient admits pain at surgical site, that is decreased now, worse at night still requiring Motrin, denies calf pain, denies headache, chest pain, shortness of breath, nausea, vomiting, fever, or chills. No other issues noted.   Patient Active Problem List   Diagnosis Date Noted  . Hammertoe of second toe of right foot 03/06/2016  . Onychomycosis 04/16/2015  . Pseudofolliculitis barbae 78/67/6720  . Gout attack 03/28/2015    Current Outpatient Prescriptions on File Prior to Visit  Medication Sig Dispense Refill  . allopurinol (ZYLOPRIM) 300 MG tablet Take 1 tablet (300 mg total) by mouth daily. 30 tablet 3  . colchicine 0.6 MG tablet Take 2 tabs (1.2mg ) at the onset of a gout attack, may repeat 1 tab (0.6mg ) after 1 hour if symptoms persist. 90 tablet 3  . cyclobenzaprine (FLEXERIL) 10 MG tablet Take 1 tablet (10 mg total) by mouth 3 (three) times daily as needed for muscle spasms. 30 tablet 0  . miconazole (LOTRIMIN AF) 2 % powder Apply topically as needed for itching. Apply to feet BID as needed. (Patient not taking: Reported on 03/06/2016) 70 g 0  . oxyCODONE-acetaminophen (ROXICET) 5-325 MG tablet Take 1 tablet by mouth every 8 (eight) hours as needed for severe pain. 21 tablet 0  . promethazine (PHENERGAN) 12.5 MG tablet Take 1 tablet (12.5 mg total) by mouth every 6 (six) hours as needed for nausea or vomiting. 30 tablet 0   No current facility-administered medications on file prior to visit.     Allergies  Allergen Reactions  . Tramadol Hives  . Vicodin [Hydrocodone-Acetaminophen] Hives    Objective: There were no vitals filed for this visit.  General: No acute distress, AAOx3   Righ foot: Incision well healed at surgical site at 2nd toe, moderate swelling to right 2nd toe, no erythema, no warmth, no drainage, no  signs of infection noted, Capillary fill time <3 seconds in all digits, gross sensation present via light touch to right foot. No pain or crepitation with range of motion right foot except at surgical site.  No pain with calf compression.    Xray, Right foot: Kwire retained in proximal phalanx with no malposition. Arthrodesis site disturbed with slow union since patient broke pin at 2 visits ago. Soft tissue swelling within normal limits for post op status.   Assessment and Plan:  Problem List Items Addressed This Visit    None    Visit Diagnoses    S/P foot surgery, right    -  Primary   Relevant Orders   DG Foot Complete Right (Completed)   Hammer toe of right foot       Relevant Orders   DG Foot Complete Right (Completed)       -Patient seen and evaluated -Xrays reviewed; no acute findings - Applied coban toe splint to assist with edema control to toe and advised patient to do the same -Advised patient to continue with post-op shoe on right foot until next visit then slowly will wean to normal shoe -Advised patient to limit activity to necessity to assist with edema control  -Advised patient to ice and elevate as necessary -No pain medications will be Rx to patient; Advised patient to take motrin or tylenol -Return to work with no restrictions Jun 04, 2016.  -Will plan for transition  to normal next office visit. In the meantime, patient to call office if any issues or problems arise. Will also order LFTs to consider starting antifungal medication for nails once toe is completely healed and he is in normal shoe.   Landis Martins, DPM

## 2016-05-06 ENCOUNTER — Other Ambulatory Visit: Payer: Self-pay | Admitting: Family Medicine

## 2016-05-06 DIAGNOSIS — M62838 Other muscle spasm: Secondary | ICD-10-CM

## 2016-05-06 MED FILL — COLCHICINE 0.6 MG TABLET: 0.6 | 15 days supply | Qty: 30 | Fill #1

## 2016-05-06 MED FILL — ?CYCLOBENZAPRINE 10 MG TABL: 10 | 10 days supply | Qty: 30 | Fill #0

## 2016-05-06 MED FILL — ALLOPURINOL 300 MG TABLET: 300 | 30 days supply | Qty: 30 | Fill #1

## 2016-05-07 ENCOUNTER — Encounter: Payer: Self-pay | Admitting: Podiatry

## 2016-05-07 ENCOUNTER — Ambulatory Visit: Payer: No Typology Code available for payment source | Admitting: Podiatry

## 2016-05-07 VITALS — BP 117/91 | HR 92 | Resp 16

## 2016-05-07 DIAGNOSIS — Z9889 Other specified postprocedural states: Secondary | ICD-10-CM

## 2016-05-07 DIAGNOSIS — M2041 Other hammer toe(s) (acquired), right foot: Secondary | ICD-10-CM

## 2016-05-07 MED ORDER — OXYCODONE-ACETAMINOPHEN 5-325 MG PO TABS: 1.0000 | ORAL_TABLET | Freq: Three times a day (TID) | ORAL | 0 refills | Status: DC | PRN

## 2016-05-09 ENCOUNTER — Telehealth: Payer: Self-pay | Admitting: Sports Medicine

## 2016-05-09 NOTE — Telephone Encounter (Signed)
The pin can not be removed. It's imbedded in bone and is in good position and does not need to be removed. Patient every week has pain issues from noncompliance with excessive standing and walking. I will see him as scheduled next week. Have him splint the toe with coban, rest, ice, and elevate and take tylenol or motrin only for pain. No narcotics. Thanks Dr. Cannon Kettle

## 2016-05-09 NOTE — Telephone Encounter (Signed)
Pt called and left voicemail when I called him back he asked if at his next appointment next week if you could [facility] Benign Lesion 1.0cm 2nd Rt; Hammertoe Repair 2nd Rt). He said it is causing him shooting pains up his leg into his knee. I put it in the office visit note

## 2016-05-09 NOTE — Telephone Encounter (Signed)
Thanks

## 2016-05-10 NOTE — Progress Notes (Signed)
   Subjective: Patient is a 51 year old male seen in office today for a follow-up visit after second hammertoe repair of the right foot DOS 03/10/16. He states his pain is worse despite icing the foot and elevating it. He states wearing his boot helps to alleviate the pain somewhat. He states he is having sharp pains that start in the right foot and radiate to his knee beginning last week. He is requesting a refill on his Percocet prescribed by Dr. Cannon Kettle on 04/22/16 #20.    Objective/Physical Exam General: The patient is alert and oriented x3 in no acute distress.  Dermatology: Skin is warm, dry and supple bilateral lower extremities. Incision well healed at surgical site at 2nd toe, moderate swelling to right 2nd toe, no erythema, no warmth, no drainage, no signs of infection noted.   Vascular: Palpable pedal pulses bilaterally. No edema or erythema noted. Capillary refill within normal limits.  Neurological: Epicritic and protective threshold grossly intact bilaterally.   Musculoskeletal Exam: No pain or crepitus with range of motion right foot except at surgical site. No pain with calf compression.   Radiographic Exam:  Normal osseous mineralization. Joint spaces preserved. No fracture/dislocation/boney destruction.    Assessment: #1 s/p hammertoe repair of second toe of right foot  Plan of Care:  #1 Patient was evaluated. #2 prescription for Percocet #3 return to clinic to see Dr. Rickard Rhymes, DPM [facility]  Dr. Edrick Kins, [address]                                        [place], [place] [address]                Office [phone]  Fax [phone]

## 2016-05-13 ENCOUNTER — Ambulatory Visit: Payer: Self-pay | Attending: Family Medicine | Admitting: Family Medicine

## 2016-05-13 ENCOUNTER — Ambulatory Visit: Payer: No Typology Code available for payment source

## 2016-05-13 ENCOUNTER — Encounter: Payer: Self-pay | Admitting: Family Medicine

## 2016-05-13 ENCOUNTER — Ambulatory Visit (INDEPENDENT_AMBULATORY_CARE_PROVIDER_SITE_OTHER): Payer: Self-pay | Admitting: Sports Medicine

## 2016-05-13 VITALS — BP 125/86 | HR 81 | Temp 97.9°F | Ht 69.0 in | Wt 231.2 lb

## 2016-05-13 DIAGNOSIS — E0800 Diabetes mellitus due to underlying condition with hyperosmolarity without nonketotic hyperglycemic-hyperosmolar coma (NKHHC): Secondary | ICD-10-CM

## 2016-05-13 DIAGNOSIS — N528 Other male erectile dysfunction: Secondary | ICD-10-CM

## 2016-05-13 DIAGNOSIS — E119 Type 2 diabetes mellitus without complications: Secondary | ICD-10-CM | POA: Insufficient documentation

## 2016-05-13 DIAGNOSIS — M545 Low back pain, unspecified: Secondary | ICD-10-CM

## 2016-05-13 DIAGNOSIS — M2041 Other hammer toe(s) (acquired), right foot: Secondary | ICD-10-CM

## 2016-05-13 DIAGNOSIS — B351 Tinea unguium: Secondary | ICD-10-CM

## 2016-05-13 DIAGNOSIS — N529 Male erectile dysfunction, unspecified: Secondary | ICD-10-CM | POA: Insufficient documentation

## 2016-05-13 DIAGNOSIS — Z9889 Other specified postprocedural states: Secondary | ICD-10-CM

## 2016-05-13 DIAGNOSIS — E870 Hyperosmolality and hypernatremia: Secondary | ICD-10-CM | POA: Insufficient documentation

## 2016-05-13 DIAGNOSIS — I10 Essential (primary) hypertension: Secondary | ICD-10-CM | POA: Insufficient documentation

## 2016-05-13 DIAGNOSIS — M549 Dorsalgia, unspecified: Secondary | ICD-10-CM | POA: Insufficient documentation

## 2016-05-13 DIAGNOSIS — Z1211 Encounter for screening for malignant neoplasm of colon: Secondary | ICD-10-CM

## 2016-05-13 DIAGNOSIS — M109 Gout, unspecified: Secondary | ICD-10-CM | POA: Insufficient documentation

## 2016-05-13 DIAGNOSIS — Z885 Allergy status to narcotic agent status: Secondary | ICD-10-CM | POA: Insufficient documentation

## 2016-05-13 DIAGNOSIS — G8929 Other chronic pain: Secondary | ICD-10-CM | POA: Insufficient documentation

## 2016-05-13 LAB — GLUCOSE, POCT (MANUAL RESULT ENTRY): POC Glucose: 92 mg/dl (ref 70–99)

## 2016-05-13 MED ORDER — SILDENAFIL CITRATE 50 MG PO TABS: 50.0000 mg | ORAL_TABLET | Freq: Every day | ORAL | 1 refills | Status: DC | PRN

## 2016-05-13 MED ORDER — TERBINAFINE HCL 250 MG PO TABS: 250.0000 mg | ORAL_TABLET | Freq: Every day | ORAL | 0 refills | Status: DC

## 2016-05-13 MED ORDER — ACETAMINOPHEN-CODEINE #3 300-30 MG PO TABS: 1.0000 | ORAL_TABLET | Freq: Two times a day (BID) | ORAL | 0 refills | Status: DC | PRN

## 2016-05-13 MED FILL — TERBINAFINE HCL 250 MG TAB: 250 | 30 days supply | Qty: 30 | Fill #0

## 2016-05-13 MED FILL — ACETAMINOPHEN/COD #3 TABLET: 300-30 | 30 days supply | Qty: 60 | Fill #0

## 2016-05-13 NOTE — Progress Notes (Addendum)
Subjective: Terry Poole is a 51 y.o. male patient seen today in office for POV #6 (DOS 03-10-16), S/P right excision of lesion and 2nd hammertoe repair. Patient admits pain at surgical site, that is decreased now had an episode of pain at knee, had refill on pain medication given at last visit by Dr. Amalia Hailey,  denies calf pain, denies headache, chest pain, shortness of breath, nausea, vomiting, fever, or chills. No other issues noted.   Desires to start antifungal medication.   Patient Active Problem List   Diagnosis Date Noted  . Back pain 05/13/2016  . Hammertoe of second toe of right foot 03/06/2016  . Onychomycosis 04/16/2015  . 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 Gout attack 03/28/2015    Current Outpatient Prescriptions on File Prior to Visit  Medication Sig Dispense Refill  . acetaminophen-codeine (TYLENOL #3) 300-30 MG tablet Take 1 tablet by mouth every 12 (twelve) hours as needed for moderate pain. 60 tablet 0  . allopurinol (ZYLOPRIM) 300 MG tablet Take 1 tablet (300 mg total) by mouth daily. 30 tablet 3  . colchicine 0.6 MG tablet Take 2 tabs (1.2mg ) at the onset of a gout attack, may repeat 1 tab (0.6mg ) after 1 hour if symptoms persist. 90 tablet 3  . cyclobenzaprine (FLEXERIL) 10 MG tablet TAKE 1 TABLET BY MOUTH 3 TIMES DAILY AS NEEDED FOR MUSCLE SPASMS. 30 tablet 0  . miconazole (LOTRIMIN AF) 2 % powder Apply topically as needed for itching. Apply to feet BID as needed. (Patient not taking: Reported on 05/13/2016) 70 g 0  . promethazine (PHENERGAN) 12.5 MG tablet Take 1 tablet (12.5 mg total) by mouth every 6 (six) hours as needed for nausea or vomiting. (Patient not taking: Reported on 05/13/2016) 30 tablet 0  . sildenafil (VIAGRA) 50 MG tablet Take 1 tablet (50 mg total) by mouth daily as needed for erectile dysfunction. At least 24 hours between doses. 10 tablet 1   No current facility-administered medications on file prior to visit.     Allergies  Allergen Reactions  .  Tramadol Hives  . Vicodin [Hydrocodone-Acetaminophen] Hives    Objective: There were no vitals filed for this visit.  General: No acute distress, AAOx3   Righ foot: Incision well healed at surgical site at 2nd toe, moderate swelling to right 2nd toe, no erythema, no warmth, no drainage, no signs of infection noted, Capillary fill time <3 seconds in all digits, gross sensation present via light touch to right foot. No pain or crepitation with range of motion right foot. No pain with calf compression.   Nails x 10 thickened and resembling mycosis.   Assessment and Plan:  Problem List Items Addressed This Visit    None    Visit Diagnoses    Dermatophytosis of nail    -  Primary   Relevant Medications   terbinafine (LAMISIL) 250 MG tablet   S/P foot surgery, right       Hammer toe of right foot          -Patient seen and evaluated -Advised patient to have his knee checked by ortho doctor  -Continue with coban toe splint to assist with edema control to toe and advised patient to do the same -Advised patient to continue with post-op shoe on right foot slowly weaning to normal tennis shoe  -Advised patient to limit activity to necessity to assist with edema control  -Advised patient to ice and elevate as necessary -No pain medications will be Rx to patient; Advised patient  to take motrin or tylenol.  -Rx Lamisil to take 1 tab daily as instructed  -Return to work with no restrictions Jun 04, 2016.  -Will plan for lamisil med check and ordering LFTs at next office visit.   Landis Martins, DPM

## 2016-05-13 NOTE — Progress Notes (Signed)
Subjective:  Patient ID: Terry Poole, male    DOB: Sep 27, 1965  Age: 51 y.o. MRN: [id number]  CC: Diabetes; Hypertension; kidney function test; and gi referral (for colonoscopy)   HPI Terry Poole is a 51 year old male with a history of type 2 diabetes mellitus (A1c 6.3, diet controlled) gout, recent second right hammertoe correction who presents today for follow-up on elevated blood pressure. His blood pressure was elevated at his last visit with no prior diagnosis of hypertension but it is controlled today. He had a visit to his foot specialist today with recommendations to switch from his boot to regular shoes.  He had been concerned about erectile problems and blood work revealed normal testosterone level. He is interested in receiving Viagra.  He complains of low back pain which alternates from the left to the right side but is currently on the left side. Rates this as an 8/10 with no radiation of pain; takes Flexeril which does not help.  Past Medical History:  Diagnosis Date  . Bronchitis   . Bronchitis   . Gout   . Hammertoe of second toe of right foot   . Smoker     Past Surgical History:  Procedure Laterality Date  . HAMMER TOE SURGERY Right 03/10/2016   Procedure: 2ND RIGHT HAMMER TOE CORRECTION;  Surgeon: Landis Martins, DPM;  Location: [facility];  Service: Podiatry;  Laterality: Right;  . MASS EXCISION Right 03/10/2016   Procedure: EXCISION OF CALLUS 2ND RIGHT TOE;  Surgeon: Landis Martins, DPM;  Location: [facility];  Service: Podiatry;  Laterality: Right;  . NO PAST SURGERIES      Allergies  Allergen Reactions  . Tramadol Hives  . Vicodin [Hydrocodone-Acetaminophen] Hives     Outpatient Medications Prior to Visit  Medication Sig Dispense Refill  . allopurinol (ZYLOPRIM) 300 MG tablet Take 1 tablet (300 mg total) by mouth daily. 30 tablet 3  . colchicine 0.6 MG tablet Take 2 tabs (1.81m) at the onset of a gout attack, may repeat 1  tab (0.647m after 1 hour if symptoms persist. 90 tablet 3  . cyclobenzaprine (FLEXERIL) 10 MG tablet TAKE 1 TABLET BY MOUTH 3 TIMES DAILY AS NEEDED FOR MUSCLE SPASMS. 30 tablet 0  . miconazole (LOTRIMIN AF) 2 % powder Apply topically as needed for itching. Apply to feet BID as needed. (Patient not taking: Reported on 05/13/2016) 70 g 0  . promethazine (PHENERGAN) 12.5 MG tablet Take 1 tablet (12.5 mg total) by mouth every 6 (six) hours as needed for nausea or vomiting. (Patient not taking: Reported on 05/13/2016) 30 tablet 0  . terbinafine (LAMISIL) 250 MG tablet Take 1 tablet (250 mg total) by mouth daily. (Patient not taking: Reported on 05/13/2016) 90 tablet 0  . oxyCODONE-acetaminophen (ROXICET) 5-325 MG tablet Take 1 tablet by mouth every 8 (eight) hours as needed for severe pain. (Patient not taking: Reported on 05/13/2016) 21 tablet 0  . oxyCODONE-acetaminophen (ROXICET) 5-325 MG tablet Take 1 tablet by mouth every 8 (eight) hours as needed for severe pain. (Patient not taking: Reported on 05/13/2016) 20 tablet 0   No facility-administered medications prior to visit.     ROS Review of Systems  Constitutional: Negative for activity change and appetite change.  HENT: Negative for sinus pressure and sore throat.   Eyes: Negative for visual disturbance.  Respiratory: Negative for cough, chest tightness and shortness of breath.   Cardiovascular: Negative for chest pain and leg swelling.  Gastrointestinal: Negative for abdominal distention,  abdominal pain, constipation and diarrhea.  Endocrine: Negative.   Genitourinary: Negative for dysuria.  Musculoskeletal: Positive for back pain. Negative for joint swelling and myalgias.  Skin: Negative for rash.  Allergic/Immunologic: Negative.   Neurological: Negative for weakness, light-headedness and numbness.  Psychiatric/Behavioral: Negative for dysphoric mood and suicidal ideas.    Objective:  BP 125/86 (BP Location: Right Arm, Patient Position:  Sitting, Cuff Size: Small)   Pulse 81   Temp 97.9 F (36.6 C) (Oral)   Ht '5\' 9"'  (1.753 m)   Wt 231 lb 3.2 oz (104.9 kg)   SpO2 98%   BMI 34.14 kg/m   BP/Weight 05/13/2016 0/04/5595 05/05/6382  Systolic BP 536 468 032  Diastolic BP 86 91 91  Wt. (Lbs) 231.2 - 226.6  BMI 34.14 - 32.98      Physical Exam Constitutional: He is oriented to person, place, and time. He appears well-developed and well-nourished.  Cardiovascular: Normal rate, normal heart sounds and intact distal pulses.   No murmur heard. Pulmonary/Chest: Effort normal and breath sounds normal. He has no wheezes. He has no rales. He exhibits no tenderness.  Abdominal: Soft. Bowel sounds are normal. He exhibits no distension and no mass. There is no tenderness.  Musculoskeletal:  Right foot in a boot Neurological: He is alert and oriented to person, place, and time.  Skin: Skin is warm.  Psychiatric: normal   Lab Results  Component Value Date   HGBA1C 6.3 03/06/2016    Assessment & Plan:   1. Diabetes mellitus due to underlying condition with hyperosmolarity without coma, without long-term current use of insulin (HCC) Diet controlled with A1c of 6.3 - Glucose (CBG) - CMP14+EGFR - Lipid panel - Microalbumin / creatinine urine ratio  2. Other male erectile dysfunction Normal testosterone - sildenafil (VIAGRA) 50 MG tablet; Take 1 tablet (50 mg total) by mouth daily as needed for erectile dysfunction. At least 24 hours between doses.  Dispense: 10 tablet; Refill: 1  3. Screening for colon cancer - Ambulatory referral to Gastroenterology  4. Chronic left-sided low back pain without sciatica Could be secondary to muscle spasm  Advised to apply heat Continue with Flexeril He is requesting Vicodin which we do not prescribe and clinic; chart reveals he is allergic to Vicodin and tramadol however he would like to try it again I have offered him Tylenol 3 and he knows to take Benadryl and stop Tylenol No. 3 in the  event that he develops hives. - acetaminophen-codeine (TYLENOL #3) 300-30 MG tablet; Take 1 tablet by mouth every 12 (twelve) hours as needed for moderate pain.  Dispense: 60 tablet; Refill: 0   Meds ordered this encounter  Medications  . acetaminophen-codeine (TYLENOL #3) 300-30 MG tablet    Sig: Take 1 tablet by mouth every 12 (twelve) hours as needed for moderate pain.    Dispense:  60 tablet    Refill:  0  . sildenafil (VIAGRA) 50 MG tablet    Sig: Take 1 tablet (50 mg total) by mouth daily as needed for erectile dysfunction. At least 24 hours between doses.    Dispense:  10 tablet    Refill:  1    Follow-up: Return in about 3 months (around 08/12/2016) for Follow-up of chronic medical conditions.   Arnoldo Morale MD

## 2016-05-14 ENCOUNTER — Other Ambulatory Visit: Payer: Self-pay | Admitting: Family Medicine

## 2016-05-14 LAB — CMP14+EGFR
A/G RATIO: 1.6 (ref 1.2–2.2)
ALT: 78 IU/L — AB (ref 0–44)
AST: 39 IU/L (ref 0–40)
Albumin: 4.5 g/dL (ref 3.5–5.5)
Alkaline Phosphatase: 102 IU/L (ref 39–117)
BUN/Creatinine Ratio: 7 — ABNORMAL LOW (ref 9–20)
BUN: 7 mg/dL (ref 6–24)
Bilirubin Total: 0.4 mg/dL (ref 0.0–1.2)
CALCIUM: 9.8 mg/dL (ref 8.7–10.2)
CO2: 25 mmol/L (ref 18–29)
CREATININE: 0.94 mg/dL (ref 0.76–1.27)
Chloride: 100 mmol/L (ref 96–106)
GFR, EST AFRICAN AMERICAN: 109 mL/min/{1.73_m2} (ref >59)
GFR, EST NON AFRICAN AMERICAN: 94 mL/min/{1.73_m2} (ref >59)
Globulin, Total: 2.8 g/dL (ref 1.5–4.5)
Glucose: 92 mg/dL (ref 65–99)
Potassium: 4.2 mmol/L (ref 3.5–5.2)
Sodium: 141 mmol/L (ref 134–144)
Total Protein: 7.3 g/dL (ref 6.0–8.5)

## 2016-05-14 LAB — LIPID PANEL
CHOLESTEROL TOTAL: 218 mg/dL — AB (ref 100–199)
Chol/HDL Ratio: 3.8 ratio (ref 0.0–5.0)
HDL: 57 mg/dL (ref >39)
LDL CALC: 122 mg/dL — AB (ref 0–99)
TRIGLYCERIDES: 193 mg/dL — AB (ref 0–149)
VLDL Cholesterol Cal: 39 mg/dL (ref 5–40)

## 2016-05-14 LAB — MICROALBUMIN / CREATININE URINE RATIO
Creatinine, Urine: 125.7 mg/dL
MICROALB/CREAT RATIO: 14.1 mg/g{creat} (ref 0.0–30.0)
Microalbumin, Urine: 17.7 ug/mL

## 2016-05-14 MED ORDER — ATORVASTATIN CALCIUM 20 MG PO TABS: 20.0000 mg | ORAL_TABLET | Freq: Every day | ORAL | 3 refills | Status: DC

## 2016-05-15 MED FILL — ?ATORVASTATIN 20 MG TABLET: 20 | 30 days supply | Qty: 30 | Fill #0

## 2016-05-16 ENCOUNTER — Other Ambulatory Visit: Payer: Self-pay

## 2016-05-16 DIAGNOSIS — N528 Other male erectile dysfunction: Secondary | ICD-10-CM

## 2016-05-16 MED ORDER — SILDENAFIL CITRATE 50 MG PO TABS: 50.0000 mg | ORAL_TABLET | Freq: Every day | ORAL | 1 refills | Status: DC | PRN

## 2016-05-19 ENCOUNTER — Other Ambulatory Visit: Payer: Self-pay | Admitting: *Deleted

## 2016-05-19 DIAGNOSIS — N528 Other male erectile dysfunction: Secondary | ICD-10-CM

## 2016-05-19 MED ORDER — SILDENAFIL CITRATE 50 MG PO TABS: 50.0000 mg | ORAL_TABLET | Freq: Every day | ORAL | 1 refills | Status: DC | PRN

## 2016-05-19 MED FILL — !VIAGRA 50 MG TABLET: 50 MG | 30 days supply | Qty: 5 | Fill #0

## 2016-05-19 NOTE — Telephone Encounter (Signed)
Patients medication was reordered due to transfer being unsuccessful.

## 2016-05-23 ENCOUNTER — Encounter: Payer: Self-pay | Admitting: *Deleted

## 2016-05-23 NOTE — Telephone Encounter (Signed)
Thanks he can return 05-26-16. No restrictions

## 2016-05-30 ENCOUNTER — Other Ambulatory Visit: Payer: Self-pay

## 2016-05-30 DIAGNOSIS — N528 Other male erectile dysfunction: Secondary | ICD-10-CM

## 2016-05-30 MED ORDER — SILDENAFIL CITRATE 50 MG PO TABS: 50.0000 mg | ORAL_TABLET | Freq: Every day | ORAL | 0 refills | Status: DC | PRN

## 2016-06-06 ENCOUNTER — Other Ambulatory Visit: Payer: Self-pay | Admitting: Family Medicine

## 2016-06-06 DIAGNOSIS — M62838 Other muscle spasm: Secondary | ICD-10-CM

## 2016-06-06 MED FILL — TERBINAFINE HCL 250 MG TAB: 250 | 30 days supply | Qty: 30 | Fill #1

## 2016-06-06 MED FILL — ATORVASTATIN 20 MG TABLET: 20 | 30 days supply | Qty: 30 | Fill #1

## 2016-06-09 MED FILL — COLCHICINE 0.6 MG TABLET: 0.6 | 14 days supply | Qty: 28 | Fill #1

## 2016-06-09 MED FILL — CYCLOBENZAPRINE 10 MG TAB: 10 | 10 days supply | Qty: 30 | Fill #0

## 2016-06-12 ENCOUNTER — Encounter: Payer: Self-pay | Admitting: Family Medicine

## 2016-06-12 MED FILL — !VIAGRA 50 MG TABLET: 50 | 15 days supply | Qty: 5 | Fill #0

## 2016-06-19 ENCOUNTER — Telehealth: Payer: Self-pay | Admitting: Family Medicine

## 2016-06-19 ENCOUNTER — Other Ambulatory Visit: Payer: Self-pay | Admitting: Family Medicine

## 2016-06-19 DIAGNOSIS — M1000 Idiopathic gout, unspecified site: Secondary | ICD-10-CM

## 2016-06-19 MED ORDER — AMOXICILLIN 500 MG PO CAPS: 500.0000 mg | ORAL_CAPSULE | Freq: Three times a day (TID) | ORAL | 0 refills | Status: DC

## 2016-06-19 MED FILL — AMOXICILLIN 500 MG CAPSULE: 500 | 10 days supply | Qty: 30 | Fill #0

## 2016-06-19 NOTE — Telephone Encounter (Signed)
Pt calling to request a script for a sinus infection. States he missed work because of it. Please f/u.

## 2016-06-19 NOTE — Telephone Encounter (Signed)
Antibiotics sent to his pharmacy.

## 2016-06-19 NOTE — Telephone Encounter (Signed)
Will defer to Dr. Jarold Song.

## 2016-06-23 MED FILL — $Viagra 50mg tablet: 50 | 30 days supply | Qty: 10 | Fill #1

## 2016-06-24 ENCOUNTER — Ambulatory Visit: Payer: No Typology Code available for payment source | Admitting: Sports Medicine

## 2016-07-03 ENCOUNTER — Other Ambulatory Visit: Payer: Self-pay | Admitting: Family Medicine

## 2016-07-03 DIAGNOSIS — M545 Low back pain: Principal | ICD-10-CM

## 2016-07-03 DIAGNOSIS — G8929 Other chronic pain: Secondary | ICD-10-CM

## 2016-07-03 MED FILL — $COLCRYS 0.6 MG TABLET: 0.6 | 30 days supply | Qty: 30 | Fill #0

## 2016-07-03 MED FILL — ATORVASTATIN 20 MG TABLET: 20 | 30 days supply | Qty: 30 | Fill #2

## 2016-07-03 MED FILL — TERBINAFINE HCL 250 MG TAB: 250 | 30 days supply | Qty: 30 | Fill #2

## 2016-07-04 NOTE — Addendum Note (Signed)
Addendum  created 07/04/16 1004 by Effie Berkshire, MD   Sign clinical note

## 2016-07-07 ENCOUNTER — Ambulatory Visit: Payer: Self-pay | Admitting: Family Medicine

## 2016-07-08 ENCOUNTER — Encounter: Payer: Self-pay | Admitting: Sports Medicine

## 2016-07-08 ENCOUNTER — Ambulatory Visit: Payer: No Typology Code available for payment source

## 2016-07-08 ENCOUNTER — Ambulatory Visit: Payer: Self-pay | Admitting: Sports Medicine

## 2016-07-08 ENCOUNTER — Ambulatory Visit (INDEPENDENT_AMBULATORY_CARE_PROVIDER_SITE_OTHER): Payer: Self-pay

## 2016-07-08 DIAGNOSIS — R52 Pain, unspecified: Secondary | ICD-10-CM

## 2016-07-08 DIAGNOSIS — B351 Tinea unguium: Secondary | ICD-10-CM

## 2016-07-08 DIAGNOSIS — Z9889 Other specified postprocedural states: Secondary | ICD-10-CM

## 2016-07-08 DIAGNOSIS — M722 Plantar fascial fibromatosis: Secondary | ICD-10-CM

## 2016-07-08 MED ORDER — TRIAMCINOLONE ACETONIDE 10 MG/ML IJ SUSP: 10.0000 mg | Freq: Once | INTRAMUSCULAR | Status: DC

## 2016-07-08 NOTE — Progress Notes (Signed)
Subjective: Terry Poole is a 51 y.o. male patient seen today in office for POV #7 (DOS 03-10-16), S/P right excision of lesion and 2nd hammertoe repair. Patient states that pain on right is better now new pain at heel and arch on left. No issues with lamisil. No other issues noted.   Patient Active Problem List   Diagnosis Date Noted  . Back pain 05/13/2016  . Hammertoe of second toe of right foot 03/06/2016  . Onychomycosis 04/16/2015  . Pseudofolliculitis barbae 16/02/930  . Gout attack 03/28/2015    Current Outpatient Prescriptions on File Prior to Visit  Medication Sig Dispense Refill  . acetaminophen-codeine (TYLENOL #3) 300-30 MG tablet TAKE 1 TABLET BY MOUTH EVERY 12 HOURS AS NEEDED FOR MODERATE PAIN 60 tablet 0  . allopurinol (ZYLOPRIM) 300 MG tablet Take 1 tablet (300 mg total) by mouth daily. 30 tablet 3  . amoxicillin (AMOXIL) 500 MG capsule Take 1 capsule (500 mg total) by mouth 3 (three) times daily. 30 capsule 0  . atorvastatin (LIPITOR) 20 MG tablet Take 1 tablet (20 mg total) by mouth daily. 30 tablet 3  . colchicine 0.6 MG tablet Take 2 tabs (1.2mg ) at the onset of a gout attack, may repeat 1 tab (0.6mg ) after 1 hour if symptoms persist. 90 tablet 3  . colchicine 0.6 MG tablet TAKE 2 TABS BY MOUTH AT THE ONSET OF A GOUT ATTACK, MAY REPEAT 1 TAB AFTER 1 HOUR IF SYMPTOMS PERSIST. 30 tablet 1  . cyclobenzaprine (FLEXERIL) 10 MG tablet TAKE 1 TABLET BY MOUTH 3 TIMES DAILY AS NEEDED FOR MUSCLE SPASMS. 30 tablet 0  . miconazole (LOTRIMIN AF) 2 % powder Apply topically as needed for itching. Apply to feet BID as needed. (Patient not taking: Reported on 05/13/2016) 70 g 0  . promethazine (PHENERGAN) 12.5 MG tablet Take 1 tablet (12.5 mg total) by mouth every 6 (six) hours as needed for nausea or vomiting. (Patient not taking: Reported on 05/13/2016) 30 tablet 0  . sildenafil (VIAGRA) 50 MG tablet Take 1 tablet (50 mg total) by mouth daily as needed for erectile dysfunction. At least  24 hours between doses. 30 tablet 0  . terbinafine (LAMISIL) 250 MG tablet Take 1 tablet (250 mg total) by mouth daily. (Patient not taking: Reported on 05/13/2016) 90 tablet 0   No current facility-administered medications on file prior to visit.     Allergies  Allergen Reactions  . Tramadol Hives  . Vicodin [Hydrocodone-Acetaminophen] Hives    Objective: There were no vitals filed for this visit.  General: No acute distress, AAOx3   Righ foot: Incision well healed at surgical site at 2nd toe, decreased swelling to right 2nd toe, no erythema, no warmth, no drainage, no signs of infection noted, Capillary fill time <3 seconds in all digits, gross sensation present via light touch to right foot. No pain or crepitation with range of motion right foot. No pain with calf compression.    Nails x 10 thickened and resembling mycosis no proximal clearing yet.  Left foot: Pain with palpation at medial insertion of plantar fascia. No other acute symptoms.   Xrays right 2nd toe kwire retained with acceptable alignment of toe, inferior heel spur on left, pes planus bilateral    Assessment and Plan:  Problem List Items Addressed This Visit    None    Visit Diagnoses    Plantar fasciitis, left    -  Primary   Relevant Medications   triamcinolone acetonide (KENALOG) 10 MG/ML  injection 10 mg   S/P foot surgery, right       Relevant Orders   DG Foot Complete Right   Pain       Relevant Orders   DG Foot Complete Left      -Patient seen and evaluated -Continue with good supportive shoes and work with no restrictions -Advised OTC orthotics since custom orthotics will not be covered -Ordered new set of LFTs to check liver since on Lamisil for nail fungus; will call with results  -After oral consent and aseptic prep, injected a mixture containing 1 ml of 2%  plain lidocaine, 1 ml 0.5% plain marcaine, 0.5 ml of kenalog 10 and 0.5 ml of dexamethasone phosphate into Left heel without  complication. Post-injection care discussed with patient.  -Recommend stretching, icing, and good supportive shoes -Advised patient can not give pain medication for any pain in foot; may take Tylenol -Return PRN or sooner if problems or issues arise   Landis Martins, DPM

## 2016-07-09 ENCOUNTER — Ambulatory Visit (HOSPITAL_COMMUNITY)
Admission: EM | Admit: 2016-07-09 | Discharge: 2016-07-09 | Disposition: A | Discharge status: Home / Self Care | Payer: Self-pay | Attending: Internal Medicine | Admitting: Internal Medicine

## 2016-07-09 ENCOUNTER — Encounter (HOSPITAL_COMMUNITY): Payer: Self-pay | Admitting: Family Medicine

## 2016-07-09 DIAGNOSIS — M62838 Other muscle spasm: Secondary | ICD-10-CM

## 2016-07-09 DIAGNOSIS — M25561 Pain in right knee: Secondary | ICD-10-CM

## 2016-07-09 DIAGNOSIS — M542 Cervicalgia: Secondary | ICD-10-CM

## 2016-07-09 MED ORDER — KETOROLAC TROMETHAMINE 60 MG/2ML IM SOLN
INTRAMUSCULAR | Status: AC
  Filled 2016-07-09: qty 2

## 2016-07-09 MED ORDER — ORPHENADRINE CITRATE ER 100 MG PO TB12: 100.0000 mg | ORAL_TABLET | Freq: Two times a day (BID) | ORAL | 0 refills | Status: DC | PRN

## 2016-07-09 MED ORDER — KETOROLAC TROMETHAMINE 60 MG/2ML IM SOLN
60.0000 mg | Freq: Once | INTRAMUSCULAR | Status: AC
  Administered 2016-07-09: 60 mg via INTRAMUSCULAR

## 2016-07-09 MED ORDER — ACETAMINOPHEN-CODEINE #3 300-30 MG PO TABS: 1.0000 | ORAL_TABLET | Freq: Three times a day (TID) | ORAL | 0 refills | Status: DC | PRN

## 2016-07-09 NOTE — ED Triage Notes (Signed)
Pt here for left shoulder pain that started yesterday with movement. Denies injury. Denies heavy lifting. sts also some right knee pain.

## 2016-07-09 NOTE — ED Provider Notes (Signed)
CSN: [id number]     Arrival date & time 07/09/16  1000 History   First MD Initiated Contact with Patient 07/09/16 1127     Chief Complaint  Patient presents with  . Shoulder Pain  . Knee Pain   (Consider location/radiation/quality/duration/timing/severity/associated sxs/prior Treatment) 51 year old male presents with left shoulder/neck pain that started yesterday. Denies any distinct injury but was worse after moving equipment at work last night. Was unable to sleep due to pain. Has also had right knee pain intermittently for the past 2 months. No distinct injury of right knee. Has been told he has some arthritis in his knee in the past. Did not take anything for pain. Has history of foot pain- dx with plantar fascitis and saw the Podiatrist yesterday. Was given a shot of Kenalog in his foot. Also has history of gout- currently taking Colchicine. Has Rx for Allopurinol but not currently taking. Also on Lamisil for fungal infection. He was given Flexeril in the past for muscle spasms and has been taking it the past few days with no relief. When reviewing other medication he is taking- he is on Amoxicillin that was prescribed on 06/19/16 for sinus. He was suppose to take this 3 times a day for 10 days but still has 8 pills left- admits to stopping it and then starting it again a few days ago. He indicates that he has tried various NSAIDs for pain/arthritis which "do not work". He has taken Codeine before without a reaction. He appears to be slightly confused about what medication he should be taking and how often. His PCP is [facility] and he has an appointment in 2 weeks for routine check-up.    The history is provided by the patient.    Past Medical History:  Diagnosis Date  . Bronchitis   . Bronchitis   . Gout   . Hammertoe of second toe of right foot   . Smoker    Past Surgical History:  Procedure Laterality Date  . HAMMER TOE SURGERY Right 03/10/2016   Procedure: 2ND RIGHT  HAMMER TOE CORRECTION;  Surgeon: Landis Martins, DPM;  Location: [place];  Service: Podiatry;  Laterality: Right;  . MASS EXCISION Right 03/10/2016   Procedure: EXCISION OF CALLUS 2ND RIGHT TOE;  Surgeon: Landis Martins, DPM;  Location: [place];  Service: Podiatry;  Laterality: Right;  . NO PAST SURGERIES     History reviewed. No pertinent family history. Social History  Substance Use Topics  . Smoking status: Current Every Day Smoker    Packs/day: 0.25    Types: Cigarettes  . Smokeless tobacco: Never Used     Comment: 8 cigs daily  . Alcohol use No    Review of Systems  Constitutional: Negative for chills, diaphoresis, fatigue and fever.  HENT: Negative for postnasal drip, rhinorrhea, sinus pain, sinus pressure and sore throat.   Respiratory: Negative for cough, chest tightness, shortness of breath and wheezing.   Cardiovascular: Negative for chest pain.  Gastrointestinal: Negative for nausea and vomiting.  Musculoskeletal: Positive for arthralgias, myalgias and neck pain. Negative for back pain, joint swelling and neck stiffness.  Skin: Negative for rash and wound.  Neurological: Negative for dizziness, syncope, weakness, light-headedness, numbness and headaches.  Hematological: Negative for adenopathy.    Allergies  Tramadol and Vicodin [hydrocodone-acetaminophen]  Home Medications   Prior to Admission medications   Medication Sig Start Date End Date Taking? Authorizing Provider  acetaminophen-codeine (TYLENOL #3) 300-30 MG  tablet Take 1-2 tablets by mouth every 8 (eight) hours as needed for moderate pain. 07/09/16   Katy Apo, NP  allopurinol (ZYLOPRIM) 300 MG tablet Take 1 tablet (300 mg total) by mouth daily. 03/06/16   Arnoldo Morale, MD  amoxicillin (AMOXIL) 500 MG capsule Take 1 capsule (500 mg total) by mouth 3 (three) times daily. 06/19/16   Arnoldo Morale, MD  atorvastatin (LIPITOR) 20 MG tablet Take 1 tablet (20 mg total) by mouth  daily. 05/14/16   Arnoldo Morale, MD  colchicine 0.6 MG tablet TAKE 2 TABS BY MOUTH AT THE ONSET OF A GOUT ATTACK, MAY REPEAT 1 TAB AFTER 1 HOUR IF SYMPTOMS PERSIST. 06/19/16   Arnoldo Morale, MD  orphenadrine (NORFLEX) 100 MG tablet Take 1 tablet (100 mg total) by mouth 2 (two) times daily as needed for muscle spasms. 07/09/16   Katy Apo, NP  sildenafil (VIAGRA) 50 MG tablet Take 1 tablet (50 mg total) by mouth daily as needed for erectile dysfunction. At least 24 hours between doses. 05/30/16   Arnoldo Morale, MD  terbinafine (LAMISIL) 250 MG tablet Take 1 tablet (250 mg total) by mouth daily. Patient not taking: Reported on 05/13/2016 05/13/16   Landis Martins, DPM   Meds Ordered and Administered this Visit   Medications  ketorolac (TORADOL) injection 60 mg (60 mg Intramuscular Given 07/09/16 1216)    BP (!) 153/97   Pulse (!) 102   Temp 98.1 F (36.7 C)   Resp 18   SpO2 99%  No data found.   Physical Exam  Constitutional: He is oriented to person, place, and time. He appears well-developed and well-nourished. He is cooperative. He does not appear ill. No distress.  Patient resting comfortably on exam table.   HENT:  Head: Normocephalic and atraumatic.  Right Ear: External ear normal.  Left Ear: External ear normal.  Eyes: Conjunctivae and EOM are normal.  Neck: Normal range of motion. Neck supple. Muscular tenderness present. No neck rigidity. No edema and normal range of motion present.  Cardiovascular: Regular rhythm and normal heart sounds.  Tachycardia present.   Pulmonary/Chest: Effort normal.  Musculoskeletal: He exhibits tenderness.       Left shoulder: He exhibits decreased range of motion, tenderness, pain and spasm. He exhibits no swelling, no effusion, no crepitus, no deformity, no laceration, normal pulse and normal strength.       Right knee: He exhibits normal range of motion, no swelling, no effusion, no ecchymosis, no erythema, normal alignment, no LCL laxity,  normal patellar mobility and no MCL laxity. Tenderness found. Medial joint line tenderness noted.       Legs: Has decreased range of motion of shoulder/arm especially with rotation. Pain is mostly in trapezius muscle of left back/neck and travels to shoulder area. Muscle spasm present. No swelling, redness or warmth. Good distal pulses. No neuro deficits noted.  Right knee with full range of motion but slightly tender along medial side of patella. No distinct swelling present. Good distal pulses and no neuro deficits noted.    Lymphadenopathy:    He has no cervical adenopathy.  Neurological: He is alert and oriented to person, place, and time. He has normal strength. No sensory deficit.  Skin: Skin is warm and dry. No rash noted.  Psychiatric: He has a normal mood and affect. His speech is normal. Thought content normal. He is agitated.    Urgent Care Course     Procedures (including critical care time)  Labs Review Labs  Reviewed - No data to display  Imaging Review No results found.   Visual Acuity Review  Right Eye Distance:   Left Eye Distance:   Bilateral Distance:    Right Eye Near:   Left Eye Near:    Bilateral Near:         MDM   1. Muscle spasm of left shoulder area   2. Neck pain on left side   3. Acute pain of right knee    Reviewed with patient that he appears to be having muscle spasms and strain of his neck/shoulder muscles. Patient desires a "shot" for pain. Gave Toradol 60mg  IM now. Will trial Norflex 100mg  twice a day as needed for muscle spasms. Do not take any more Flexeril. Discussed prednisone use and patient refused. Discussed Narcotic pain medication. Reviewed [place] Controlled Substance Registry. Has been given Oxycodone with Tylenol - a Rx every week in Feb (4 times) and then 3 weeks in March by 2 different providers and then 7 day supply on 05-09-16. Then saw his most recent PCP on 05-13-16 and was given Tylenol #3. (#60- 30 day supply). This was his last  Rx which has lasted almost 60 days. Discussed that I will only give him a few Tylenol #3 for pain and muscle spasms that are not controlled with Norflex. Prescribed Tylenol #3 1-2 tablets every 8 hours as needed #10 no Refill. Reviewed that he needs to take medication as prescribed and to contact his PCP with any questions regarding how to take his long-term medications and which ones are only prn. Continue to monitor BP. Recommend follow-up with his PCP in 3 to 4 days if not improving.       Katy Apo, NP 07/09/16 [phone]

## 2016-07-09 NOTE — Discharge Instructions (Signed)
You were given a shot of Toradol today to help with pain. Recommend try Norflex twice a day as directed for muscle spasms in shoulder/neck. May take Tylenol #3 1 to 2 tablets every 8 hours as needed for severe pain. Recommend follow-up with your primary care provider in 3 to 4 days if not improving.

## 2016-07-09 NOTE — Telephone Encounter (Signed)
Pt. Called requesting to speak with his PCP regarding shoulder pain. Pt. States he has been prescribed medication but it is not helping him. Pt. Would like to be prescribed Lyrica. Please f/u with pt.

## 2016-08-04 ENCOUNTER — Other Ambulatory Visit: Payer: Self-pay | Admitting: Sports Medicine

## 2016-08-04 DIAGNOSIS — B351 Tinea unguium: Secondary | ICD-10-CM

## 2016-08-04 MED FILL — ?ATORVASTATIN 20 MG TABLET: 20 | 30 days supply | Qty: 30 | Fill #3

## 2016-08-04 MED FILL — $COLCRYS 0.6 MG TABLET: 0.6 | 30 days supply | Qty: 30 | Fill #1

## 2016-08-04 MED FILL — $Viagra 50mg tablet: 50 | 15 days supply | Qty: 5 | Fill #2

## 2016-08-11 MED FILL — ALLOPURINOL 300 MG TABLET: 300 | 30 days supply | Qty: 30 | Fill #2

## 2016-08-19 ENCOUNTER — Ambulatory Visit: Payer: Self-pay | Attending: Family Medicine | Admitting: Family Medicine

## 2016-08-19 ENCOUNTER — Encounter: Payer: Self-pay | Admitting: Family Medicine

## 2016-08-19 VITALS — BP 144/87 | HR 85 | Temp 98.3°F | Resp 18 | Ht 69.0 in | Wt 236.0 lb

## 2016-08-19 DIAGNOSIS — E7800 Pure hypercholesterolemia, unspecified: Secondary | ICD-10-CM

## 2016-08-19 DIAGNOSIS — E119 Type 2 diabetes mellitus without complications: Secondary | ICD-10-CM

## 2016-08-19 DIAGNOSIS — Z885 Allergy status to narcotic agent status: Secondary | ICD-10-CM | POA: Insufficient documentation

## 2016-08-19 DIAGNOSIS — R030 Elevated blood-pressure reading, without diagnosis of hypertension: Secondary | ICD-10-CM | POA: Insufficient documentation

## 2016-08-19 DIAGNOSIS — Z9889 Other specified postprocedural states: Secondary | ICD-10-CM | POA: Insufficient documentation

## 2016-08-19 DIAGNOSIS — E785 Hyperlipidemia, unspecified: Secondary | ICD-10-CM | POA: Insufficient documentation

## 2016-08-19 DIAGNOSIS — F17200 Nicotine dependence, unspecified, uncomplicated: Secondary | ICD-10-CM | POA: Insufficient documentation

## 2016-08-19 DIAGNOSIS — M1A00X0 Idiopathic chronic gout, unspecified site, without tophus (tophi): Secondary | ICD-10-CM

## 2016-08-19 DIAGNOSIS — M79672 Pain in left foot: Secondary | ICD-10-CM

## 2016-08-19 MED ORDER — GABAPENTIN 300 MG PO CAPS: 300.0000 mg | ORAL_CAPSULE | Freq: Two times a day (BID) | ORAL | 3 refills | Status: DC

## 2016-08-19 MED ORDER — ATORVASTATIN CALCIUM 20 MG PO TABS: 20.0000 mg | ORAL_TABLET | Freq: Every day | ORAL | 3 refills | Status: DC

## 2016-08-19 MED ORDER — COLCHICINE 0.6 MG PO TABS
ORAL_TABLET | ORAL | 3 refills | Status: DC
Start: 2016-08-19 — End: 2016-09-29

## 2016-08-19 MED ORDER — ALLOPURINOL 300 MG PO TABS: 300.0000 mg | ORAL_TABLET | Freq: Two times a day (BID) | ORAL | 3 refills | Status: DC

## 2016-08-19 MED FILL — GABAPENTIN 300 MG CAPSULE: 300 | 30 days supply | Qty: 60 | Fill #0

## 2016-08-19 NOTE — Progress Notes (Signed)
Subjective:  Patient ID: Terry Poole, male    DOB: 06-12-1965  Age: 51 y.o. MRN: [id number]  CC: Follow-up   HPI Terry Poole is a 51 year old male with a history of type 2 diabetes mellitus (A1c 6.3, diet controlled) gout, second right hammertoe correction who presents today for follow-up visit.  He endorses compliance with his gout medications but continues to complain of pain on the dorsum of his left foot with associated throbbing and also pain on the sole of his foot. He also complains of occasional pins and needle sensation which occurr in his neck and shoulder. He has not been too compliant with a low purine eating plan. Requests a refill of Tylenol No. 3 for pain and when asked about which pain he replies "for any pain"  He has been taking his Lipitor which he tolerates without any side effects.  His blood pressure slightly elevated and he has no known history of hypertension.  Past Medical History:  Diagnosis Date  . Bronchitis   . Bronchitis   . Gout   . Hammertoe of second toe of right foot   . Smoker     Past Surgical History:  Procedure Laterality Date  . HAMMER TOE SURGERY Right 03/10/2016   Procedure: 2ND RIGHT HAMMER TOE CORRECTION;  Surgeon: Landis Martins, DPM;  Location: [place];  Service: Podiatry;  Laterality: Right;  . MASS EXCISION Right 03/10/2016   Procedure: EXCISION OF CALLUS 2ND RIGHT TOE;  Surgeon: Landis Martins, DPM;  Location: [place];  Service: Podiatry;  Laterality: Right;  . NO PAST SURGERIES      Allergies  Allergen Reactions  . Tramadol Hives  . Vicodin [Hydrocodone-Acetaminophen] Hives     Outpatient Medications Prior to Visit  Medication Sig Dispense Refill  . acetaminophen-codeine (TYLENOL #3) 300-30 MG tablet Take 1-2 tablets by mouth every 8 (eight) hours as needed for moderate pain. 10 tablet 0  . orphenadrine (NORFLEX) 100 MG tablet Take 1 tablet (100 mg total) by mouth 2 (two) times daily as  needed for muscle spasms. 20 tablet 0  . sildenafil (VIAGRA) 50 MG tablet Take 1 tablet (50 mg total) by mouth daily as needed for erectile dysfunction. At least 24 hours between doses. 30 tablet 0  . terbinafine (LAMISIL) 250 MG tablet Take 1 tablet (250 mg total) by mouth daily. 90 tablet 0  . allopurinol (ZYLOPRIM) 300 MG tablet Take 1 tablet (300 mg total) by mouth daily. 30 tablet 3  . atorvastatin (LIPITOR) 20 MG tablet Take 1 tablet (20 mg total) by mouth daily. 30 tablet 3  . colchicine 0.6 MG tablet TAKE 2 TABS BY MOUTH AT THE ONSET OF A GOUT ATTACK, MAY REPEAT 1 TAB AFTER 1 HOUR IF SYMPTOMS PERSIST. 30 tablet 1  . amoxicillin (AMOXIL) 500 MG capsule Take 1 capsule (500 mg total) by mouth 3 (three) times daily. 30 capsule 0   Facility-Administered Medications Prior to Visit  Medication Dose Route Frequency Provider Last Rate Last Dose  . triamcinolone acetonide (KENALOG) 10 MG/ML injection 10 mg  10 mg Other Once Landis Martins, DPM        ROS Review of Systems  Constitutional: Negative for activity change and appetite change.  HENT: Negative for sinus pressure and sore throat.   Eyes: Negative for visual disturbance.  Respiratory: Negative for cough, chest tightness and shortness of breath.   Cardiovascular: Negative for chest pain and leg swelling.  Gastrointestinal: Negative for abdominal distention, abdominal pain,  constipation and diarrhea.  Endocrine: Negative.   Genitourinary: Negative for dysuria.  Musculoskeletal:       See hpi  Skin: Negative for rash.  Allergic/Immunologic: Negative.   Neurological: Negative for weakness, light-headedness and numbness.  Psychiatric/Behavioral: Negative for dysphoric mood and suicidal ideas.    Objective:  BP (!) 144/87 (BP Location: Left Arm, Patient Position: Sitting, Cuff Size: Large)   Pulse 85   Temp 98.3 F (36.8 C) (Oral)   Resp 18   Ht 5\' 9"  (1.753 m)   Wt 236 lb (107 kg)   SpO2 97%   BMI 34.85 kg/m   BP/Weight  08/19/2016 07/09/2016 1/94/1740  Systolic BP 814 481 856  Diastolic BP 87 97 86  Wt. (Lbs) 236 - 231.2  BMI 34.85 - 34.14      Physical Exam  Constitutional: He is oriented to person, place, and time. He appears well-developed and well-nourished.  Cardiovascular: Normal rate, normal heart sounds and intact distal pulses.   No murmur heard. Pulmonary/Chest: Effort normal and breath sounds normal. He has no wheezes. He has no rales. He exhibits no tenderness.  Abdominal: Soft. Bowel sounds are normal. He exhibits no distension and no mass. There is no tenderness.  Musculoskeletal: Normal range of motion. He exhibits no edema or tenderness.  Neurological: He is alert and oriented to person, place, and time.     Lab Results  Component Value Date   HGBA1C 6.3 03/06/2016    Assessment & Plan:   1. Idiopathic chronic gout without tophus, unspecified site Still appears to be uncontrolled Increase dose of Allopurinol - Uric Acid - allopurinol (ZYLOPRIM) 300 MG tablet; Take 1 tablet (300 mg total) by mouth 2 (two) times daily.  Dispense: 60 tablet; Refill: 3 - colchicine 0.6 MG tablet; TAKE 2 TABS BY MOUTH AT THE ONSET OF A GOUT ATTACK, MAY REPEAT 1 TAB AFTER 1 HOUR IF SYMPTOMS PERSIST.  Dispense: 30 tablet; Refill: 3  2. Type 2 diabetes mellitus without complication, without long-term current use of insulin (HCC) Diet controlled with A1c of 6.3  3. Left foot pain Commenced on gabapentin symptoms are suspicious for neuropathy He could also be having frequent gout flares for which he is gout regimen has been adjusted - gabapentin (NEURONTIN) 300 MG capsule; Take 1 capsule (300 mg total) by mouth 2 (two) times daily.  Dispense: 60 capsule; Refill: 3  4. Pure hypercholesterolemia Low cholesterol diet - atorvastatin (LIPITOR) 20 MG tablet; Take 1 tablet (20 mg total) by mouth daily.  Dispense: 30 tablet; Refill: 3  5. Elevated blood pressure His goal is less than 130/80 Continue  lifestyle modification and I will commence antihypertensive regimen at next visit if still elevated  Meds ordered this encounter  Medications  . allopurinol (ZYLOPRIM) 300 MG tablet    Sig: Take 1 tablet (300 mg total) by mouth 2 (two) times daily.    Dispense:  60 tablet    Refill:  3    Discontinue previous dose  . atorvastatin (LIPITOR) 20 MG tablet    Sig: Take 1 tablet (20 mg total) by mouth daily.    Dispense:  30 tablet    Refill:  3  . colchicine 0.6 MG tablet    Sig: TAKE 2 TABS BY MOUTH AT THE ONSET OF A GOUT ATTACK, MAY REPEAT 1 TAB AFTER 1 HOUR IF SYMPTOMS PERSIST.    Dispense:  30 tablet    Refill:  3  . gabapentin (NEURONTIN) 300 MG capsule  Sig: Take 1 capsule (300 mg total) by mouth 2 (two) times daily.    Dispense:  60 capsule    Refill:  3    Follow-up: Return in about 3 months (around 11/19/2016) for follow up of chronic medical conditions.   This note has been created with Surveyor, quantity. Any transcriptional errors are unintentional.     Arnoldo Morale MD

## 2016-08-19 NOTE — Patient Instructions (Signed)

## 2016-08-20 LAB — URIC ACID: Uric Acid: 4.5 mg/dL (ref 3.7–8.6)

## 2016-08-20 MED FILL — ACETAMINOPHEN/COD #3 TABLET: 300-30 | 2 days supply | Qty: 10 | Fill #0

## 2016-08-26 ENCOUNTER — Telehealth: Payer: Self-pay | Admitting: Family Medicine

## 2016-08-26 MED ORDER — MELOXICAM 7.5 MG PO TABS: 7.5000 mg | ORAL_TABLET | Freq: Every day | ORAL | 1 refills | Status: DC

## 2016-08-26 NOTE — Telephone Encounter (Signed)
I have sent a prescription for meloxicam to the pharmacy for his shoulder pain. The Tylenol 3 he received previously was for an acute condition at the time.

## 2016-08-26 NOTE — Telephone Encounter (Signed)
Pt. Called requesting an Rx for Tylenol # 3. Pt. States he has shoulder pain. Please f/u with pt.

## 2016-09-10 ENCOUNTER — Ambulatory Visit: Payer: Self-pay | Attending: Family Medicine | Admitting: Family Medicine

## 2016-09-10 VITALS — BP 120/72 | HR 72 | Temp 98.2°F | Ht 69.0 in | Wt 226.2 lb

## 2016-09-10 DIAGNOSIS — M109 Gout, unspecified: Secondary | ICD-10-CM | POA: Insufficient documentation

## 2016-09-10 DIAGNOSIS — M5441 Lumbago with sciatica, right side: Secondary | ICD-10-CM | POA: Insufficient documentation

## 2016-09-10 DIAGNOSIS — E119 Type 2 diabetes mellitus without complications: Secondary | ICD-10-CM | POA: Insufficient documentation

## 2016-09-10 DIAGNOSIS — G8929 Other chronic pain: Secondary | ICD-10-CM | POA: Insufficient documentation

## 2016-09-11 ENCOUNTER — Encounter: Payer: Self-pay | Admitting: Family Medicine

## 2016-09-11 LAB — GLUCOSE, POCT (MANUAL RESULT ENTRY): POC Glucose: 97 mg/dl (ref 70–99)

## 2016-09-11 MED ORDER — METHYLPREDNISOLONE SODIUM SUCC 125 MG IJ SOLR
125.0000 mg | Freq: Once | INTRAMUSCULAR | Status: AC
  Administered 2016-09-10: 125 mg via INTRAMUSCULAR

## 2016-09-11 MED ORDER — CYCLOBENZAPRINE HCL 10 MG PO TABS: 10.0000 mg | ORAL_TABLET | Freq: Two times a day (BID) | ORAL | 1 refills | Status: DC | PRN

## 2016-09-11 MED FILL — CYCLOBENZAPRINE 10 MG TAB: 10 | 30 days supply | Qty: 60 | Fill #0

## 2016-09-11 NOTE — Progress Notes (Signed)
Subjective:  Patient ID: Terry Poole, male    DOB: 25-May-1965  Age: 51 y.o. MRN: [id number]  CC: Leg Pain   HPI Terry Poole is a 51 year old male with a history of type 2 diabetes mellitus (A1c 6.3, diet controlled) gout, second right hammertoe correction who presents today for an acute visit complaining of a one day history of right-sided low back pain which radiates down to his right leg. Pain is increased with walking and relieved by sitting. He describes this as having no strength in his right lower extremity.  He does have intermittent chronic low back pain but states this is different. He denies numbness in extremities, recent falls.  Past Medical History:  Diagnosis Date  . Bronchitis   . Bronchitis   . Gout   . Hammertoe of second toe of right foot   . Smoker     Past Surgical History:  Procedure Laterality Date  . HAMMER TOE SURGERY Right 03/10/2016   Procedure: 2ND RIGHT HAMMER TOE CORRECTION;  Surgeon: Landis Martins, DPM;  Location: [place];  Service: Podiatry;  Laterality: Right;  . MASS EXCISION Right 03/10/2016   Procedure: EXCISION OF CALLUS 2ND RIGHT TOE;  Surgeon: Landis Martins, DPM;  Location: [place];  Service: Podiatry;  Laterality: Right;  . NO PAST SURGERIES      Allergies  Allergen Reactions  . Tramadol Hives  . Vicodin [Hydrocodone-Acetaminophen] Hives     Outpatient Medications Prior to Visit  Medication Sig Dispense Refill  . allopurinol (ZYLOPRIM) 300 MG tablet Take 1 tablet (300 mg total) by mouth 2 (two) times daily. 60 tablet 3  . atorvastatin (LIPITOR) 20 MG tablet Take 1 tablet (20 mg total) by mouth daily. 30 tablet 3  . colchicine 0.6 MG tablet TAKE 2 TABS BY MOUTH AT THE ONSET OF A GOUT ATTACK, MAY REPEAT 1 TAB AFTER 1 HOUR IF SYMPTOMS PERSIST. 30 tablet 3  . gabapentin (NEURONTIN) 300 MG capsule Take 1 capsule (300 mg total) by mouth 2 (two) times daily. 60 capsule 3  . meloxicam (MOBIC) 7.5 MG  tablet Take 1 tablet (7.5 mg total) by mouth daily. 30 tablet 1  . orphenadrine (NORFLEX) 100 MG tablet Take 1 tablet (100 mg total) by mouth 2 (two) times daily as needed for muscle spasms. 20 tablet 0  . sildenafil (VIAGRA) 50 MG tablet Take 1 tablet (50 mg total) by mouth daily as needed for erectile dysfunction. At least 24 hours between doses. 30 tablet 0  . terbinafine (LAMISIL) 250 MG tablet Take 1 tablet (250 mg total) by mouth daily. 90 tablet 0  . acetaminophen-codeine (TYLENOL #3) 300-30 MG tablet Take 1-2 tablets by mouth every 8 (eight) hours as needed for moderate pain. (Patient not taking: Reported on 09/11/2016) 10 tablet 0   Facility-Administered Medications Prior to Visit  Medication Dose Route Frequency Provider Last Rate Last Dose  . triamcinolone acetonide (KENALOG) 10 MG/ML injection 10 mg  10 mg Other Once Landis Martins, DPM        ROS Review of Systems  Constitutional: Negative for activity change and appetite change.  HENT: Negative for sinus pressure and sore throat.   Respiratory: Negative for chest tightness, shortness of breath and wheezing.   Cardiovascular: Negative for chest pain and palpitations.  Gastrointestinal: Negative for abdominal distention, abdominal pain and constipation.  Genitourinary: Negative.   Musculoskeletal:       See hpi  Psychiatric/Behavioral: Negative for behavioral problems and dysphoric mood.  Objective:  BP 120/72   Pulse 72   Temp 98.2 F (36.8 C) (Oral)   Ht 5\' 9"  (1.753 m)   Wt 226 lb 3.2 oz (102.6 kg)   SpO2 95%   BMI 33.40 kg/m   BP/Weight 09/10/2016 3/66/2947 07/08/4648  Systolic BP 354 656 812  Diastolic BP 72 87 97  Wt. (Lbs) 226.2 236 -  BMI 33.4 34.85 -      Physical Exam  Constitutional: He is oriented to person, place, and time. He appears well-developed and well-nourished.  Cardiovascular: Normal rate, normal heart sounds and intact distal pulses.   No murmur heard. Pulmonary/Chest: Effort normal and  breath sounds normal. He has no wheezes. He has no rales. He exhibits no tenderness.  Abdominal: Soft. Bowel sounds are normal. He exhibits no distension and no mass. There is no tenderness.  Musculoskeletal: He exhibits tenderness (Slight tenderness on palpation of right side of back; positive straight leg raise).  Neurological: He is alert and oriented to person, place, and time.    Lab Results  Component Value Date   HGBA1C 6.3 03/06/2016    Assessment & Plan:   1. Type 2 diabetes mellitus without complication, without long-term current use of insulin (HCC) Diet controlled with last A1c of 6.3 - POCT glucose (manual entry)  2. Acute right-sided low back pain with right-sided sciatica Advised to apply heat - methylPREDNISolone sodium succinate (SOLU-MEDROL) 125 mg/2 mL injection 125 mg; Inject 2 mLs (125 mg total) into the muscle once. - cyclobenzaprine (FLEXERIL) 10 MG tablet; Take 1 tablet (10 mg total) by mouth 2 (two) times daily as needed for muscle spasms.  Dispense: 60 tablet; Refill: 1   Meds ordered this encounter  Medications  . methylPREDNISolone sodium succinate (SOLU-MEDROL) 125 mg/2 mL injection 125 mg  . cyclobenzaprine (FLEXERIL) 10 MG tablet    Sig: Take 1 tablet (10 mg total) by mouth 2 (two) times daily as needed for muscle spasms.    Dispense:  60 tablet    Refill:  1    Follow-up: Return for Follow-up of chronic medical conditions, please keep previously scheduled appointment.   This note has been created with Surveyor, quantity. Any transcriptional errors are unintentional.     Arnoldo Morale MD

## 2016-09-15 ENCOUNTER — Ambulatory Visit: Payer: No Typology Code available for payment source

## 2016-09-15 ENCOUNTER — Other Ambulatory Visit: Payer: Self-pay | Admitting: Family Medicine

## 2016-09-15 ENCOUNTER — Ambulatory Visit: Payer: No Typology Code available for payment source | Admitting: Podiatry

## 2016-09-15 ENCOUNTER — Ambulatory Visit: Payer: Self-pay | Attending: Family Medicine

## 2016-09-15 DIAGNOSIS — E119 Type 2 diabetes mellitus without complications: Secondary | ICD-10-CM

## 2016-09-15 DIAGNOSIS — M778 Other enthesopathies, not elsewhere classified: Secondary | ICD-10-CM

## 2016-09-15 DIAGNOSIS — B351 Tinea unguium: Secondary | ICD-10-CM

## 2016-09-15 DIAGNOSIS — M779 Enthesopathy, unspecified: Principal | ICD-10-CM

## 2016-09-15 DIAGNOSIS — M7752 Other enthesopathy of left foot: Secondary | ICD-10-CM

## 2016-09-15 DIAGNOSIS — R52 Pain, unspecified: Secondary | ICD-10-CM

## 2016-09-15 MED ORDER — MELOXICAM 7.5 MG PO TABS: 7.5000 mg | ORAL_TABLET | Freq: Every day | ORAL | 1 refills | Status: DC

## 2016-09-15 MED ORDER — KETOCONAZOLE 2 % EX CREA: TOPICAL_CREAM | CUTANEOUS | 0 refills | Status: DC

## 2016-09-15 MED ORDER — KETOCONAZOLE 2 % EX CREA: TOPICAL_CREAM | Freq: Every day | CUTANEOUS | Status: DC

## 2016-09-15 MED FILL — MELOXICAM 7.5 MG TABLET: 7.5 | 30 days supply | Qty: 30 | Fill #0

## 2016-09-15 MED FILL — KETOCONAZOLE 2% CREAM: 2 | 15 days supply | Qty: 15 | Fill #0

## 2016-09-15 NOTE — Progress Notes (Signed)
Subjective:  Patient ID: Terry Poole, male    DOB: 1965-03-30,  MRN: [id number] HPI Chief Complaint  Patient presents with  . Foot Pain   51 y.o. male presents with the above complaint. Reports pain on the top of L foot x 1 months duration. Denies trauma. Denies change in activity or shoe gear. No change or increase in activity. No numbness or tingling. Pain is intermittent.   Also complains of loosening and discolored/thick great toenails. Has tried Lamisil in the past and requests new Rx today. Looks like last appointment Dr. Cannon Poole ordered blood work but he did not get it done.   He states that the surgical site is doing well and having no pain.  He has no other concerns today.    Past Medical History:  Diagnosis Date  . Bronchitis   . Bronchitis   . Gout   . Hammertoe of second toe of right foot   . Smoker    Past Surgical History:  Procedure Laterality Date  . HAMMER TOE SURGERY Right 03/10/2016   Procedure: 2ND RIGHT HAMMER TOE CORRECTION;  Surgeon: Landis Martins, DPM;  Location: [facility];  Service: Podiatry;  Laterality: Right;  . MASS EXCISION Right 03/10/2016   Procedure: EXCISION OF CALLUS 2ND RIGHT TOE;  Surgeon: Landis Martins, DPM;  Location: [place];  Service: Podiatry;  Laterality: Right;  . NO PAST SURGERIES      Current Outpatient Prescriptions:  .  acetaminophen-codeine (TYLENOL #3) 300-30 MG tablet, Take 1-2 tablets by mouth every 8 (eight) hours as needed for moderate pain. (Patient not taking: Reported on 09/11/2016), Disp: 10 tablet, Rfl: 0 .  allopurinol (ZYLOPRIM) 300 MG tablet, Take 1 tablet (300 mg total) by mouth 2 (two) times daily., Disp: 60 tablet, Rfl: 3 .  atorvastatin (LIPITOR) 20 MG tablet, Take 1 tablet (20 mg total) by mouth daily., Disp: 30 tablet, Rfl: 3 .  colchicine 0.6 MG tablet, TAKE 2 TABS BY MOUTH AT THE ONSET OF A GOUT ATTACK, MAY REPEAT 1 TAB AFTER 1 HOUR IF SYMPTOMS PERSIST., Disp: 30 tablet, Rfl: 3 .   cyclobenzaprine (FLEXERIL) 10 MG tablet, Take 1 tablet (10 mg total) by mouth 2 (two) times daily as needed for muscle spasms., Disp: 60 tablet, Rfl: 1 .  gabapentin (NEURONTIN) 300 MG capsule, Take 1 capsule (300 mg total) by mouth 2 (two) times daily., Disp: 60 capsule, Rfl: 3 .  meloxicam (MOBIC) 7.5 MG tablet, Take 1 tablet (7.5 mg total) by mouth daily., Disp: 30 tablet, Rfl: 1 .  orphenadrine (NORFLEX) 100 MG tablet, Take 1 tablet (100 mg total) by mouth 2 (two) times daily as needed for muscle spasms., Disp: 20 tablet, Rfl: 0 .  sildenafil (VIAGRA) 50 MG tablet, Take 1 tablet (50 mg total) by mouth daily as needed for erectile dysfunction. At least 24 hours between doses., Disp: 30 tablet, Rfl: 0 .  terbinafine (LAMISIL) 250 MG tablet, Take 1 tablet (250 mg total) by mouth daily., Disp: 90 tablet, Rfl: 0  Current Facility-Administered Medications:  .  ketoconazole (NIZORAL) 2 % cream, , Topical, Daily, [place] A, DPM .  triamcinolone acetonide (KENALOG) 10 MG/ML injection 10 mg, 10 mg, Other, Once, Landis Martins, DPM  Allergies  Allergen Reactions  . Tramadol Hives  . Vicodin [Hydrocodone-Acetaminophen] Hives   Review of Systems Objective:  There were no vitals filed for this visit. General AA&O x3. Normal mood and affect.  Vascular Dorsalis pedis and posterior tibial pulses 2/4  bilat. Brisk capillary refill to all digits. Pedal hair present.  Neurologic Epicritic sensation grossly intact.  Dermatologic No open lesions. Interspaces clear of maceration. Hallux nails thickened and discolored bilat  Orthopedic: MMT 5/5 in dorsiflexion, plantarflexion, inversion, and eversion. Decreased ROM L 1st MPJ Pain on palpation L 2nd MPJ dorsally. Negative Mulder's click. There is no area of pinpoint bony tenderness and there is no pain to vibratory sensation   Radiographs: Taken and reviewed. No acute fractures or dislocations. 1st MPJ degenerative changes. Assessment & Plan:    Patient was evaluated and treated and all questions answered.  Capsulitis, L 2nd MPJ -Injection consisting of 1 cc Lidocaine 1% plain, 0.5 cc Dexamethasone, 0.5 cc Kenalog injected into the 2nd MPJ. Patient tolerated well. -Pads dispensed.  Onychomycosis -Rx Ketoconazole topical. -LFTs ordered prior to Rxing Lamisil. Order for blood work given to patient.   Return in about 2 weeks (around 09/29/2016) for nail fungus f/u.

## 2016-09-16 LAB — CMP14+EGFR
ALBUMIN: 4.1 g/dL (ref 3.5–5.5)
ALK PHOS: 131 IU/L — AB (ref 39–117)
ALT: 44 IU/L (ref 0–44)
AST: 27 IU/L (ref 0–40)
Albumin/Globulin Ratio: 1.5 (ref 1.2–2.2)
BILIRUBIN TOTAL: 0.3 mg/dL (ref 0.0–1.2)
BUN / CREAT RATIO: 10 (ref 9–20)
BUN: 11 mg/dL (ref 6–24)
CO2: 23 mmol/L (ref 20–29)
CREATININE: 1.08 mg/dL (ref 0.76–1.27)
Calcium: 9.1 mg/dL (ref 8.7–10.2)
Chloride: 104 mmol/L (ref 96–106)
GFR calc non Af Amer: 79 mL/min/{1.73_m2} (ref >59)
GFR, EST AFRICAN AMERICAN: 91 mL/min/{1.73_m2} (ref >59)
GLOBULIN, TOTAL: 2.8 g/dL (ref 1.5–4.5)
Glucose: 87 mg/dL (ref 65–99)
Potassium: 4.4 mmol/L (ref 3.5–5.2)
SODIUM: 141 mmol/L (ref 134–144)
TOTAL PROTEIN: 6.9 g/dL (ref 6.0–8.5)

## 2016-09-17 ENCOUNTER — Telehealth: Payer: Self-pay

## 2016-09-17 NOTE — Telephone Encounter (Signed)
Pt was called and informed of lab results. 

## 2016-09-29 ENCOUNTER — Ambulatory Visit: Payer: Self-pay | Attending: Family Medicine | Admitting: Family Medicine

## 2016-09-29 ENCOUNTER — Encounter (HOSPITAL_COMMUNITY): Payer: Self-pay | Admitting: Emergency Medicine

## 2016-09-29 ENCOUNTER — Encounter: Payer: Self-pay | Admitting: Family Medicine

## 2016-09-29 ENCOUNTER — Emergency Department (HOSPITAL_COMMUNITY): Payer: Self-pay

## 2016-09-29 ENCOUNTER — Emergency Department (HOSPITAL_COMMUNITY)
Admission: EM | Admit: 2016-09-29 | Discharge: 2016-09-29 | Disposition: A | Discharge status: Home / Self Care | Payer: Self-pay | Attending: Emergency Medicine | Admitting: Emergency Medicine

## 2016-09-29 VITALS — BP 141/80 | HR 78 | Temp 97.8°F | Ht 69.0 in | Wt 227.0 lb

## 2016-09-29 DIAGNOSIS — E119 Type 2 diabetes mellitus without complications: Secondary | ICD-10-CM | POA: Insufficient documentation

## 2016-09-29 DIAGNOSIS — M5432 Sciatica, left side: Secondary | ICD-10-CM | POA: Insufficient documentation

## 2016-09-29 DIAGNOSIS — M1A00X0 Idiopathic chronic gout, unspecified site, without tophus (tophi): Secondary | ICD-10-CM | POA: Insufficient documentation

## 2016-09-29 DIAGNOSIS — M1711 Unilateral primary osteoarthritis, right knee: Secondary | ICD-10-CM | POA: Insufficient documentation

## 2016-09-29 DIAGNOSIS — F17210 Nicotine dependence, cigarettes, uncomplicated: Secondary | ICD-10-CM | POA: Insufficient documentation

## 2016-09-29 DIAGNOSIS — Z79899 Other long term (current) drug therapy: Secondary | ICD-10-CM | POA: Insufficient documentation

## 2016-09-29 DIAGNOSIS — M25561 Pain in right knee: Secondary | ICD-10-CM | POA: Insufficient documentation

## 2016-09-29 DIAGNOSIS — E7800 Pure hypercholesterolemia, unspecified: Secondary | ICD-10-CM | POA: Insufficient documentation

## 2016-09-29 DIAGNOSIS — Z9889 Other specified postprocedural states: Secondary | ICD-10-CM | POA: Insufficient documentation

## 2016-09-29 DIAGNOSIS — Q6689 Other  specified congenital deformities of feet: Secondary | ICD-10-CM | POA: Insufficient documentation

## 2016-09-29 DIAGNOSIS — Z885 Allergy status to narcotic agent status: Secondary | ICD-10-CM | POA: Insufficient documentation

## 2016-09-29 LAB — GLUCOSE, POCT (MANUAL RESULT ENTRY): POC Glucose: 104 mg/dl — AB (ref 70–99)

## 2016-09-29 MED ORDER — MELOXICAM 7.5 MG PO TABS: 7.5000 mg | ORAL_TABLET | Freq: Every day | ORAL | 1 refills | Status: DC

## 2016-09-29 MED ORDER — TRAMADOL HCL 50 MG PO TABS
50.0000 mg | ORAL_TABLET | Freq: Two times a day (BID) | ORAL | 0 refills | Status: DC | PRN
Start: 2016-09-29 — End: 2017-02-16

## 2016-09-29 MED ORDER — COLCHICINE 0.6 MG PO TABS: ORAL_TABLET | ORAL | 3 refills | Status: DC

## 2016-09-29 MED ORDER — ALLOPURINOL 300 MG PO TABS: 300.0000 mg | ORAL_TABLET | Freq: Two times a day (BID) | ORAL | 3 refills | Status: DC

## 2016-09-29 MED ORDER — GABAPENTIN 300 MG PO CAPS: 300.0000 mg | ORAL_CAPSULE | Freq: Two times a day (BID) | ORAL | 3 refills | Status: DC

## 2016-09-29 MED ORDER — ATORVASTATIN CALCIUM 20 MG PO TABS: 20.0000 mg | ORAL_TABLET | Freq: Every day | ORAL | 3 refills | Status: DC

## 2016-09-29 MED FILL — ?ATORVASTATIN 20 MG TABLET: 20 | 30 days supply | Qty: 30 | Fill #0

## 2016-09-29 MED FILL — traMADol HCL 50 MG TABS: 50 | 20 days supply | Qty: 40 | Fill #0

## 2016-09-29 MED FILL — $COLCRYS 0.6 MG TABLET: 0.6 | 30 days supply | Qty: 30 | Fill #0

## 2016-09-29 MED FILL — GABAPENTIN 300 MG CAPSULE: 300 | 30 days supply | Qty: 60 | Fill #0

## 2016-09-29 MED FILL — ALLOPURINOL 300 MG TABS: 300 | 30 days supply | Qty: 60 | Fill #0

## 2016-09-29 NOTE — Progress Notes (Signed)
Subjective:  Patient ID: Terry Poole, male    DOB: 1965-02-23  Age: 51 y.o. MRN: [id number]  CC: Leg Pain and Diabetes   HPI Terry Poole  is a 51 year old male with a history of type 2 diabetes mellitus (A1c 6.5, diet controlled)Hyperlipidemia, gout, second right hammertoe correction who presents today with complaints of right knee pain.  Patient has been on for the last few weeks and describes this as radiating from his thigh to his knee and down to his anterior leg with an occasion where his knee 'gave out' on him and he almost fell. He denies history of trauma; he thinks his right knee is a bit swollen.   At his last visit he was seen for right-sided sciatica which he does not have at this time but then later informs me the pain has 'shifted' to the left but he declines to state that pain has improved even though he appears much better than at his last visit.  His diabetes is diet-controlled. Denies recent Gout flares and requests refills of his chronic medications.  Past Medical History:  Diagnosis Date  . Bronchitis   . Bronchitis   . Gout   . Hammertoe of second toe of right foot   . Smoker     Past Surgical History:  Procedure Laterality Date  . HAMMER TOE SURGERY Right 03/10/2016   Procedure: 2ND RIGHT HAMMER TOE CORRECTION;  Surgeon: Landis Martins, DPM;  Location: [place];  Service: Podiatry;  Laterality: Right;  . MASS EXCISION Right 03/10/2016   Procedure: EXCISION OF CALLUS 2ND RIGHT TOE;  Surgeon: Landis Martins, DPM;  Location: [place];  Service: Podiatry;  Laterality: Right;  . NO PAST SURGERIES      Allergies  Allergen Reactions  . Tramadol Hives  . Vicodin [Hydrocodone-Acetaminophen] Hives     Outpatient Medications Prior to Visit  Medication Sig Dispense Refill  . cyclobenzaprine (FLEXERIL) 10 MG tablet Take 1 tablet (10 mg total) by mouth 2 (two) times daily as needed for muscle spasms. (Patient not taking: Reported  on 09/29/2016) 60 tablet 1  . orphenadrine (NORFLEX) 100 MG tablet Take 1 tablet (100 mg total) by mouth 2 (two) times daily as needed for muscle spasms. (Patient not taking: Reported on 09/29/2016) 20 tablet 0  . sildenafil (VIAGRA) 50 MG tablet Take 1 tablet (50 mg total) by mouth daily as needed for erectile dysfunction. At least 24 hours between doses. (Patient not taking: Reported on 09/29/2016) 30 tablet 0  . terbinafine (LAMISIL) 250 MG tablet Take 1 tablet (250 mg total) by mouth daily. (Patient not taking: Reported on 09/29/2016) 90 tablet 0  . acetaminophen-codeine (TYLENOL #3) 300-30 MG tablet Take 1-2 tablets by mouth every 8 (eight) hours as needed for moderate pain. (Patient not taking: Reported on 09/11/2016) 10 tablet 0  . allopurinol (ZYLOPRIM) 300 MG tablet Take 1 tablet (300 mg total) by mouth 2 (two) times daily. (Patient not taking: Reported on 09/29/2016) 60 tablet 3  . atorvastatin (LIPITOR) 20 MG tablet Take 1 tablet (20 mg total) by mouth daily. (Patient not taking: Reported on 09/29/2016) 30 tablet 3  . colchicine 0.6 MG tablet TAKE 2 TABS BY MOUTH AT THE ONSET OF A GOUT ATTACK, MAY REPEAT 1 TAB AFTER 1 HOUR IF SYMPTOMS PERSIST. (Patient not taking: Reported on 09/29/2016) 30 tablet 3  . gabapentin (NEURONTIN) 300 MG capsule Take 1 capsule (300 mg total) by mouth 2 (two) times daily. (Patient not taking: Reported  on 09/29/2016) 60 capsule 3  . ketoconazole (NIZORAL) 2 % cream Apply a fingertip amount to great toenails daily. (Patient not taking: Reported on 09/29/2016) 15 g 0  . meloxicam (MOBIC) 7.5 MG tablet Take 1 tablet (7.5 mg total) by mouth daily. (Patient not taking: Reported on 09/29/2016) 30 tablet 1   Facility-Administered Medications Prior to Visit  Medication Dose Route Frequency Provider Last Rate Last Dose  . triamcinolone acetonide (KENALOG) 10 MG/ML injection 10 mg  10 mg Other Once Landis Martins, DPM        ROS Review of Systems  Constitutional: Negative for  activity change and appetite change.  HENT: Negative for sinus pressure and sore throat.   Eyes: Negative for visual disturbance.  Respiratory: Negative for cough, chest tightness and shortness of breath.   Cardiovascular: Negative for chest pain and leg swelling.  Gastrointestinal: Negative for abdominal distention, abdominal pain, constipation and diarrhea.  Endocrine: Negative.   Genitourinary: Negative for dysuria.  Musculoskeletal:       See hpi  Skin: Negative for rash.  Allergic/Immunologic: Negative.   Neurological: Negative for weakness, light-headedness and numbness.  Psychiatric/Behavioral: Negative for dysphoric mood and suicidal ideas.    Objective:  BP (!) 141/80   Pulse 78   Temp 97.8 F (36.6 C) (Oral)   Ht 5\' 9"  (1.753 m)   Wt 227 lb (103 kg)   SpO2 97%   BMI 33.52 kg/m   BP/Weight 09/29/2016 09/10/2016 05/12/8117  Systolic BP 147 829 562  Diastolic BP 80 72 87  Wt. (Lbs) 227 226.2 236  BMI 33.52 33.4 34.85      Physical Exam  Constitutional: He is oriented to person, place, and time. He appears well-developed and well-nourished.  Cardiovascular: Normal rate, normal heart sounds and intact distal pulses.   No murmur heard. Pulmonary/Chest: Effort normal and breath sounds normal. He has no wheezes. He has no rales. He exhibits no tenderness.  Abdominal: Soft. Bowel sounds are normal. He exhibits no distension and no mass. There is no tenderness.  Musculoskeletal: He exhibits edema (Slight right knee edema) and tenderness (Tenderness and crepitus on range of right knee; left knee with crepitus but no tenderness).  Neurological: He is alert and oriented to person, place, and time.  Skin: Skin is warm and dry.  Psychiatric: He has a normal mood and affect.    Lipid Panel     Component Value Date/Time   CHOL 218 (H) 05/13/2016 1627   TRIG 193 (H) 05/13/2016 1627   HDL 57 05/13/2016 1627   CHOLHDL 3.8 05/13/2016 1627   CHOLHDL 2.6 09/21/2015 1053   VLDL  47 (H) 09/21/2015 1053   LDLCALC 122 (H) 05/13/2016 1627     Assessment & Plan:   1. Type 2 diabetes mellitus without complication, without long-term current use of insulin (HCC) Diet controlled with A1c of 6.5 - POCT glucose (manual entry)  2. Idiopathic chronic gout without tophus, unspecified site No acute flare - allopurinol (ZYLOPRIM) 300 MG tablet; Take 1 tablet (300 mg total) by mouth 2 (two) times daily.  Dispense: 60 tablet; Refill: 3 - colchicine 0.6 MG tablet; TAKE 2 TABS BY MOUTH AT THE ONSET OF A GOUT ATTACK, MAY REPEAT 1 TAB AFTER 1 HOUR IF SYMPTOMS PERSIST.  Dispense: 30 tablet; Refill: 3  3. Sciatica of left side Stable - gabapentin (NEURONTIN) 300 MG capsule; Take 1 capsule (300 mg total) by mouth 2 (two) times daily.  Dispense: 60 capsule; Refill: 3  4. Pure  hypercholesterolemia Stable - atorvastatin (LIPITOR) 20 MG tablet; Take 1 tablet (20 mg total) by mouth daily.  Dispense: 30 tablet; Refill: 3  5. Acute pain of right knee Likely underlying osteoarthritis Refilled NSAIDs Placed on tramadol We have discussed other management options including knee brace, corticosteroid injections and surgery down the road if symptoms are unrelenting - meloxicam (MOBIC) 7.5 MG tablet; Take 1 tablet (7.5 mg total) by mouth daily.  Dispense: 30 tablet; Refill: 1 - traMADol (ULTRAM) 50 MG tablet; Take 1 tablet (50 mg total) by mouth every 12 (twelve) hours as needed.  Dispense: 40 tablet; Refill: 0 - DG Knee Complete 4 Views Right; Future   Meds ordered this encounter  Medications  . meloxicam (MOBIC) 7.5 MG tablet    Sig: Take 1 tablet (7.5 mg total) by mouth daily.    Dispense:  30 tablet    Refill:  1  . allopurinol (ZYLOPRIM) 300 MG tablet    Sig: Take 1 tablet (300 mg total) by mouth 2 (two) times daily.    Dispense:  60 tablet    Refill:  3    Discontinue previous dose  . gabapentin (NEURONTIN) 300 MG capsule    Sig: Take 1 capsule (300 mg total) by mouth 2 (two)  times daily.    Dispense:  60 capsule    Refill:  3  . colchicine 0.6 MG tablet    Sig: TAKE 2 TABS BY MOUTH AT THE ONSET OF A GOUT ATTACK, MAY REPEAT 1 TAB AFTER 1 HOUR IF SYMPTOMS PERSIST.    Dispense:  30 tablet    Refill:  3  . atorvastatin (LIPITOR) 20 MG tablet    Sig: Take 1 tablet (20 mg total) by mouth daily.    Dispense:  30 tablet    Refill:  3  . traMADol (ULTRAM) 50 MG tablet    Sig: Take 1 tablet (50 mg total) by mouth every 12 (twelve) hours as needed.    Dispense:  40 tablet    Refill:  0    Follow-up: Return in about 2 months (around 11/29/2016) for Follow-up of knee pain.   Arnoldo Morale MD

## 2016-09-29 NOTE — ED Provider Notes (Signed)
[facility] DEPT Provider Note   CSN: [id number] Arrival date & time: 09/29/16  1621     History   Chief Complaint Chief Complaint  Patient presents with  . Knee Pain    HPI Terry Poole is a 51 y.o. male sent over from [place] to the emergency department today for several months of right knee pain. [facility] for x-rays for likely diagnosis of underlying osteoarthritis. Patient was given prescription of Mobic and tramadol for pain. Discussion took place for the options of a knee brace, corticosteroid injections and surgery down the road pending x-rays and trial of NSAID therapy.  Patient states that over the last several months she's had increasing pain in his right knee greatest on the medial portion. He states that this is a dull, achy pain which she rates as a 6/10. Pain is greatest in the morning and after periods of rest. He notes that whether like rain causes pain to worsen. 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 He has not taken anything for the pain. He notes that occasionally he will have swelling of the right knee. He denies restriction of his range of motion. Denies fever, chills, numbness or tingling of the lower extremity.  HPI  Past Medical History:  Diagnosis Date  . Bronchitis   . Bronchitis   . Gout   . Hammertoe of second toe of right foot   . Smoker     Patient Active Problem List   Diagnosis Date Noted  . Type 2 diabetes mellitus ([place]) 08/19/2016  . Hyperlipidemia 08/19/2016  . Back pain 05/13/2016  . Hammertoe of second toe of right foot 03/06/2016  . Onychomycosis 04/16/2015  . 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 Gout attack 03/28/2015    Past Surgical History:  Procedure Laterality Date  . HAMMER TOE SURGERY Right 03/10/2016   Procedure: 2ND RIGHT HAMMER TOE CORRECTION;  Surgeon: Landis Martins,  DPM;  Location: [place];  Service: Podiatry;  Laterality: Right;  . MASS EXCISION Right 03/10/2016   Procedure: EXCISION OF CALLUS 2ND RIGHT TOE;  Surgeon: Landis Martins, DPM;  Location: [place] [facility];  Service: Podiatry;  Laterality: Right;  . NO PAST SURGERIES         Home Medications    Prior to Admission medications   Medication Sig Start Date End Date Taking? Authorizing Provider  allopurinol (ZYLOPRIM) 300 MG tablet Take 1 tablet (300 mg total) by mouth 2 (two) times daily. 09/29/16  Yes Arnoldo Morale, MD  atorvastatin (LIPITOR) 20 MG tablet Take 1 tablet (20 mg total) by mouth daily. 09/29/16  Yes Amao, Charlane Ferretti, MD  colchicine 0.6 MG tablet TAKE 2 TABS BY MOUTH AT THE ONSET OF A GOUT ATTACK, MAY REPEAT 1 TAB AFTER 1 HOUR IF SYMPTOMS PERSIST. 09/29/16  Yes Amao, Charlane Ferretti, MD  cyclobenzaprine (FLEXERIL) 10 MG tablet Take 1 tablet (10 mg total) by mouth 2 (two) times daily as needed for muscle spasms. 09/11/16  Yes Arnoldo Morale, MD  gabapentin (NEURONTIN) 300 MG capsule Take 1 capsule (300 mg total) by mouth 2 (two) times daily. 09/29/16  Yes Arnoldo Morale, MD  meloxicam (MOBIC) 7.5 MG tablet Take 1 tablet (7.5 mg total) by mouth daily. 09/29/16  Yes Arnoldo Morale, MD  orphenadrine (NORFLEX) 100 MG tablet Take 1 tablet (100 mg total) by mouth 2 (two) times  daily as needed for muscle spasms. 07/09/16  Yes Amyot, Nicholes Stairs, NP  sildenafil (VIAGRA) 50 MG tablet Take 1 tablet (50 mg total) by mouth daily as needed for erectile dysfunction. At least 24 hours between doses. 05/30/16  Yes Arnoldo Morale, MD  terbinafine (LAMISIL) 250 MG tablet Take 1 tablet (250 mg total) by mouth daily. 05/13/16  Yes Stover, Titorya, DPM  traMADol (ULTRAM) 50 MG tablet Take 1 tablet (50 mg total) by mouth every 12 (twelve) hours as needed. 09/29/16   Arnoldo Morale, MD    Family History History reviewed. No pertinent family history.  Social History Social History  Substance Use Topics  .  Smoking status: Current Every Day Smoker    Packs/day: 0.25    Types: Cigarettes  . Smokeless tobacco: Never Used     Comment: 8 cigs daily  . Alcohol use No     Allergies   Tramadol and Vicodin [hydrocodone-acetaminophen]   Review of Systems Review of Systems  Constitutional: Negative for fever.  Musculoskeletal: Positive for arthralgias.  Skin: Negative for color change.  Neurological: Negative for weakness and numbness.     Physical Exam Updated Vital Signs BP (!) 158/98 (BP Location: Right Arm)   Pulse 79   Temp (!) 97.4 F (36.3 C) (Oral)   Resp 20   SpO2 99%   Physical Exam  Constitutional: He appears well-developed and well-nourished.  HENT:  Head: Normocephalic and atraumatic.  Right Ear: External ear normal.  Left Ear: External ear normal.  Eyes: Conjunctivae are normal. Right eye exhibits no discharge. Left eye exhibits no discharge. No scleral icterus.  Pulmonary/Chest: Effort normal. No respiratory distress.  Musculoskeletal:       Right ankle: Normal.  Right knee: Parents normal. Mild effusion. Tenderness palpation over the medial joint line. Extension to 180 actively and 90 of flexion actively. Strength 5/5 for flexion and extension. Negative anterior and posterior drawer test. Negative Lachman's test. No MCL or LCL laxity. McMurray's test difficult to interpret. Patient with painful but able gait. Favors right leg. Noted fallen arches of bilateral feet. Neurovascularly intact distally. Compartments soft above and below affected joint.   Neurological: He is alert.  Skin: No pallor.  Psychiatric: He has a normal mood and affect.  Nursing note and vitals reviewed.    ED Treatments / Results  Labs (all labs ordered are listed, but only abnormal results are displayed) Labs Reviewed - No data to display  EKG  EKG Interpretation None       Radiology Dg Knee Complete 4 Views Right  Result Date: 09/29/2016 CLINICAL DATA:  Generalized RIGHT knee  pain especially medially, pain with walking and standing, going on for while but recently becoming more severe EXAM: RIGHT KNEE - COMPLETE 4+ VIEW COMPARISON:  None FINDINGS: Osseous mineralization normal. Tricompartmental osteoarthritic changes with joint space narrowing and spur formation, greatest at medial compartment. Associated moderate knee joint effusion. No acute fracture, dislocation, or bone destruction. IMPRESSION: Tricompartmental osteoarthritic changes RIGHT knee greatest at medial compartment with associated moderate knee joint effusion. Electronically Signed   By: Lavonia Dana M.D.   On: 09/29/2016 20:27    Procedures Procedures (including critical care time)  Medications Ordered in ED Medications - No data to display   Initial Impression / Assessment and Plan / ED Course  I have reviewed the triage vital signs and the nursing notes.  Pertinent labs & imaging results that were available during my care of the patient were reviewed by me and  considered in my medical decision making (see chart for details).     51 year old male sent over from [facility] for x-ray of right knee with concern of arthritis. Patient presents with aching pain and joint stiffness worsened in the morning. There is tenderness to palpation medial joint line on exam with minimally limited range of motion. No current medical concern for septic arthritis as patient is afebrile and without warmth of the affected area. Patient also has intact range of motion. Patient's x-ray negative for obvious fracture or dislocation. There is tricompartmental arthritis worse on the medial joint line. Presentation consistent with diagnosis of osteoarthritis. Knee brace given in the department. Pt advised to follow up with orthopedics if symptoms persist for further evaluation if conservative therapies do not work. Recommended PT, exercise, and tylenol. Patient given Mobic and tramadol prescription from [facility]. Advised  him he can take these medications at home. Return precaurtions discussed. Patient will be dc home & is agreeable with above plan.   Final Clinical Impressions(s) / ED Diagnoses   Final diagnoses:  Arthritis of right knee    New Prescriptions New Prescriptions   No medications on file     Lorelle Gibbs 09/30/16 0112    Pattricia Boss, MD 10/02/16 1556

## 2016-09-29 NOTE — Progress Notes (Signed)
Orthopedic Tech Progress Note Patient Details:  Terry Poole [birth year] [id number]  Ortho Devices Type of Ortho Device: Knee Sleeve Ortho Device/Splint Location: RLE Ortho Device/Splint Interventions: Ordered, Application   Braulio Bosch 09/29/2016, 9:16 PM

## 2016-09-29 NOTE — ED Triage Notes (Signed)
Pt to ER for right knee pain and swelling that has been going on for months. States has progressively gotten worse. States anterior knee pain that radiates down the shin. States [facility] sent patient here for xray.

## 2016-09-29 NOTE — Discharge Instructions (Signed)
Your xrays show that you have arthritis of the right knee. I have given your knee brace to wear for this. I have attached educational handouts for this. Please take the medications given to you at the wellness [facility] for this. Please follow with orthopedics in regards to your symptoms discuss further treatment. Please follow up with [facility] for your scheduled appointment. If you develop worsening or new concerning symptoms you can return to the emergency department for re-evaluation. Other reasons to return are fever, or/and a red, hot, swollen joint that you are unable to bend.

## 2016-09-30 LAB — POCT GLYCOSYLATED HEMOGLOBIN (HGB A1C): Hemoglobin A1C: 6.5

## 2016-09-30 NOTE — Addendum Note (Signed)
Addended by: Gomez Cleverly on: 09/30/2016 09:12 AM   Modules accepted: Orders

## 2016-10-04 ENCOUNTER — Emergency Department (HOSPITAL_COMMUNITY)
Admission: EM | Admit: 2016-10-04 | Discharge: 2016-10-04 | Disposition: A | Discharge status: Home / Self Care | Payer: Self-pay | Attending: Emergency Medicine | Admitting: Emergency Medicine

## 2016-10-04 ENCOUNTER — Encounter (HOSPITAL_COMMUNITY): Payer: Self-pay | Admitting: Emergency Medicine

## 2016-10-04 ENCOUNTER — Emergency Department (HOSPITAL_COMMUNITY): Payer: Self-pay

## 2016-10-04 DIAGNOSIS — S93402A Sprain of unspecified ligament of left ankle, initial encounter: Secondary | ICD-10-CM | POA: Insufficient documentation

## 2016-10-04 DIAGNOSIS — Y999 Unspecified external cause status: Secondary | ICD-10-CM | POA: Insufficient documentation

## 2016-10-04 DIAGNOSIS — E119 Type 2 diabetes mellitus without complications: Secondary | ICD-10-CM | POA: Insufficient documentation

## 2016-10-04 DIAGNOSIS — F17210 Nicotine dependence, cigarettes, uncomplicated: Secondary | ICD-10-CM | POA: Insufficient documentation

## 2016-10-04 DIAGNOSIS — Y939 Activity, unspecified: Secondary | ICD-10-CM | POA: Insufficient documentation

## 2016-10-04 DIAGNOSIS — Y929 Unspecified place or not applicable: Secondary | ICD-10-CM | POA: Insufficient documentation

## 2016-10-04 DIAGNOSIS — X509XXA Other and unspecified overexertion or strenuous movements or postures, initial encounter: Secondary | ICD-10-CM | POA: Insufficient documentation

## 2016-10-04 DIAGNOSIS — Z79899 Other long term (current) drug therapy: Secondary | ICD-10-CM | POA: Insufficient documentation

## 2016-10-04 MED ORDER — IBUPROFEN 600 MG PO TABS: 600.0000 mg | ORAL_TABLET | Freq: Four times a day (QID) | ORAL | 0 refills | Status: DC | PRN

## 2016-10-04 NOTE — ED Provider Notes (Signed)
[facility] DEPT Provider Note   CSN: [id number] Arrival date & time: 10/04/16  1136     History   Chief Complaint Chief Complaint  Patient presents with  . Foot Pain    HPI Terry Poole is a 51 y.o. male.  HPI  51 y.o. male with a hx of Gout, presents to the Emergency Department today due to left foot pain x 3 days. No trauma or injury to area. Known hx gout. Denies redness or swelling. Does endorse being on his feet all three days for work. States pain is minimal at rest. Notes worse with ROM and ambulation. Pain isolated on lateral aspect below lateral malleolus. No numbness/tingling. No fevers. Denies pain currently. No other symptoms noted.    Past Medical History:  Diagnosis Date  . Bronchitis   . Bronchitis   . Gout   . Hammertoe of second toe of right foot   . Smoker     Patient Active Problem List   Diagnosis Date Noted  . Type 2 diabetes mellitus ([place]) 08/19/2016  . Hyperlipidemia 08/19/2016  . Back pain 05/13/2016  . Hammertoe of second toe of right foot 03/06/2016  . Onychomycosis 04/16/2015  . 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 Gout attack 03/28/2015    Past Surgical History:  Procedure Laterality Date  . HAMMER TOE SURGERY Right 03/10/2016   Procedure: 2ND RIGHT HAMMER TOE CORRECTION;  Surgeon: Landis Martins, DPM;  Location: [place] [facility];  Service: Podiatry;  Laterality: Right;  . MASS EXCISION Right 03/10/2016   Procedure: EXCISION OF CALLUS 2ND RIGHT TOE;  Surgeon: Landis Martins, DPM;  Location: [facility];  Service: Podiatry;  Laterality: Right;  . NO PAST SURGERIES         Home Medications    Prior to Admission medications   Medication Sig Start Date End Date Taking? Authorizing Provider  allopurinol (ZYLOPRIM) 300 MG tablet Take 1 tablet (300 mg total) by mouth 2 (two) times daily. 09/29/16   Arnoldo Morale, MD  atorvastatin (LIPITOR) 20 MG tablet Take 1 tablet (20 mg total) by mouth daily. 09/29/16   Arnoldo Morale, MD  colchicine 0.6 MG tablet TAKE 2 TABS BY MOUTH AT THE ONSET OF A GOUT ATTACK, MAY REPEAT 1 TAB AFTER 1 HOUR IF SYMPTOMS PERSIST. 09/29/16   Arnoldo Morale, MD  cyclobenzaprine (FLEXERIL) 10 MG tablet Take 1 tablet (10 mg total) by mouth 2 (two) times daily as needed for muscle spasms. 09/11/16   Arnoldo Morale, MD  gabapentin (NEURONTIN) 300 MG capsule Take 1 capsule (300 mg total) by mouth 2 (two) times daily. 09/29/16   Arnoldo Morale, MD  meloxicam (MOBIC) 7.5 MG tablet Take 1 tablet (7.5 mg total) by mouth daily. 09/29/16   Arnoldo Morale, MD  orphenadrine (NORFLEX) 100 MG tablet Take 1 tablet (100 mg total) by mouth 2 (two) times daily as needed for muscle spasms. 07/09/16   Katy Apo, NP  sildenafil (VIAGRA) 50 MG tablet Take 1 tablet (50 mg total) by mouth daily as needed for erectile dysfunction. At least 24 hours between doses. 05/30/16   Arnoldo Morale, MD  terbinafine (LAMISIL) 250 MG tablet Take 1 tablet (250 mg total) by mouth daily. 05/13/16   Landis Martins, DPM  traMADol (ULTRAM) 50 MG tablet Take 1 tablet (50 mg total) by mouth every 12 (twelve) hours as needed. 09/29/16   Arnoldo Morale, MD    Family History No family history on file.  Social History Social History  Substance Use  Topics  . Smoking status: Current Every Day Smoker    Packs/day: 0.25    Types: Cigarettes  . Smokeless tobacco: Never Used     Comment: 8 cigs daily  . Alcohol use No     Allergies   Tramadol and Vicodin [hydrocodone-acetaminophen]   Review of Systems Review of Systems ROS reviewed and all are negative for acute change except as noted in the HPI.  Physical Exam Updated Vital Signs BP 130/66 (BP Location: Right Arm)   Pulse 94   Temp 98 F (36.7 C) (Oral)   Resp 16   Ht 5\' 9"  (1.753 m)   Wt 103 kg (227 lb)   SpO2 97%   BMI 33.52 kg/m   Physical Exam  Constitutional: He is oriented to person, place, and time. Vital signs are normal. He appears well-developed and  well-nourished.  HENT:  Head: Normocephalic and atraumatic.  Right Ear: Hearing normal.  Left Ear: Hearing normal.  Eyes: Pupils are equal, round, and reactive to light. Conjunctivae and EOM are normal.  Neck: Normal range of motion. Neck supple.  Cardiovascular: Normal rate and regular rhythm.   Pulmonary/Chest: Effort normal.  Musculoskeletal: Normal range of motion.  Left Ankle TTP below lateral malleolus. Pain with inversion of ankle. No swelling. No erythema. No palpable or visible deformities. NVI. Distal pulses appreciated.  Neurological: He is alert and oriented to person, place, and time.  Skin: Skin is warm and dry.  Psychiatric: He has a normal mood and affect. His speech is normal and behavior is normal. Thought content normal.  Nursing note and vitals reviewed.    ED Treatments / Results  Labs (all labs ordered are listed, but only abnormal results are displayed) Labs Reviewed - No data to display  EKG  EKG Interpretation None       Radiology Dg Ankle Complete Left  Result Date: 10/04/2016 CLINICAL DATA:  Left ankle pain for 3 days. No reported injury. History of gout. EXAM: LEFT ANKLE COMPLETE - 3+ VIEW COMPARISON:  None. FINDINGS: No fracture or subluxation. No suspicious focal osseous lesions. No cortical or joint erosions. No tophaceous soft tissue calcifications. Tiny plantar left calcaneal spur. No radiopaque foreign body. IMPRESSION: No fracture, malalignment, osseous erosions or tophaceous soft tissue calcifications. Electronically Signed   By: Ilona Sorrel M.D.   On: 10/04/2016 12:52    Procedures Procedures (including critical care time)  Medications Ordered in ED Medications - No data to display   Initial Impression / Assessment and Plan / ED Course  I have reviewed the triage vital signs and the nursing notes.  Pertinent labs & imaging results that were available during my care of the patient were reviewed by me and considered in my medical  decision making (see chart for details).  Final Clinical Impressions(s) / ED Diagnoses     {I have reviewed the relevant previous healthcare records.  {I obtained HPI from historian.   ED Course:  Assessment: .Patient X-Ray negative for obvious fracture or dislocation.  Likely ankle sprain. Doubt gout as pain is localized below lateral malleolus. No swelling or erythema. Pt advised to follow up with orthopedics. Patient given ASO brace while in ED, conservative therapy recommended and discussed. Patient will be discharged home & is agreeable with above plan. Returns precautions discussed. Pt appears safe for discharge.  Disposition/Plan:  DC Home Additional Verbal discharge instructions given and discussed with patient.  Pt Instructed to f/u with PCP in the next week for evaluation and treatment of  symptoms. Return precautions given Pt acknowledges and agrees with plan  Supervising Physician Gareth Morgan, MD  Final diagnoses:  Sprain of left ankle, unspecified ligament, initial encounter    New Prescriptions New Prescriptions   No medications on file     Shary Decamp, Hershal Coria 10/04/16 1300    Gareth Morgan, MD 10/07/16 0010

## 2016-10-04 NOTE — ED Notes (Signed)
Patient transported to x-ray. ?

## 2016-10-04 NOTE — ED Notes (Signed)
EDP at bedside  

## 2016-10-04 NOTE — ED Notes (Signed)
Patient verbalized understanding of discharge instructions and denies any further needs or questions at this time. VS stable. Patient ambulatory with steady gait after applying ASO. Escorted to ED entrance in wheelchair.

## 2016-10-04 NOTE — ED Triage Notes (Signed)
Pt has left foot pain for the last 3 days with no injury, pt has history of gout. Pt denies any redness or swelling in foot. Pt ambulatory in triage.

## 2016-10-07 ENCOUNTER — Ambulatory Visit: Payer: Self-pay | Admitting: Sports Medicine

## 2016-10-07 ENCOUNTER — Encounter: Payer: Self-pay | Admitting: Sports Medicine

## 2016-10-07 DIAGNOSIS — M79675 Pain in left toe(s): Secondary | ICD-10-CM

## 2016-10-07 DIAGNOSIS — M79674 Pain in right toe(s): Secondary | ICD-10-CM

## 2016-10-07 DIAGNOSIS — B351 Tinea unguium: Secondary | ICD-10-CM

## 2016-10-07 MED ORDER — CICLOPIROX 8 % EX SOLN: Freq: Every day | CUTANEOUS | 2 refills | Status: DC

## 2016-10-07 NOTE — Progress Notes (Signed)
Subjective: Terry Poole is a 51 y.o. male patient seen today in office for evaluation of nail fungus. Reports that he is here to go over blood work results. Admits that he went to ER for his left ankle which is now better. Currently using ketocanozole cream to skin. No other issues noted.   Patient Active Problem List   Diagnosis Date Noted  . Type 2 diabetes mellitus ([place]) 08/19/2016  . Hyperlipidemia 08/19/2016  . Back pain 05/13/2016  . Hammertoe of second toe of right foot 03/06/2016  . Onychomycosis 04/16/2015  . Pseudofolliculitis barbae 16/11/9602  . Gout attack 03/28/2015    Current Outpatient Prescriptions on File Prior to Visit  Medication Sig Dispense Refill  . allopurinol (ZYLOPRIM) 300 MG tablet Take 1 tablet (300 mg total) by mouth 2 (two) times daily. 60 tablet 3  . atorvastatin (LIPITOR) 20 MG tablet Take 1 tablet (20 mg total) by mouth daily. 30 tablet 3  . colchicine 0.6 MG tablet TAKE 2 TABS BY MOUTH AT THE ONSET OF A GOUT ATTACK, MAY REPEAT 1 TAB AFTER 1 HOUR IF SYMPTOMS PERSIST. 30 tablet 3  . cyclobenzaprine (FLEXERIL) 10 MG tablet Take 1 tablet (10 mg total) by mouth 2 (two) times daily as needed for muscle spasms. 60 tablet 1  . gabapentin (NEURONTIN) 300 MG capsule Take 1 capsule (300 mg total) by mouth 2 (two) times daily. 60 capsule 3  . ibuprofen (ADVIL,MOTRIN) 600 MG tablet Take 1 tablet (600 mg total) by mouth every 6 (six) hours as needed. 30 tablet 0  . meloxicam (MOBIC) 7.5 MG tablet Take 1 tablet (7.5 mg total) by mouth daily. 30 tablet 1  . orphenadrine (NORFLEX) 100 MG tablet Take 1 tablet (100 mg total) by mouth 2 (two) times daily as needed for muscle spasms. 20 tablet 0  . sildenafil (VIAGRA) 50 MG tablet Take 1 tablet (50 mg total) by mouth daily as needed for erectile dysfunction. At least 24 hours between doses. 30 tablet 0  . terbinafine (LAMISIL) 250 MG tablet Take 1 tablet (250 mg total) by mouth daily. 90 tablet 0  . traMADol (ULTRAM) 50 MG  tablet Take 1 tablet (50 mg total) by mouth every 12 (twelve) hours as needed. 40 tablet 0   Current Facility-Administered Medications on File Prior to Visit  Medication Dose Route Frequency Provider Last Rate Last Dose  . triamcinolone acetonide (KENALOG) 10 MG/ML injection 10 mg  10 mg Other Once Landis Martins, DPM        Allergies  Allergen Reactions  . Tramadol Hives  . Vicodin [Hydrocodone-Acetaminophen] Hives    Objective: There were no vitals filed for this visit.  General: No acute distress, AAOx3   Righ foot: Incision well healed at surgical site at 2nd toe,Capillary fill time <3 seconds in all digits, gross sensation present via light touch to right foot. No pain or crepitation with range of motion right foot. No pain with calf compression.    Nails x 10 thickened and resembling mycosis.   Left foot:No pain with palpation on left. No other acute symptoms.   Assessment and Plan:  Problem List Items Addressed This Visit    None    Visit Diagnoses    Nail fungus    -  Primary   Relevant Medications   ciclopirox (PENLAC) 8 % solution   Other Relevant Orders   Hepatic Function Panel   Toe pain, bilateral          -Patient seen and evaluated -  Reordered a new set of LFTs since Alk phos is elevated; will consider restarting PO lamisil if normal  -Rx Penlac to use meanwhile -Recommend good hygiene habits  -Return in 1 month and to have blood-work done prior to visit or sooner if problems or issues arise   Landis Martins, DPM

## 2016-10-13 MED FILL — $Viagra 50mg tablet: 50 | 30 days supply | Qty: 10 | Fill #0

## 2016-11-04 ENCOUNTER — Ambulatory Visit: Payer: No Typology Code available for payment source | Admitting: Sports Medicine

## 2016-11-08 ENCOUNTER — Emergency Department (HOSPITAL_COMMUNITY)
Admission: EM | Admit: 2016-11-08 | Discharge: 2016-11-09 | Disposition: A | Discharge status: Home / Self Care | Payer: Self-pay | Attending: Emergency Medicine | Admitting: Emergency Medicine

## 2016-11-08 ENCOUNTER — Encounter (HOSPITAL_COMMUNITY): Payer: Self-pay | Admitting: *Deleted

## 2016-11-08 DIAGNOSIS — F17210 Nicotine dependence, cigarettes, uncomplicated: Secondary | ICD-10-CM | POA: Insufficient documentation

## 2016-11-08 DIAGNOSIS — M109 Gout, unspecified: Secondary | ICD-10-CM

## 2016-11-08 DIAGNOSIS — Z79899 Other long term (current) drug therapy: Secondary | ICD-10-CM | POA: Insufficient documentation

## 2016-11-08 DIAGNOSIS — M10072 Idiopathic gout, left ankle and foot: Secondary | ICD-10-CM | POA: Insufficient documentation

## 2016-11-08 DIAGNOSIS — E119 Type 2 diabetes mellitus without complications: Secondary | ICD-10-CM | POA: Insufficient documentation

## 2016-11-08 NOTE — ED Triage Notes (Signed)
C/o lt foot pain for one week  No known injury

## 2016-11-09 MED ORDER — COLCHICINE 0.6 MG PO TABS: 0.6000 mg | ORAL_TABLET | Freq: Three times a day (TID) | ORAL | 0 refills | Status: DC | PRN

## 2016-11-09 MED ORDER — PREDNISONE 20 MG PO TABS: 40.0000 mg | ORAL_TABLET | Freq: Every day | ORAL | 0 refills | Status: AC

## 2016-11-09 MED ORDER — OXYCODONE-ACETAMINOPHEN 5-325 MG PO TABS
1.0000 | ORAL_TABLET | Freq: Once | ORAL | Status: AC
  Administered 2016-11-09: 1 via ORAL
  Filled 2016-11-09: qty 1

## 2016-11-09 MED ORDER — IBUPROFEN 800 MG PO TABS: 600.0000 mg | ORAL_TABLET | Freq: Three times a day (TID) | ORAL | 0 refills | Status: DC | PRN

## 2016-11-09 NOTE — ED Provider Notes (Signed)
[facility] DEPT Provider Note   CSN: [id number] Arrival date & time: 11/08/16  2206     History   Chief Complaint Chief Complaint  Patient presents with  . Foot Pain    HPI Terry Poole is a 51 y.o. male.  HPI Patient's a 51 year old male who reports increasing and ongoing left ankle pain over the past several days.  No fevers or chills.  He does have a history of gout.  His had intermittent flaring of his left ankle.  No recent injury or trauma.  Pain is moderate in severity.  He has no medication at home to treat this.   Past Medical History:  Diagnosis Date  . Bronchitis   . Bronchitis   . Gout   . Hammertoe of second toe of right foot   . Smoker     Patient Active Problem List   Diagnosis Date Noted  . Type 2 diabetes mellitus ([place]) 08/19/2016  . Hyperlipidemia 08/19/2016  . Back pain 05/13/2016  . Hammertoe of second toe of right foot 03/06/2016  . Onychomycosis 04/16/2015  . Pseudofolliculitis barbae 28/31/5176  . Gout attack 03/28/2015    Past Surgical History:  Procedure Laterality Date  . HAMMER TOE SURGERY Right 03/10/2016   Procedure: 2ND RIGHT HAMMER TOE CORRECTION;  Surgeon: Landis Martins, DPM;  Location: [facility];  Service: Podiatry;  Laterality: Right;  . MASS EXCISION Right 03/10/2016   Procedure: EXCISION OF CALLUS 2ND RIGHT TOE;  Surgeon: Landis Martins, DPM;  Location: [facility];  Service: Podiatry;  Laterality: Right;  . NO PAST SURGERIES         Home Medications    Prior to Admission medications   Medication Sig Start Date End Date Taking? Authorizing Provider  allopurinol (ZYLOPRIM) 300 MG tablet Take 1 tablet (300 mg total) by mouth 2 (two) times daily. 09/29/16   Arnoldo Morale, MD  atorvastatin (LIPITOR) 20 MG tablet Take 1 tablet (20 mg total) by mouth daily. 09/29/16   Arnoldo Morale, MD  ciclopirox (PENLAC) 8 % solution Apply topically at bedtime. Apply over nail and surrounding skin. Apply daily  after filing nail 10/07/16   Landis Martins, DPM  colchicine 0.6 MG tablet Take 1 tablet (0.6 mg total) by mouth every 8 (eight) hours as needed. 11/09/16   Jola Schmidt, MD  cyclobenzaprine (FLEXERIL) 10 MG tablet Take 1 tablet (10 mg total) by mouth 2 (two) times daily as needed for muscle spasms. 09/11/16   Arnoldo Morale, MD  gabapentin (NEURONTIN) 300 MG capsule Take 1 capsule (300 mg total) by mouth 2 (two) times daily. 09/29/16   Arnoldo Morale, MD  ibuprofen (ADVIL,MOTRIN) 800 MG tablet Take 1 tablet (800 mg total) by mouth every 8 (eight) hours as needed. 11/09/16   Jola Schmidt, MD  meloxicam (MOBIC) 7.5 MG tablet Take 1 tablet (7.5 mg total) by mouth daily. 09/29/16   Arnoldo Morale, MD  orphenadrine (NORFLEX) 100 MG tablet Take 1 tablet (100 mg total) by mouth 2 (two) times daily as needed for muscle spasms. 07/09/16   Katy Apo, NP  predniSONE (DELTASONE) 20 MG tablet Take 2 tablets (40 mg total) by mouth daily. 11/09/16 11/13/16  Jola Schmidt, MD  sildenafil (VIAGRA) 50 MG tablet Take 1 tablet (50 mg total) by mouth daily as needed for erectile dysfunction. At least 24 hours between doses. 05/30/16   Arnoldo Morale, MD  terbinafine (LAMISIL) 250 MG tablet Take 1 tablet (250 mg total) by mouth daily. 05/13/16  Landis Martins, DPM  traMADol (ULTRAM) 50 MG tablet Take 1 tablet (50 mg total) by mouth every 12 (twelve) hours as needed. 09/29/16   Arnoldo Morale, MD    Family History No family history on file.  Social History Social History  Substance Use Topics  . Smoking status: Current Every Day Smoker    Packs/day: 0.25    Types: Cigarettes  . Smokeless tobacco: Never Used     Comment: 8 cigs daily  . Alcohol use No     Allergies   Tramadol and Vicodin [hydrocodone-acetaminophen]   Review of Systems Review of Systems  All other systems reviewed and are negative.    Physical Exam Updated Vital Signs There were no vitals taken for this visit.  Physical Exam    Constitutional: He is oriented to person, place, and time. He appears well-developed and well-nourished.  HENT:  Head: Normocephalic.  Eyes: EOM are normal.  Neck: Normal range of motion.  Pulmonary/Chest: Effort normal.  Abdominal: He exhibits no distension.  Musculoskeletal: Normal range of motion.  Neurological: He is alert and oriented to person, place, and time.  Psychiatric: He has a normal mood and affect.  Nursing note and vitals reviewed.    ED Treatments / Results  Labs (all labs ordered are listed, but only abnormal results are displayed) Labs Reviewed - No data to display  EKG  EKG Interpretation None       Radiology No results found.  Procedures Procedures (including critical care time)  Medications Ordered in ED Medications  oxyCODONE-acetaminophen (PERCOCET/ROXICET) 5-325 MG per tablet 1 tablet (not administered)     Initial Impression / Assessment and Plan / ED Course  I have reviewed the triage vital signs and the nursing notes.  Pertinent labs & imaging results that were available during my care of the patient were reviewed by me and considered in my medical decision making (see chart for details).     This appears to be gout in the left ankle.  Home with medications for his gout.  Primary care follow-up.  Patient understands return the ER for new or worsening symptoms.  No signs of suggest septic arthritis.  No injury or trauma to necessitate plain films  Final Clinical Impressions(s) / ED Diagnoses   Final diagnoses:  Acute gout of left ankle, unspecified cause    New Prescriptions New Prescriptions   COLCHICINE 0.6 MG TABLET    Take 1 tablet (0.6 mg total) by mouth every 8 (eight) hours as needed.   PREDNISONE (DELTASONE) 20 MG TABLET    Take 2 tablets (40 mg total) by mouth daily.     Jola Schmidt, MD 11/09/16 [phone]

## 2016-11-11 ENCOUNTER — Encounter: Payer: Self-pay | Admitting: Sports Medicine

## 2016-11-11 ENCOUNTER — Ambulatory Visit: Payer: No Typology Code available for payment source | Admitting: Sports Medicine

## 2016-11-11 VITALS — BP 130/85 | HR 89 | Resp 16

## 2016-11-11 DIAGNOSIS — M7752 Other enthesopathy of left foot: Secondary | ICD-10-CM

## 2016-11-11 DIAGNOSIS — M779 Enthesopathy, unspecified: Secondary | ICD-10-CM

## 2016-11-11 DIAGNOSIS — B351 Tinea unguium: Secondary | ICD-10-CM

## 2016-11-11 DIAGNOSIS — M778 Other enthesopathies, not elsewhere classified: Secondary | ICD-10-CM

## 2016-11-11 DIAGNOSIS — E1142 Type 2 diabetes mellitus with diabetic polyneuropathy: Secondary | ICD-10-CM

## 2016-11-11 DIAGNOSIS — M7751 Other enthesopathy of right foot: Secondary | ICD-10-CM

## 2016-11-11 MED ORDER — DEXAMETHASONE SODIUM PHOSPHATE 120 MG/30ML IJ SOLN: 4.0000 mg | Freq: Once | INTRAMUSCULAR | Status: DC

## 2016-11-11 MED ORDER — TRIAMCINOLONE ACETONIDE 10 MG/ML IJ SUSP: 10.0000 mg | Freq: Once | INTRAMUSCULAR | Status: DC

## 2016-11-11 NOTE — Progress Notes (Signed)
Subjective: Terry Poole is a 51 y.o. male patient seen today in office for evaluation of nail fungus and for pain in both feet. Reports pain on left is at ankle and pain on right is at baby toe. States went to ER and was given gout medications which are not helping. States that he is on his feet all the time with work and wants orthotics. Denies any acute injury. No other issues noted.   Patient Active Problem List   Diagnosis Date Noted  . Type 2 diabetes mellitus ([place]) 08/19/2016  . Hyperlipidemia 08/19/2016  . Back pain 05/13/2016  . Hammertoe of second toe of right foot 03/06/2016  . Onychomycosis 04/16/2015  . Pseudofolliculitis barbae 10/93/2355  . Gout attack 03/28/2015    Current Outpatient Prescriptions on File Prior to Visit  Medication Sig Dispense Refill  . allopurinol (ZYLOPRIM) 300 MG tablet Take 1 tablet (300 mg total) by mouth 2 (two) times daily. 60 tablet 3  . atorvastatin (LIPITOR) 20 MG tablet Take 1 tablet (20 mg total) by mouth daily. 30 tablet 3  . ciclopirox (PENLAC) 8 % solution Apply topically at bedtime. Apply over nail and surrounding skin. Apply daily after filing nail 6.6 mL 2  . colchicine 0.6 MG tablet Take 1 tablet (0.6 mg total) by mouth every 8 (eight) hours as needed. 20 tablet 0  . cyclobenzaprine (FLEXERIL) 10 MG tablet Take 1 tablet (10 mg total) by mouth 2 (two) times daily as needed for muscle spasms. 60 tablet 1  . gabapentin (NEURONTIN) 300 MG capsule Take 1 capsule (300 mg total) by mouth 2 (two) times daily. 60 capsule 3  . ibuprofen (ADVIL,MOTRIN) 800 MG tablet Take 1 tablet (800 mg total) by mouth every 8 (eight) hours as needed. 12 tablet 0  . meloxicam (MOBIC) 7.5 MG tablet Take 1 tablet (7.5 mg total) by mouth daily. 30 tablet 1  . orphenadrine (NORFLEX) 100 MG tablet Take 1 tablet (100 mg total) by mouth 2 (two) times daily as needed for muscle spasms. 20 tablet 0  . predniSONE (DELTASONE) 20 MG tablet Take 2 tablets (40 mg total) by  mouth daily. 8 tablet 0  . sildenafil (VIAGRA) 50 MG tablet Take 1 tablet (50 mg total) by mouth daily as needed for erectile dysfunction. At least 24 hours between doses. 30 tablet 0  . terbinafine (LAMISIL) 250 MG tablet Take 1 tablet (250 mg total) by mouth daily. 90 tablet 0  . traMADol (ULTRAM) 50 MG tablet Take 1 tablet (50 mg total) by mouth every 12 (twelve) hours as needed. 40 tablet 0   Current Facility-Administered Medications on File Prior to Visit  Medication Dose Route Frequency Provider Last Rate Last Dose  . triamcinolone acetonide (KENALOG) 10 MG/ML injection 10 mg  10 mg Other Once Landis Martins, DPM        Allergies  Allergen Reactions  . Tramadol Hives  . Vicodin [Hydrocodone-Acetaminophen] Hives    Objective: There were no vitals filed for this visit.  General: No acute distress, AAOx3   Righ foot: Incision well healed at surgical site at 2nd toe,Capillary fill time <3 seconds in all digits, gross sensation present via light touch to right foot. No pain or crepitation with range of motion right foot however there is pain at 5th MTPJ on right. No pain with calf compression.    Nails x 10 thickened and resembling mycosis.   Left foot:+ pain with palpation on left at lateral ankle gutter. No other acute symptoms.  Assessment and Plan:  Problem List Items Addressed This Visit    None    Visit Diagnoses    Nail fungus    -  Primary   Relevant Orders   Hepatic Function Panel   Capsulitis of foot, right       Relevant Medications   triamcinolone acetonide (KENALOG) 10 MG/ML injection 10 mg (Start on 11/11/2016 12:15 PM)   Capsulitis of ankle, left       Relevant Medications   dexamethasone (DECADRON) injection 4 mg (Start on 11/11/2016 12:15 PM)   triamcinolone acetonide (KENALOG) 10 MG/ML injection 10 mg (Start on 11/11/2016 12:15 PM)   Diabetic polyneuropathy associated with type 2 diabetes mellitus ([place])          -Patient seen and evaluated -Previous  xrays reviewed -After oral consent and aseptic prep, injected a mixture containing 1 ml of 2%  plain lidocaine, 1 ml 0.5% plain marcaine, 0.5 ml of kenalog 10 and 0.5 ml of dexamethasone phosphate into Right 5th MTPJ and Left lateral ankle without complication. Post-injection care discussed with patient.  -Reordered a new set of LFTs since Alk phos is elevated; will consider restarting PO lamisil if normal  -Continue with Penlac to use meanwhile -Recommend good hygiene habits  -Recommend patient to wait until he has insurance for any possible assistance with cost of orthotics  -Return as needed or sooner if problems or issues arise. Advised patient to call once he has gotten his bloodwork done for [place] to send lamisil if normal.   Landis Martins, DPM

## 2016-11-19 ENCOUNTER — Ambulatory Visit: Payer: Self-pay | Attending: Family Medicine | Admitting: Family Medicine

## 2016-11-19 ENCOUNTER — Encounter: Payer: Self-pay | Admitting: Family Medicine

## 2016-11-19 VITALS — BP 153/74 | HR 74 | Temp 97.7°F | Ht 69.0 in | Wt 214.0 lb

## 2016-11-19 DIAGNOSIS — Z79899 Other long term (current) drug therapy: Secondary | ICD-10-CM | POA: Insufficient documentation

## 2016-11-19 DIAGNOSIS — M25572 Pain in left ankle and joints of left foot: Secondary | ICD-10-CM | POA: Insufficient documentation

## 2016-11-19 DIAGNOSIS — E7800 Pure hypercholesterolemia, unspecified: Secondary | ICD-10-CM | POA: Insufficient documentation

## 2016-11-19 DIAGNOSIS — I10 Essential (primary) hypertension: Secondary | ICD-10-CM | POA: Insufficient documentation

## 2016-11-19 DIAGNOSIS — E119 Type 2 diabetes mellitus without complications: Secondary | ICD-10-CM | POA: Insufficient documentation

## 2016-11-19 DIAGNOSIS — M5432 Sciatica, left side: Secondary | ICD-10-CM | POA: Insufficient documentation

## 2016-11-19 DIAGNOSIS — Z23 Encounter for immunization: Secondary | ICD-10-CM

## 2016-11-19 DIAGNOSIS — Z760 Encounter for issue of repeat prescription: Secondary | ICD-10-CM | POA: Insufficient documentation

## 2016-11-19 DIAGNOSIS — M722 Plantar fascial fibromatosis: Secondary | ICD-10-CM | POA: Insufficient documentation

## 2016-11-19 DIAGNOSIS — M1A00X0 Idiopathic chronic gout, unspecified site, without tophus (tophi): Secondary | ICD-10-CM | POA: Insufficient documentation

## 2016-11-19 DIAGNOSIS — Z885 Allergy status to narcotic agent status: Secondary | ICD-10-CM | POA: Insufficient documentation

## 2016-11-19 LAB — GLUCOSE, POCT (MANUAL RESULT ENTRY): POC GLUCOSE: 194 mg/dL — AB (ref 70–99)

## 2016-11-19 MED ORDER — ATORVASTATIN CALCIUM 20 MG PO TABS: 20.0000 mg | ORAL_TABLET | Freq: Every day | ORAL | 3 refills | Status: DC

## 2016-11-19 MED ORDER — MELOXICAM 7.5 MG PO TABS: 7.5000 mg | ORAL_TABLET | Freq: Every day | ORAL | 1 refills | Status: DC

## 2016-11-19 MED ORDER — GABAPENTIN 300 MG PO CAPS: 300.0000 mg | ORAL_CAPSULE | Freq: Two times a day (BID) | ORAL | 3 refills | Status: DC

## 2016-11-19 MED ORDER — ALLOPURINOL 300 MG PO TABS: 300.0000 mg | ORAL_TABLET | Freq: Two times a day (BID) | ORAL | 3 refills | Status: DC

## 2016-11-19 MED ORDER — LISINOPRIL 5 MG PO TABS: 5.0000 mg | ORAL_TABLET | Freq: Every day | ORAL | 3 refills | Status: DC

## 2016-11-19 MED FILL — ?ATORVASTATIN 20 MG TABLET: 20 | 30 days supply | Qty: 30 | Fill #0

## 2016-11-19 MED FILL — MELOXICAM 7.5 MG TABLET: 7.5 | 30 days supply | Qty: 30 | Fill #0

## 2016-11-19 MED FILL — ?ALLOPURINOL 300 MG TABLET: 300 | 30 days supply | Qty: 60 | Fill #0

## 2016-11-19 MED FILL — ?LISINOPRIL 5 MG TABLET: 5 | 30 days supply | Qty: 30 | Fill #0

## 2016-11-19 MED FILL — GABAPENTIN 300 MG CAPSULE: 300 | 30 days supply | Qty: 60 | Fill #0

## 2016-11-19 NOTE — Progress Notes (Signed)
Subjective:  Patient ID: Terry Poole, male    DOB: [birth year]  Age: 51 y.o. MRN: [id number]  CC: Medication Refill and Diabetes   HPI Terry Poole  is a 51 year old male with a history of type 2 diabetes mellitus (A1c 6.5, diet controlled) Hyperlipidemia, gout, second right hammertoe correction who presents today for a follow-up visit.  His blood pressure is elevated today at 153/74 and was also elevated at his previous visit at 141/80 and he has no known history of hypertension.  Last week he was seen at the ED for acute gout of his left ankle which was treated with prednisone and colchicine as he reports improvement of gout flare however he complains of soreness from his left medial leg to his left foot and complains of pain in his left sole every morning once he steps out of bed pain improves as the day progresses.  He has been compliant with his statin and denies myalgias.  Last month he had a visit with podiatry at which time he was treated for nail fungus with ciclopirox    Past Medical History:  Diagnosis Date  . Bronchitis   . Bronchitis   . Gout   . Hammertoe of second toe of right foot   . Smoker     Past Surgical History:  Procedure Laterality Date  . HAMMER TOE SURGERY Right 03/10/2016   Procedure: 2ND RIGHT HAMMER TOE CORRECTION;  Surgeon: Landis Martins, DPM;  Location: [facility];  Service: Podiatry;  Laterality: Right;  . MASS EXCISION Right 03/10/2016   Procedure: EXCISION OF CALLUS 2ND RIGHT TOE;  Surgeon: Landis Martins, DPM;  Location: [place] [facility];  Service: Podiatry;  Laterality: Right;  . NO PAST SURGERIES      Allergies  Allergen Reactions  . Tramadol Hives  . Vicodin [Hydrocodone-Acetaminophen] Hives     Outpatient Medications Prior to Visit  Medication Sig Dispense Refill  . ciclopirox (PENLAC) 8 % solution Apply topically at bedtime. Apply over nail and surrounding skin. Apply daily after filing nail 6.6 mL 2  .  colchicine 0.6 MG tablet Take 1 tablet (0.6 mg total) by mouth every 8 (eight) hours as needed. 20 tablet 0  . cyclobenzaprine (FLEXERIL) 10 MG tablet Take 1 tablet (10 mg total) by mouth 2 (two) times daily as needed for muscle spasms. 60 tablet 1  . ibuprofen (ADVIL,MOTRIN) 800 MG tablet Take 1 tablet (800 mg total) by mouth every 8 (eight) hours as needed. 12 tablet 0  . sildenafil (VIAGRA) 50 MG tablet Take 1 tablet (50 mg total) by mouth daily as needed for erectile dysfunction. At least 24 hours between doses. 30 tablet 0  . terbinafine (LAMISIL) 250 MG tablet Take 1 tablet (250 mg total) by mouth daily. 90 tablet 0  . traMADol (ULTRAM) 50 MG tablet Take 1 tablet (50 mg total) by mouth every 12 (twelve) hours as needed. 40 tablet 0  . allopurinol (ZYLOPRIM) 300 MG tablet Take 1 tablet (300 mg total) by mouth 2 (two) times daily. 60 tablet 3  . atorvastatin (LIPITOR) 20 MG tablet Take 1 tablet (20 mg total) by mouth daily. 30 tablet 3  . gabapentin (NEURONTIN) 300 MG capsule Take 1 capsule (300 mg total) by mouth 2 (two) times daily. 60 capsule 3  . meloxicam (MOBIC) 7.5 MG tablet Take 1 tablet (7.5 mg total) by mouth daily. 30 tablet 1  . orphenadrine (NORFLEX) 100 MG tablet Take 1 tablet (100 mg total) by mouth  2 (two) times daily as needed for muscle spasms. 20 tablet 0   Facility-Administered Medications Prior to Visit  Medication Dose Route Frequency Provider Last Rate Last Dose  . dexamethasone (DECADRON) injection 4 mg  4 mg Intra-articular Once Cannon Kettle, [place], DPM      . triamcinolone acetonide (KENALOG) 10 MG/ML injection 10 mg  10 mg Other Once [place], [place]itorya, DPM      . triamcinolone acetonide (KENALOG) 10 MG/ML injection 10 mg  10 mg Other Once Landis Martins, DPM        ROS Review of Systems  Constitutional: Negative for activity change and appetite change.  HENT: Negative for sinus pressure and sore throat.   Eyes: Negative for visual disturbance.  Respiratory: Negative  for cough, chest tightness and shortness of breath.   Cardiovascular: Negative for chest pain and leg swelling.  Gastrointestinal: Negative for abdominal distention, abdominal pain, constipation and diarrhea.  Endocrine: Negative.   Genitourinary: Negative for dysuria.  Musculoskeletal:       See hpi  Skin: Negative for rash.  Allergic/Immunologic: Negative.   Neurological: Negative for weakness, light-headedness and numbness.  Psychiatric/Behavioral: Negative for dysphoric mood and suicidal ideas.    Objective:  BP (!) 153/74   Pulse 74   Temp 97.7 F (36.5 C) (Oral)   Ht 5\' 9"  (1.753 m)   Wt 214 lb (97.1 kg)   SpO2 99%   BMI 31.60 kg/m   BP/Weight 11/19/2016 11/11/2016 28/04/1515  Systolic BP 616 073 710  Diastolic BP 74 85 94  Wt. (Lbs) 214 - -  BMI 31.6 - -      Physical Exam  Constitutional: He is oriented to person, place, and time. He appears well-developed and well-nourished.  Cardiovascular: Normal rate, normal heart sounds and intact distal pulses.   No murmur heard. Pulmonary/Chest: Effort normal and breath sounds normal. He has no wheezes. He has no rales. He exhibits no tenderness.  Abdominal: Soft. Bowel sounds are normal. He exhibits no distension and no mass. There is no tenderness.  Musculoskeletal: Normal range of motion. He exhibits tenderness (slight tenderness on palpation of left heel and and dorsiflexion).  Neurological: He is alert and oriented to person, place, and time.  Psychiatric: He has a normal mood and affect.     CMP Latest Ref Rng & Units 09/15/2016 05/13/2016 09/21/2015  Glucose 65 - 99 mg/dL 87 92 149(H)  BUN 6 - 24 mg/dL 11 7 9   Creatinine 0.76 - 1.27 mg/dL 1.08 0.94 0.94  Sodium 134 - 144 mmol/L 141 141 141  Potassium 3.5 - 5.2 mmol/L 4.4 4.2 3.9  Chloride 96 - 106 mmol/L 104 100 105  CO2 20 - 29 mmol/L 23 25 25   Calcium 8.7 - 10.2 mg/dL 9.1 9.8 9.3  Total Protein 6.0 - 8.5 g/dL 6.9 7.3 7.0  Total Bilirubin 0.0 - 1.2 mg/dL 0.3  0.4 0.4  Alkaline Phos 39 - 117 IU/L 131(H) 102 128(H)  AST 0 - 40 IU/L 27 39 22  ALT 0 - 44 IU/L 44 78(H) 44    Lipid Panel     Component Value Date/Time   CHOL 218 (H) 05/13/2016 1627   TRIG 193 (H) 05/13/2016 1627   HDL 57 05/13/2016 1627   CHOLHDL 3.8 05/13/2016 1627   CHOLHDL 2.6 09/21/2015 1053   VLDL 47 (H) 09/21/2015 1053   LDLCALC 122 (H) 05/13/2016 1627    Lab Results  Component Value Date   HGBA1C 6.5 09/30/2016    Assessment &  Plan:   1. Type 2 diabetes mellitus without complication, without long-term current use of insulin (HCC) Diet controlled with A1c of 6.5 Continue diabetic diet, lifestyle modifications, exercise 150 minutes per week - POCT glucose (manual entry)  2. Essential hypertension Uncontrolled Commence lisinopril Low-sodium, DASH diet - lisinopril (PRINIVIL,ZESTRIL) 5 MG tablet; Take 1 tablet (5 mg total) by mouth daily.  Dispense: 90 tablet; Refill: 3 - Basic Metabolic Panel; Future  3. Pure hypercholesterolemia Uncontrolled Low-cholesterol diet, lifestyle modifications Lipid panel at next visit - atorvastatin (LIPITOR) 20 MG tablet; Take 1 tablet (20 mg total) by mouth daily.  Dispense: 30 tablet; Refill: 3  4. Sciatica of left side Stable - gabapentin (NEURONTIN) 300 MG capsule; Take 1 capsule (300 mg total) by mouth 2 (two) times daily.  Dispense: 60 capsule; Refill: 3  5. Idiopathic chronic gout without tophus, unspecified site No acute flares - allopurinol (ZYLOPRIM) 300 MG tablet; Take 1 tablet (300 mg total) by mouth 2 (two) times daily.  Dispense: 60 tablet; Refill: 3  6. Plantar fasciitis of left foot Advised to use insoles He currently sees a podiatrist and may need to receive corticosteroid injections if symptoms persist - meloxicam (MOBIC) 7.5 MG tablet; Take 1 tablet (7.5 mg total) by mouth daily.  Dispense: 30 tablet; Refill: 1   Meds ordered this encounter  Medications  . atorvastatin (LIPITOR) 20 MG tablet    Sig:  Take 1 tablet (20 mg total) by mouth daily.    Dispense:  30 tablet    Refill:  3  . gabapentin (NEURONTIN) 300 MG capsule    Sig: Take 1 capsule (300 mg total) by mouth 2 (two) times daily.    Dispense:  60 capsule    Refill:  3  . meloxicam (MOBIC) 7.5 MG tablet    Sig: Take 1 tablet (7.5 mg total) by mouth daily.    Dispense:  30 tablet    Refill:  1  . allopurinol (ZYLOPRIM) 300 MG tablet    Sig: Take 1 tablet (300 mg total) by mouth 2 (two) times daily.    Dispense:  60 tablet    Refill:  3    Discontinue previous dose  . lisinopril (PRINIVIL,ZESTRIL) 5 MG tablet    Sig: Take 1 tablet (5 mg total) by mouth daily.    Dispense:  90 tablet    Refill:  3    Follow-up: Return in about 3 months (around 02/19/2017) for Follow-up for chronic medical conditions.   Arnoldo Morale MD

## 2016-11-19 NOTE — Patient Instructions (Signed)

## 2016-11-21 ENCOUNTER — Ambulatory Visit: Payer: No Typology Code available for payment source | Admitting: Podiatry

## 2016-12-12 ENCOUNTER — Other Ambulatory Visit: Payer: Self-pay | Admitting: Sports Medicine

## 2016-12-12 DIAGNOSIS — B351 Tinea unguium: Secondary | ICD-10-CM

## 2016-12-12 MED FILL — $Viagra 50mg tablet: 50 | 30 days supply | Qty: 10 | Fill #1

## 2016-12-12 MED FILL — ?ALLOPURINOL 300 MG TABLET: 300 | 30 days supply | Qty: 60 | Fill #1

## 2016-12-15 ENCOUNTER — Encounter (HOSPITAL_COMMUNITY): Payer: Self-pay | Admitting: Emergency Medicine

## 2016-12-15 ENCOUNTER — Emergency Department (HOSPITAL_COMMUNITY)
Admission: EM | Admit: 2016-12-15 | Discharge: 2016-12-15 | Disposition: A | Discharge status: Home / Self Care | Payer: Self-pay | Attending: Emergency Medicine | Admitting: Emergency Medicine

## 2016-12-15 DIAGNOSIS — Z79899 Other long term (current) drug therapy: Secondary | ICD-10-CM | POA: Insufficient documentation

## 2016-12-15 DIAGNOSIS — F17210 Nicotine dependence, cigarettes, uncomplicated: Secondary | ICD-10-CM | POA: Insufficient documentation

## 2016-12-15 DIAGNOSIS — E119 Type 2 diabetes mellitus without complications: Secondary | ICD-10-CM | POA: Insufficient documentation

## 2016-12-15 DIAGNOSIS — M79672 Pain in left foot: Secondary | ICD-10-CM | POA: Insufficient documentation

## 2016-12-15 DIAGNOSIS — I10 Essential (primary) hypertension: Secondary | ICD-10-CM | POA: Insufficient documentation

## 2016-12-15 MED ORDER — NAPROXEN 500 MG PO TABS: 500.0000 mg | ORAL_TABLET | Freq: Two times a day (BID) | ORAL | 0 refills | Status: DC

## 2016-12-15 MED ORDER — ACETAMINOPHEN-CODEINE #3 300-30 MG PO TABS
2.0000 | ORAL_TABLET | Freq: Once | ORAL | Status: AC
  Administered 2016-12-15: 2 via ORAL
  Filled 2016-12-15: qty 2

## 2016-12-15 MED ORDER — ACETAMINOPHEN-CODEINE #3 300-30 MG PO TABS: 1.0000 | ORAL_TABLET | Freq: Four times a day (QID) | ORAL | 0 refills | Status: DC | PRN

## 2016-12-15 MED FILL — ACETAMINOPHEN/COD #3 TABLET: 300-30 | 1 days supply | Qty: 15 | Fill #0

## 2016-12-15 MED FILL — NAPROXEN 500 MG TABLET: 500 | 10 days supply | Qty: 20 | Fill #0

## 2016-12-15 NOTE — ED Triage Notes (Signed)
Patient reports gout flare up this evening at left ankle/left heel with pain and mild swelling .

## 2016-12-15 NOTE — Discharge Instructions (Signed)
Naproxen as prescribed.  Tylenol No. 3 as prescribed as needed for pain not relieved with naproxen.  Follow-up with the foot center in the next 1-2 weeks.

## 2016-12-15 NOTE — ED Provider Notes (Signed)
[facility] Provider Note   CSN: [id number] Arrival date & time: 12/15/16  0118     History   Chief Complaint Chief Complaint  Patient presents with  . Ankle Pain    Heel Pain     HPI Terry Poole is a 51 y.o. male.  Patient is a 51 year old male with history of hypertension, borderline diabetes, and flatfootedness.  He presents today for evaluation of left foot pain.  This has been present for nearly a year, but worsening over the past several weeks.  He was recently treated for gout, but does not believe this flareup to be related to gout.  He denies any new injury or trauma.  His pain is mainly on the bottom of his foot and heel radiating near the Achilles.  It is worse when he ambulates.   The history is provided by the patient.  Ankle Pain   The incident occurred more than 1 week ago. There was no injury mechanism. The pain is present in the left foot. The pain is moderate. The pain has been constant since onset. Pertinent negatives include no numbness and no inability to bear weight. He has tried nothing for the symptoms. The treatment provided no relief.    Past Medical History:  Diagnosis Date  . Bronchitis   . Bronchitis   . Gout   . Hammertoe of second toe of right foot   . Smoker     Patient Active Problem List   Diagnosis Date Noted  . Hypertension 11/19/2016  . Type 2 diabetes mellitus ([place]) 08/19/2016  . Hyperlipidemia 08/19/2016  . Back pain 05/13/2016  . Hammertoe of second toe of right foot 03/06/2016  . Onychomycosis 04/16/2015  . 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 Gout attack 03/28/2015    Past Surgical History:  Procedure Laterality Date  . NO PAST SURGERIES         Home Medications    Prior to Admission medications   Medication Sig Start Date End Date Taking? Authorizing Provider  allopurinol (ZYLOPRIM) 300 MG tablet Take 1 tablet (300 mg total) by mouth 2 (two) times daily. 11/19/16   Arnoldo Morale, MD  atorvastatin (LIPITOR) 20 MG tablet Take 1 tablet (20 mg total) by mouth daily. 11/19/16   Arnoldo Morale, MD  ciclopirox (PENLAC) 8 % solution Apply topically at bedtime. Apply over nail and surrounding skin. Apply daily after filing nail 10/07/16   Landis Martins, DPM  colchicine 0.6 MG tablet Take 1 tablet (0.6 mg total) by mouth every 8 (eight) hours as needed. 11/09/16   Jola Schmidt, MD  cyclobenzaprine (FLEXERIL) 10 MG tablet Take 1 tablet (10 mg total) by mouth 2 (two) times daily as needed for muscle spasms. 09/11/16   Arnoldo Morale, MD  gabapentin (NEURONTIN) 300 MG capsule Take 1 capsule (300 mg total) by mouth 2 (two) times daily. 11/19/16   Arnoldo Morale, MD  ibuprofen (ADVIL,MOTRIN) 800 MG tablet Take 1 tablet (800 mg total) by mouth every 8 (eight) hours as needed. 11/09/16   Jola Schmidt, MD  lisinopril (PRINIVIL,ZESTRIL) 5 MG tablet Take 1 tablet (5 mg total) by mouth daily. 11/19/16   Arnoldo Morale, MD  meloxicam (MOBIC) 7.5 MG tablet Take 1 tablet (7.5 mg total) by mouth daily. 11/19/16   Arnoldo Morale, MD  sildenafil (VIAGRA) 50 MG tablet Take 1 tablet (50 mg total) by mouth daily as needed for erectile dysfunction. At least 24 hours between doses. 05/30/16   Arnoldo Morale, MD  terbinafine (LAMISIL) 250 MG tablet Take 1 tablet (250 mg total) by mouth daily. 05/13/16   Landis Martins, DPM  traMADol (ULTRAM) 50 MG tablet Take 1 tablet (50 mg total) by mouth every 12 (twelve) hours as needed. 09/29/16   Arnoldo Morale, MD    Family History No family history on file.  Social History Social History   Tobacco Use  . Smoking status: Current Every Day Smoker    Packs/day: 0.25    Types: Cigarettes  . Smokeless tobacco: Never Used  . Tobacco comment: 8 cigs daily  Substance Use Topics  . Alcohol use: No  . Drug use: No     Allergies   Tramadol and Vicodin [hydrocodone-acetaminophen]   Review of Systems Review of Systems  Neurological: Negative for numbness.    All other systems reviewed and are negative.    Physical Exam Updated Vital Signs BP (!) 166/103 (BP Location: Right Arm)   Pulse 78   Temp (!) 97.5 F (36.4 C) (Oral)   Resp 14   SpO2 99%   Physical Exam  Constitutional: He is oriented to person, place, and time. He appears well-developed and well-nourished. No distress.  HENT:  Head: Normocephalic and atraumatic.  Neck: Normal range of motion. Neck supple.  Musculoskeletal:  The left foot has a loss of the normal arch.  He has tenderness to palpation over the soft tissues on the plantar aspect.  He has good range of motion.  There is no significant warmth, erythema.  Neurological: He is alert and oriented to person, place, and time.  Skin: Skin is warm and dry. He is not diaphoretic.  Nursing note and vitals reviewed.    ED Treatments / Results  Labs (all labs ordered are listed, but only abnormal results are displayed) Labs Reviewed - No data to display  EKG  EKG Interpretation None       Radiology No results found.  Procedures Procedures (including critical care time)  Medications Ordered in ED Medications - No data to display   Initial Impression / Assessment and Plan / ED Course  I have reviewed the triage vital signs and the nursing notes.  Pertinent labs & imaging results that were available during my care of the patient were reviewed by me and considered in my medical decision making (see chart for details).  This appears to be some sort of soft tissue inflammation, possibly plantar fasciitis.  This will be treated with naproxen, tramadol, and follow-up with the foot center he normally goes to.  Final Clinical Impressions(s) / ED Diagnoses   Final diagnoses:  None    ED Discharge Orders    None       Veryl Speak, MD 12/15/16 [phone]

## 2016-12-15 NOTE — ED Notes (Signed)
Sandwich and juice given.

## 2016-12-16 ENCOUNTER — Telehealth: Payer: Self-pay | Admitting: Sports Medicine

## 2016-12-16 ENCOUNTER — Telehealth: Payer: Self-pay | Admitting: *Deleted

## 2016-12-16 NOTE — Telephone Encounter (Signed)
Pt stated he had some question about his left foot. 12/16/2016-Unable to leave a message the mailbox had not been set up.

## 2016-12-16 NOTE — Telephone Encounter (Signed)
Pt states his doctor states he has gout in his left foot and he wanted to know if Dr. Cannon Kettle thought he had gout in his left foot. Unable to leave a message pt's mailbox is not set up.

## 2016-12-16 NOTE — Telephone Encounter (Signed)
I'm calling to see how I can go about get my medical records. Do I have to come in to sign paperwork and all to get them? Please call me back at [phone]. Thank you.

## 2016-12-17 NOTE — Telephone Encounter (Signed)
Called pt back in regards to his phone calls requesting his medical records. I asked the pt if he only wanted his office visit notes and operative report. He said he wanted anything pertaining to his left foot. I told him I could print out all his notes from his initial visit which was 13 November 2015 - Present as well as the operative report. Pt stated he would like that. I told him he needed to fill out and sign a medical records release form and that I could e-mail it or fax it to him, or he could come to the office to fill it out. Pt stated he would rather come to the office to fill out the form. I told him I would go ahead and print everything out that he is wanting and I would have it up at the front desk for him to come in for anytime after 1 pm today. Pt stated that was fine.

## 2016-12-17 NOTE — Telephone Encounter (Signed)
Yes, this is [employer]. I was calling to uh get my medical records. Uh, my birthday is [birth year] and my contact number is [phone]. Thank you.

## 2017-01-06 ENCOUNTER — Encounter: Payer: Self-pay | Admitting: Sports Medicine

## 2017-01-06 ENCOUNTER — Telehealth: Payer: Self-pay | Admitting: Sports Medicine

## 2017-01-06 NOTE — Telephone Encounter (Signed)
I need some information. I need to know when I started having trouble with my feet and my toe. I also need to know when the surgery started and ended as well as the information for the three months I was out. I need that information. Please call me back at [phone]. Thank you. Bye.

## 2017-01-06 NOTE — Telephone Encounter (Signed)
Patient left voicemail this morning wanting information in regards to his hammertoe surgery form back in February. Patient came into the clinic around 10:30 and requested a letter written stating that he had surgery with Dr. Cannon Kettle back in February and was out of work from February to the first week of May. Letter was okay'd to be written by Darrick Penna.

## 2017-01-14 MED FILL — CICLOPIROX 8% SOLUTION: 8 | 6 days supply | Qty: 7 | Fill #0

## 2017-01-14 MED FILL — $Viagra 50mg tablet: 50 | 30 days supply | Qty: 10 | Fill #2

## 2017-01-14 MED FILL — ?ALLOPURINOL 300 MG TABLET: 300 | 30 days supply | Qty: 60 | Fill #2

## 2017-01-20 MED FILL — CYCLOBENZAPRINE 10 MG TAB: 10 | 30 days supply | Qty: 60 | Fill #1

## 2017-01-21 ENCOUNTER — Telehealth: Payer: Self-pay | Admitting: Sports Medicine

## 2017-01-21 NOTE — Telephone Encounter (Signed)
I was calling to see if I could get a refill of the Lamisil for the toenail fungus. Would you please give me a call at [phone].

## 2017-01-21 NOTE — Telephone Encounter (Signed)
I informed pt that in April 2018 he was prescribed #90 Lamisil that is the therapeutic dosing and it takes 6-9 months for healthy outgrow to be seen, but if he felt he was not progressing he would benefit from an appt with Dr. Cannon Kettle. Pt states understanding and wants an appt. I transferred pt to schedulers.

## 2017-02-16 ENCOUNTER — Ambulatory Visit: Payer: BLUE CROSS/BLUE SHIELD | Attending: Family Medicine | Admitting: Family Medicine

## 2017-02-16 VITALS — BP 154/82 | HR 84 | Temp 98.1°F | Ht 69.0 in | Wt 216.6 lb

## 2017-02-16 DIAGNOSIS — Z885 Allergy status to narcotic agent status: Secondary | ICD-10-CM | POA: Insufficient documentation

## 2017-02-16 DIAGNOSIS — M62838 Other muscle spasm: Secondary | ICD-10-CM | POA: Diagnosis not present

## 2017-02-16 DIAGNOSIS — Z888 Allergy status to other drugs, medicaments and biological substances status: Secondary | ICD-10-CM | POA: Insufficient documentation

## 2017-02-16 DIAGNOSIS — M1A00X0 Idiopathic chronic gout, unspecified site, without tophus (tophi): Secondary | ICD-10-CM | POA: Diagnosis not present

## 2017-02-16 DIAGNOSIS — Z79899 Other long term (current) drug therapy: Secondary | ICD-10-CM | POA: Diagnosis not present

## 2017-02-16 DIAGNOSIS — E119 Type 2 diabetes mellitus without complications: Secondary | ICD-10-CM | POA: Insufficient documentation

## 2017-02-16 DIAGNOSIS — M25552 Pain in left hip: Secondary | ICD-10-CM | POA: Diagnosis not present

## 2017-02-16 DIAGNOSIS — M545 Low back pain: Secondary | ICD-10-CM | POA: Insufficient documentation

## 2017-02-16 DIAGNOSIS — I10 Essential (primary) hypertension: Secondary | ICD-10-CM | POA: Insufficient documentation

## 2017-02-16 DIAGNOSIS — E7800 Pure hypercholesterolemia, unspecified: Secondary | ICD-10-CM | POA: Insufficient documentation

## 2017-02-16 LAB — GLUCOSE, POCT (MANUAL RESULT ENTRY): POC GLUCOSE: 109 mg/dL — AB (ref 70–99)

## 2017-02-16 LAB — POCT GLYCOSYLATED HEMOGLOBIN (HGB A1C): Hemoglobin A1C: 6.3

## 2017-02-16 MED ORDER — ALLOPURINOL 300 MG PO TABS: 300.0000 mg | ORAL_TABLET | Freq: Two times a day (BID) | ORAL | 3 refills | Status: DC

## 2017-02-16 MED ORDER — KETOROLAC TROMETHAMINE 60 MG/2ML IM SOLN
60.0000 mg | Freq: Once | INTRAMUSCULAR | Status: AC
  Administered 2017-02-16: 60 mg via INTRAMUSCULAR

## 2017-02-16 MED ORDER — ATORVASTATIN CALCIUM 20 MG PO TABS: 20.0000 mg | ORAL_TABLET | Freq: Every day | ORAL | 3 refills | Status: DC

## 2017-02-16 MED ORDER — CYCLOBENZAPRINE HCL 10 MG PO TABS: 10.0000 mg | ORAL_TABLET | Freq: Two times a day (BID) | ORAL | 3 refills | Status: DC | PRN

## 2017-02-16 MED ORDER — TRAMADOL HCL 50 MG PO TABS: 50.0000 mg | ORAL_TABLET | Freq: Two times a day (BID) | ORAL | 0 refills | Status: DC | PRN

## 2017-02-16 MED ORDER — MELOXICAM 7.5 MG PO TABS: 7.5000 mg | ORAL_TABLET | Freq: Every day | ORAL | 3 refills | Status: DC

## 2017-02-16 MED ORDER — LISINOPRIL 5 MG PO TABS: 5.0000 mg | ORAL_TABLET | Freq: Every day | ORAL | 3 refills | Status: DC

## 2017-02-16 MED ORDER — GABAPENTIN 300 MG PO CAPS: 300.0000 mg | ORAL_CAPSULE | Freq: Two times a day (BID) | ORAL | 3 refills | Status: DC

## 2017-02-16 MED FILL — ALLOPURINOL 300 MG TABS: 300 | 30 days supply | Qty: 60 | Fill #0

## 2017-02-16 MED FILL — ATORVASTATIN 20 MG TABLET: 20 | 30 days supply | Qty: 30 | Fill #0

## 2017-02-16 MED FILL — GABAPENTIN 300 MG CAPSULE: 300 | 30 days supply | Qty: 60 | Fill #0

## 2017-02-16 MED FILL — traMADol HCL 50 MG TABS: 50 | 7 days supply | Qty: 14 | Fill #0

## 2017-02-16 MED FILL — CYCLOBENZAPRINE 10 MG TAB: 10 | 30 days supply | Qty: 60 | Fill #0

## 2017-02-16 MED FILL — MELOXICAM 7.5 MG TABLET: 7.5 | 30 days supply | Qty: 30 | Fill #0

## 2017-02-16 MED FILL — LISINOPRIL 5 MG TAB: 5 | 30 days supply | Qty: 30 | Fill #0

## 2017-02-16 NOTE — Progress Notes (Signed)
Subjective:  Patient ID: Terry Poole, male    DOB: May 14, 1965  Age: 52 y.o. MRN: [id number]  CC: Back Pain and Diabetes   HPI Terry Poole  is a 52 year old male with a history of type 2 diabetes mellitus (A1c 6.3, diet controlled) Hyperlipidemia, Gout who presents today with complains of right sided low back pain radiating anteriorly to his right side which caused him to leave work early this morning. Pain is 10/10 and was not preceded by trauma or heavy lifting. He has been working several back to back shifts both at [employer] and is requesting a note to be out of work for two days. He has also had left hip pain which is intermittent and he points to the central aspect of his buttock as the source of pain and has no pain medications at this time. Denies the presence of numbness in his extremities.  His Diabetes is diet controlled. He denies Gout flares and has been compliant with his medications. His blood pressure is elevated but he attributes this to his pain.  Past Medical History:  Diagnosis Date  . Bronchitis   . Bronchitis   . Gout   . Hammertoe of second toe of right foot   . Smoker     Past Surgical History:  Procedure Laterality Date  . HAMMER TOE SURGERY Right 03/10/2016   Procedure: 2ND RIGHT HAMMER TOE CORRECTION;  Surgeon: Landis Martins, DPM;  Location: [place];  Service: Podiatry;  Laterality: Right;  . MASS EXCISION Right 03/10/2016   Procedure: EXCISION OF CALLUS 2ND RIGHT TOE;  Surgeon: Landis Martins, DPM;  Location: [place];  Service: Podiatry;  Laterality: Right;  . NO PAST SURGERIES      Allergies  Allergen Reactions  . Tramadol Hives  . Vicodin [Hydrocodone-Acetaminophen] Hives     Outpatient Medications Prior to Visit  Medication Sig Dispense Refill  . ciclopirox (PENLAC) 8 % solution Apply topically at bedtime. Apply over nail and surrounding skin. Apply daily after filing nail 6.6 mL 2  . colchicine  0.6 MG tablet Take 1 tablet (0.6 mg total) by mouth every 8 (eight) hours as needed. 20 tablet 0  . sildenafil (VIAGRA) 50 MG tablet Take 1 tablet (50 mg total) by mouth daily as needed for erectile dysfunction. At least 24 hours between doses. 30 tablet 0  . terbinafine (LAMISIL) 250 MG tablet Take 1 tablet (250 mg total) by mouth daily. 90 tablet 0  . acetaminophen-codeine (TYLENOL #3) 300-30 MG tablet Take 1-2 tablets every 6 (six) hours as needed by mouth for moderate pain. 15 tablet 0  . allopurinol (ZYLOPRIM) 300 MG tablet Take 1 tablet (300 mg total) by mouth 2 (two) times daily. 60 tablet 3  . atorvastatin (LIPITOR) 20 MG tablet Take 1 tablet (20 mg total) by mouth daily. 30 tablet 3  . cyclobenzaprine (FLEXERIL) 10 MG tablet Take 1 tablet (10 mg total) by mouth 2 (two) times daily as needed for muscle spasms. 60 tablet 1  . gabapentin (NEURONTIN) 300 MG capsule Take 1 capsule (300 mg total) by mouth 2 (two) times daily. 60 capsule 3  . lisinopril (PRINIVIL,ZESTRIL) 5 MG tablet Take 1 tablet (5 mg total) by mouth daily. 90 tablet 3  . meloxicam (MOBIC) 7.5 MG tablet Take 1 tablet (7.5 mg total) by mouth daily. 30 tablet 1  . naproxen (NAPROSYN) 500 MG tablet Take 1 tablet (500 mg total) 2 (two) times daily by mouth. [place]  tablet 0  . traMADol (ULTRAM) 50 MG tablet Take 1 tablet (50 mg total) by mouth every 12 (twelve) hours as needed. 40 tablet 0  . ibuprofen (ADVIL,MOTRIN) 800 MG tablet Take 1 tablet (800 mg total) by mouth every 8 (eight) hours as needed. (Patient not taking: Reported on 02/16/2017) 12 tablet 0   Facility-Administered Medications Prior to Visit  Medication Dose Route Frequency Provider Last Rate Last Dose  . dexamethasone (DECADRON) injection 4 mg  4 mg Intra-articular Once Cannon Kettle, [place], DPM      . triamcinolone acetonide (KENALOG) 10 MG/ML injection 10 mg  10 mg Other Once [place], [place]itory[place], DPM      . triamcinolone acetonide (KENALOG) 10 MG/ML injection 10 mg  10 mg Other  Once Landis Martins, DPM        ROS Review of Systems  Constitutional: Negative for activity change and appetite change.  HENT: Negative for sinus pressure and sore throat.   Eyes: Negative for visual disturbance.  Respiratory: Negative for cough, chest tightness and shortness of breath.   Cardiovascular: Negative for chest pain and leg swelling.  Gastrointestinal: Negative for abdominal distention, abdominal pain, constipation and diarrhea.  Endocrine: Negative.   Genitourinary: Negative for dysuria.  Musculoskeletal: Positive for back pain. Negative for joint swelling and myalgias.  Skin: Negative for rash.  Allergic/Immunologic: Negative.   Neurological: Negative for weakness, light-headedness and numbness.  Psychiatric/Behavioral: Negative for dysphoric mood and suicidal ideas.    Objective:  BP (!) 154/82   Pulse 84   Temp 98.1 F (36.7 C) (Oral)   Ht 5' 9"  (1.753 m)   Wt 216 lb 9.6 oz (98.2 kg)   SpO2 97%   BMI 31.99 kg/m   BP/Weight 02/16/2017 12/15/2016 70/02/7492  Systolic BP 496 759 163  Diastolic BP 82 846 74  Wt. (Lbs) 216.6 - 214  BMI 31.99 - 31.6      Physical Exam  Constitutional: He is oriented to person, place, and time. He appears well-developed and well-nourished.  Cardiovascular: Normal rate, normal heart sounds and intact distal pulses.  No murmur heard. Pulmonary/Chest: Effort normal and breath sounds normal. He has no wheezes. He has no rales. He exhibits no tenderness.  Abdominal: Soft. Bowel sounds are normal. He exhibits no distension and no mass. There is no tenderness.  Musculoskeletal: He exhibits tenderness (TTP of posterior aspect of left hip but no tenderness on ROM).  TTP of right side at T12 dermatome but no back tenderness. Negative straight leg raise.  Neurological: He is alert and oriented to person, place, and time.  Psychiatric: He has a normal mood and affect.    CMP Latest Ref Rng & Units 09/15/2016 05/13/2016 09/21/2015    Glucose 65 - 99 mg/dL 87 92 149(H)  BUN 6 - 24 mg/dL 11 7 9   Creatinine 0.76 - 1.27 mg/dL 1.08 0.94 0.94  Sodium 134 - 144 mmol/L 141 141 141  Potassium 3.5 - 5.2 mmol/L 4.4 4.2 3.9  Chloride 96 - 106 mmol/L 104 100 105  CO2 20 - 29 mmol/L 23 25 25   Calcium 8.7 - 10.2 mg/dL 9.1 9.8 9.3  Total Protein 6.0 - 8.5 g/dL 6.9 7.3 7.0  Total Bilirubin 0.0 - 1.2 mg/dL 0.3 0.4 0.4  Alkaline Phos 39 - 117 IU/L 131(H) 102 128(H)  AST 0 - 40 IU/L 27 39 22  ALT 0 - 44 IU/L 44 78(H) 44    Lab Results  Component Value Date   HGBA1C 6.3 02/16/2017  Assessment & Plan:   1. Type 2 diabetes mellitus without complication, without long-term current use of insulin (HCC) Diet controlled with A1c of 6.3 Continue with diet control, lifestyle modification - POCT glucose (manual entry) - POCT glycosylated hemoglobin (Hb A1C)  2. Idiopathic chronic gout without tophus, unspecified site No recent flares - allopurinol (ZYLOPRIM) 300 MG tablet; Take 1 tablet (300 mg total) by mouth 2 (two) times daily.  Dispense: 60 tablet; Refill: 3  3. Pure hypercholesterolemia Controlled Low cholesterol diet - atorvastatin (LIPITOR) 20 MG tablet; Take 1 tablet (20 mg total) by mouth daily.  Dispense: 30 tablet; Refill: 3  4. Left hip pain Likely underlying osteoarthritis exacerbated by current stressful working conditions - gabapentin (NEURONTIN) 300 MG capsule; Take 1 capsule (300 mg total) by mouth 2 (two) times daily.  Dispense: 60 capsule; Refill: 3 - meloxicam (MOBIC) 7.5 MG tablet; Take 1 tablet (7.5 mg total) by mouth daily.  Dispense: 30 tablet; Refill: 3 - traMADol (ULTRAM) 50 MG tablet; Take 1 tablet (50 mg total) by mouth every 12 (twelve) hours as needed.  Dispense: 40 tablet; Refill: 0  5. Essential hypertension Elevated blood pressure Will attribute to pain and will make no regimen changes today - lisinopril (PRINIVIL,ZESTRIL) 5 MG tablet; Take 1 tablet (5 mg total) by mouth daily.  Dispense: 30  tablet; Refill: 3  6. Muscle spasm He has been advised to apply a heating pad Prolonged standing at his job in addition to working several shifts could have taken a toll on him. Provided a work note for him. - cyclobenzaprine (FLEXERIL) 10 MG tablet; Take 1 tablet (10 mg total) by mouth 2 (two) times daily as needed for muscle spasms.  Dispense: 60 tablet; Refill: 3   Meds ordered this encounter  Medications  . allopurinol (ZYLOPRIM) 300 MG tablet    Sig: Take 1 tablet (300 mg total) by mouth 2 (two) times daily.    Dispense:  60 tablet    Refill:  3    Discontinue previous dose  . atorvastatin (LIPITOR) 20 MG tablet    Sig: Take 1 tablet (20 mg total) by mouth daily.    Dispense:  30 tablet    Refill:  3  . gabapentin (NEURONTIN) 300 MG capsule    Sig: Take 1 capsule (300 mg total) by mouth 2 (two) times daily.    Dispense:  60 capsule    Refill:  3  . lisinopril (PRINIVIL,ZESTRIL) 5 MG tablet    Sig: Take 1 tablet (5 mg total) by mouth daily.    Dispense:  30 tablet    Refill:  3  . meloxicam (MOBIC) 7.5 MG tablet    Sig: Take 1 tablet (7.5 mg total) by mouth daily.    Dispense:  30 tablet    Refill:  3  . cyclobenzaprine (FLEXERIL) 10 MG tablet    Sig: Take 1 tablet (10 mg total) by mouth 2 (two) times daily as needed for muscle spasms.    Dispense:  60 tablet    Refill:  3  . traMADol (ULTRAM) 50 MG tablet    Sig: Take 1 tablet (50 mg total) by mouth every 12 (twelve) hours as needed.    Dispense:  40 tablet    Refill:  0    Follow-up: Return in about 3 months (around 05/17/2017) for follow Up of chronic medical conditions.   Arnoldo Morale MD

## 2017-02-16 NOTE — Patient Instructions (Signed)

## 2017-02-16 NOTE — Progress Notes (Signed)
Pt has been out of some medications.

## 2017-02-17 ENCOUNTER — Encounter: Payer: Self-pay | Admitting: Sports Medicine

## 2017-02-17 ENCOUNTER — Encounter: Payer: Self-pay | Admitting: Family Medicine

## 2017-02-17 ENCOUNTER — Ambulatory Visit: Payer: BLUE CROSS/BLUE SHIELD | Admitting: Sports Medicine

## 2017-02-17 DIAGNOSIS — M2041 Other hammer toe(s) (acquired), right foot: Secondary | ICD-10-CM

## 2017-02-17 DIAGNOSIS — Z9889 Other specified postprocedural states: Secondary | ICD-10-CM | POA: Diagnosis not present

## 2017-02-17 DIAGNOSIS — M79674 Pain in right toe(s): Secondary | ICD-10-CM

## 2017-02-17 DIAGNOSIS — M79675 Pain in left toe(s): Secondary | ICD-10-CM | POA: Diagnosis not present

## 2017-02-17 DIAGNOSIS — E1142 Type 2 diabetes mellitus with diabetic polyneuropathy: Secondary | ICD-10-CM | POA: Diagnosis not present

## 2017-02-17 DIAGNOSIS — Z79899 Other long term (current) drug therapy: Secondary | ICD-10-CM | POA: Diagnosis not present

## 2017-02-17 LAB — HEPATIC FUNCTION PANEL
AG RATIO: 1.4 (calc) (ref 1.0–2.5)
ALKALINE PHOSPHATASE (APISO): 100 U/L (ref 40–115)
ALT: 26 U/L (ref 9–46)
AST: 20 U/L (ref 10–35)
Albumin: 4.2 g/dL (ref 3.6–5.1)
BILIRUBIN INDIRECT: 0.2 mg/dL (ref 0.2–1.2)
Bilirubin, Direct: 0.1 mg/dL (ref 0.0–0.2)
Globulin: 3 g/dL (calc) (ref 1.9–3.7)
TOTAL PROTEIN: 7.2 g/dL (ref 6.1–8.1)
Total Bilirubin: 0.3 mg/dL (ref 0.2–1.2)

## 2017-02-17 MED ORDER — ACETAMINOPHEN-CODEINE #3 300-30 MG PO TABS: 1.0000 | ORAL_TABLET | Freq: Three times a day (TID) | ORAL | 0 refills | Status: DC | PRN

## 2017-02-17 NOTE — Progress Notes (Signed)
Subjective: Terry Poole is a 52 y.o. male patient seen today in office for evaluation of nail fungus and for pain in both feet. Reports pain on at tops and bottoms especially worse with work. Reports that he took 10 motrin with no relief. Reports that he changes shoes that help a little but overall feet hurt really bad. Denies any acute injury. Denies nausea/vomitting/fever/chills/night sweats/abdominal pain. Patient states that he also wants refill on lamisil. Feel like it helped but not the big toe nails as much. No other issues noted.   Patient Active Problem List   Diagnosis Date Noted  . Hypertension 11/19/2016  . Type 2 diabetes mellitus ([place]) 08/19/2016  . Hyperlipidemia 08/19/2016  . Back pain 05/13/2016  . Hammertoe of second toe of right foot 03/06/2016  . Onychomycosis 04/16/2015  . Pseudofolliculitis barbae 42/35/3614  . Gout attack 03/28/2015    Current Outpatient Medications on File Prior to Visit  Medication Sig Dispense Refill  . allopurinol (ZYLOPRIM) 300 MG tablet Take 1 tablet (300 mg total) by mouth 2 (two) times daily. 60 tablet 3  . atorvastatin (LIPITOR) 20 MG tablet Take 1 tablet (20 mg total) by mouth daily. 30 tablet 3  . ciclopirox (PENLAC) 8 % solution Apply topically at bedtime. Apply over nail and surrounding skin. Apply daily after filing nail 6.6 mL 2  . colchicine 0.6 MG tablet Take 1 tablet (0.6 mg total) by mouth every 8 (eight) hours as needed. 20 tablet 0  . cyclobenzaprine (FLEXERIL) 10 MG tablet Take 1 tablet (10 mg total) by mouth 2 (two) times daily as needed for muscle spasms. 60 tablet 3  . gabapentin (NEURONTIN) 300 MG capsule Take 1 capsule (300 mg total) by mouth 2 (two) times daily. 60 capsule 3  . lisinopril (PRINIVIL,ZESTRIL) 5 MG tablet Take 1 tablet (5 mg total) by mouth daily. 30 tablet 3  . meloxicam (MOBIC) 7.5 MG tablet Take 1 tablet (7.5 mg total) by mouth daily. 30 tablet 3  . sildenafil (VIAGRA) 50 MG tablet Take 1 tablet (50 mg  total) by mouth daily as needed for erectile dysfunction. At least 24 hours between doses. 30 tablet 0  . terbinafine (LAMISIL) 250 MG tablet Take 1 tablet (250 mg total) by mouth daily. 90 tablet 0  . traMADol (ULTRAM) 50 MG tablet Take 1 tablet (50 mg total) by mouth every 12 (twelve) hours as needed. 40 tablet 0   Current Facility-Administered Medications on File Prior to Visit  Medication Dose Route Frequency Provider Last Rate Last Dose  . dexamethasone (DECADRON) injection 4 mg  4 mg Intra-articular Once Cannon Kettle, Dinisha Cai, DPM      . triamcinolone acetonide (KENALOG) 10 MG/ML injection 10 mg  10 mg Other Once [place], Jakwon Gayton, DPM      . triamcinolone acetonide (KENALOG) 10 MG/ML injection 10 mg  10 mg Other Once Landis Martins, DPM        Allergies  Allergen Reactions  . Tramadol Hives  . Vicodin [Hydrocodone-Acetaminophen] Hives    Objective: There were no vitals filed for this visit.  General: No acute distress, AAOx3   Righ foot: Incision well healed at surgical site at 2nd toe s/p hammertoe repair,Capillary fill time <3 seconds in all digits, gross sensation present via light touch to right foot. No pain or crepitation with range of motion right foot however there is pain diffusely across the foot. No pain with calf compression.    Nails x 10 thickened and resembling mycosis.   Left foot:+  pain with palpation diffusely across the foot. No other acute symptoms. Pes planus foot type bilateral.   Assessment and Plan:  Problem List Items Addressed This Visit    None    Visit Diagnoses    Diabetic polyneuropathy associated with type 2 diabetes mellitus ([place])    -  Primary   Toe pain, bilateral       Relevant Medications   acetaminophen-codeine (TYLENOL #3) 300-30 MG tablet   Encounter for long-term current use of high risk medication       Relevant Orders   Hepatic Function Panel   S/P foot surgery, right       Hammer toe of right foot          -Patient seen and  evaluated  -Rx Tylenol #3 a few tabs and advised rest, ice, elevation, and rest at work -Reordered a new set of LFTs since Alk phos is elevated; will consider restarting PO lamisil if normal  -Continue with Penlac to use meanwhile -Recommend good hygiene habits  -Recommend diabetic shoes and insoles [employer] diabetic shoe order form was completed; office to contact primary care for approval / certification;  Office to arrange shoe fitting and dispensing. -Return as needed or sooner if problems or issues arise.  Landis Martins, DPM

## 2017-02-18 ENCOUNTER — Telehealth: Payer: Self-pay | Admitting: *Deleted

## 2017-02-18 MED ORDER — TERBINAFINE HCL 250 MG PO TABS: 250.0000 mg | ORAL_TABLET | Freq: Every day | ORAL | 0 refills | Status: DC

## 2017-02-18 NOTE — Telephone Encounter (Signed)
-----   Message from Landis Martins, [place] sent at 02/17/2017  8:01 PM EST ----- Blood work normal, Refill Lamisil 250mg  PO daily 30 tabs will consider more depending how he is doing next visit Thanks Dr. Cannon Kettle

## 2017-02-18 NOTE — Telephone Encounter (Signed)
Unable to leave a message voicemail box is not set up yet.  I informed pt of Dr. Leeanne Rio review of results and orders. Pt states understanding and requested the rx be sent to the [facility].

## 2017-02-19 ENCOUNTER — Ambulatory Visit: Payer: Self-pay | Admitting: Family Medicine

## 2017-02-25 ENCOUNTER — Telehealth: Payer: Self-pay | Admitting: Sports Medicine

## 2017-02-25 NOTE — Telephone Encounter (Signed)
Called pt back in regards to his message requesting his medical records. I told the pt he would need to fill out and sign a release form authorizing [place] to release those records to him. I asked the pt if he just wanted his notes or notes and CD of x-rays. Pt stated he wanted everything we can gather on him. I told him there would be a $5.00 fee for the CD of x-rays. Pt stated if he was unable to come today to sign the form he would call me tomorrow morning around the same time he did this morning to let me know when he would be coming.

## 2017-02-25 NOTE — Telephone Encounter (Signed)
I was calling to see about getting my medical records. My phone number is [phone]. Thank you. Bye.

## 2017-02-28 ENCOUNTER — Emergency Department (HOSPITAL_COMMUNITY)
Admission: EM | Admit: 2017-02-28 | Discharge: 2017-02-28 | Disposition: A | Discharge status: Home / Self Care | Payer: BLUE CROSS/BLUE SHIELD | Attending: Emergency Medicine | Admitting: Emergency Medicine

## 2017-02-28 ENCOUNTER — Encounter (HOSPITAL_COMMUNITY): Payer: Self-pay | Admitting: Emergency Medicine

## 2017-02-28 ENCOUNTER — Other Ambulatory Visit: Payer: Self-pay

## 2017-02-28 DIAGNOSIS — F17210 Nicotine dependence, cigarettes, uncomplicated: Secondary | ICD-10-CM | POA: Insufficient documentation

## 2017-02-28 DIAGNOSIS — M109 Gout, unspecified: Secondary | ICD-10-CM

## 2017-02-28 DIAGNOSIS — E119 Type 2 diabetes mellitus without complications: Secondary | ICD-10-CM | POA: Diagnosis not present

## 2017-02-28 DIAGNOSIS — M79671 Pain in right foot: Secondary | ICD-10-CM | POA: Diagnosis present

## 2017-02-28 DIAGNOSIS — I10 Essential (primary) hypertension: Secondary | ICD-10-CM | POA: Insufficient documentation

## 2017-02-28 DIAGNOSIS — Z79899 Other long term (current) drug therapy: Secondary | ICD-10-CM | POA: Diagnosis not present

## 2017-02-28 MED ORDER — METHYLPREDNISOLONE SODIUM SUCC 125 MG IJ SOLR
125.0000 mg | Freq: Once | INTRAMUSCULAR | Status: AC
Start: 2017-02-28 — End: 2017-02-28
  Administered 2017-02-28: 125 mg via INTRAMUSCULAR
  Filled 2017-02-28: qty 2

## 2017-02-28 MED ORDER — KETOROLAC TROMETHAMINE 60 MG/2ML IM SOLN
60.0000 mg | Freq: Once | INTRAMUSCULAR | Status: AC
  Administered 2017-02-28: 60 mg via INTRAMUSCULAR
  Filled 2017-02-28: qty 2

## 2017-02-28 NOTE — Discharge Instructions (Signed)
See your Physician for recheck next week  °

## 2017-02-28 NOTE — ED Triage Notes (Signed)
Pt. Stated, Terry Poole got gout in both feet. Started 2 months ago off and on

## 2017-02-28 NOTE — ED Provider Notes (Signed)
[facility] EMERGENCY DEPARTMENT Provider Note   CSN: [id number] Arrival date & time: 02/28/17  1011     History   Chief Complaint Chief Complaint  Patient presents with  . Foot Pain  . Gout    HPI Terry Poole is a 52 y.o. male.  The history is provided by the patient. No language interpreter was used.  Foot Pain  This is a new problem. The current episode started 2 days ago. The problem occurs constantly. The problem has not changed since onset.Nothing aggravates the symptoms. Nothing relieves the symptoms. He has tried nothing for the symptoms. The treatment provided no relief.  Pt complains of gout in both feet.  Pt reports no relief with gout medications.   Past Medical History:  Diagnosis Date  . Bronchitis   . Bronchitis   . Gout   . Hammertoe of second toe of right foot   . Smoker     Patient Active Problem List   Diagnosis Date Noted  . Hypertension 11/19/2016  . Type 2 diabetes mellitus ([place]) 08/19/2016  . Hyperlipidemia 08/19/2016  . Back pain 05/13/2016  . Hammertoe of second toe of right foot 03/06/2016  . Onychomycosis 04/16/2015  . Pseudofolliculitis barbae 47/42/5956  . Gout attack 03/28/2015    Past Surgical History:  Procedure Laterality Date  . HAMMER TOE SURGERY Right 03/10/2016   Procedure: 2ND RIGHT HAMMER TOE CORRECTION;  Surgeon: Landis Martins, DPM;  Location: [place];  Service: Podiatry;  Laterality: Right;  . MASS EXCISION Right 03/10/2016   Procedure: EXCISION OF CALLUS 2ND RIGHT TOE;  Surgeon: Landis Martins, DPM;  Location: [place];  Service: Podiatry;  Laterality: Right;  . NO PAST SURGERIES         Home Medications    Prior to Admission medications   Medication Sig Start Date End Date Taking? Authorizing Provider  acetaminophen-codeine (TYLENOL #3) 300-30 MG tablet Take 1 tablet by mouth every 8 (eight) hours as needed for moderate pain. 02/17/17   Landis Martins, DPM    allopurinol (ZYLOPRIM) 300 MG tablet Take 1 tablet (300 mg total) by mouth 2 (two) times daily. 02/16/17   [name], MD  atorvastatin (LIPITOR) 20 MG tablet Take 1 tablet (20 mg total) by mouth daily. 02/16/17   [name], MD  ciclopirox (PENLAC) 8 % solution Apply topically at bedtime. Apply over nail and surrounding skin. Apply daily after filing nail 10/07/16   Landis Martins, DPM  colchicine 0.6 MG tablet Take 1 tablet (0.6 mg total) by mouth every 8 (eight) hours as needed. 11/09/16   Jola Schmidt, MD  cyclobenzaprine (FLEXERIL) 10 MG tablet Take 1 tablet (10 mg total) by mouth 2 (two) times daily as needed for muscle spasms. 02/16/17   [name], MD  gabapentin (NEURONTIN) 300 MG capsule Take 1 capsule (300 mg total) by mouth 2 (two) times daily. 02/16/17   [name], MD  lisinopril (PRINIVIL,ZESTRIL) 5 MG tablet Take 1 tablet (5 mg total) by mouth daily. 02/16/17   [name], MD  meloxicam (MOBIC) 7.5 MG tablet Take 1 tablet (7.5 mg total) by mouth daily. 02/16/17   [name], MD  sildenafil (VIAGRA) 50 MG tablet Take 1 tablet (50 mg total) by mouth daily as needed for erectile dysfunction. At least 24 hours between doses. 05/30/16   [name], MD  terbinafine (LAMISIL) 250 MG tablet Take 1 tablet (250 mg total) by mouth daily. 05/13/16   Landis Martins, DPM  terbinafine (LAMISIL) 250 MG tablet Take 1 tablet (250 mg total) by mouth daily. 02/18/17   Landis Martins, DPM  traMADol (ULTRAM) 50 MG tablet Take 1 tablet (50 mg total) by mouth every 12 (twelve) hours as needed. 02/16/17   [name], MD    Family History No family history on file.  Social History Social History   Tobacco Use  . Smoking status: Current Every Day Smoker    Packs/day: 0.25    Types: Cigarettes  . Smokeless tobacco: Never Used  . Tobacco comment: 8 cigs daily  Substance Use Topics  . Alcohol use: No  . Drug use: No     Allergies   Tramadol and Vicodin  [hydrocodone-acetaminophen]   Review of Systems Review of Systems  All other systems reviewed and are negative.    Physical Exam Updated Vital Signs BP (!) 143/99 (BP Location: Right Arm)   Pulse 98   Temp 98.1 F (36.7 C) (Oral)   Resp 17   Ht 5\' 9"  (1.753 m)   Wt 98 kg (216 lb)   SpO2 95%   BMI 31.90 kg/m   Physical Exam  Constitutional: He appears well-developed and well-nourished.  Musculoskeletal: He exhibits tenderness. He exhibits no deformity.  Swollen bilat ankles and feet. nv and ns intact  Neurological: He is alert.  Skin: Skin is warm.  Psychiatric: He has a normal mood and affect.  Nursing note and vitals reviewed.    ED Treatments / Results  Labs (all labs ordered are listed, but only abnormal results are displayed) Labs Reviewed - No data to display  EKG  EKG Interpretation None       Radiology No results found.  Procedures Procedures (including critical care time)  Medications Ordered in ED Medications  methylPREDNISolone sodium succinate (SOLU-MEDROL) 125 mg/2 mL injection 125 mg (125 mg Intramuscular Given 02/28/17 1133)  ketorolac (TORADOL) injection 60 mg (60 mg Intramuscular Given 02/28/17 1133)     Initial Impression / Assessment and Plan / ED Course  I have reviewed the triage vital signs and the nursing notes.  Pertinent labs & imaging results that were available during my care of the patient were reviewed by me and considered in my medical decision making (see chart for details).    Pt has chronic gout with acute flare up.  Pt given solumedrol and torodol.   Pt advised to continue colchicine.  He is advised to see his Md for recheck in 2-3 days.  Pt is request leave from his job.  Pt given note to be out 3 days.  Pt advised he needs to talk to his Md about being out longer  Final Clinical Impressions(s) / ED Diagnoses   Final diagnoses:  Acute gout of right foot, unspecified cause    ED Discharge Orders    None    An  After Visit Summary was printed and given to the patient.    Fransico Meadow, Hershal Coria 02/28/17 1623    Carmin Muskrat, MD 03/01/17 [phone]

## 2017-03-02 ENCOUNTER — Telehealth: Payer: Self-pay | Admitting: Sports Medicine

## 2017-03-02 ENCOUNTER — Telehealth: Payer: Self-pay | Admitting: Family Medicine

## 2017-03-02 DIAGNOSIS — M79676 Pain in unspecified toe(s): Secondary | ICD-10-CM

## 2017-03-02 MED FILL — TERBINAFINE HCL 250 MG TAB: 250 | 30 days supply | Qty: 30 | Fill #0

## 2017-03-02 NOTE — Telephone Encounter (Signed)
Yes, this is Terry Poole and my birthday is 31-Mar-1965. I had called last week about picking up my medical records. Uh we will be there this morning and my number is [phone]. Again that number is [phone]. Thank you. Bye.

## 2017-03-02 NOTE — Telephone Encounter (Signed)
Patients stopped by the office to ask if patients gabapentin could be switched to liquid rather then pills, patient has trouble swallowing pills.  [place]

## 2017-03-03 MED ORDER — GABAPENTIN 300 MG/6ML PO SOLN: 300.0000 mg | Freq: Two times a day (BID) | ORAL | 3 refills | Status: DC

## 2017-03-03 NOTE — Addendum Note (Signed)
Addended byCharlott Rakes on: 03/03/2017 05:00 PM   Modules accepted: Orders

## 2017-03-03 NOTE — Telephone Encounter (Signed)
Done

## 2017-03-04 ENCOUNTER — Other Ambulatory Visit: Payer: Self-pay | Admitting: Family Medicine

## 2017-03-04 MED ORDER — GABAPENTIN 250 MG/5ML PO SOLN: 250.0000 mg | Freq: Three times a day (TID) | ORAL | 3 refills | Status: DC

## 2017-03-04 MED ORDER — GABAPENTIN 250 MG/5ML PO SOLN: 250.0000 mg | Freq: Two times a day (BID) | ORAL | 3 refills | Status: DC

## 2017-03-10 ENCOUNTER — Telehealth: Payer: Self-pay | Admitting: Sports Medicine

## 2017-03-10 NOTE — Telephone Encounter (Signed)
Pt called and was checking on diabetic shoe status...  I returned call and he is scheduled for 2.12.19 to puds. I also notified pt that his insurance [employer] does not cover diabetic shoes/inserts and they would be 250.00 for 1 pr shoes and normally it is 1 pair of inserts but since we made the mistake of ordering 3 pr he will receive 3 pair included in the cost.

## 2017-03-17 ENCOUNTER — Other Ambulatory Visit: Payer: BLUE CROSS/BLUE SHIELD

## 2017-03-23 ENCOUNTER — Telehealth: Payer: Self-pay | Admitting: *Deleted

## 2017-03-23 ENCOUNTER — Encounter (HOSPITAL_COMMUNITY): Payer: Self-pay | Admitting: Emergency Medicine

## 2017-03-23 ENCOUNTER — Ambulatory Visit (HOSPITAL_COMMUNITY)
Admission: EM | Admit: 2017-03-23 | Discharge: 2017-03-23 | Disposition: A | Discharge status: Home / Self Care | Payer: BLUE CROSS/BLUE SHIELD | Attending: Family Medicine | Admitting: Family Medicine

## 2017-03-23 DIAGNOSIS — M10072 Idiopathic gout, left ankle and foot: Secondary | ICD-10-CM

## 2017-03-23 DIAGNOSIS — M79672 Pain in left foot: Secondary | ICD-10-CM | POA: Diagnosis not present

## 2017-03-23 DIAGNOSIS — M1A0720 Idiopathic chronic gout, left ankle and foot, without tophus (tophi): Secondary | ICD-10-CM

## 2017-03-23 DIAGNOSIS — M79671 Pain in right foot: Secondary | ICD-10-CM

## 2017-03-23 MED ORDER — METHYLPREDNISOLONE SODIUM SUCC 125 MG IJ SOLR
INTRAMUSCULAR | Status: AC
  Filled 2017-03-23: qty 2

## 2017-03-23 MED ORDER — ACETAMINOPHEN-CODEINE #3 300-30 MG PO TABS: 1.0000 | ORAL_TABLET | Freq: Three times a day (TID) | ORAL | 0 refills | Status: DC | PRN

## 2017-03-23 MED ORDER — METHYLPREDNISOLONE SODIUM SUCC 125 MG IJ SOLR
125.0000 mg | Freq: Once | INTRAMUSCULAR | Status: AC
  Administered 2017-03-23: 125 mg via INTRAMUSCULAR

## 2017-03-23 NOTE — Discharge Instructions (Signed)
Ice, elevate feet to help with pain.  Continue with previously prescribed colchicine, allopurinol and meloxicam. See low-purine diet as attached. Continue to follow with your primary care provider for further management and evaluation

## 2017-03-23 NOTE — Telephone Encounter (Signed)
Pt called states he is stopping by the office in 3 minutes, so he can get a prescription for Tylenol #3, so he can go back to work.

## 2017-03-23 NOTE — Telephone Encounter (Signed)
Pt called, request prescription for Tylenol #3, complains of left foot pain so bad it is keeping him out of work.

## 2017-03-23 NOTE — Telephone Encounter (Signed)
Pt presents to office requesting prescription for Tylenol #3.

## 2017-03-23 NOTE — ED Provider Notes (Signed)
[place]    CSN: [id number] Arrival date & time: 03/23/17  1039     History   Chief Complaint Chief Complaint  Patient presents with  . Gout    HPI Terry Poole is a 52 y.o. male.   Terry Poole presents with complaints of acute on chronic foot and ankle pain. States has been suffering from gout for years which has been worsening over the past year with more frequent flairs. Was in the ER 1/26 due to similar pain, and has been seen approximately once a month for this recently. Left ankle is painful as well as now also right foot. He states he works two jobs and is on his feet the entire time. Takes allopurinol, Colchicine, meloxicam, gabapentin and tylenol #3 for pain. Pain is 8-10/10. Worse with weight bearing. At times becomes red. Swelling to left ankle present. No injury. Has had surgery to his right second toe. History of htn, dm. Has an appointment with his PCP tomorrow.     ROS per HPI.       Past Medical History:  Diagnosis Date  . Bronchitis   . Bronchitis   . Gout   . Hammertoe of second toe of right foot   . Smoker     Patient Active Problem List   Diagnosis Date Noted  . Hypertension 11/19/2016  . Type 2 diabetes mellitus ([place]) 08/19/2016  . Hyperlipidemia 08/19/2016  . Back pain 05/13/2016  . Hammertoe of second toe of right foot 03/06/2016  . Onychomycosis 04/16/2015  . 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 Gout attack 03/28/2015    Past Surgical History:  Procedure Laterality Date  . HAMMER TOE SURGERY Right 03/10/2016   Procedure: 2ND RIGHT HAMMER TOE CORRECTION;  Surgeon: Landis Martins, DPM;  Location: [facility];  Service: Podiatry;  Laterality: Right;  . MASS EXCISION Right 03/10/2016   Procedure: EXCISION OF CALLUS 2ND RIGHT TOE;  Surgeon: Landis Martins, DPM;  Location: [facility];  Service: Podiatry;  Laterality: Right;  . NO PAST SURGERIES         Home Medications    Prior to Admission  medications   Medication Sig Start Date End Date Taking? Authorizing Provider  acetaminophen-codeine (TYLENOL #3) 300-30 MG tablet Take 1 tablet by mouth every 8 (eight) hours as needed for moderate pain. 02/17/17  Yes Stover, Titorya, DPM  allopurinol (ZYLOPRIM) 300 MG tablet Take 1 tablet (300 mg total) by mouth 2 (two) times daily. 02/16/17  Yes [name], MD  atorvastatin (LIPITOR) 20 MG tablet Take 1 tablet (20 mg total) by mouth daily. 02/16/17  Yes [name], MD  colchicine 0.6 MG tablet Take 1 tablet (0.6 mg total) by mouth every 8 (eight) hours as needed. 11/09/16  Yes Jola Schmidt, MD  gabapentin (NEURONTIN) 250 MG/5ML solution Take 5 mLs (250 mg total) by mouth 2 (two) times daily. 03/04/17  Yes Newlin, Charlane Ferretti, MD  lisinopril (PRINIVIL,ZESTRIL) 5 MG tablet Take 1 tablet (5 mg total) by mouth daily. 02/16/17  Yes [name], MD  meloxicam (MOBIC) 7.5 MG tablet Take 1 tablet (7.5 mg total) by mouth daily. 02/16/17  Yes [name], MD  terbinafine (LAMISIL) 250 MG tablet Take 1 tablet (250 mg total) by mouth daily. 05/13/16  Yes Stover, Titorya, DPM  terbinafine (LAMISIL) 250 MG tablet Take 1 tablet (250 mg total) by mouth daily. 02/18/17  Yes Stover, Titorya, DPM  ciclopirox (PENLAC) 8 % solution Apply topically at bedtime. Apply over nail and surrounding  skin. Apply daily after filing nail 10/07/16   Landis Martins, DPM  cyclobenzaprine (FLEXERIL) 10 MG tablet Take 1 tablet (10 mg total) by mouth 2 (two) times daily as needed for muscle spasms. 02/16/17   [name], MD  sildenafil (VIAGRA) 50 MG tablet Take 1 tablet (50 mg total) by mouth daily as needed for erectile dysfunction. At least 24 hours between doses. 05/30/16   [name], MD  traMADol (ULTRAM) 50 MG tablet Take 1 tablet (50 mg total) by mouth every 12 (twelve) hours as needed. 02/16/17   [name], MD    Family History No family history on file.  Social History Social History   Tobacco Use  .  Smoking status: Current Every Day Smoker    Packs/day: 0.25    Types: Cigarettes  . Smokeless tobacco: Never Used  . Tobacco comment: 8 cigs daily  Substance Use Topics  . Alcohol use: No  . Drug use: No     Allergies   Tramadol and Vicodin [hydrocodone-acetaminophen]   Review of Systems Review of Systems   Physical Exam Triage Vital Signs ED Triage Vitals  Enc Vitals Group     BP 03/23/17 1151 (!) 148/99     Pulse Rate 03/23/17 1151 78     Resp 03/23/17 1151 16     Temp 03/23/17 1151 97.9 F (36.6 C)     Temp Source 03/23/17 1151 Oral     SpO2 03/23/17 1151 100 %     Weight 03/23/17 1202 215 lb (97.5 kg)     Height --      Head Circumference --      Peak Flow --      Pain Score 03/23/17 1202 8     Pain Loc --      Pain Edu? --      Excl. in [place]? --    No data found.  Updated Vital Signs BP (!) 148/99 (BP Location: Right Arm)   Pulse 78   Temp 97.9 F (36.6 C) (Oral)   Resp 16   Wt 215 lb (97.5 kg)   SpO2 100%   BMI 31.75 kg/m   Visual Acuity Right Eye Distance:   Left Eye Distance:   Bilateral Distance:    Right Eye Near:   Left Eye Near:    Bilateral Near:     Physical Exam  Constitutional: He is oriented to person, place, and time. He appears well-developed and well-nourished.  Cardiovascular: Normal rate and regular rhythm.  Pulmonary/Chest: Effort normal and breath sounds normal.  Musculoskeletal:       Right ankle: He exhibits normal range of motion, no swelling, no ecchymosis, no deformity, no laceration and normal pulse. No tenderness. Achilles tendon normal.       Left ankle: He exhibits swelling. He exhibits normal range of motion, no ecchymosis, no deformity, no laceration and normal pulse. Tenderness.       Feet:  Left ankle with swelling and generalized tenderness to medial and lateral ankle; sensation intact to bilateral feet; moving toes; limited ROM to left ankle due to pain and swelling; right foot and ankle without swelling and  without point tenderness; complaints of generalized pain; strong and equal pedal pulses; cap refill <2 seconds; without open wounds or lesions  Neurological: He is alert and oriented to person, place, and time.  Skin: Skin is warm and dry.     UC Treatments / Results  Labs (all labs ordered are listed, but only abnormal results are  displayed) Labs Reviewed - No data to display  EKG  EKG Interpretation None       Radiology No results found.  Procedures Procedures (including critical care time)  Medications Ordered in UC Medications  methylPREDNISolone sodium succinate (SOLU-MEDROL) 125 mg/2 mL injection 125 mg (not administered)     Initial Impression / Assessment and Plan / UC Course  I have reviewed the triage vital signs and the nursing notes.  Pertinent labs & imaging results that were available during my care of the patient were reviewed by me and considered in my medical decision making (see chart for details).     Solumedrol IM today in clinic. Continue with previously prescribed medications. This is an acute on chronic flair that has been becoming more frequent. Encouraged to keep PCP appointment for continued evaluation and dialogue about options for management. Patient verbalized understanding and agreeable to plan.  Ambulatory out of clinic without difficulty.    Final Clinical Impressions(s) / UC Diagnoses   Final diagnoses:  Bilateral foot pain  Chronic gout of left ankle, unspecified cause    ED Discharge Orders    None       Controlled Substance Prescriptions [place] Controlled Substance Registry consulted? Not Applicable   Zigmund Gottron, NP 03/23/17 1237

## 2017-03-23 NOTE — ED Triage Notes (Signed)
PT reports chronic gout issues. PT reports pain in left foot and right elbow pain.   PT reports pain and swelling in both feet as well. PT reports it has been going on for weeks. PT is attempting to be seen by a PCP.

## 2017-03-23 NOTE — Telephone Encounter (Signed)
I spoke with pt, he states his foot is killing him from the gout. I informed him the Tylenol #3 would not take care of the gout in the foot, it may allow him to get some sleep tonight, but he can't work while taking and it won't make the gout better. I offered pt an appt 03/24/2017 at 2:15pm with Dr. Cannon Kettle. Pt accepted and I gave refill rx for Tylenol #3 5 tablets only.

## 2017-03-24 ENCOUNTER — Encounter: Payer: Self-pay | Admitting: Sports Medicine

## 2017-03-24 ENCOUNTER — Ambulatory Visit (INDEPENDENT_AMBULATORY_CARE_PROVIDER_SITE_OTHER): Payer: BLUE CROSS/BLUE SHIELD | Admitting: Orthotics

## 2017-03-24 ENCOUNTER — Ambulatory Visit (INDEPENDENT_AMBULATORY_CARE_PROVIDER_SITE_OTHER): Payer: BLUE CROSS/BLUE SHIELD | Admitting: Sports Medicine

## 2017-03-24 ENCOUNTER — Other Ambulatory Visit: Payer: Self-pay | Admitting: Sports Medicine

## 2017-03-24 ENCOUNTER — Ambulatory Visit (INDEPENDENT_AMBULATORY_CARE_PROVIDER_SITE_OTHER): Payer: BLUE CROSS/BLUE SHIELD

## 2017-03-24 DIAGNOSIS — M25572 Pain in left ankle and joints of left foot: Secondary | ICD-10-CM | POA: Diagnosis not present

## 2017-03-24 DIAGNOSIS — M7752 Other enthesopathy of left foot: Secondary | ICD-10-CM | POA: Diagnosis not present

## 2017-03-24 DIAGNOSIS — M2041 Other hammer toe(s) (acquired), right foot: Secondary | ICD-10-CM

## 2017-03-24 DIAGNOSIS — L84 Corns and callosities: Secondary | ICD-10-CM | POA: Diagnosis not present

## 2017-03-24 DIAGNOSIS — E1142 Type 2 diabetes mellitus with diabetic polyneuropathy: Secondary | ICD-10-CM

## 2017-03-24 DIAGNOSIS — M1000 Idiopathic gout, unspecified site: Secondary | ICD-10-CM

## 2017-03-24 DIAGNOSIS — M722 Plantar fascial fibromatosis: Secondary | ICD-10-CM | POA: Diagnosis not present

## 2017-03-24 MED ORDER — INDOMETHACIN 50 MG PO CAPS: 50.0000 mg | ORAL_CAPSULE | Freq: Two times a day (BID) | ORAL | 0 refills | Status: DC

## 2017-03-24 MED FILL — INDOMETHACIN 50 MG CAPSULE: 50 | 15 days supply | Qty: 30 | Fill #0

## 2017-03-24 NOTE — Patient Instructions (Signed)

## 2017-03-24 NOTE — Progress Notes (Signed)

## 2017-03-24 NOTE — Progress Notes (Signed)
Subjective: Terry Poole is a 52 y.o. male patient seen today in office for evaluation of 10/10 pain in left ankle all across. Went to ER on yesterday where he was given a Steroid injection IM and reports that his gout is flaring up. States that he has a few tablets of Tylenol #3 left and that he hasn't noticed a difference yet with the treatment on yesterday. Patient is also to be fitted for shoes with [facility] today. No other issues noted.   Patient Active Problem List   Diagnosis Date Noted  . Hypertension 11/19/2016  . Type 2 diabetes mellitus ([place]) 08/19/2016  . Hyperlipidemia 08/19/2016  . Back pain 05/13/2016  . Hammertoe of second toe of right foot 03/06/2016  . Onychomycosis 04/16/2015  . Pseudofolliculitis barbae 66/44/0347  . Gout attack 03/28/2015    Current Outpatient Medications on File Prior to Visit  Medication Sig Dispense Refill  . acetaminophen-codeine (TYLENOL #3) 300-30 MG tablet Take 1 tablet by mouth every 8 (eight) hours as needed for moderate pain. 5 tablet 0  . acetaminophen-codeine (TYLENOL #3) 300-30 MG tablet Take 1 tablet by mouth every 8 (eight) hours as needed for moderate pain. 5 tablet 0  . allopurinol (ZYLOPRIM) 300 MG tablet Take 1 tablet (300 mg total) by mouth 2 (two) times daily. 60 tablet 3  . atorvastatin (LIPITOR) 20 MG tablet Take 1 tablet (20 mg total) by mouth daily. 30 tablet 3  . ciclopirox (PENLAC) 8 % solution Apply topically at bedtime. Apply over nail and surrounding skin. Apply daily after filing nail 6.6 mL 2  . colchicine 0.6 MG tablet Take 1 tablet (0.6 mg total) by mouth every 8 (eight) hours as needed. 20 tablet 0  . cyclobenzaprine (FLEXERIL) 10 MG tablet Take 1 tablet (10 mg total) by mouth 2 (two) times daily as needed for muscle spasms. 60 tablet 3  . gabapentin (NEURONTIN) 250 MG/5ML solution Take 5 mLs (250 mg total) by mouth 2 (two) times daily. 300 mL 3  . lisinopril (PRINIVIL,ZESTRIL) 5 MG tablet Take 1 tablet (5 mg total) by  mouth daily. 30 tablet 3  . meloxicam (MOBIC) 7.5 MG tablet Take 1 tablet (7.5 mg total) by mouth daily. 30 tablet 3  . sildenafil (VIAGRA) 50 MG tablet Take 1 tablet (50 mg total) by mouth daily as needed for erectile dysfunction. At least 24 hours between doses. 30 tablet 0  . terbinafine (LAMISIL) 250 MG tablet Take 1 tablet (250 mg total) by mouth daily. 90 tablet 0  . terbinafine (LAMISIL) 250 MG tablet Take 1 tablet (250 mg total) by mouth daily. 30 tablet 0  . traMADol (ULTRAM) 50 MG tablet Take 1 tablet (50 mg total) by mouth every 12 (twelve) hours as needed. 40 tablet 0   Current Facility-Administered Medications on File Prior to Visit  Medication Dose Route Frequency Provider Last Rate Last Dose  . dexamethasone (DECADRON) injection 4 mg  4 mg Intra-articular Once Cannon Kettle, Jere Vanburen, DPM      . triamcinolone acetonide (KENALOG) 10 MG/ML injection 10 mg  10 mg Other Once [place], Suhana Wilner, DPM      . triamcinolone acetonide (KENALOG) 10 MG/ML injection 10 mg  10 mg Other Once Landis Martins, DPM        Allergies  Allergen Reactions  . Tramadol Hives  . Vicodin [Hydrocodone-Acetaminophen] Hives    Objective: There were no vitals filed for this visit.  General: No acute distress, AAOx3   Righ foot: Incision well healed at surgical  site at 2nd toe s/p hammertoe repair, No acute pain on this foot   Nails x 10 thickened and resembling mycosis.   Left foot:+ pain with palpation diffusely across the foot but most intense at medial and lateral ankle with mild soft tissue swelling and warmth. No other acute symptoms.   Pes planus foot type bilateral.   Xray Left ankle- mild soft tissue swelling, early arthritis, no other acute findings.   Assessment and Plan:  Problem List Items Addressed This Visit    None    Visit Diagnoses    Acute left ankle pain    -  Primary   Relevant Medications   indomethacin (INDOCIN) 50 MG capsule   Capsulitis of ankle, left       Relevant  Medications   indomethacin (INDOCIN) 50 MG capsule   Idiopathic gout, unspecified chronicity, unspecified site       Relevant Medications   indomethacin (INDOCIN) 50 MG capsule   Diabetic polyneuropathy associated with type 2 diabetes mellitus (HCC)       Relevant Medications   indomethacin (INDOCIN) 50 MG capsule      -Patient seen and evaluated  -Xrays reviewed  -Rx Inodcin -Recommend follow up with PCP for long term gout management -Dispensed compression sleeve to assist with edema control on left ankle -Recommend rest, ice, elevation. Patient to resume work on Thursday -Patient to meet with [facility] for Diabetic shoes/insoles  -Return as needed or sooner if problems or issues arise.  Landis Martins, DPM

## 2017-03-25 ENCOUNTER — Ambulatory Visit: Payer: BLUE CROSS/BLUE SHIELD | Attending: Family Medicine | Admitting: Family Medicine

## 2017-03-25 ENCOUNTER — Encounter: Payer: Self-pay | Admitting: Family Medicine

## 2017-03-25 VITALS — BP 152/91 | HR 78 | Temp 97.6°F | Ht 69.0 in | Wt 224.0 lb

## 2017-03-25 DIAGNOSIS — M10072 Idiopathic gout, left ankle and foot: Secondary | ICD-10-CM | POA: Insufficient documentation

## 2017-03-25 DIAGNOSIS — M1A0720 Idiopathic chronic gout, left ankle and foot, without tophus (tophi): Secondary | ICD-10-CM | POA: Diagnosis not present

## 2017-03-25 DIAGNOSIS — M109 Gout, unspecified: Secondary | ICD-10-CM | POA: Diagnosis present

## 2017-03-25 DIAGNOSIS — E119 Type 2 diabetes mellitus without complications: Secondary | ICD-10-CM | POA: Diagnosis not present

## 2017-03-25 DIAGNOSIS — E785 Hyperlipidemia, unspecified: Secondary | ICD-10-CM | POA: Insufficient documentation

## 2017-03-25 DIAGNOSIS — Z79899 Other long term (current) drug therapy: Secondary | ICD-10-CM | POA: Diagnosis not present

## 2017-03-25 DIAGNOSIS — M19072 Primary osteoarthritis, left ankle and foot: Secondary | ICD-10-CM | POA: Insufficient documentation

## 2017-03-25 LAB — GLUCOSE, POCT (MANUAL RESULT ENTRY): POC GLUCOSE: 94 mg/dL (ref 70–99)

## 2017-03-25 MED ORDER — COLCHICINE 0.6 MG PO TABS: ORAL_TABLET | ORAL | 2 refills | Status: DC

## 2017-03-25 MED FILL — GABAPENTIN 250MG/5ML SOLUTI: 250 | 47 days supply | Qty: 470 | Fill #0

## 2017-03-25 NOTE — Progress Notes (Signed)
Subjective:  Patient ID: Terry Poole, male    DOB: 03-21-1965  Age: 52 y.o. MRN: [id number]  CC: Gout   HPI Terry Poole  is a 52 year old male with a history of type 2 diabetes mellitus (A1c 6.3, diet controlled) Hyperlipidemia, Gout who presents today complaining of left ankle pain which is worse with prolonged standing and is requesting a letter to assist him resign from one job as he currently works multiple jobs at [employer], [employer] and [employer] was recently let go from my home.  He was seen by [facility] - Dr. Cannon Kettle yesterday and x-ray of the left ankle revealed mild soft tissue swelling, early arthritis, no acute findings. He was given a prescription for indomethacin which he is yet to pick up.  He informs me pain is on the medial and lateral malleolus and sometimes radiates around to the back of his left ankle.  When he takes medication he has relief for about 35-40 minutes only for symptoms to return.  Past Medical History:  Diagnosis Date  . Bronchitis   . Bronchitis   . Gout   . Hammertoe of second toe of right foot   . Smoker     Past Surgical History:  Procedure Laterality Date  . HAMMER TOE SURGERY Right 03/10/2016   Procedure: 2ND RIGHT HAMMER TOE CORRECTION;  Surgeon: Landis Martins, DPM;  Location: [place];  Service: Podiatry;  Laterality: Right;  . MASS EXCISION Right 03/10/2016   Procedure: EXCISION OF CALLUS 2ND RIGHT TOE;  Surgeon: Landis Martins, DPM;  Location: [place];  Service: Podiatry;  Laterality: Right;  . NO PAST SURGERIES      Allergies  Allergen Reactions  . Tramadol Hives  . Vicodin [Hydrocodone-Acetaminophen] Hives     Outpatient Medications Prior to Visit  Medication Sig Dispense Refill  . acetaminophen-codeine (TYLENOL #3) 300-30 MG tablet Take 1 tablet by mouth every 8 (eight) hours as needed for moderate pain. 5 tablet 0  . acetaminophen-codeine (TYLENOL #3) 300-30 MG  tablet Take 1 tablet by mouth every 8 (eight) hours as needed for moderate pain. 5 tablet 0  . allopurinol (ZYLOPRIM) 300 MG tablet Take 1 tablet (300 mg total) by mouth 2 (two) times daily. 60 tablet 3  . atorvastatin (LIPITOR) 20 MG tablet Take 1 tablet (20 mg total) by mouth daily. 30 tablet 3  . ciclopirox (PENLAC) 8 % solution Apply topically at bedtime. Apply over nail and surrounding skin. Apply daily after filing nail 6.6 mL 2  . cyclobenzaprine (FLEXERIL) 10 MG tablet Take 1 tablet (10 mg total) by mouth 2 (two) times daily as needed for muscle spasms. 60 tablet 3  . gabapentin (NEURONTIN) 250 MG/5ML solution Take 5 mLs (250 mg total) by mouth 2 (two) times daily. 300 mL 3  . indomethacin (INDOCIN) 50 MG capsule Take 1 capsule (50 mg total) by mouth 2 (two) times daily with a meal. 30 capsule 0  . lisinopril (PRINIVIL,ZESTRIL) 5 MG tablet Take 1 tablet (5 mg total) by mouth daily. 30 tablet 3  . meloxicam (MOBIC) 7.5 MG tablet Take 1 tablet (7.5 mg total) by mouth daily. 30 tablet 3  . sildenafil (VIAGRA) 50 MG tablet Take 1 tablet (50 mg total) by mouth daily as needed for erectile dysfunction. At least 24 hours between doses. 30 tablet 0  . terbinafine (LAMISIL) 250 MG tablet Take 1 tablet (250 mg total) by mouth daily. 90 tablet 0  .  terbinafine (LAMISIL) 250 MG tablet Take 1 tablet (250 mg total) by mouth daily. 30 tablet 0  . traMADol (ULTRAM) 50 MG tablet Take 1 tablet (50 mg total) by mouth every 12 (twelve) hours as needed. 40 tablet 0  . colchicine 0.6 MG tablet Take 1 tablet (0.6 mg total) by mouth every 8 (eight) hours as needed. 20 tablet 0   Facility-Administered Medications Prior to Visit  Medication Dose Route Frequency Provider Last Rate Last Dose  . dexamethasone (DECADRON) injection 4 mg  4 mg Intra-articular Once Cannon Kettle, Titorya, DPM      . triamcinolone acetonide (KENALOG) 10 MG/ML injection 10 mg  10 mg Other Once Cantrall, Titorya, DPM      . triamcinolone acetonide  (KENALOG) 10 MG/ML injection 10 mg  10 mg Other Once Landis Martins, DPM        ROS Review of Systems  Constitutional: Negative for activity change and appetite change.  HENT: Negative for sinus pressure and sore throat.   Eyes: Negative for visual disturbance.  Respiratory: Negative for cough, chest tightness and shortness of breath.   Cardiovascular: Negative for chest pain and leg swelling.  Gastrointestinal: Negative for abdominal distention, abdominal pain, constipation and diarrhea.  Endocrine: Negative.   Genitourinary: Negative for dysuria.  Musculoskeletal:       See hpi  Skin: Negative for rash.  Allergic/Immunologic: Negative.   Neurological: Negative for weakness, light-headedness and numbness.  Psychiatric/Behavioral: Negative for dysphoric mood and suicidal ideas.    Objective:  BP (!) 152/91   Pulse 78   Temp 97.6 F (36.4 C) (Oral)   Ht 5\' 9"  (1.753 m)   Wt 224 lb (101.6 kg)   SpO2 97%   BMI 33.08 kg/m   BP/Weight 03/25/2017 03/23/2017 3/76/2831  Systolic BP 517 616 073  Diastolic BP 91 99 99  Wt. (Lbs) 224 215 216  BMI 33.08 31.75 31.9      Physical Exam  Constitutional: He is oriented to person, place, and time. He appears well-developed and well-nourished.  Cardiovascular: Normal rate, normal heart sounds and intact distal pulses.  No murmur heard. Pulmonary/Chest: Effort normal and breath sounds normal. He has no wheezes. He has no rales. He exhibits no tenderness.  Abdominal: Soft. Bowel sounds are normal. He exhibits no distension and no mass. There is no tenderness.  Musculoskeletal: Normal range of motion. He exhibits tenderness (TTP of medial and lateral malleolus and on inversion and eversion). He exhibits no edema.  Neurological: He is alert and oriented to person, place, and time.    Lab Results  Component Value Date   HGBA1C 6.3 02/16/2017    Assessment & Plan:   1. Type 2 diabetes mellitus without complication, without long-term  current use of insulin (HCC) Controlled with A1c of 6.3 Counseled on Diabetic diet, my plate method, 710 minutes of moderate intensity exercise/week Keep blood sugar logs with fasting goals of 80-120 mg/dl, random of less than 180 and in the event of sugars less than 60 mg/dl or greater than 400 mg/dl please notify the clinic ASAP. It is recommended that you undergo annual eye exams and annual foot exams. Pneumonia vaccine is recommended. - POCT glucose (manual entry)  2. Primary osteoarthritis of left ankle Symptoms are suspicious for osteoarthritis We will send off uric acid to exclude gout flare Continue meloxicam Follow-up with podiatry - Uric Acid  3. Chronic idiopathic gout involving toe of left foot without tophus Educated on use of allopurinol for prophylaxis and colchicine for  gout flares - colchicine 0.6 MG tablet; Take 2 tabs (1.2 mg) orally at the onset of a gout attack, may repeat 1 tab (0.6 mg) in 2 hours if symptoms persist  Dispense: 20 tablet; Refill: 2   Meds ordered this encounter  Medications  . colchicine 0.6 MG tablet    Sig: Take 2 tabs (1.2 mg) orally at the onset of a gout attack, may repeat 1 tab (0.6 mg) in 2 hours if symptoms persist    Dispense:  20 tablet    Refill:  2    Follow-up: Return for Follow-up of chronic medical conditions, keep previously scheduled appointment.   [name] MD

## 2017-03-25 NOTE — Patient Instructions (Signed)

## 2017-03-26 ENCOUNTER — Other Ambulatory Visit: Payer: Self-pay | Admitting: Pharmacist

## 2017-03-26 ENCOUNTER — Encounter: Payer: Self-pay | Admitting: Family Medicine

## 2017-03-26 DIAGNOSIS — M1A0720 Idiopathic chronic gout, left ankle and foot, without tophus (tophi): Secondary | ICD-10-CM

## 2017-03-26 LAB — URIC ACID: URIC ACID: 5.5 mg/dL (ref 3.7–8.6)

## 2017-03-26 MED ORDER — COLCRYS 0.6 MG PO TABS: ORAL_TABLET | ORAL | 2 refills | Status: DC

## 2017-04-02 ENCOUNTER — Telehealth: Payer: Self-pay

## 2017-04-02 NOTE — Telephone Encounter (Signed)
Patient was called and vm is not set up to leave a message.   If patient returns phone call please inform patient that labs are normal.

## 2017-04-14 ENCOUNTER — Other Ambulatory Visit: Payer: Self-pay | Admitting: Sports Medicine

## 2017-04-14 MED FILL — traMADol HCL 50 MG TABS: 50 | 7 days supply | Qty: 14 | Fill #1

## 2017-04-14 MED FILL — ALLOPURINOL 300 MG TABS: 300 | 30 days supply | Qty: 60 | Fill #1

## 2017-04-14 MED FILL — MELOXICAM 7.5 MG TABLET: 7.5 | 30 days supply | Qty: 30 | Fill #1

## 2017-04-20 ENCOUNTER — Other Ambulatory Visit: Payer: Self-pay | Admitting: Family Medicine

## 2017-04-20 DIAGNOSIS — N528 Other male erectile dysfunction: Secondary | ICD-10-CM

## 2017-04-20 MED FILL — CYCLOBENZAPRINE 10 MG TAB: 10 | 30 days supply | Qty: 60 | Fill #1

## 2017-04-27 ENCOUNTER — Ambulatory Visit: Payer: BLUE CROSS/BLUE SHIELD | Attending: Family Medicine | Admitting: Family Medicine

## 2017-04-27 ENCOUNTER — Encounter: Payer: Self-pay | Admitting: Family Medicine

## 2017-04-27 VITALS — BP 152/76 | HR 94 | Temp 97.9°F | Ht 69.0 in | Wt 221.0 lb

## 2017-04-27 DIAGNOSIS — E119 Type 2 diabetes mellitus without complications: Secondary | ICD-10-CM | POA: Insufficient documentation

## 2017-04-27 DIAGNOSIS — Z885 Allergy status to narcotic agent status: Secondary | ICD-10-CM | POA: Diagnosis not present

## 2017-04-27 DIAGNOSIS — Z9889 Other specified postprocedural states: Secondary | ICD-10-CM | POA: Insufficient documentation

## 2017-04-27 DIAGNOSIS — N529 Male erectile dysfunction, unspecified: Secondary | ICD-10-CM | POA: Insufficient documentation

## 2017-04-27 DIAGNOSIS — M109 Gout, unspecified: Secondary | ICD-10-CM | POA: Insufficient documentation

## 2017-04-27 DIAGNOSIS — E785 Hyperlipidemia, unspecified: Secondary | ICD-10-CM | POA: Diagnosis not present

## 2017-04-27 DIAGNOSIS — N528 Other male erectile dysfunction: Secondary | ICD-10-CM

## 2017-04-27 DIAGNOSIS — Z79899 Other long term (current) drug therapy: Secondary | ICD-10-CM | POA: Insufficient documentation

## 2017-04-27 DIAGNOSIS — Z888 Allergy status to other drugs, medicaments and biological substances status: Secondary | ICD-10-CM | POA: Insufficient documentation

## 2017-04-27 DIAGNOSIS — M5431 Sciatica, right side: Secondary | ICD-10-CM | POA: Diagnosis not present

## 2017-04-27 DIAGNOSIS — M25511 Pain in right shoulder: Secondary | ICD-10-CM | POA: Diagnosis not present

## 2017-04-27 DIAGNOSIS — Z23 Encounter for immunization: Secondary | ICD-10-CM

## 2017-04-27 LAB — GLUCOSE, POCT (MANUAL RESULT ENTRY): POC GLUCOSE: 193 mg/dL — AB (ref 70–99)

## 2017-04-27 MED ORDER — TETANUS-DIPHTH-ACELL PERTUSSIS 5-2.5-18.5 LF-MCG/0.5 IM SUSP: 0.5000 mL | Freq: Once | INTRAMUSCULAR | 0 refills | Status: DC

## 2017-04-27 MED ORDER — PNEUMOCOCCAL VAC POLYVALENT 25 MCG/0.5ML IJ INJ: 0.5000 mL | INJECTION | Freq: Once | INTRAMUSCULAR | 0 refills | Status: AC

## 2017-04-27 MED ORDER — ACETAMINOPHEN-CODEINE #3 300-30 MG PO TABS: 1.0000 | ORAL_TABLET | Freq: Two times a day (BID) | ORAL | 0 refills | Status: DC | PRN

## 2017-04-27 MED ORDER — SILDENAFIL CITRATE 50 MG PO TABS: ORAL_TABLET | ORAL | 0 refills | Status: DC

## 2017-04-27 MED FILL — PNEUMOVAX 23 VIAL: 25 | 1 days supply | Qty: 1 | Fill #0

## 2017-04-27 NOTE — Progress Notes (Signed)
Patient is having pain from right shoulder down to right foot.

## 2017-04-27 NOTE — Patient Instructions (Signed)

## 2017-04-27 NOTE — Progress Notes (Signed)
Subjective:  Patient ID: Terry Poole, male    DOB: 08-11-1965  Age: 52 y.o. MRN: [id number]  CC: Pain   HPI Terry Poole is a 52 year old male with a history of type 2 diabetes mellitus (A1c 6.3, diet controlled) Hyperlipidemia, Gout for an acute visit complaining of a one-week history of pain in his right shoulder and right hip which radiates down his right lower extremity.  Pain is described as intermittent but severe when it occurs and hip pain is relieved by soaking his feet; denies preceding history of trauma. He denies heavy lifting even though he works in the [employer] as he states the heaviest thing he lifts is a Copy of fries. Denies dropping things, denies numbness in extremities. He has right-sided sciatica and remains on Flexeril and meloxicam which he states do not help and he even tried tramadol all to no avail.  Past Medical History:  Diagnosis Date  . Bronchitis   . Bronchitis   . Gout   . Hammertoe of second toe of right foot   . Smoker     Past Surgical History:  Procedure Laterality Date  . HAMMER TOE SURGERY Right 03/10/2016   Procedure: 2ND RIGHT HAMMER TOE CORRECTION;  Surgeon: Landis Martins, DPM;  Location: [place];  Service: Podiatry;  Laterality: Right;  . MASS EXCISION Right 03/10/2016   Procedure: EXCISION OF CALLUS 2ND RIGHT TOE;  Surgeon: Landis Martins, DPM;  Location: [place];  Service: Podiatry;  Laterality: Right;  . NO PAST SURGERIES      Allergies  Allergen Reactions  . Tramadol Hives  . Vicodin [Hydrocodone-Acetaminophen] Hives     Outpatient Medications Prior to Visit  Medication Sig Dispense Refill  . allopurinol (ZYLOPRIM) 300 MG tablet Take 1 tablet (300 mg total) by mouth 2 (two) times daily. 60 tablet 3  . atorvastatin (LIPITOR) 20 MG tablet Take 1 tablet (20 mg total) by mouth daily. 30 tablet 3  . ciclopirox (PENLAC) 8 % solution Apply topically at bedtime. Apply over nail and  surrounding skin. Apply daily after filing nail 6.6 mL 2  . COLCRYS 0.6 MG tablet Take 2 tabs (1.2 mg) orally at the onset of a gout attack, may repeat 1 tab (0.6 mg) in 2 hours if symptoms persist 20 tablet 2  . cyclobenzaprine (FLEXERIL) 10 MG tablet Take 1 tablet (10 mg total) by mouth 2 (two) times daily as needed for muscle spasms. 60 tablet 3  . gabapentin (NEURONTIN) 250 MG/5ML solution Take 5 mLs (250 mg total) by mouth 2 (two) times daily. 300 mL 3  . indomethacin (INDOCIN) 50 MG capsule Take 1 capsule (50 mg total) by mouth 2 (two) times daily with a meal. 30 capsule 0  . meloxicam (MOBIC) 7.5 MG tablet Take 1 tablet (7.5 mg total) by mouth daily. 30 tablet 3  . terbinafine (LAMISIL) 250 MG tablet Take 1 tablet (250 mg total) by mouth daily. 90 tablet 0  . terbinafine (LAMISIL) 250 MG tablet Take 1 tablet (250 mg total) by mouth daily. 30 tablet 0  . acetaminophen-codeine (TYLENOL #3) 300-30 MG tablet Take 1 tablet by mouth every 8 (eight) hours as needed for moderate pain. 5 tablet 0  . traMADol (ULTRAM) 50 MG tablet Take 1 tablet (50 mg total) by mouth every 12 (twelve) hours as needed. 40 tablet 0  . VIAGRA 50 MG tablet TAKE 1 TABLET BY MOUTH DAILY AS NEEDED FOR ERECTILE DYSFUNCTION. AT LEAST 24 HOURS BETWEEN  DOSES 30 tablet 0  . acetaminophen-codeine (TYLENOL #3) 300-30 MG tablet Take 1 tablet by mouth every 8 (eight) hours as needed for moderate pain. (Patient not taking: Reported on 04/27/2017) 5 tablet 0  . lisinopril (PRINIVIL,ZESTRIL) 5 MG tablet Take 1 tablet (5 mg total) by mouth daily. (Patient not taking: Reported on 04/27/2017) 30 tablet 3   Facility-Administered Medications Prior to Visit  Medication Dose Route Frequency Provider Last Rate Last Dose  . dexamethasone (DECADRON) injection 4 mg  4 mg Intra-articular Once [place] Kettle, [place]itory[place], DPM      . triamcinolone acetonide (KENALOG) 10 MG/ML injection 10 mg  10 mg Other Once [place], Titorya, DPM      . triamcinolone acetonide  (KENALOG) 10 MG/ML injection 10 mg  10 mg Other Once Landis Martins, DPM        ROS Review of Systems  Constitutional: Negative for activity change and appetite change.  HENT: Negative for sinus pressure and sore throat.   Eyes: Negative for visual disturbance.  Respiratory: Negative for cough, chest tightness and shortness of breath.   Cardiovascular: Negative for chest pain and leg swelling.  Gastrointestinal: Negative for abdominal distention, abdominal pain, constipation and diarrhea.  Endocrine: Negative.   Genitourinary: Negative for dysuria.  Musculoskeletal:       See hpi  Skin: Negative for rash.  Allergic/Immunologic: Negative.   Neurological: Negative for weakness, light-headedness and numbness.  Psychiatric/Behavioral: Negative for dysphoric mood and suicidal ideas.    Objective:  BP (!) 152/76   Pulse 94   Temp 97.9 F (36.6 C) (Oral)   Ht 5\' 9"  (1.753 m)   Wt 221 lb (100.2 kg)   SpO2 97%   BMI 32.64 kg/m   BP/Weight 04/27/2017 03/25/2017 1/88/4166  Systolic BP 063 016 010  Diastolic BP 76 91 99  Wt. (Lbs) 221 224 215  BMI 32.64 33.08 31.75     Physical Exam  Constitutional: He is oriented to person, place, and time. He appears well-developed and well-nourished.  Cardiovascular: Normal rate, normal heart sounds and intact distal pulses.  No murmur heard. Pulmonary/Chest: Effort normal and breath sounds normal. He has no wheezes. He has no rales. He exhibits no tenderness.  Abdominal: Soft. Bowel sounds are normal. He exhibits no distension and no mass. There is no tenderness.  Musculoskeletal: Normal range of motion. He exhibits no tenderness (no TTP on palpation or ROM of R shoulder).  No lumbar spine tenderness Positive straight leg raise on the right; negative straight leg raise on the left  Neurological: He is alert and oriented to person, place, and time.  Skin: Skin is warm and dry.  Psychiatric: He has a normal mood and affect.     Lab Results   Component Value Date   HGBA1C 6.3 02/16/2017    Assessment & Plan:   1. Type 2 diabetes mellitus without complication, without long-term current use of insulin (HCC) Controlled with A1c of 6.3 - POCT glucose (manual entry)  2. Acute pain of right shoulder Likely underlying osteoarthritis Placed on Tylenol No. 3-short-term Continue meloxicam  3. Other male erectile dysfunction - sildenafil (VIAGRA) 50 MG tablet; TAKE 1 TABLET BY MOUTH DAILY AS NEEDED FOR ERECTILE DYSFUNCTION. AT LEAST 24 HOURS BETWEEN DOSES  Dispense: 30 tablet; Refill: 0  4. Sciatica of right side Advised to apply heat Continue Flexeril, NSAIDs, Tylenol No. 3   Meds ordered this encounter  Medications  . pneumococcal 23 valent vaccine (PNU-IMMUNE) 25 MCG/0.5ML injection    Sig: Inject  0.5 mLs into the muscle once for 1 dose.    Dispense:  2.5 mL    Refill:  0  . Tdap (BOOSTRIX) 5-2.5-18.5 LF-MCG/0.5 injection    Sig: Inject 0.5 mLs into the muscle once for 1 dose.    Dispense:  0.5 mL    Refill:  0  . acetaminophen-codeine (TYLENOL #3) 300-30 MG tablet    Sig: Take 1 tablet by mouth every 12 (twelve) hours as needed for moderate pain.    Dispense:  30 tablet    Refill:  0  . sildenafil (VIAGRA) 50 MG tablet    Sig: TAKE 1 TABLET BY MOUTH DAILY AS NEEDED FOR ERECTILE DYSFUNCTION. AT LEAST 24 HOURS BETWEEN DOSES    Dispense:  30 tablet    Refill:  0    Follow-up: Return for Follow-up of chronic medical conditions, keep previously scheduled appointment.   [name] MD

## 2017-05-04 ENCOUNTER — Encounter: Payer: Self-pay | Admitting: Family Medicine

## 2017-05-04 ENCOUNTER — Ambulatory Visit: Payer: BLUE CROSS/BLUE SHIELD | Attending: Family Medicine | Admitting: Family Medicine

## 2017-05-04 VITALS — BP 167/87 | HR 84 | Temp 97.5°F | Ht 69.0 in | Wt 223.0 lb

## 2017-05-04 DIAGNOSIS — M109 Gout, unspecified: Secondary | ICD-10-CM | POA: Insufficient documentation

## 2017-05-04 DIAGNOSIS — S025XXA Fracture of tooth (traumatic), initial encounter for closed fracture: Secondary | ICD-10-CM | POA: Insufficient documentation

## 2017-05-04 DIAGNOSIS — R030 Elevated blood-pressure reading, without diagnosis of hypertension: Secondary | ICD-10-CM | POA: Insufficient documentation

## 2017-05-04 DIAGNOSIS — Z79899 Other long term (current) drug therapy: Secondary | ICD-10-CM | POA: Insufficient documentation

## 2017-05-04 DIAGNOSIS — X58XXXA Exposure to other specified factors, initial encounter: Secondary | ICD-10-CM | POA: Insufficient documentation

## 2017-05-04 DIAGNOSIS — E785 Hyperlipidemia, unspecified: Secondary | ICD-10-CM | POA: Diagnosis not present

## 2017-05-04 DIAGNOSIS — E118 Type 2 diabetes mellitus with unspecified complications: Secondary | ICD-10-CM | POA: Diagnosis not present

## 2017-05-04 DIAGNOSIS — K0889 Other specified disorders of teeth and supporting structures: Secondary | ICD-10-CM | POA: Diagnosis not present

## 2017-05-04 LAB — POCT GLYCOSYLATED HEMOGLOBIN (HGB A1C): Hemoglobin A1C: 6.4

## 2017-05-04 LAB — GLUCOSE, POCT (MANUAL RESULT ENTRY): POC Glucose: 163 mg/dl — AB (ref 70–99)

## 2017-05-04 MED ORDER — AMOXICILLIN 500 MG PO CAPS: 500.0000 mg | ORAL_CAPSULE | Freq: Three times a day (TID) | ORAL | 0 refills | Status: DC

## 2017-05-04 MED FILL — AMOXICILLIN 500 MG CAPSULE: 500 | 10 days supply | Qty: 30 | Fill #0

## 2017-05-04 NOTE — Progress Notes (Signed)
Subjective:  Patient ID: Terry Poole, male    DOB: September 13, 1965  Age: 52 y.o. MRN: [id number]  CC: Dental Pain   HPI Terry Poole is a 52 year old male with a history of type 2 diabetes mellitus (A1c 6.4, diet controlled) Hyperlipidemia, Gout who presents today for an acute visit complaining of right upper premolar toothache which started at about 8 PM last night on a partially broken tooth which he had extracted by himself manually in the past. He has used his Tylenol No. 3 with some relief but has noticed swelling of his gum in his cheek but does not have fever.  He is yet to call the dentist to make an appointment.   Past Medical History:  Diagnosis Date  . Bronchitis   . Bronchitis   . Gout   . Hammertoe of second toe of right foot   . Smoker     Past Surgical History:  Procedure Laterality Date  . HAMMER TOE SURGERY Right 03/10/2016   Procedure: 2ND RIGHT HAMMER TOE CORRECTION;  Surgeon: Landis Martins, DPM;  Location: [facility];  Service: Podiatry;  Laterality: Right;  . MASS EXCISION Right 03/10/2016   Procedure: EXCISION OF CALLUS 2ND RIGHT TOE;  Surgeon: Landis Martins, DPM;  Location: [place];  Service: Podiatry;  Laterality: Right;  . NO PAST SURGERIES      History reviewed. No pertinent family history.  Allergies  Allergen Reactions  . Vicodin [Hydrocodone-Acetaminophen] Hives     Outpatient Medications Prior to Visit  Medication Sig Dispense Refill  . acetaminophen-codeine (TYLENOL #3) 300-30 MG tablet Take 1 tablet by mouth every 12 (twelve) hours as needed for moderate pain. 30 tablet 0  . allopurinol (ZYLOPRIM) 300 MG tablet Take 1 tablet (300 mg total) by mouth 2 (two) times daily. 60 tablet 3  . atorvastatin (LIPITOR) 20 MG tablet Take 1 tablet (20 mg total) by mouth daily. 30 tablet 3  . ciclopirox (PENLAC) 8 % solution Apply topically at bedtime. Apply over nail and surrounding skin. Apply daily after filing nail 6.6 mL 2    . COLCRYS 0.6 MG tablet Take 2 tabs (1.2 mg) orally at the onset of a gout attack, may repeat 1 tab (0.6 mg) in 2 hours if symptoms persist 20 tablet 2  . cyclobenzaprine (FLEXERIL) 10 MG tablet Take 1 tablet (10 mg total) by mouth 2 (two) times daily as needed for muscle spasms. 60 tablet 3  . gabapentin (NEURONTIN) 250 MG/5ML solution Take 5 mLs (250 mg total) by mouth 2 (two) times daily. 300 mL 3  . indomethacin (INDOCIN) 50 MG capsule Take 1 capsule (50 mg total) by mouth 2 (two) times daily with a meal. 30 capsule 0  . meloxicam (MOBIC) 7.5 MG tablet Take 1 tablet (7.5 mg total) by mouth daily. 30 tablet 3  . sildenafil (VIAGRA) 50 MG tablet TAKE 1 TABLET BY MOUTH DAILY AS NEEDED FOR ERECTILE DYSFUNCTION. AT LEAST 24 HOURS BETWEEN DOSES 30 tablet 0  . terbinafine (LAMISIL) 250 MG tablet Take 1 tablet (250 mg total) by mouth daily. 90 tablet 0  . terbinafine (LAMISIL) 250 MG tablet Take 1 tablet (250 mg total) by mouth daily. 30 tablet 0  . acetaminophen-codeine (TYLENOL #3) 300-30 MG tablet Take 1 tablet by mouth every 8 (eight) hours as needed for moderate pain. (Patient not taking: Reported on 04/27/2017) 5 tablet 0  . lisinopril (PRINIVIL,ZESTRIL) 5 MG tablet Take 1 tablet (5 mg total) by mouth daily. (Patient  not taking: Reported on 04/27/2017) 30 tablet 3   Facility-Administered Medications Prior to Visit  Medication Dose Route Frequency Provider Last Rate Last Dose  . dexamethasone (DECADRON) injection 4 mg  4 mg Intra-articular Once [place], Titorya, DPM      . triamcinolone acetonide (KENALOG) 10 MG/ML injection 10 mg  10 mg Other Once [place], Titorya, DPM      . triamcinolone acetonide (KENALOG) 10 MG/ML injection 10 mg  10 mg Other Once Landis Martins, DPM        ROS Review of Systems  Constitutional: Negative for activity change and appetite change.  HENT: Positive for dental problem. Negative for sinus pressure and sore throat.   Eyes: Negative for visual disturbance.   Respiratory: Negative for cough, chest tightness and shortness of breath.   Cardiovascular: Negative for chest pain and leg swelling.  Gastrointestinal: Negative for abdominal distention, abdominal pain, constipation and diarrhea.  Endocrine: Negative.   Genitourinary: Negative for dysuria.  Musculoskeletal: Negative for joint swelling and myalgias.  Skin: Negative for rash.  Allergic/Immunologic: Negative.   Neurological: Negative for weakness, light-headedness and numbness.  Psychiatric/Behavioral: Negative for dysphoric mood and suicidal ideas.    Objective:  BP (!) 167/87   Pulse 84   Temp (!) 97.5 F (36.4 C) (Oral)   Ht 5\' 9"  (1.753 m)   Wt 223 lb (101.2 kg)   SpO2 95%   BMI 32.93 kg/m   BP/Weight 05/04/2017 04/27/2017 07/30/348  Systolic BP 093 818 299  Diastolic BP 87 76 91  Wt. (Lbs) 223 221 224  BMI 32.93 32.64 33.08      Physical Exam  Constitutional: He is oriented to person, place, and time. He appears well-developed and well-nourished.  HENT:  Loss of a couple of teeth with dental caries on others Slight inflammation and gums surrounding last upper premolar with no evidence of abscess.  Neck:  Right submandibular lymphadenopathy which is not tender  Cardiovascular: Normal rate, normal heart sounds and intact distal pulses.  No murmur heard. Pulmonary/Chest: Effort normal and breath sounds normal. He has no wheezes. He has no rales. He exhibits no tenderness.  Abdominal: Soft. Bowel sounds are normal. He exhibits no distension and no mass. There is no tenderness.  Musculoskeletal: Normal range of motion.  Neurological: He is alert and oriented to person, place, and time.    Lab Results  Component Value Date   HGBA1C 6.4 05/04/2017    Assessment & Plan:   1. Type 2 diabetes mellitus with complication, without long-term current use of insulin (HCC) A1c of 6.4 Diet controlled - POCT glucose (manual entry) - POCT glycosylated hemoglobin (Hb A1C)  2.  Tooth ache Advised to call the dentist for an appointment meanwhile we will place him on amoxicillin He has leftover Tylenol No. 3 pills for pain Elevated blood pressure today likely from acute pain - amoxicillin (AMOXIL) 500 MG capsule; Take 1 capsule (500 mg total) by mouth 3 (three) times daily.  Dispense: 30 capsule; Refill: 0   Meds ordered this encounter  Medications  . amoxicillin (AMOXIL) 500 MG capsule    Sig: Take 1 capsule (500 mg total) by mouth 3 (three) times daily.    Dispense:  30 capsule    Refill:  0    Follow-up: Return for Follow-up of chronic medical conditions, keep previously scheduled appointment.   [name] MD

## 2017-05-04 NOTE — Patient Instructions (Signed)
Dental Pain Dental pain may be caused by many things, including:  Tooth decay (cavities or caries). Cavities cause the nerve of your tooth to be open to air and hot or cold temperatures. This can cause pain or discomfort.  Abscess or infection. A dental abscess is an area that is full of infected pus from a bacterial infection in the inner part of the tooth (pulp). It usually happens at the end of the tooth's root.  Injury.  An unknown reason (idiopathic).  Your pain may be mild or severe. It may only happen when:  You are chewing.  You are exposed to hot or cold temperature.  You are eating or drinking sugary foods or beverages, such as: ? Soda. ? Candy.  Your pain may also be there all of the time. Follow these instructions at home: Watch your dental pain for any changes. Do these things to lessen your discomfort:  Take medicines only as told by your dentist.  If your dentist tells you to take an antibiotic medicine, finish all of it even if you start to feel better.  Keep all follow-up visits as told by your dentist. This is important.  Do not apply heat to the outside of your face.  Rinse your mouth or gargle with salt water if told by your dentist. This helps with pain and swelling. ? You can make salt water by adding  tsp of salt to 1 cup of warm water.  Apply ice to the painful area of your face: ? Put ice in a plastic bag. ? Place a towel between your skin and the bag. ? Leave the ice on for 20 minutes, 2-3 times per day.  Avoid foods or drinks that cause you pain, such as: ? Very hot or very cold foods or drinks. ? Sweet or sugary foods or drinks.  Contact a doctor if:  Your pain is not helped with medicines.  Your symptoms are worse.  You have new symptoms. Get help right away if:  You cannot open your mouth.  You are having trouble breathing or swallowing.  You have a fever.  Your face, neck, or jaw is puffy (swollen). This information is not  intended to replace advice given to you by your health care provider. Make sure you discuss any questions you have with your health care provider. Document Released: 07/09/2007 Document Revised: 06/28/2015 Document Reviewed: 01/16/2014 [employer] Interactive Patient Education  2018 [employer]  

## 2017-05-18 ENCOUNTER — Other Ambulatory Visit: Payer: Self-pay | Admitting: Family Medicine

## 2017-05-18 DIAGNOSIS — K0889 Other specified disorders of teeth and supporting structures: Secondary | ICD-10-CM

## 2017-05-18 MED FILL — ALLOPURINOL 300 MG TAB: 300 | 30 days supply | Qty: 60 | Fill #2

## 2017-05-18 MED FILL — traMADol HCL 50 MG TABS: 50 | 6 days supply | Qty: 12 | Fill #2

## 2017-05-18 MED FILL — CYCLOBENZAPRINE 10 MG TAB: 10 | 30 days supply | Qty: 60 | Fill #2

## 2017-05-18 MED FILL — MELOXICAM 7.5 MG TABLET: 7.5 | 30 days supply | Qty: 30 | Fill #2

## 2017-05-26 ENCOUNTER — Ambulatory Visit: Payer: BLUE CROSS/BLUE SHIELD | Admitting: Family Medicine

## 2017-06-01 ENCOUNTER — Ambulatory Visit: Payer: BLUE CROSS/BLUE SHIELD | Attending: Family Medicine | Admitting: Family Medicine

## 2017-06-01 ENCOUNTER — Encounter

## 2017-06-01 ENCOUNTER — Encounter: Payer: Self-pay | Admitting: Family Medicine

## 2017-06-01 VITALS — BP 142/80 | HR 83 | Temp 97.6°F | Wt 219.4 lb

## 2017-06-01 DIAGNOSIS — M1A00X0 Idiopathic chronic gout, unspecified site, without tophus (tophi): Secondary | ICD-10-CM | POA: Diagnosis not present

## 2017-06-01 DIAGNOSIS — Z9114 Patient's other noncompliance with medication regimen: Secondary | ICD-10-CM | POA: Insufficient documentation

## 2017-06-01 DIAGNOSIS — F17200 Nicotine dependence, unspecified, uncomplicated: Secondary | ICD-10-CM | POA: Insufficient documentation

## 2017-06-01 DIAGNOSIS — I10 Essential (primary) hypertension: Secondary | ICD-10-CM | POA: Diagnosis not present

## 2017-06-01 DIAGNOSIS — Z79899 Other long term (current) drug therapy: Secondary | ICD-10-CM | POA: Insufficient documentation

## 2017-06-01 DIAGNOSIS — E1165 Type 2 diabetes mellitus with hyperglycemia: Secondary | ICD-10-CM

## 2017-06-01 DIAGNOSIS — K047 Periapical abscess without sinus: Secondary | ICD-10-CM | POA: Diagnosis not present

## 2017-06-01 DIAGNOSIS — N529 Male erectile dysfunction, unspecified: Secondary | ICD-10-CM | POA: Insufficient documentation

## 2017-06-01 DIAGNOSIS — N528 Other male erectile dysfunction: Secondary | ICD-10-CM | POA: Diagnosis not present

## 2017-06-01 DIAGNOSIS — E7800 Pure hypercholesterolemia, unspecified: Secondary | ICD-10-CM | POA: Diagnosis not present

## 2017-06-01 DIAGNOSIS — E785 Hyperlipidemia, unspecified: Secondary | ICD-10-CM | POA: Insufficient documentation

## 2017-06-01 DIAGNOSIS — K0889 Other specified disorders of teeth and supporting structures: Secondary | ICD-10-CM | POA: Diagnosis present

## 2017-06-01 LAB — GLUCOSE, POCT (MANUAL RESULT ENTRY): POC Glucose: 103 mg/dl — AB (ref 70–99)

## 2017-06-01 MED ORDER — CLINDAMYCIN HCL 300 MG PO CAPS: 300.0000 mg | ORAL_CAPSULE | Freq: Two times a day (BID) | ORAL | 0 refills | Status: DC

## 2017-06-01 MED ORDER — LISINOPRIL 5 MG PO TABS: 5.0000 mg | ORAL_TABLET | Freq: Every day | ORAL | 3 refills | Status: DC

## 2017-06-01 MED ORDER — COLCRYS 0.6 MG PO TABS: ORAL_TABLET | ORAL | 2 refills | Status: DC

## 2017-06-01 MED ORDER — COLCHICINE 0.6 MG PO CAPS: ORAL_CAPSULE | ORAL | 2 refills | Status: DC

## 2017-06-01 MED ORDER — ATORVASTATIN CALCIUM 20 MG PO TABS: 20.0000 mg | ORAL_TABLET | Freq: Every day | ORAL | 3 refills | Status: DC

## 2017-06-01 MED ORDER — ACETAMINOPHEN-CODEINE #3 300-30 MG PO TABS: 1.0000 | ORAL_TABLET | Freq: Every evening | ORAL | 0 refills | Status: DC | PRN

## 2017-06-01 MED ORDER — ALLOPURINOL 300 MG PO TABS: 300.0000 mg | ORAL_TABLET | Freq: Two times a day (BID) | ORAL | 3 refills | Status: DC

## 2017-06-01 MED ORDER — TADALAFIL 10 MG PO TABS: 10.0000 mg | ORAL_TABLET | ORAL | 1 refills | Status: DC | PRN

## 2017-06-01 MED FILL — CLINDAMYCIN HCL 300 MG CAP: 300 | 10 days supply | Qty: 20 | Fill #0

## 2017-06-01 MED FILL — ACETAMINOPHEN/COD #3 TABLET: 300-30 | 10 days supply | Qty: 10 | Fill #0

## 2017-06-01 MED FILL — ATORVASTATIN 20 MG TABLET: 20 | 30 days supply | Qty: 30 | Fill #0

## 2017-06-01 MED FILL — LISINOPRIL 5 MG TAB: 5 | 30 days supply | Qty: 30 | Fill #0

## 2017-06-01 NOTE — Progress Notes (Signed)
Subjective:  Patient ID: Terry Poole, male    DOB: [birth year]  Age: 52 y.o. MRN: [id number]  CC: Dental Pain and Diabetes   HPI Terry Poole is a 52 year old male with a history of type 2 diabetes mellitus (A1c 6.4, diet controlled) Hyperlipidemia, Gout who presents today for follow-up visit. He complains of right upper premolar pain and abscess and was treated for the same 3 weeks ago with amoxicillin but he is yet to make an appointment with the dentist as he states his busy schedule has precluded this.  He denies fever or facial swelling. He complains of difficulty obtaining Viagra which he previously received for erectile dysfunction due to the cost on his wondering about a cheaper alternative.  His diabetes is diet controlled and he denies numbness in extremities, visual concerns. Denies recent gout flares. He has not been taking his lisinopril because he felt his stress levels have contributed to his elevated blood pressure and now he has cut down on his number of jobs and is no longer stressed but his blood pressure is slightly elevated He tolerates his statin with no complaints of myalgia.  Past Medical History:  Diagnosis Date  . Bronchitis   . Bronchitis   . Gout   . Hammertoe of second toe of right foot   . Smoker     Past Surgical History:  Procedure Laterality Date  . HAMMER TOE SURGERY Right 03/10/2016   Procedure: 2ND RIGHT HAMMER TOE CORRECTION;  Surgeon: Landis Martins, DPM;  Location: [facility];  Service: Podiatry;  Laterality: Right;  . MASS EXCISION Right 03/10/2016   Procedure: EXCISION OF CALLUS 2ND RIGHT TOE;  Surgeon: Landis Martins, DPM;  Location: [facility];  Service: Podiatry;  Laterality: Right;  . NO PAST SURGERIES      Allergies  Allergen Reactions  . Vicodin [Hydrocodone-Acetaminophen] Hives     Outpatient Medications Prior to Visit  Medication Sig Dispense Refill  . acetaminophen-codeine (TYLENOL #3) 300-30 MG  tablet Take 1 tablet by mouth every 8 (eight) hours as needed for moderate pain. 5 tablet 0  . ciclopirox (PENLAC) 8 % solution Apply topically at bedtime. Apply over nail and surrounding skin. Apply daily after filing nail 6.6 mL 2  . cyclobenzaprine (FLEXERIL) 10 MG tablet Take 1 tablet (10 mg total) by mouth 2 (two) times daily as needed for muscle spasms. 60 tablet 3  . gabapentin (NEURONTIN) 250 MG/5ML solution Take 5 mLs (250 mg total) by mouth 2 (two) times daily. 300 mL 3  . indomethacin (INDOCIN) 50 MG capsule Take 1 capsule (50 mg total) by mouth 2 (two) times daily with a meal. 30 capsule 0  . meloxicam (MOBIC) 7.5 MG tablet Take 1 tablet (7.5 mg total) by mouth daily. 30 tablet 3  . terbinafine (LAMISIL) 250 MG tablet Take 1 tablet (250 mg total) by mouth daily. 90 tablet 0  . terbinafine (LAMISIL) 250 MG tablet Take 1 tablet (250 mg total) by mouth daily. 30 tablet 0  . acetaminophen-codeine (TYLENOL #3) 300-30 MG tablet Take 1 tablet by mouth every 12 (twelve) hours as needed for moderate pain. 30 tablet 0  . allopurinol (ZYLOPRIM) 300 MG tablet Take 1 tablet (300 mg total) by mouth 2 (two) times daily. 60 tablet 3  . atorvastatin (LIPITOR) 20 MG tablet Take 1 tablet (20 mg total) by mouth daily. 30 tablet 3  . COLCRYS 0.6 MG tablet Take 2 tabs (1.2 mg) orally at the onset of a  gout attack, may repeat 1 tab (0.6 mg) in 2 hours if symptoms persist 20 tablet 2  . sildenafil (VIAGRA) 50 MG tablet TAKE 1 TABLET BY MOUTH DAILY AS NEEDED FOR ERECTILE DYSFUNCTION. AT LEAST 24 HOURS BETWEEN DOSES 30 tablet 0  . amoxicillin (AMOXIL) 500 MG capsule Take 1 capsule (500 mg total) by mouth 3 (three) times daily. (Patient not taking: Reported on 06/01/2017) 30 capsule 0  . lisinopril (PRINIVIL,ZESTRIL) 5 MG tablet Take 1 tablet (5 mg total) by mouth daily. (Patient not taking: Reported on 04/27/2017) 30 tablet 3   Facility-Administered Medications Prior to Visit  Medication Dose Route Frequency  Provider Last Rate Last Dose  . dexamethasone (DECADRON) injection 4 mg  4 mg Intra-articular Once [place], [place], DPM      . triamcinolone acetonide (KENALOG) 10 MG/ML injection 10 mg  10 mg Other Once [place], Titorya, DPM      . triamcinolone acetonide (KENALOG) 10 MG/ML injection 10 mg  10 mg Other Once Landis Martins, DPM        ROS Review of Systems  Constitutional: Negative for activity change and appetite change.  HENT: Positive for dental problem. Negative for sinus pressure and sore throat.   Eyes: Negative for visual disturbance.  Respiratory: Negative for cough, chest tightness and shortness of breath.   Cardiovascular: Negative for chest pain and leg swelling.  Gastrointestinal: Negative for abdominal distention, abdominal pain, constipation and diarrhea.  Endocrine: Negative.   Genitourinary: Negative for dysuria.  Musculoskeletal: Negative for joint swelling and myalgias.  Skin: Negative for rash.  Allergic/Immunologic: Negative.   Neurological: Negative for weakness, light-headedness and numbness.  Psychiatric/Behavioral: Negative for dysphoric mood and suicidal ideas.    Objective:  BP (!) 142/80   Pulse 83   Temp 97.6 F (36.4 C) (Oral)   Wt 219 lb 6.4 oz (99.5 kg)   SpO2 97%   BMI 32.40 kg/m   BP/Weight 06/01/2017 05/04/2017 6/83/4196  Systolic BP 222 979 892  Diastolic BP 80 87 76  Wt. (Lbs) 219.4 223 221  BMI 32.4 32.93 32.64      Physical Exam  Constitutional: He is oriented to person, place, and time. He appears well-developed and well-nourished.  HENT:  Mouth/Throat: Oropharynx is clear and moist. Dental abscesses (Last right upper premolar) present.  Cardiovascular: Normal rate, normal heart sounds and intact distal pulses.  No murmur heard. Pulmonary/Chest: Effort normal and breath sounds normal. He has no wheezes. He has no rales. He exhibits no tenderness.  Abdominal: Soft. Bowel sounds are normal. He exhibits no distension and no mass. There  is no tenderness.  Musculoskeletal: Normal range of motion.  Neurological: He is alert and oriented to person, place, and time.  Skin: Skin is warm and dry.  Psychiatric: He has a normal mood and affect.    CMP Latest Ref Rng & Units 02/17/2017 09/15/2016 05/13/2016  Glucose 65 - 99 mg/dL - 87 92  BUN 6 - 24 mg/dL - 11 7  Creatinine 0.76 - 1.27 mg/dL - 1.08 0.94  Sodium 134 - 144 mmol/L - 141 141  Potassium 3.5 - 5.2 mmol/L - 4.4 4.2  Chloride 96 - 106 mmol/L - 104 100  CO2 20 - 29 mmol/L - 23 25  Calcium 8.7 - 10.2 mg/dL - 9.1 9.8  Total Protein 6.1 - 8.1 g/dL 7.2 6.9 7.3  Total Bilirubin 0.2 - 1.2 mg/dL 0.3 0.3 0.4  Alkaline Phos 39 - 117 IU/L - 131(H) 102  AST 10 - 35  U/L 20 27 39  ALT 9 - 46 U/L 26 44 78(H)    Lipid Panel     Component Value Date/Time   CHOL 218 (H) 05/13/2016 1627   TRIG 193 (H) 05/13/2016 1627   HDL 57 05/13/2016 1627   CHOLHDL 3.8 05/13/2016 1627   CHOLHDL 2.6 09/21/2015 1053   VLDL 47 (H) 09/21/2015 1053   LDLCALC 122 (H) 05/13/2016 1627      Lab Results  Component Value Date   HGBA1C 6.4 05/04/2017    Assessment & Plan:   1. Type 2 diabetes mellitus with hyperglycemia, without long-term current use of insulin (HCC) Diet controlled with A1c of 6.4 Diabetic diet, lifestyle modifications - POCT glucose (manual entry) - Ambulatory referral to Ophthalmology - CMP14+EGFR; Future - Lipid panel; Future - Microalbumin/Creatinine Ratio, Urine; Future  2. Tooth abscess Strongly advised to schedule an appointment with the dentist - acetaminophen-codeine (TYLENOL #3) 300-30 MG tablet; Take 1 tablet by mouth at bedtime as needed for moderate pain.  Dispense: 10 tablet; Refill: 0 - clindamycin (CLEOCIN) 300 MG capsule; Take 1 capsule (300 mg total) by mouth 2 (two) times daily.  Dispense: 20 capsule; Refill: 0  3. Other male erectile dysfunction Unable to afford Viagra Change to Cialis - tadalafil (CIALIS) 10 MG tablet; Take 1 tablet (10 mg total)  by mouth every other day as needed for erectile dysfunction.  Dispense: 20 tablet; Refill: 1  4. Idiopathic chronic gout without tophus, unspecified site No recent flare - allopurinol (ZYLOPRIM) 300 MG tablet; Take 1 tablet (300 mg total) by mouth 2 (two) times daily.  Dispense: 60 tablet; Refill: 3  5. Pure hypercholesterolemia Stable - atorvastatin (LIPITOR) 20 MG tablet; Take 1 tablet (20 mg total) by mouth daily.  Dispense: 30 tablet; Refill: 3  6. Essential hypertension Slightly elevated blood pressure Not compliant with lisinopril and compliance has been emphasized. - lisinopril (PRINIVIL,ZESTRIL) 5 MG tablet; Take 1 tablet (5 mg total) by mouth daily.  Dispense: 30 tablet; Refill: 3   Meds ordered this encounter  Medications  . tadalafil (CIALIS) 10 MG tablet    Sig: Take 1 tablet (10 mg total) by mouth every other day as needed for erectile dysfunction.    Dispense:  20 tablet    Refill:  1  . acetaminophen-codeine (TYLENOL #3) 300-30 MG tablet    Sig: Take 1 tablet by mouth at bedtime as needed for moderate pain.    Dispense:  10 tablet    Refill:  0  . clindamycin (CLEOCIN) 300 MG capsule    Sig: Take 1 capsule (300 mg total) by mouth 2 (two) times daily.    Dispense:  20 capsule    Refill:  0  . allopurinol (ZYLOPRIM) 300 MG tablet    Sig: Take 1 tablet (300 mg total) by mouth 2 (two) times daily.    Dispense:  60 tablet    Refill:  3    Discontinue previous dose  . atorvastatin (LIPITOR) 20 MG tablet    Sig: Take 1 tablet (20 mg total) by mouth daily.    Dispense:  30 tablet    Refill:  3  . COLCRYS 0.6 MG tablet    Sig: Take 2 tabs (1.2 mg) orally at the onset of a gout attack, may repeat 1 tab (0.6 mg) in 2 hours if symptoms persist    Dispense:  20 tablet    Refill:  2    Brand preferred for insurance  . lisinopril (PRINIVIL,ZESTRIL)  5 MG tablet    Sig: Take 1 tablet (5 mg total) by mouth daily.    Dispense:  30 tablet    Refill:  3    Follow-up:  Return in about 3 months (around 08/31/2017) for follow up of chronic medical conditions.   [name] MD

## 2017-06-01 NOTE — Patient Instructions (Signed)
Dental Abscess A dental abscess is pus in or around a tooth. Follow these instructions at home:  Take medicines only as told by your dentist.  If you were prescribed antibiotic medicine, finish all of it even if you start to feel better.  Rinse your mouth (gargle) often with salt water.  Do not drive or use heavy machinery, like a lawn mower, while taking pain medicine.  Do not apply heat to the outside of your mouth.  Keep all follow-up visits as told by your dentist. This is important. Contact a doctor if:  Your pain is worse, and medicine does not help. Get help right away if:  You have a fever or chills.  Your symptoms suddenly get worse.  You have a very bad headache.  You have problems breathing or swallowing.  You have trouble opening your mouth.  You have puffiness (swelling) in your neck or around your eye. This information is not intended to replace advice given to you by your health care provider. Make sure you discuss any questions you have with your health care provider. Document Released: 06/06/2014 Document Revised: 06/28/2015 Document Reviewed: 01/17/2014 Elsevier Interactive Patient Education  2018 [employer]  

## 2017-06-01 NOTE — Progress Notes (Signed)
219.4 

## 2017-06-02 ENCOUNTER — Ambulatory Visit: Payer: BLUE CROSS/BLUE SHIELD | Admitting: Family Medicine

## 2017-06-02 ENCOUNTER — Encounter: Payer: Self-pay | Admitting: Family Medicine

## 2017-06-11 ENCOUNTER — Ambulatory Visit: Payer: Self-pay | Attending: Family Medicine

## 2017-06-11 DIAGNOSIS — E1165 Type 2 diabetes mellitus with hyperglycemia: Secondary | ICD-10-CM | POA: Insufficient documentation

## 2017-06-11 NOTE — Progress Notes (Signed)
Patient here for lab visit only 

## 2017-06-12 LAB — LIPID PANEL
Chol/HDL Ratio: 2.6 ratio (ref 0.0–5.0)
Cholesterol, Total: 126 mg/dL (ref 100–199)
HDL: 48 mg/dL (ref >39)
LDL Calculated: 57 mg/dL (ref 0–99)
Triglycerides: 105 mg/dL (ref 0–149)
VLDL Cholesterol Cal: 21 mg/dL (ref 5–40)

## 2017-06-12 LAB — CMP14+EGFR
ALBUMIN: 4.3 g/dL (ref 3.5–5.5)
ALT: 44 IU/L (ref 0–44)
AST: 27 IU/L (ref 0–40)
Albumin/Globulin Ratio: 1.5 (ref 1.2–2.2)
Alkaline Phosphatase: 98 IU/L (ref 39–117)
BILIRUBIN TOTAL: 0.6 mg/dL (ref 0.0–1.2)
BUN / CREAT RATIO: 14 (ref 9–20)
BUN: 13 mg/dL (ref 6–24)
CALCIUM: 9.1 mg/dL (ref 8.7–10.2)
CHLORIDE: 105 mmol/L (ref 96–106)
CO2: 21 mmol/L (ref 20–29)
CREATININE: 0.91 mg/dL (ref 0.76–1.27)
GFR calc non Af Amer: 97 mL/min/{1.73_m2} (ref >59)
GFR, EST AFRICAN AMERICAN: 112 mL/min/{1.73_m2} (ref >59)
GLUCOSE: 112 mg/dL — AB (ref 65–99)
Globulin, Total: 2.9 g/dL (ref 1.5–4.5)
Potassium: 3.8 mmol/L (ref 3.5–5.2)
Sodium: 141 mmol/L (ref 134–144)
TOTAL PROTEIN: 7.2 g/dL (ref 6.0–8.5)

## 2017-06-12 LAB — MICROALBUMIN / CREATININE URINE RATIO
CREATININE, UR: 252.5 mg/dL
Microalb/Creat Ratio: 23.4 mg/g creat (ref 0.0–30.0)
Microalbumin, Urine: 59.2 ug/mL

## 2017-06-16 ENCOUNTER — Other Ambulatory Visit: Payer: Self-pay | Admitting: Family Medicine

## 2017-06-16 DIAGNOSIS — K047 Periapical abscess without sinus: Secondary | ICD-10-CM

## 2017-06-17 ENCOUNTER — Telehealth: Payer: Self-pay

## 2017-06-17 NOTE — Telephone Encounter (Signed)
Patient was called and informed of lab results. 

## 2017-06-17 NOTE — Telephone Encounter (Signed)
Patient called and requested for listed medications to be refilled and sent to [facility] acetaminophen-codeine (TYLENOL #3) 300-30 MG tablet [[id number]]

## 2017-06-21 ENCOUNTER — Other Ambulatory Visit: Payer: Self-pay

## 2017-06-21 ENCOUNTER — Encounter (HOSPITAL_COMMUNITY): Payer: Self-pay | Admitting: Emergency Medicine

## 2017-06-21 ENCOUNTER — Emergency Department (HOSPITAL_COMMUNITY)
Admission: EM | Admit: 2017-06-21 | Discharge: 2017-06-21 | Disposition: A | Discharge status: Home / Self Care | Payer: Self-pay | Attending: Emergency Medicine | Admitting: Emergency Medicine

## 2017-06-21 ENCOUNTER — Emergency Department (HOSPITAL_COMMUNITY): Payer: Self-pay

## 2017-06-21 DIAGNOSIS — F17210 Nicotine dependence, cigarettes, uncomplicated: Secondary | ICD-10-CM | POA: Insufficient documentation

## 2017-06-21 DIAGNOSIS — I10 Essential (primary) hypertension: Secondary | ICD-10-CM | POA: Insufficient documentation

## 2017-06-21 DIAGNOSIS — E119 Type 2 diabetes mellitus without complications: Secondary | ICD-10-CM | POA: Insufficient documentation

## 2017-06-21 DIAGNOSIS — Z79899 Other long term (current) drug therapy: Secondary | ICD-10-CM | POA: Insufficient documentation

## 2017-06-21 DIAGNOSIS — R079 Chest pain, unspecified: Secondary | ICD-10-CM | POA: Insufficient documentation

## 2017-06-21 LAB — CBC WITH DIFFERENTIAL/PLATELET
ABS IMMATURE GRANULOCYTES: 0 10*3/uL (ref 0.0–0.1)
BASOS ABS: 0 10*3/uL (ref 0.0–0.1)
BASOS PCT: 0 %
EOS ABS: 0.1 10*3/uL (ref 0.0–0.7)
Eosinophils Relative: 2 %
HCT: 39.5 % (ref 39.0–52.0)
Hemoglobin: 12.8 g/dL — ABNORMAL LOW (ref 13.0–17.0)
IMMATURE GRANULOCYTES: 0 %
Lymphocytes Relative: 59 %
Lymphs Abs: 4.3 10*3/uL — ABNORMAL HIGH (ref 0.7–4.0)
MCH: 27.6 pg (ref 26.0–34.0)
MCHC: 32.4 g/dL (ref 30.0–36.0)
MCV: 85.3 fL (ref 78.0–100.0)
Monocytes Absolute: 0.4 10*3/uL (ref 0.1–1.0)
Monocytes Relative: 5 %
NEUTROS ABS: 2.5 10*3/uL (ref 1.7–7.7)
NEUTROS PCT: 34 %
PLATELETS: 272 10*3/uL (ref 150–400)
RBC: 4.63 MIL/uL (ref 4.22–5.81)
RDW: 15 % (ref 11.5–15.5)
WBC: 7.3 10*3/uL (ref 4.0–10.5)

## 2017-06-21 LAB — BRAIN NATRIURETIC PEPTIDE: B Natriuretic Peptide: 71.7 pg/mL (ref 0.0–100.0)

## 2017-06-21 LAB — COMPREHENSIVE METABOLIC PANEL
ALT: 60 U/L (ref 17–63)
AST: 37 U/L (ref 15–41)
Albumin: 3.8 g/dL (ref 3.5–5.0)
Alkaline Phosphatase: 102 U/L (ref 38–126)
Anion gap: 12 (ref 5–15)
BUN: 13 mg/dL (ref 6–20)
CHLORIDE: 104 mmol/L (ref 101–111)
CO2: 23 mmol/L (ref 22–32)
Calcium: 8.5 mg/dL — ABNORMAL LOW (ref 8.9–10.3)
Creatinine, Ser: 0.99 mg/dL (ref 0.61–1.24)
Glucose, Bld: 128 mg/dL — ABNORMAL HIGH (ref 65–99)
POTASSIUM: 3.4 mmol/L — AB (ref 3.5–5.1)
SODIUM: 139 mmol/L (ref 135–145)
Total Bilirubin: 0.8 mg/dL (ref 0.3–1.2)
Total Protein: 7 g/dL (ref 6.5–8.1)

## 2017-06-21 LAB — LIPASE, BLOOD: Lipase: 20 U/L (ref 11–51)

## 2017-06-21 LAB — I-STAT TROPONIN, ED
TROPONIN I, POC: 0.01 ng/mL (ref 0.00–0.08)
Troponin i, poc: 0.01 ng/mL (ref 0.00–0.08)

## 2017-06-21 MED ORDER — ONDANSETRON 4 MG PO TBDP
8.0000 mg | ORAL_TABLET | Freq: Once | ORAL | Status: AC
  Administered 2017-06-21: 8 mg via ORAL
  Filled 2017-06-21: qty 2

## 2017-06-21 MED ORDER — MORPHINE SULFATE (PF) 4 MG/ML IV SOLN
4.0000 mg | Freq: Once | INTRAVENOUS | Status: AC
  Administered 2017-06-21: 4 mg via INTRAVENOUS
  Filled 2017-06-21: qty 1

## 2017-06-21 MED ORDER — IOPAMIDOL (ISOVUE-370) INJECTION 76%
100.0000 mL | Freq: Once | INTRAVENOUS | Status: AC
  Administered 2017-06-21: 100 mL via INTRAVENOUS

## 2017-06-21 MED ORDER — KETOROLAC TROMETHAMINE 30 MG/ML IJ SOLN
15.0000 mg | Freq: Once | INTRAMUSCULAR | Status: AC
  Administered 2017-06-21: 15 mg via INTRAVENOUS
  Filled 2017-06-21: qty 1

## 2017-06-21 NOTE — Discharge Instructions (Addendum)
The CT of your chest does not show any concerning findings. You do not have a blood clot in your lungs. Your lab work is unremarkable. At this point, I feel you are safe for discharge but want you to follow-up with your PCP to discuss stress testing.

## 2017-06-21 NOTE — ED Triage Notes (Addendum)
Patient is from work, started having chest pain approx 3am.  Chest pain is a burning, throbbing, in the center of the chest.  No radiation.  No provocation, intermittent in the last month, on exertion.  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  Patient has had some nausea, no shortness of breath.  He was diaphoretic upon EMS arrival, he had just stopped sweeping.  Mild edema to bilat legs.   Given 2 sl nitro and 324mg  ASA.

## 2017-06-21 NOTE — ED Provider Notes (Signed)
[facility] EMERGENCY DEPARTMENT Provider Note   CSN: [id number] Arrival date & time: 06/21/17  0419     History   Chief Complaint Chief Complaint  Patient presents with  . Chest Pain    HPI Ilyaas Musto is a 52 y.o. male.  Patient presents to the emergency department for evaluation of chest pain.  Patient has a pain in the center of his chest that does not radiate.  No associated shortness of breath, nausea, diaphoresis.  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  He has had this pain before, however, and he can happen at rest.  He reports that it will come and go.  Pain comes on lasts for a short time and then resolves, comes back.  After the pain resolves he has a burning sensation.      Past Medical History:  Diagnosis Date  . Bronchitis   . Bronchitis   . Gout   . Hammertoe of second toe of right foot   . Smoker     Patient Active Problem List   Diagnosis Date Noted  . Hypertension 11/19/2016  . Type 2 diabetes mellitus ([place]) 08/19/2016  . Hyperlipidemia 08/19/2016  . Back pain 05/13/2016  . Hammertoe of second toe of right foot 03/06/2016  . Onychomycosis 04/16/2015  . 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 Gout 03/28/2015    Past Surgical History:  Procedure Laterality Date  . HAMMER TOE SURGERY Right 03/10/2016   Procedure: 2ND RIGHT HAMMER TOE CORRECTION;  Surgeon: Landis Martins, DPM;  Location: [facility];  Service: Podiatry;  Laterality: Right;  . MASS EXCISION Right 03/10/2016   Procedure: EXCISION OF CALLUS 2ND RIGHT TOE;  Surgeon: Landis Martins, DPM;  Location: [facility];  Service: Podiatry;  Laterality: Right;  . NO PAST SURGERIES          Home Medications    Prior to Admission medications   Medication Sig Start Date End Date Taking? Authorizing Provider  acetaminophen-codeine (TYLENOL #3) 300-30 MG tablet Take 1 tablet by mouth at bedtime as needed for moderate pain. 06/01/17   Yes [name], MD  allopurinol (ZYLOPRIM) 300 MG tablet Take 1 tablet (300 mg total) by mouth 2 (two) times daily. 06/01/17  Yes [name], MD  atorvastatin (LIPITOR) 20 MG tablet Take 1 tablet (20 mg total) by mouth daily. 06/01/17  Yes [name], MD  lisinopril (PRINIVIL,ZESTRIL) 5 MG tablet Take 1 tablet (5 mg total) by mouth daily. 06/01/17  Yes [name], MD  tadalafil (CIALIS) 10 MG tablet Take 1 tablet (10 mg total) by mouth every other day as needed for erectile dysfunction. 06/01/17  Yes [name], MD  acetaminophen-codeine (TYLENOL #3) 300-30 MG tablet Take 1 tablet by mouth every 8 (eight) hours as needed for moderate pain. Patient not taking: Reported on 06/21/2017 02/17/17   Landis Martins, DPM  ciclopirox (PENLAC) 8 % solution Apply topically at bedtime. Apply over nail and surrounding skin. Apply daily after filing nail Patient not taking: Reported on 06/21/2017 10/07/16   Landis Martins, DPM  clindamycin (CLEOCIN) 300 MG capsule Take 1 capsule (300 mg total) by mouth 2 (two) times daily. 06/01/17   [name], MD  Colchicine (MITIGARE) 0.6 MG CAPS Take 2 capsule by mouth for gout flare. May repeat 1 capsule in 2 hours if needed. Patient not taking: Reported on 06/21/2017 06/01/17   [name], MD  cyclobenzaprine (FLEXERIL) 10 MG tablet Take 1 tablet (10 mg  total) by mouth 2 (two) times daily as needed for muscle spasms. Patient not taking: Reported on 06/21/2017 02/16/17   [name], MD  gabapentin (NEURONTIN) 250 MG/5ML solution Take 5 mLs (250 mg total) by mouth 2 (two) times daily. Patient not taking: Reported on 06/21/2017 03/04/17   [name], MD  indomethacin (INDOCIN) 50 MG capsule Take 1 capsule (50 mg total) by mouth 2 (two) times daily with a meal. Patient not taking: Reported on 06/21/2017 03/24/17   Landis Martins, DPM  meloxicam (MOBIC) 7.5 MG tablet Take 1 tablet (7.5 mg total) by mouth daily. Patient not taking: Reported on  06/21/2017 02/16/17   [name], MD  terbinafine (LAMISIL) 250 MG tablet Take 1 tablet (250 mg total) by mouth daily. Patient not taking: Reported on 06/21/2017 05/13/16   Landis Martins, DPM  terbinafine (LAMISIL) 250 MG tablet Take 1 tablet (250 mg total) by mouth daily. Patient not taking: Reported on 06/21/2017 02/18/17   Landis Martins, DPM    Family History No family history on file.  Social History Social History   Tobacco Use  . Smoking status: Current Every Day Smoker    Packs/day: 0.25    Types: Cigarettes  . Smokeless tobacco: Never Used  . Tobacco comment: 8 cigs daily  Substance Use Topics  . Alcohol use: No  . Drug use: No     Allergies   Vicodin [hydrocodone-acetaminophen]   Review of Systems Review of Systems   Physical Exam Updated Vital Signs BP (!) 146/104   Pulse 85   Temp 97.9 F (36.6 C) (Oral)   Resp 20   SpO2 99%   Physical Exam  Constitutional: He is oriented to person, place, and time. He appears well-developed and well-nourished. No distress.  HENT:  Head: Normocephalic and atraumatic.  Right Ear: Hearing normal.  Left Ear: Hearing normal.  Nose: Nose normal.  Mouth/Throat: Oropharynx is clear and moist and mucous membranes are normal.  Eyes: Pupils are equal, round, and reactive to light. Conjunctivae and EOM are normal.  Neck: Normal range of motion. Neck supple.  Cardiovascular: Regular rhythm, S1 normal and S2 normal. Exam reveals no gallop and no friction rub.  No murmur heard. Pulmonary/Chest: Effort normal and breath sounds normal. No respiratory distress. He exhibits tenderness.    Abdominal: Soft. Normal appearance and bowel sounds are normal. There is no hepatosplenomegaly. There is no rebound, no guarding, no tenderness at McBurney's point and negative Murphy's sign. No hernia.  Musculoskeletal: Normal range of motion.  Neurological: He is alert and oriented to person, place, and time. He has normal strength. No  cranial nerve deficit or sensory deficit. Coordination normal. GCS eye subscore is 4. GCS verbal subscore is 5. GCS motor subscore is 6.  Skin: Skin is warm, dry and intact. No rash noted. No cyanosis.  Psychiatric: He has a normal mood and affect. His speech is normal and behavior is normal. Thought content normal.  Nursing note and vitals reviewed.    ED Treatments / Results  Labs (all labs ordered are listed, but only abnormal results are displayed) Labs Reviewed  CBC WITH DIFFERENTIAL/PLATELET - Abnormal; Notable for the following components:      Result Value   Hemoglobin 12.8 (*)    Lymphs Abs 4.3 (*)    All other components within normal limits  COMPREHENSIVE METABOLIC PANEL - Abnormal; Notable for the following components:   Potassium 3.4 (*)    Glucose, Bld 128 (*)    Calcium 8.5 (*)  All other components within normal limits  LIPASE, BLOOD  BRAIN NATRIURETIC PEPTIDE  I-STAT TROPONIN, ED    EKG EKG Interpretation  Date/Time:  Sunday Jun 21 2017 04:27:01 EDT Ventricular Rate:  88 PR Interval:    QRS Duration: 116 QT Interval:  402 QTC Calculation: 487 R Axis:   37 Text Interpretation:  Sinus rhythm Normal ECG No significant change since last tracing Reconfirmed by Orpah Greek [phone]) on 06/21/2017 7:13:02 AM   Radiology Dg Chest 2 View  Result Date: 06/21/2017 CLINICAL DATA:  Acute onset of central chest burning. Diaphoresis and nausea. EXAM: CHEST - 2 VIEW COMPARISON:  Chest radiograph performed 11/21/2014 FINDINGS: The lungs are well-aerated. Peribronchial thickening is noted. There is no evidence of focal opacification, pleural effusion or pneumothorax. The heart is normal in size; the mediastinal contour is within normal limits. No acute osseous abnormalities are seen. IMPRESSION: Peribronchial thickening noted; lungs otherwise clear. Electronically Signed   By: Garald Balding M.D.   On: 06/21/2017 06:07    Procedures Procedures (including  critical care time)  Medications Ordered in ED Medications  morphine 4 MG/ML injection 4 mg (4 mg Intravenous Given 06/21/17 0504)  ondansetron (ZOFRAN-ODT) disintegrating tablet 8 mg (8 mg Oral Given 06/21/17 0504)  ketorolac (TORADOL) 30 MG/ML injection 15 mg (15 mg Intravenous Given 06/21/17 0742)  iopamidol (ISOVUE-370) 76 % injection 100 mL (100 mLs Intravenous Contrast Given 06/21/17 0751)     Initial Impression / Assessment and Plan / ED Course  I have reviewed the triage vital signs and the nursing notes.  Pertinent labs & imaging results that were available during my care of the patient were reviewed by me and considered in my medical decision making (see chart for details).     Patient presents to the emergency department for evaluation of chest pain.  Pain is atypical.  It started when he was sweeping, but appears to be more related to movement and exertion.  Anterior chest wall is tender to the touch, this reproduces the pain that he has.  He reports that he has had this happen frequently in the past.  He also can happen at rest without any movement or exertion.  EKG does not show any changes to suggest ischemia or infarct.  He was administered aspirin and nitroglycerin by EMS without any change in his pain.  Patient is a heart score of 3.  He is therefore in low risk category.  Will obtain a second troponin.  As he is still having pain, unchanged with nitroglycerin, improved with morphine, will obtain CT angiography of chest to rule out PE and aortic dissection.  Will sign out to oncoming ER physician to follow-up delta troponin and CT scan.  Final Clinical Impressions(s) / ED Diagnoses   Final diagnoses:  Nonspecific chest pain    ED Discharge Orders    None       Orpah Greek, MD 06/21/17 [phone]

## 2017-07-02 MED FILL — ALLOPURINOL 300 MG TABLET: 300 | 30 days supply | Qty: 60 | Fill #0

## 2017-07-02 MED FILL — !COLCRYS 0.6 MG TABLET: 0.6 MG | 30 days supply | Qty: 20 | Fill #0

## 2017-07-02 MED FILL — ATORVASTATIN 20 MG TABLET: 20 | 30 days supply | Qty: 30 | Fill #1

## 2017-07-12 ENCOUNTER — Emergency Department (HOSPITAL_COMMUNITY): Payer: Self-pay

## 2017-07-12 ENCOUNTER — Encounter (HOSPITAL_COMMUNITY): Payer: Self-pay | Admitting: Emergency Medicine

## 2017-07-12 ENCOUNTER — Emergency Department (HOSPITAL_COMMUNITY)
Admission: EM | Admit: 2017-07-12 | Discharge: 2017-07-12 | Disposition: A | Discharge status: Home / Self Care | Payer: Self-pay | Attending: Emergency Medicine | Admitting: Emergency Medicine

## 2017-07-12 DIAGNOSIS — Z79899 Other long term (current) drug therapy: Secondary | ICD-10-CM | POA: Insufficient documentation

## 2017-07-12 DIAGNOSIS — R0789 Other chest pain: Secondary | ICD-10-CM | POA: Insufficient documentation

## 2017-07-12 DIAGNOSIS — E119 Type 2 diabetes mellitus without complications: Secondary | ICD-10-CM | POA: Insufficient documentation

## 2017-07-12 DIAGNOSIS — Z7984 Long term (current) use of oral hypoglycemic drugs: Secondary | ICD-10-CM | POA: Insufficient documentation

## 2017-07-12 DIAGNOSIS — M79672 Pain in left foot: Secondary | ICD-10-CM | POA: Insufficient documentation

## 2017-07-12 DIAGNOSIS — F17210 Nicotine dependence, cigarettes, uncomplicated: Secondary | ICD-10-CM | POA: Insufficient documentation

## 2017-07-12 DIAGNOSIS — I10 Essential (primary) hypertension: Secondary | ICD-10-CM | POA: Insufficient documentation

## 2017-07-12 DIAGNOSIS — M79671 Pain in right foot: Secondary | ICD-10-CM | POA: Insufficient documentation

## 2017-07-12 LAB — I-STAT TROPONIN, ED: Troponin i, poc: 0 ng/mL (ref 0.00–0.08)

## 2017-07-12 LAB — BASIC METABOLIC PANEL
Anion gap: 9 (ref 5–15)
BUN: 12 mg/dL (ref 6–20)
CALCIUM: 8.7 mg/dL — AB (ref 8.9–10.3)
CO2: 22 mmol/L (ref 22–32)
CREATININE: 0.84 mg/dL (ref 0.61–1.24)
Chloride: 110 mmol/L (ref 101–111)
GFR calc Af Amer: >60 mL/min (ref >60)
GFR calc non Af Amer: >60 mL/min (ref >60)
GLUCOSE: 111 mg/dL — AB (ref 65–99)
Potassium: 3.7 mmol/L (ref 3.5–5.1)
Sodium: 141 mmol/L (ref 135–145)

## 2017-07-12 LAB — CBC
HCT: 39.4 % (ref 39.0–52.0)
HEMOGLOBIN: 12.6 g/dL — AB (ref 13.0–17.0)
MCH: 27.9 pg (ref 26.0–34.0)
MCHC: 32 g/dL (ref 30.0–36.0)
MCV: 87.2 fL (ref 78.0–100.0)
PLATELETS: 256 10*3/uL (ref 150–400)
RBC: 4.52 MIL/uL (ref 4.22–5.81)
RDW: 14.7 % (ref 11.5–15.5)
WBC: 7.4 10*3/uL (ref 4.0–10.5)

## 2017-07-12 MED ORDER — KETOROLAC TROMETHAMINE 30 MG/ML IJ SOLN
30.0000 mg | Freq: Once | INTRAMUSCULAR | Status: AC
  Administered 2017-07-12: 30 mg via INTRAMUSCULAR
  Filled 2017-07-12: qty 1

## 2017-07-12 MED ORDER — NAPROXEN 500 MG PO TABS: 500.0000 mg | ORAL_TABLET | Freq: Two times a day (BID) | ORAL | 0 refills | Status: DC

## 2017-07-12 NOTE — Discharge Instructions (Addendum)
You were seen today for chest pain and foot pain.  Your work-up is reassuring.  Follow-up with cardiology for formal stress testing.  Regarding her foot pain, this is not consistent with gout.  It may be related to heel spurs.  Make sure that you are wearing good fitting shoes.  Take naproxen as needed for pain.

## 2017-07-12 NOTE — ED Notes (Signed)
Pt left at this time with all belongings.  

## 2017-07-12 NOTE — ED Provider Notes (Signed)
[facility] EMER[facility]ENCY DEPARTMENT Provider Note   CSN: [id number] Arrival date & time: 07/12/17  0128     History   Chief Complaint Chief Complaint  Patient presents with  . Chest Pain  . Gout    HPI Terry Poole is a 52 y.o. male.  HPI  This is a 52 year old male with a history of gout, diabetes, hypertension, hyperlipidemia who presents with foot pain and chest pain.  Patient reports 2 to 3-day history of anterior burning, nonradiating chest pain.  He states that it comes and goes.  Sometimes worse with movement.  Not worse with exertion.  Denies any shortness of breath, sweating, fevers, cough.  He has had pain like this in the past.  He has not taken anything for his pain.  Additionally, patient reports ongoing bilateral foot and heel pain.  He states that it is not any better or worse depending on shoes that he wears.  Pain does get worse throughout the day.  He attributes this to his gout and states that allopurinol is not helping.  He is not taking any acute pain control including anti-inflammatories.  Denies any fevers.  Past Medical History:  Diagnosis Date  . Bronchitis   . Bronchitis   . Gout   . Hammertoe of second toe of right foot   . Smoker     Patient Active Problem List   Diagnosis Date Noted  . Hypertension 11/19/2016  . Type 2 diabetes mellitus ([place]) 08/19/2016  . Hyperlipidemia 08/19/2016  . Back pain 05/13/2016  . Hammertoe of second toe of right foot 03/06/2016  . Onychomycosis 04/16/2015  . 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 Gout 03/28/2015    Past Surgical History:  Procedure Laterality Date  . HAMMER TOE SURGERY Right 03/10/2016   Procedure: 2ND RIGHT HAMMER TOE CORRECTION;  Surgeon: Landis Martins, DPM;  Location: [facility];  Service: Podiatry;  Laterality: Right;  . MASS EXCISION Right 03/10/2016   Procedure: EXCISION OF CALLUS 2ND RIGHT TOE;  Surgeon: Landis Martins, DPM;  Location: [facility];  Service: Podiatry;  Laterality: Right;  . NO PAST SURGERIES          Home Medications    Prior to Admission medications   Medication Sig Start Date End Date Taking? Authorizing Provider  acetaminophen-codeine (TYLENOL #3) 300-30 MG tablet Take 1 tablet by mouth every 8 (eight) hours as needed for moderate pain. Patient not taking: Reported on 06/21/2017 02/17/17   Landis Martins, DPM  acetaminophen-codeine (TYLENOL #3) 300-30 MG tablet Take 1 tablet by mouth at bedtime as needed for moderate pain. 06/01/17   [name], MD  allopurinol (ZYLOPRIM) 300 MG tablet Take 1 tablet (300 mg total) by mouth 2 (two) times daily. 06/01/17   [name], MD  atorvastatin (LIPITOR) 20 MG tablet Take 1 tablet (20 mg total) by mouth daily. 06/01/17   [name], MD  ciclopirox (PENLAC) 8 % solution Apply topically at bedtime. Apply over nail and surrounding skin. Apply daily after filing nail Patient not taking: Reported on 06/21/2017 10/07/16   Landis Martins, DPM  clindamycin (CLEOCIN) 300 MG capsule Take 1 capsule (300 mg total) by mouth 2 (two) times daily. 06/01/17   [name], MD  Colchicine (MITIGARE) 0.6 MG CAPS Take 2 capsule by mouth for gout flare. May repeat 1 capsule in 2 hours if needed. Patient not taking: Reported on 06/21/2017 06/01/17   [name], MD  cyclobenzaprine (FLEXERIL) 10 MG tablet Take 1  tablet (10 mg total) by mouth 2 (two) times daily as needed for muscle spasms. Patient not taking: Reported on 06/21/2017 02/16/17   [name], MD  gabapentin (NEURONTIN) 250 MG/5ML solution Take 5 mLs (250 mg total) by mouth 2 (two) times daily. Patient not taking: Reported on 06/21/2017 03/04/17   [name], MD  indomethacin (INDOCIN) 50 MG capsule Take 1 capsule (50 mg total) by mouth 2 (two) times daily with a meal. Patient not taking: Reported on 06/21/2017 03/24/17   Landis Martins, DPM  lisinopril (PRINIVIL,ZESTRIL) 5 MG tablet Take 1 tablet (5 mg total)  by mouth daily. 06/01/17   [name], MD  meloxicam (MOBIC) 7.5 MG tablet Take 1 tablet (7.5 mg total) by mouth daily. Patient not taking: Reported on 06/21/2017 02/16/17   [name], MD  naproxen (NAPROSYN) 500 MG tablet Take 1 tablet (500 mg total) by mouth 2 (two) times daily. 07/12/17   Horton, Barbette Hair, MD  tadalafil (CIALIS) 10 MG tablet Take 1 tablet (10 mg total) by mouth every other day as needed for erectile dysfunction. 06/01/17   [name], MD  terbinafine (LAMISIL) 250 MG tablet Take 1 tablet (250 mg total) by mouth daily. Patient not taking: Reported on 06/21/2017 05/13/16   Landis Martins, DPM  terbinafine (LAMISIL) 250 MG tablet Take 1 tablet (250 mg total) by mouth daily. Patient not taking: Reported on 06/21/2017 02/18/17   Landis Martins, DPM    Family History No family history on file.  Social History Social History   Tobacco Use  . Smoking status: Current Every Day Smoker    Packs/day: 0.25    Types: Cigarettes  . Smokeless tobacco: Never Used  . Tobacco comment: 8 cigs daily  Substance Use Topics  . Alcohol use: No  . Drug use: No     Allergies   Vicodin [hydrocodone-acetaminophen]   Review of Systems Review of Systems  Constitutional: Negative for fever.  Respiratory: Negative for cough and shortness of breath.   Cardiovascular: Positive for chest pain.  Gastrointestinal: Negative for abdominal pain, nausea and vomiting.  Musculoskeletal:       Bilateral foot pain  Skin: Negative for color change and wound.  All other systems reviewed and are negative.    Physical Exam Updated Vital Signs BP (!) 159/104   Pulse 81   Temp (!) 97.5 F (36.4 C) (Oral)   Resp 18   Ht 5\' 9"  (1.753 m)   Wt 102.1 kg (225 lb)   SpO2 98%   BMI 33.23 kg/m   Physical Exam  Constitutional: He is oriented to person, place, and time. He appears well-developed and well-nourished. No distress.  HENT:  Head: Normocephalic and atraumatic.  Neck: Neck  supple.  Cardiovascular: Normal rate, regular rhythm, normal heart sounds and normal pulses.  No murmur heard. Tenderness to palpation anterior chest, no crepitus, no overlying skin changes  Pulmonary/Chest: Effort normal and breath sounds normal. No respiratory distress. He has no wheezes.  Abdominal: Soft. Bowel sounds are normal. There is no tenderness. There is no rebound.  Musculoskeletal: Normal range of motion. He exhibits no edema.       Right lower leg: He exhibits no edema.       Left lower leg: He exhibits no edema.  Tenderness to palpation bilateral heels, no obvious deformity, no redness, swelling, erythema, normal range of motion, 2+ DP pulse, no overlying skin changes  Lymphadenopathy:    He has no cervical adenopathy.  Neurological: He is alert  and oriented to person, place, and time.  Skin: Skin is warm and dry.  Psychiatric: He has a normal mood and affect.  Nursing note and vitals reviewed.    ED Treatments / Results  Labs (all labs ordered are listed, but only abnormal results are displayed) Labs Reviewed  BASIC METABOLIC PANEL - Abnormal; Notable for the following components:      Result Value   Glucose, Bld 111 (*)    Calcium 8.7 (*)    All other components within normal limits  CBC - Abnormal; Notable for the following components:   Hemoglobin 12.6 (*)    All other components within normal limits  I-STAT TROPONIN, ED    EKG EKG Interpretation  Date/Time:  Sunday July 12 2017 01:37:31 EDT Ventricular Rate:  86 PR Interval:  148 QRS Duration: 88 QT Interval:  396 QTC Calculation: 473 R Axis:   55 Text Interpretation:  Normal sinus rhythm Normal ECG No significant change since last tracing Confirmed by Wickline, Donald ([id number]) on 07/12/2017 1:39:13 AM   Radiology Dg Chest 2 View  Result Date: 07/12/2017 CLINICAL DATA:  Central chest pain 2-3 days. EXAM: CHEST - 2 VIEW COMPARISON:  06/21/2017 FINDINGS: Lungs are adequately inflated without focal  airspace consolidation or effusion. Cardiomediastinal silhouette and remainder of the exam is unchanged. IMPRESSION: No active cardiopulmonary disease. Electronically Signed   By: Daniel  Boyle M.D.   On: 07/12/2017 02:25    Procedures Procedures (including critical care time)  Medications Ordered in ED Medications  ketorolac (TORADOL) 30 MG/ML injection 30 mg (30 mg Intramuscular Given 07/12/17 0503)     Initial Impression / Assessment and Plan / ED Course  I have reviewed the triage vital signs and the nursing notes.  Pertinent labs & imaging results that were available during my care of the patient were reviewed by me and considered in my medical decision making (see chart for details).     Patient presents with 2 separate complaints.  Reports chest pain and bilateral foot pain.  He is overall nontoxic-appearing and vital signs notable for elevated blood pressure 159/104.  Regarding his chest pain, he was seen and evaluated in mid May.  Per chart review at that time he had serial troponins as well as a CT scan of the chest which was negative for PE or dissection.  Pain is reproducible on exam.  Heart score is 3.  EKG shows no signs of ischemia and troponin is negative.  Doubt ACS.  Doubt PE.  Given reproducible nature, question musculoskeletal etiology.  Patient attributes foot pain to gout; however, it is not classic and not within the joint.  It is more over the heel.  Could be related to heel spurs.  Given history of diabetes, could also be neuropathy.  Creatinine is preserved.  Will trial on scheduled anti-inflammatories with primary care follow-up.  After history, exam, and medical workup I feel the patient has been appropriately medically screened and is safe for discharge home. Pertinent diagnoses were discussed with the patient. Patient was given return precautions.   Final Clinical Impressions(s) / ED Diagnoses   Final diagnoses:  Atypical chest pain  Foot pain, bilateral     ED Discharge Orders        Ordered    naproxen (NAPROSYN) 500 MG tablet  2 times daily     06 /09/19 0529       Merryl Hacker, MD 07/12/17 [phone]

## 2017-07-12 NOTE — ED Triage Notes (Signed)
Reports continued pain in feet from gout even with medications and also endorses central chest pain for the last 2-3 days that comes and goes.

## 2017-07-21 ENCOUNTER — Encounter: Payer: Self-pay | Admitting: Family Medicine

## 2017-07-21 ENCOUNTER — Ambulatory Visit: Payer: Self-pay | Attending: Family Medicine | Admitting: Family Medicine

## 2017-07-21 VITALS — BP 122/78 | HR 82 | Temp 97.8°F | Ht 69.0 in | Wt 218.8 lb

## 2017-07-21 DIAGNOSIS — M109 Gout, unspecified: Secondary | ICD-10-CM | POA: Insufficient documentation

## 2017-07-21 DIAGNOSIS — N528 Other male erectile dysfunction: Secondary | ICD-10-CM | POA: Insufficient documentation

## 2017-07-21 DIAGNOSIS — E119 Type 2 diabetes mellitus without complications: Secondary | ICD-10-CM | POA: Insufficient documentation

## 2017-07-21 DIAGNOSIS — E785 Hyperlipidemia, unspecified: Secondary | ICD-10-CM | POA: Insufficient documentation

## 2017-07-21 DIAGNOSIS — J019 Acute sinusitis, unspecified: Secondary | ICD-10-CM | POA: Insufficient documentation

## 2017-07-21 DIAGNOSIS — J018 Other acute sinusitis: Secondary | ICD-10-CM

## 2017-07-21 DIAGNOSIS — Z885 Allergy status to narcotic agent status: Secondary | ICD-10-CM | POA: Insufficient documentation

## 2017-07-21 DIAGNOSIS — Z79899 Other long term (current) drug therapy: Secondary | ICD-10-CM | POA: Insufficient documentation

## 2017-07-21 MED ORDER — AMOXICILLIN 500 MG PO CAPS: 500.0000 mg | ORAL_CAPSULE | Freq: Three times a day (TID) | ORAL | 0 refills | Status: DC

## 2017-07-21 MED ORDER — CETIRIZINE HCL 10 MG PO TABS: 10.0000 mg | ORAL_TABLET | Freq: Every day | ORAL | 1 refills | Status: DC

## 2017-07-21 MED ORDER — SILDENAFIL CITRATE 50 MG PO TABS: 50.0000 mg | ORAL_TABLET | Freq: Every day | ORAL | 1 refills | Status: DC | PRN

## 2017-07-21 MED ORDER — TADALAFIL 10 MG PO TABS: 10.0000 mg | ORAL_TABLET | ORAL | 1 refills | Status: DC | PRN

## 2017-07-21 MED FILL — AMOXICILLIN 500 MG CAPSULE: 500 | 10 days supply | Qty: 30 | Fill #0

## 2017-07-21 MED FILL — !VIAGRA 50 MG TABLET: 50 | 30 days supply | Qty: 10 | Fill #0

## 2017-07-21 NOTE — Patient Instructions (Signed)

## 2017-07-21 NOTE — Progress Notes (Signed)
Subjective:  Patient ID: Terry Poole, male    DOB: [birth year]  Age: 52 y.o. MRN: [id number]  CC: Sinusitis   HPI Antoni Stefan is a 52 year old male with a history of type 2 diabetes mellitus (A1c 6.4, diet controlled) Hyperlipidemia, Gout who presents today for an acute visit complaining of a 4-day history of ethmoidal sinus pressure and pain, cough productive of yellowish mucus which has not responded to the use of OTC decongestants and nasal sprays.  He denies fevers but has had some myalgias.  Denies shortness of breath, wheezing, headaches, dizziness. Does have a history of sick contacts in one of his grandchildren. He is also requesting a refill of Cialis for his erectile dysfunction.  Past Medical History:  Diagnosis Date  . Bronchitis   . Bronchitis   . Gout   . Hammertoe of second toe of right foot   . Smoker     Past Surgical History:  Procedure Laterality Date  . HAMMER TOE SURGERY Right 03/10/2016   Procedure: 2ND RIGHT HAMMER TOE CORRECTION;  Surgeon: Landis Martins, DPM;  Location: [facility];  Service: Podiatry;  Laterality: Right;  . MASS EXCISION Right 03/10/2016   Procedure: EXCISION OF CALLUS 2ND RIGHT TOE;  Surgeon: Landis Martins, DPM;  Location: [facility];  Service: Podiatry;  Laterality: Right;  . NO PAST SURGERIES      Allergies  Allergen Reactions  . Vicodin [Hydrocodone-Acetaminophen] Hives     Outpatient Medications Prior to Visit  Medication Sig Dispense Refill  . acetaminophen-codeine (TYLENOL #3) 300-30 MG tablet Take 1 tablet by mouth every 8 (eight) hours as needed for moderate pain. (Patient not taking: Reported on 06/21/2017) 5 tablet 0  . acetaminophen-codeine (TYLENOL #3) 300-30 MG tablet Take 1 tablet by mouth at bedtime as needed for moderate pain. 10 tablet 0  . allopurinol (ZYLOPRIM) 300 MG tablet Take 1 tablet (300 mg total) by mouth 2 (two) times daily. 60 tablet 3  . atorvastatin (LIPITOR) 20 MG tablet Take  1 tablet (20 mg total) by mouth daily. 30 tablet 3  . ciclopirox (PENLAC) 8 % solution Apply topically at bedtime. Apply over nail and surrounding skin. Apply daily after filing nail (Patient not taking: Reported on 06/21/2017) 6.6 mL 2  . clindamycin (CLEOCIN) 300 MG capsule Take 1 capsule (300 mg total) by mouth 2 (two) times daily. 20 capsule 0  . Colchicine (MITIGARE) 0.6 MG CAPS Take 2 capsule by mouth for gout flare. May repeat 1 capsule in 2 hours if needed. (Patient not taking: Reported on 06/21/2017) 20 capsule 2  . cyclobenzaprine (FLEXERIL) 10 MG tablet Take 1 tablet (10 mg total) by mouth 2 (two) times daily as needed for muscle spasms. (Patient not taking: Reported on 06/21/2017) 60 tablet 3  . gabapentin (NEURONTIN) 250 MG/5ML solution Take 5 mLs (250 mg total) by mouth 2 (two) times daily. (Patient not taking: Reported on 06/21/2017) 300 mL 3  . indomethacin (INDOCIN) 50 MG capsule Take 1 capsule (50 mg total) by mouth 2 (two) times daily with a meal. (Patient not taking: Reported on 06/21/2017) 30 capsule 0  . lisinopril (PRINIVIL,ZESTRIL) 5 MG tablet Take 1 tablet (5 mg total) by mouth daily. 30 tablet 3  . meloxicam (MOBIC) 7.5 MG tablet Take 1 tablet (7.5 mg total) by mouth daily. (Patient not taking: Reported on 06/21/2017) 30 tablet 3  . naproxen (NAPROSYN) 500 MG tablet Take 1 tablet (500 mg total) by mouth 2 (two) times daily. [place]  tablet 0  . terbinafine (LAMISIL) 250 MG tablet Take 1 tablet (250 mg total) by mouth daily. (Patient not taking: Reported on 06/21/2017) 90 tablet 0  . terbinafine (LAMISIL) 250 MG tablet Take 1 tablet (250 mg total) by mouth daily. (Patient not taking: Reported on 06/21/2017) 30 tablet 0  . tadalafil (CIALIS) 10 MG tablet Take 1 tablet (10 mg total) by mouth every other day as needed for erectile dysfunction. 20 tablet 1   Facility-Administered Medications Prior to Visit  Medication Dose Route Frequency Provider Last Rate Last Dose  . dexamethasone  (DECADRON) injection 4 mg  4 mg Intra-articular Once [place] Kettle, Titorya, DPM      . triamcinolone acetonide (KENALOG) 10 MG/ML injection 10 mg  10 mg Other Once [place], Titorya, DPM      . triamcinolone acetonide (KENALOG) 10 MG/ML injection 10 mg  10 mg Other Once Landis Martins, DPM        ROS Review of Systems  Constitutional: Negative for activity change and appetite change.  HENT: Positive for sinus pressure and sinus pain. Negative for sore throat.   Eyes: Negative for visual disturbance.  Respiratory: Positive for cough. Negative for chest tightness and shortness of breath.   Cardiovascular: Negative for chest pain and leg swelling.  Gastrointestinal: Negative for abdominal distention, abdominal pain, constipation and diarrhea.  Endocrine: Negative.   Genitourinary: Negative for dysuria.  Musculoskeletal: Positive for myalgias. Negative for joint swelling.  Skin: Negative for rash.  Allergic/Immunologic: Negative.   Neurological: Negative for weakness, light-headedness and numbness.  Psychiatric/Behavioral: Negative for dysphoric mood and suicidal ideas.    Objective:  BP 122/78   Pulse 82   Temp 97.8 F (36.6 C) (Oral)   Ht 5\' 9"  (1.753 m)   Wt 218 lb 12.8 oz (99.2 kg)   SpO2 97%   BMI 32.31 kg/m   BP/Weight 07/21/2017 07/12/2017 6/38/7564  Systolic BP 332 951 884  Diastolic BP 78 166 063  Wt. (Lbs) 218.8 225 -  BMI 32.31 33.23 -      Physical Exam  Constitutional: He is oriented to person, place, and time. He appears well-developed and well-nourished.  HENT:  Ethmoidal sinus tenderness to palpation Normal oropharynx Normal TM bilaterally  Cardiovascular: Normal rate, normal heart sounds and intact distal pulses.  No murmur heard. Pulmonary/Chest: Effort normal and breath sounds normal. He has no wheezes. He has no rales. He exhibits no tenderness.  Abdominal: Soft. Bowel sounds are normal. He exhibits no distension and no mass. There is no tenderness.    Musculoskeletal: Normal range of motion.  Neurological: He is alert and oriented to person, place, and time.  Skin: Skin is warm and dry.  Psychiatric: He has a normal mood and affect.     Lab Results  Component Value Date   HGBA1C 6.4 05/04/2017    Assessment & Plan:   1. Acute non-recurrent sinusitis of other sinus - amoxicillin (AMOXIL) 500 MG capsule; Take 1 capsule (500 mg total) by mouth 3 (three) times daily.  Dispense: 30 capsule; Refill: 0 - cetirizine (ZYRTEC) 10 MG tablet; Take 1 tablet (10 mg total) by mouth daily.  Dispense: 30 tablet; Refill: 1  2. Other male erectile dysfunction - tadalafil (CIALIS) 10 MG tablet; Take 1 tablet (10 mg total) by mouth every other day as needed for erectile dysfunction.  Dispense: 20 tablet; Refill: 1   Meds ordered this encounter  Medications  . amoxicillin (AMOXIL) 500 MG capsule    Sig: Take 1 capsule (  500 mg total) by mouth 3 (three) times daily.    Dispense:  30 capsule    Refill:  0  . cetirizine (ZYRTEC) 10 MG tablet    Sig: Take 1 tablet (10 mg total) by mouth daily.    Dispense:  30 tablet    Refill:  1  . tadalafil (CIALIS) 10 MG tablet    Sig: Take 1 tablet (10 mg total) by mouth every other day as needed for erectile dysfunction.    Dispense:  20 tablet    Refill:  1    Follow-up: Return for follow up of chronic medical conditions, keep previously scheduled appointment.   [name] MD

## 2017-07-21 NOTE — Progress Notes (Signed)
Patient has been out of medication for 1 week. 

## 2017-08-02 ENCOUNTER — Encounter (HOSPITAL_COMMUNITY): Payer: Self-pay | Admitting: Emergency Medicine

## 2017-08-02 ENCOUNTER — Emergency Department (HOSPITAL_COMMUNITY)
Admission: EM | Admit: 2017-08-02 | Discharge: 2017-08-03 | Disposition: A | Discharge status: Home / Self Care | Payer: Self-pay | Attending: Emergency Medicine | Admitting: Emergency Medicine

## 2017-08-02 DIAGNOSIS — F17210 Nicotine dependence, cigarettes, uncomplicated: Secondary | ICD-10-CM | POA: Insufficient documentation

## 2017-08-02 DIAGNOSIS — Z79899 Other long term (current) drug therapy: Secondary | ICD-10-CM | POA: Insufficient documentation

## 2017-08-02 DIAGNOSIS — I10 Essential (primary) hypertension: Secondary | ICD-10-CM | POA: Insufficient documentation

## 2017-08-02 DIAGNOSIS — E119 Type 2 diabetes mellitus without complications: Secondary | ICD-10-CM | POA: Insufficient documentation

## 2017-08-02 DIAGNOSIS — M79671 Pain in right foot: Secondary | ICD-10-CM

## 2017-08-02 DIAGNOSIS — M79672 Pain in left foot: Secondary | ICD-10-CM

## 2017-08-02 NOTE — ED Triage Notes (Signed)
Pt c/o L foot pain with R foot soreness onset last night, pt reports he is out of gout meds.

## 2017-08-03 ENCOUNTER — Telehealth: Payer: Self-pay | Admitting: Sports Medicine

## 2017-08-03 MED ORDER — NAPROXEN 500 MG PO TABS: 500.0000 mg | ORAL_TABLET | Freq: Two times a day (BID) | ORAL | 0 refills | Status: DC

## 2017-08-03 MED ORDER — NAPROXEN 250 MG PO TABS
500.0000 mg | ORAL_TABLET | Freq: Once | ORAL | Status: AC
  Administered 2017-08-03: 500 mg via ORAL

## 2017-08-03 MED FILL — NAPROXEN 500 MG TABLET: 500 | 15 days supply | Qty: 30 | Fill #0

## 2017-08-03 NOTE — ED Provider Notes (Signed)
[facility] EMERGENCY DEPARTMENT Provider Note   CSN: [id number] Arrival date & time: 08/02/17  2323     History   Chief Complaint Chief Complaint  Patient presents with  . Gout    HPI Terry Poole is a 52 y.o. male with a hx of tobacco abuse, gout, HTN, hyperlipidemia and T2DM who presents to the ED with complaints of pain to bilateral feet that started last night. Patient describes the pain as being to the heals and the plantar surface of the foot. Pain is constant it was really bad this AM when he woke up and first started walking. Rates it a 10/10 in severity, no other specific alleviating/aggravating factors. No meds or home interventions prior to arrival. Patient attributes this to being related to gout. He denies injury or significant change in activity, he is on his feet a lot at work. Denies numbness, weakness, swelling, change in color, warmth, or fevers.  HPI  Past Medical History:  Diagnosis Date  . Bronchitis   . Bronchitis   . Gout   . Hammertoe of second toe of right foot   . Smoker     Patient Active Problem List   Diagnosis Date Noted  . Hypertension 11/19/2016  . Type 2 diabetes mellitus ([place]) 08/19/2016  . Hyperlipidemia 08/19/2016  . Back pain 05/13/2016  . Hammertoe of second toe of right foot 03/06/2016  . Onychomycosis 04/16/2015  . 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 Gout 03/28/2015    Past Surgical History:  Procedure Laterality Date  . HAMMER TOE SURGERY Right 03/10/2016   Procedure: 2ND RIGHT HAMMER TOE CORRECTION;  Surgeon: Landis Martins, DPM;  Location: [facility];  Service: Podiatry;  Laterality: Right;  . MASS EXCISION Right 03/10/2016   Procedure: EXCISION OF CALLUS 2ND RIGHT TOE;  Surgeon: Landis Martins, DPM;  Location: [facility];  Service: Podiatry;  Laterality: Right;  . NO PAST SURGERIES          Home Medications    Prior to Admission medications   Medication Sig Start Date  End Date Taking? Authorizing Provider  acetaminophen-codeine (TYLENOL #3) 300-30 MG tablet Take 1 tablet by mouth every 8 (eight) hours as needed for moderate pain. Patient not taking: Reported on 06/21/2017 02/17/17   Landis Martins, DPM  acetaminophen-codeine (TYLENOL #3) 300-30 MG tablet Take 1 tablet by mouth at bedtime as needed for moderate pain. 06/01/17   [name], MD  allopurinol (ZYLOPRIM) 300 MG tablet Take 1 tablet (300 mg total) by mouth 2 (two) times daily. 06/01/17   [name], MD  amoxicillin (AMOXIL) 500 MG capsule Take 1 capsule (500 mg total) by mouth 3 (three) times daily. 07/21/17   [name], MD  atorvastatin (LIPITOR) 20 MG tablet Take 1 tablet (20 mg total) by mouth daily. 06/01/17   [name], MD  cetirizine (ZYRTEC) 10 MG tablet Take 1 tablet (10 mg total) by mouth daily. 07/21/17   [name], MD  ciclopirox (PENLAC) 8 % solution Apply topically at bedtime. Apply over nail and surrounding skin. Apply daily after filing nail Patient not taking: Reported on 06/21/2017 10/07/16   Landis Martins, DPM  clindamycin (CLEOCIN) 300 MG capsule Take 1 capsule (300 mg total) by mouth 2 (two) times daily. 06/01/17   [name], MD  Colchicine (MITIGARE) 0.6 MG CAPS Take 2 capsule by mouth for gout flare. May repeat 1 capsule in 2 hours if needed. Patient not taking: Reported on 06/21/2017 06/01/17   Newlin,  Enobong, MD  cyclobenzaprine (FLEXERIL) 10 MG tablet Take 1 tablet (10 mg total) by mouth 2 (two) times daily as needed for muscle spasms. Patient not taking: Reported on 06/21/2017 02/16/17   [name], MD  gabapentin (NEURONTIN) 250 MG/5ML solution Take 5 mLs (250 mg total) by mouth 2 (two) times daily. Patient not taking: Reported on 06/21/2017 03/04/17   [name], MD  indomethacin (INDOCIN) 50 MG capsule Take 1 capsule (50 mg total) by mouth 2 (two) times daily with a meal. Patient not taking: Reported on 06/21/2017 03/24/17   Landis Martins, DPM   lisinopril (PRINIVIL,ZESTRIL) 5 MG tablet Take 1 tablet (5 mg total) by mouth daily. 06/01/17   [name], MD  meloxicam (MOBIC) 7.5 MG tablet Take 1 tablet (7.5 mg total) by mouth daily. Patient not taking: Reported on 06/21/2017 02/16/17   [name], MD  naproxen (NAPROSYN) 500 MG tablet Take 1 tablet (500 mg total) by mouth 2 (two) times daily. 07/12/17   Horton, Barbette Hair, MD  sildenafil (VIAGRA) 50 MG tablet Take 1 tablet (50 mg total) by mouth daily as needed for erectile dysfunction. At least 24 hours between doses 07/21/17   [name], MD  terbinafine (LAMISIL) 250 MG tablet Take 1 tablet (250 mg total) by mouth daily. Patient not taking: Reported on 06/21/2017 05/13/16   Landis Martins, DPM  terbinafine (LAMISIL) 250 MG tablet Take 1 tablet (250 mg total) by mouth daily. Patient not taking: Reported on 06/21/2017 02/18/17   Landis Martins, DPM    Family History No family history on file.  Social History Social History   Tobacco Use  . Smoking status: Current Every Day Smoker    Packs/day: 0.25    Types: Cigarettes  . Smokeless tobacco: Never Used  . Tobacco comment: 8 cigs daily  Substance Use Topics  . Alcohol use: No  . Drug use: No     Allergies   Vicodin [hydrocodone-acetaminophen]   Review of Systems Review of Systems  Constitutional: Negative for chills and fever.  Cardiovascular: Negative for leg swelling.  Musculoskeletal:       Positive for bilateral foot pain.  Skin: Negative for color change, rash and wound.  Neurological: Negative for weakness and numbness.     Physical Exam Updated Vital Signs BP (!) 146/104 (BP Location: Right Arm)   Pulse 88   Temp 98.2 F (36.8 C) (Oral)   Resp 18   Ht 5\' 9"  (1.753 m)   Wt 98.9 kg (218 lb)   SpO2 98%   BMI 32.19 kg/m   Physical Exam  Constitutional: He appears well-developed and well-nourished. No distress.  HENT:  Head: Normocephalic and atraumatic.  Eyes: Conjunctivae are normal.  Right eye exhibits no discharge. Left eye exhibits no discharge.  Cardiovascular:  Pulses:      Dorsalis pedis pulses are 2+ on the right side, and 2+ on the left side.       Posterior tibial pulses are 2+ on the right side, and 2+ on the left side.  Musculoskeletal:  Lower extremities: No obvious deformities, appreciable swelling, erythema, ecchymosis, open wounds, or overlying warmth.  Patient has normal range of motion to bilateral knees, ankles, and is able to move all digits.  He is tender to palpation over the heel as well as the proximal to mid plantar fascia bilaterally.  Some tenderness over the midfoot bilaterally as well.  There is no point/focal tenderness.  No malleoli or tenderness.  No tenderness at the base of  the fifth navicular bone.  Neurovascularly intact distally.  Neurological: He is alert.  Clear speech.  Sensation grossly intact bilateral lower extremities.  5 out of 5 strength plantar dorsiflexion bilaterally.  Gait intact.  Psychiatric: He has a normal mood and affect. His behavior is normal. Thought content normal.  Nursing note and vitals reviewed.  ED Treatments / Results  Labs (all labs ordered are listed, but only abnormal results are displayed) Labs Reviewed - No data to display  EKG None  Radiology No results found.  Procedures Procedures (including critical care time)  Medications Ordered in ED Medications - No data to display   Initial Impression / Assessment and Plan / ED Course  I have reviewed the triage vital signs and the nursing notes.  Pertinent labs & imaging results that were available during my care of the patient were reviewed by me and considered in my medical decision making (see chart for details).   Patient presents with bilateral foot pain described as being to the heels and plantar surfaces bilaterally.  Patient nontoxic-appearing, no apparent distress, vitals WNL with the exception of elevated blood pressure, patient aware of  need for recheck.  On exam patient has some heel and plantar fascia tenderness to palpation as well as some midfoot tenderness to palpation bilaterally.  Neurovascularly intact distally.  Patient attributes pain to being gout related, this does not necessarily appear consistent with gout given not specific to a joint, no erythema or swelling.  Patient is afebrile, there is no overlying erythema or warmth to raise suspicion for infectious etiology such as septic joint, cellulitis, or osteomyelitis.  No traumatic injury or significant change in activity to raise suspicion for fracture or dislocation.  Query plantar fasciitis based on history and exam. Unclear definitive etiology.  Will provide prescription for naproxen, last creatinine WNL, with recommendation for heel cups and PCP follow-up. I discussed  treatment plan, need for PCP follow-up, and return precautions with the patient. Provided opportunity for questions, patient confirmed understanding and is in agreement with plan.    Final Clinical Impressions(s) / ED Diagnoses   Final diagnoses:  Bilateral foot pain    ED Discharge Orders        Ordered    naproxen (NAPROSYN) 500 MG tablet  2 times daily     08/03/17 0043       Amaryllis Dyke, PA-C 08/03/17 0046    Fatima Blank, MD 08/04/17 [phone]

## 2017-08-03 NOTE — Discharge Instructions (Addendum)
You were seen in the emergency department today for foot pain.  This could be due to gout, however we think it also could be due to plantar fasciitis, please see the attached handout for further information.  We are sending you home with prescription for naproxen for pain.Naproxen is a nonsteroidal anti-inflammatory medication that will help with pain and swelling. Be sure to take this medication as prescribed with food, 1 pill every 12 hours,  It should be taken with food, as it can cause stomach upset, and more seriously, stomach bleeding. Do not take other nonsteroidal anti-inflammatory medications with this such as Advil, Motrin, or Aleve.   We have prescribed you new medication(s) today. Discuss the medications prescribed today with your pharmacist as they can have adverse effects and interactions with your other medicines including over the counter and prescribed medications. Seek medical evaluation if you start to experience new or abnormal symptoms after taking one of these medicines, seek care immediately if you start to experience difficulty breathing, feeling of your throat closing, facial swelling, or rash as these could be indications of a more serious allergic reaction  We recommend asking the pharmacist about heel cups, these are a device that she may placing her shoes to help alleviate discomfort.  Please follow-up with your primary care provider within 1 week for reevaluation of your symptoms as well as a recheck of your blood pressure as this was elevated in the emergency department today.  Return to the ER for new or worsening symptoms including but not limited to worsening pain, fever, numbness, weakness, or any other concerns.

## 2017-08-03 NOTE — Telephone Encounter (Signed)
Pt went to ED last night because of heel pain. He wanted to know do we have any type of cups to go down in the heel of his shoe, to keep from having pain. They stated that he had torn tissues in the heel of his foot. Please give him a call.

## 2017-08-03 NOTE — Telephone Encounter (Signed)
I called pt - naproxen was Rx'd at ED last night and he did pick that up. Advised to take that and he could also try getting OTC arch supports and icing 2-3 times daily. Also recommended visit with Dr. Cannon Kettle for follow up since its been a few months since she saw him. He wanted to go ahead and schedule - transfered to scheduler.

## 2017-08-10 MED FILL — !COLCRYS 0.6 MG TABLET: 0.6 MG | 30 days supply | Qty: 20 | Fill #1

## 2017-08-10 MED FILL — CYCLOBENZAPRINE 10 MG TAB: 10 | 30 days supply | Qty: 60 | Fill #3

## 2017-08-10 MED FILL — ALLOPURINOL 300 MG TAB: 300 | 30 days supply | Qty: 60 | Fill #1

## 2017-08-11 ENCOUNTER — Ambulatory Visit: Payer: BLUE CROSS/BLUE SHIELD | Admitting: Sports Medicine

## 2017-08-18 ENCOUNTER — Ambulatory Visit: Payer: Self-pay | Admitting: Sports Medicine

## 2017-08-20 ENCOUNTER — Other Ambulatory Visit: Payer: Self-pay

## 2017-08-21 ENCOUNTER — Other Ambulatory Visit: Payer: Self-pay

## 2017-08-21 MED ORDER — NAPROXEN 500 MG PO TABS: 500.0000 mg | ORAL_TABLET | Freq: Two times a day (BID) | ORAL | 0 refills | Status: DC

## 2017-08-21 MED FILL — NAPROXEN 500 MG TABLET: 500 | 15 days supply | Qty: 30 | Fill #0

## 2017-08-31 ENCOUNTER — Ambulatory Visit: Payer: BLUE CROSS/BLUE SHIELD | Admitting: Family Medicine

## 2017-09-04 ENCOUNTER — Other Ambulatory Visit: Payer: Self-pay | Admitting: Family Medicine

## 2017-09-04 MED FILL — !COLCRYS 0.6 MG TABLET: 0.6 MG | 30 days supply | Qty: 20 | Fill #2

## 2017-09-04 MED FILL — $Viagra 50mg tablet: 50 | 30 days supply | Qty: 10 | Fill #1

## 2017-09-04 MED FILL — NAPROXEN 500 MG TABLET: 500 | 30 days supply | Qty: 60 | Fill #0

## 2017-09-21 ENCOUNTER — Other Ambulatory Visit: Payer: Self-pay

## 2017-09-21 ENCOUNTER — Emergency Department (HOSPITAL_COMMUNITY): Payer: Self-pay

## 2017-09-21 ENCOUNTER — Encounter (HOSPITAL_COMMUNITY): Payer: Self-pay | Admitting: Emergency Medicine

## 2017-09-21 ENCOUNTER — Observation Stay (HOSPITAL_COMMUNITY)
Admission: EM | Admit: 2017-09-21 | Discharge: 2017-09-22 | Disposition: A | Discharge status: Home / Self Care | Payer: Self-pay | Attending: Internal Medicine | Admitting: Internal Medicine

## 2017-09-21 DIAGNOSIS — I10 Essential (primary) hypertension: Secondary | ICD-10-CM | POA: Insufficient documentation

## 2017-09-21 DIAGNOSIS — Z885 Allergy status to narcotic agent status: Secondary | ICD-10-CM | POA: Insufficient documentation

## 2017-09-21 DIAGNOSIS — E785 Hyperlipidemia, unspecified: Secondary | ICD-10-CM | POA: Insufficient documentation

## 2017-09-21 DIAGNOSIS — Z79899 Other long term (current) drug therapy: Secondary | ICD-10-CM | POA: Insufficient documentation

## 2017-09-21 DIAGNOSIS — N281 Cyst of kidney, acquired: Secondary | ICD-10-CM | POA: Insufficient documentation

## 2017-09-21 DIAGNOSIS — N179 Acute kidney failure, unspecified: Principal | ICD-10-CM | POA: Insufficient documentation

## 2017-09-21 DIAGNOSIS — R1013 Epigastric pain: Secondary | ICD-10-CM | POA: Insufficient documentation

## 2017-09-21 DIAGNOSIS — M109 Gout, unspecified: Secondary | ICD-10-CM | POA: Insufficient documentation

## 2017-09-21 DIAGNOSIS — E119 Type 2 diabetes mellitus without complications: Secondary | ICD-10-CM | POA: Insufficient documentation

## 2017-09-21 DIAGNOSIS — R079 Chest pain, unspecified: Secondary | ICD-10-CM

## 2017-09-21 DIAGNOSIS — E860 Dehydration: Secondary | ICD-10-CM | POA: Insufficient documentation

## 2017-09-21 DIAGNOSIS — F17210 Nicotine dependence, cigarettes, uncomplicated: Secondary | ICD-10-CM | POA: Insufficient documentation

## 2017-09-21 DIAGNOSIS — R0789 Other chest pain: Secondary | ICD-10-CM | POA: Insufficient documentation

## 2017-09-21 LAB — BASIC METABOLIC PANEL
Anion gap: 11 (ref 5–15)
BUN: 15 mg/dL (ref 6–20)
CALCIUM: 8.8 mg/dL — AB (ref 8.9–10.3)
CO2: 23 mmol/L (ref 22–32)
CREATININE: 1.6 mg/dL — AB (ref 0.61–1.24)
Chloride: 109 mmol/L (ref 98–111)
GFR calc Af Amer: 56 mL/min — ABNORMAL LOW (ref >60)
GFR, EST NON AFRICAN AMERICAN: 48 mL/min — AB (ref >60)
GLUCOSE: 137 mg/dL — AB (ref 70–99)
POTASSIUM: 3.2 mmol/L — AB (ref 3.5–5.1)
SODIUM: 143 mmol/L (ref 135–145)

## 2017-09-21 LAB — CBC
HCT: 46 % (ref 39.0–52.0)
Hemoglobin: 14.7 g/dL (ref 13.0–17.0)
MCH: 27.7 pg (ref 26.0–34.0)
MCHC: 32 g/dL (ref 30.0–36.0)
MCV: 86.6 fL (ref 78.0–100.0)
PLATELETS: 232 10*3/uL (ref 150–400)
RBC: 5.31 MIL/uL (ref 4.22–5.81)
RDW: 15 % (ref 11.5–15.5)
WBC: 12.8 10*3/uL — AB (ref 4.0–10.5)

## 2017-09-21 LAB — I-STAT TROPONIN, ED: Troponin i, poc: 0.01 ng/mL (ref 0.00–0.08)

## 2017-09-21 NOTE — ED Notes (Signed)
NO answer for rooming x 2

## 2017-09-21 NOTE — ED Triage Notes (Signed)
Pt states he has had side/chest pain that radiates to both shoulders x 2 weeks. Hurts to eat. Endorses nausea but no vomiting. No SHOB, dizziness, or diaphoresis. Pt also has right sided knee pain.

## 2017-09-21 NOTE — ED Notes (Signed)
No answer for rooming.

## 2017-09-22 ENCOUNTER — Other Ambulatory Visit: Payer: Self-pay

## 2017-09-22 ENCOUNTER — Encounter (HOSPITAL_COMMUNITY): Payer: Self-pay | Admitting: Internal Medicine

## 2017-09-22 ENCOUNTER — Observation Stay (HOSPITAL_COMMUNITY): Payer: Self-pay

## 2017-09-22 DIAGNOSIS — N179 Acute kidney failure, unspecified: Secondary | ICD-10-CM | POA: Diagnosis present

## 2017-09-22 DIAGNOSIS — E119 Type 2 diabetes mellitus without complications: Secondary | ICD-10-CM

## 2017-09-22 DIAGNOSIS — I10 Essential (primary) hypertension: Secondary | ICD-10-CM

## 2017-09-22 DIAGNOSIS — R079 Chest pain, unspecified: Secondary | ICD-10-CM | POA: Diagnosis present

## 2017-09-22 LAB — C DIFFICILE QUICK SCREEN W PCR REFLEX
C Diff antigen: NEGATIVE
C Diff interpretation: NOT DETECTED
C Diff toxin: NEGATIVE

## 2017-09-22 LAB — URINALYSIS, ROUTINE W REFLEX MICROSCOPIC
BACTERIA UA: NONE SEEN
BILIRUBIN URINE: NEGATIVE
Glucose, UA: NEGATIVE mg/dL
Ketones, ur: NEGATIVE mg/dL
LEUKOCYTES UA: NEGATIVE
NITRITE: NEGATIVE
PROTEIN: 100 mg/dL — AB
Specific Gravity, Urine: 1.029 (ref 1.005–1.030)
pH: 5 (ref 5.0–8.0)

## 2017-09-22 LAB — HIV ANTIBODY (ROUTINE TESTING W REFLEX): HIV Screen 4th Generation wRfx: NONREACTIVE

## 2017-09-22 LAB — BASIC METABOLIC PANEL
Anion gap: 10 (ref 5–15)
BUN: 15 mg/dL (ref 6–20)
CO2: 19 mmol/L — ABNORMAL LOW (ref 22–32)
CREATININE: 1.36 mg/dL — AB (ref 0.61–1.24)
Calcium: 7.9 mg/dL — ABNORMAL LOW (ref 8.9–10.3)
Chloride: 110 mmol/L (ref 98–111)
GFR calc non Af Amer: 58 mL/min — ABNORMAL LOW (ref >60)
Glucose, Bld: 110 mg/dL — ABNORMAL HIGH (ref 70–99)
Potassium: 3.4 mmol/L — ABNORMAL LOW (ref 3.5–5.1)
Sodium: 139 mmol/L (ref 135–145)

## 2017-09-22 LAB — TROPONIN I: Troponin I: <0.03 ng/mL (ref <0.03)

## 2017-09-22 MED ORDER — GI COCKTAIL ~~LOC~~
30.0000 mL | Freq: Three times a day (TID) | ORAL | Status: DC
  Administered 2017-09-22 (×2): 30 mL via ORAL
  Filled 2017-09-22 (×3): qty 30

## 2017-09-22 MED ORDER — PANTOPRAZOLE SODIUM 40 MG PO TBEC
40.0000 mg | DELAYED_RELEASE_TABLET | Freq: Two times a day (BID) | ORAL | Status: DC
  Administered 2017-09-22: 40 mg via ORAL
  Filled 2017-09-22: qty 1

## 2017-09-22 MED ORDER — ALBUTEROL SULFATE (2.5 MG/3ML) 0.083% IN NEBU: 2.5000 mg | INHALATION_SOLUTION | RESPIRATORY_TRACT | Status: DC | PRN

## 2017-09-22 MED ORDER — ALLOPURINOL 300 MG PO TABS: 300.0000 mg | ORAL_TABLET | Freq: Two times a day (BID) | ORAL | Status: DC

## 2017-09-22 MED ORDER — PANTOPRAZOLE SODIUM 40 MG PO TBEC: 40.0000 mg | DELAYED_RELEASE_TABLET | Freq: Two times a day (BID) | ORAL | 0 refills | Status: DC

## 2017-09-22 MED ORDER — ENOXAPARIN SODIUM 40 MG/0.4ML ~~LOC~~ SOLN
40.0000 mg | Freq: Every day | SUBCUTANEOUS | Status: DC
  Administered 2017-09-22: 40 mg via SUBCUTANEOUS
  Filled 2017-09-22: qty 0.4

## 2017-09-22 MED ORDER — LOPERAMIDE HCL 2 MG PO CAPS: 2.0000 mg | ORAL_CAPSULE | Freq: Four times a day (QID) | ORAL | Status: DC | PRN

## 2017-09-22 MED ORDER — POTASSIUM CHLORIDE CRYS ER 20 MEQ PO TBCR
40.0000 meq | EXTENDED_RELEASE_TABLET | Freq: Once | ORAL | Status: AC
  Administered 2017-09-22: 40 meq via ORAL
  Filled 2017-09-22: qty 2

## 2017-09-22 MED ORDER — ONDANSETRON HCL 4 MG/2ML IJ SOLN: 4.0000 mg | Freq: Four times a day (QID) | INTRAMUSCULAR | Status: DC | PRN

## 2017-09-22 MED ORDER — LOPERAMIDE HCL 2 MG PO CAPS
4.0000 mg | ORAL_CAPSULE | Freq: Once | ORAL | Status: AC
  Administered 2017-09-22: 4 mg via ORAL
  Filled 2017-09-22: qty 2

## 2017-09-22 MED ORDER — SODIUM CHLORIDE 0.9 % IV BOLUS
1000.0000 mL | Freq: Once | INTRAVENOUS | Status: AC
  Administered 2017-09-22: 1000 mL via INTRAVENOUS

## 2017-09-22 MED ORDER — LOPERAMIDE HCL 2 MG PO CAPS: 2.0000 mg | ORAL_CAPSULE | Freq: Four times a day (QID) | ORAL | 0 refills | Status: DC | PRN

## 2017-09-22 MED ORDER — ACETAMINOPHEN 325 MG PO TABS: 650.0000 mg | ORAL_TABLET | Freq: Four times a day (QID) | ORAL | Status: DC | PRN

## 2017-09-22 MED ORDER — ACETAMINOPHEN 650 MG RE SUPP: 650.0000 mg | Freq: Four times a day (QID) | RECTAL | Status: DC | PRN

## 2017-09-22 MED ORDER — POTASSIUM CHLORIDE CRYS ER 20 MEQ PO TBCR
20.0000 meq | EXTENDED_RELEASE_TABLET | Freq: Once | ORAL | Status: AC
  Administered 2017-09-22: 20 meq via ORAL
  Filled 2017-09-22: qty 1

## 2017-09-22 MED ORDER — SODIUM CHLORIDE 0.9 % IV SOLN
INTRAVENOUS | Status: DC
  Administered 2017-09-22: 05:00:00 via INTRAVENOUS

## 2017-09-22 MED ORDER — ATORVASTATIN CALCIUM 20 MG PO TABS
20.0000 mg | ORAL_TABLET | Freq: Every day | ORAL | Status: DC
  Administered 2017-09-22: 20 mg via ORAL
  Filled 2017-09-22: qty 1

## 2017-09-22 MED ORDER — ONDANSETRON HCL 4 MG PO TABS
4.0000 mg | ORAL_TABLET | Freq: Four times a day (QID) | ORAL | Status: DC | PRN
Start: 2017-09-22 — End: 2017-09-22

## 2017-09-22 NOTE — ED Provider Notes (Signed)
[facility] EMERGENCY DEPARTMENT Provider Note   CSN: [id number] Arrival date & time: 09/21/17  1946     History   Chief Complaint Chief Complaint  Patient presents with  . Chest Pain    HPI Terry Poole is a 52 y.o. male.  The history is provided by the patient and medical records.  Chest Pain      52 year old male with history of hypertension, diabetes, hyperlipidemia, presenting to the ED with chest pain.  Dates this is been bothering him for about 2 weeks.  Seems to start in his shoulder but radiates into the chest and down his right side.  Describes pain as burning.  He denies any shortness of breath, palpitations, dizziness, diaphoresis, or vomiting.  States his primary care doctor has been changing his medications and now he is having some nausea and whenever he eats "it runs right through me".  States it makes him not want to eat.  Pain is not worse when eating or drinking. States he has been feeling a little "off", mildly weak.  No syncopal events.  Denies known cardiac history.  He is a half PPD smoker.  States he has been having some issues with his knees and his feet, has seen his podiatrist for this and recommended some shoe inserts.  States he was told at one point he had gout, but states his doctor has since told him it was "torn tissues".  States he is currently on steroids or other gout medications.  Past Medical History:  Diagnosis Date  . Bronchitis   . Bronchitis   . Gout   . Hammertoe of second toe of right foot   . Smoker     Patient Active Problem List   Diagnosis Date Noted  . Hypertension 11/19/2016  . Type 2 diabetes mellitus ([place]) 08/19/2016  . Hyperlipidemia 08/19/2016  . Back pain 05/13/2016  . Hammertoe of second toe of right foot 03/06/2016  . Onychomycosis 04/16/2015  . Pseudofolliculitis barbae 11/57/2620  . Gout 03/28/2015    Past Surgical History:  Procedure Laterality Date  . HAMMER TOE SURGERY Right 03/10/2016   Procedure: 2ND RIGHT HAMMER TOE CORRECTION;  Surgeon: Landis Martins, DPM;  Location: [facility];  Service: Podiatry;  Laterality: Right;  . MASS EXCISION Right 03/10/2016   Procedure: EXCISION OF CALLUS 2ND RIGHT TOE;  Surgeon: Landis Martins, DPM;  Location: [facility];  Service: Podiatry;  Laterality: Right;  . NO PAST SURGERIES          Home Medications    Prior to Admission medications   Medication Sig Start Date End Date Taking? Authorizing Provider  acetaminophen-codeine (TYLENOL #3) 300-30 MG tablet Take 1 tablet by mouth at bedtime as needed for moderate pain. 06/01/17   [name], MD  allopurinol (ZYLOPRIM) 300 MG tablet Take 1 tablet (300 mg total) by mouth 2 (two) times daily. 06/01/17   [name], MD  amoxicillin (AMOXIL) 500 MG capsule Take 1 capsule (500 mg total) by mouth 3 (three) times daily. 07/21/17   [name], MD  atorvastatin (LIPITOR) 20 MG tablet Take 1 tablet (20 mg total) by mouth daily. 06/01/17   [name], MD  cetirizine (ZYRTEC) 10 MG tablet Take 1 tablet (10 mg total) by mouth daily. 07/21/17   [name], MD  clindamycin (CLEOCIN) 300 MG capsule Take 1 capsule (300 mg total) by mouth 2 (two) times daily. 06/01/17   [name], MD  lisinopril (PRINIVIL,ZESTRIL) 5 MG tablet  Take 1 tablet (5 mg total) by mouth daily. 06/01/17   [name], MD  naproxen (NAPROSYN) 500 MG tablet TAKE 1 TABLET BY MOUTH 2 TIMES DAILY. 09/04/17   [name], MD  sildenafil (VIAGRA) 50 MG tablet Take 1 tablet (50 mg total) by mouth daily as needed for erectile dysfunction. At least 24 hours between doses 07/21/17   [name], MD    Family History No family history on file.  Social History Social History   Tobacco Use  . Smoking status: Current Every Day Smoker    Packs/day: 0.25    Types: Cigarettes  . Smokeless tobacco: Never Used  . Tobacco comment: 8 cigs daily  Substance Use Topics  . Alcohol use: No    . Drug use: No     Allergies   Vicodin [hydrocodone-acetaminophen]   Review of Systems Review of Systems  Cardiovascular: Positive for chest pain.  All other systems reviewed and are negative.    Physical Exam Updated Vital Signs BP 127/84   Pulse 94   Temp 97.8 F (36.6 C) (Oral)   Resp (!) 23   SpO2 99%   Physical Exam  Constitutional: He is oriented to person, place, and time. He appears well-developed and well-nourished.  HENT:  Head: Normocephalic and atraumatic.  Mouth/Throat: Oropharynx is clear and moist.  Eyes: Pupils are equal, round, and reactive to light. Conjunctivae and EOM are normal.  Neck: Normal range of motion.  Cardiovascular: Normal rate, regular rhythm and normal heart sounds.  Pulmonary/Chest: Effort normal and breath sounds normal. He has no decreased breath sounds. He has no wheezes. He has no rales.  Tenderness of right chest wall to palpation, no deformities  Abdominal: Soft. Bowel sounds are normal.  Musculoskeletal: Normal range of motion.  Elicited pain with ROM of right shoulder, normal grip strength, normal distal sensation/perfusion  Neurological: He is alert and oriented to person, place, and time.  Skin: Skin is warm and dry.  Psychiatric: He has a normal mood and affect.  Nursing note and vitals reviewed.    ED Treatments / Results  Labs (all labs ordered are listed, but only abnormal results are displayed) Labs Reviewed  BASIC METABOLIC PANEL - Abnormal; Notable for the following components:      Result Value   Potassium 3.2 (*)    Glucose, Bld 137 (*)    Creatinine, Ser 1.60 (*)    Calcium 8.8 (*)    GFR calc non Af Amer 48 (*)    GFR calc Af Amer 56 (*)    All other components within normal limits  CBC - Abnormal; Notable for the following components:   WBC 12.8 (*)    All other components within normal limits  I-STAT TROPONIN, ED    EKG EKG Interpretation  Date/Time:  Monday September 21 2017 19:57:22  EDT Ventricular Rate:  92 PR Interval:  152 QRS Duration: 106 QT Interval:  380 QTC Calculation: 469 R Axis:   59 Text Interpretation:  Normal sinus rhythm Incomplete right bundle branch block Borderline ECG No significant change since last tracing Confirmed by Addison Lank [phone]) on 09/21/2017 11:21:25 PM   Radiology Dg Chest 2 View  Result Date: 09/21/2017 CLINICAL DATA:  Chest pain radiating to bilateral shoulder for 2 weeks. History of smoking, bronchitis. EXAM: CHEST - 2 VIEW COMPARISON:  Chest radiograph July 12, 2017 and CT chest Jun 21, 2017. FINDINGS: Cardiomediastinal silhouette is normal. No pleural effusions or focal consolidations. Trachea projects midline and there  is no pneumothorax. Soft tissue planes and included osseous structures are non-suspicious. Mild degenerative change of the thoracic spine. IMPRESSION: No acute cardiopulmonary process. Electronically Signed   By: Elon Alas M.D.   On: 09/21/2017 20:23    Procedures Procedures (including critical care time)  Medications Ordered in ED Medications  sodium chloride 0.9 % bolus 1,000 mL (has no administration in time range)     Initial Impression / Assessment and Plan / ED Course  I have reviewed the triage vital signs and the nursing notes.  Pertinent labs & imaging results that were available during my care of the patient were reviewed by me and considered in my medical decision making (see chart for details).  52 y.o. M here with chest pain.  After history of physical, this seems more MSK and related to pain in right shoulder.  Patient's cardiac markers are negative and EKG is unchanged from prior.  Does have lab findings notable for AKI, creatinine has doubled since June.  He reports he has been drinking adequate fluids.  He has been taking naproxen twice daily for over a month now for his joint pain.  Suspect this is likely NSAID induced.  This may be contributing to some of his GI upset as well.  Will  start IVF, admit for ongoing care.    Discussed with Dr. Alcario Drought, he will admit for ongoing care.  Final Clinical Impressions(s) / ED Diagnoses   Final diagnoses:  Chest pain in adult  AKI (acute kidney injury) [facility])    ED Discharge Orders    None       Larene Pickett, PA-C 09/22/17 0300    Orpah Greek, MD 09/22/17 [phone]

## 2017-09-22 NOTE — H&P (Signed)
History and Physical    Terry Poole [id number]:[id number] DOB: 06-Feb-1965 DOA: 09/21/2017  PCP: [name], MD  Patient coming from: Home  I have personally briefly reviewed patient's old medical records in [place]  Chief Complaint: Chest pain  HPI: Terry Poole is a 52 y.o. male with medical history significant of HTN, DM2, HLD, half PPD smoker.  Patient presents to ED with CP.  Symptoms ongoing for past 2 weeks.  Seems to start in his shoulder but radiates into the chest and down his right side.  Describes pain as burning.  He denies any shortness of breath, palpitations, dizziness, diaphoresis, or vomiting.  Was started on Naproxen about 1 month ago for knee and foot pain.  (Actually, looks like started ~ 7/1 based on telephone note from [facility]).   ED Course: Trop neg, EKG unremarkable.  Atypical CP symptoms were felt less concerning; however, work up in ED did reveal apparent AKI with creat 1.6 up from baseline 0.8.   Review of Systems: As per HPI otherwise 10 point review of systems negative.   Past Medical History:  Diagnosis Date  . Bronchitis   . Bronchitis   . Gout   . Hammertoe of second toe of right foot   . Smoker     Past Surgical History:  Procedure Laterality Date  . HAMMER TOE SURGERY Right 03/10/2016   Procedure: 2ND RIGHT HAMMER TOE CORRECTION;  Surgeon: Landis Martins, DPM;  Location: [facility];  Service: Podiatry;  Laterality: Right;  . MASS EXCISION Right 03/10/2016   Procedure: EXCISION OF CALLUS 2ND RIGHT TOE;  Surgeon: Landis Martins, DPM;  Location: [facility];  Service: Podiatry;  Laterality: Right;  . NO PAST SURGERIES       reports that he has been smoking cigarettes. He has been smoking about 0.25 packs per day. He has never used smokeless tobacco. He reports that he does not drink alcohol or use drugs.  Allergies  Allergen Reactions  . Vicodin [Hydrocodone-Acetaminophen] Hives    Family History  Problem  Relation Age of Onset  . Heart disease Neg Hx   . Kidney disease Neg Hx      Prior to Admission medications   Medication Sig Start Date End Date Taking? Authorizing Provider  allopurinol (ZYLOPRIM) 300 MG tablet Take 1 tablet (300 mg total) by mouth 2 (two) times daily. Patient not taking: Reported on 09/22/2017 06/01/17   [name], MD  atorvastatin (LIPITOR) 20 MG tablet Take 1 tablet (20 mg total) by mouth daily. Patient not taking: Reported on 09/22/2017 06/01/17   [name], MD  cetirizine (ZYRTEC) 10 MG tablet Take 1 tablet (10 mg total) by mouth daily. Patient not taking: Reported on 09/22/2017 07/21/17   [name], MD  lisinopril (PRINIVIL,ZESTRIL) 5 MG tablet Take 1 tablet (5 mg total) by mouth daily. Patient not taking: Reported on 09/22/2017 06/01/17   [name], MD  sildenafil (VIAGRA) 50 MG tablet Take 1 tablet (50 mg total) by mouth daily as needed for erectile dysfunction. At least 24 hours between doses Patient not taking: Reported on 09/22/2017 07/21/17   [name], MD    Physical Exam: Vitals:   09/21/17 2005 09/21/17 2214 09/22/17 0201  BP: (!) 145/88 130/89 127/84  Pulse: 91 92 94  Resp: 18 16 (!) 23  Temp: 97.8 F (36.6 C)    TempSrc: Oral    SpO2: 98% 100% 99%    Constitutional: NAD, calm, comfortable Eyes: PERRL, lids and  conjunctivae normal ENMT: Mucous membranes are moist. Posterior pharynx clear of any exudate or lesions.Normal dentition.  Neck: normal, supple, no masses, no thyromegaly Respiratory: clear to auscultation bilaterally, no wheezing, no crackles. Normal respiratory effort. No accessory muscle use.  Cardiovascular: Regular rate and rhythm, no murmurs / rubs / gallops. No extremity edema. 2+ pedal pulses. No carotid bruits.  Abdomen: no tenderness, no masses palpated. No hepatosplenomegaly. Bowel sounds positive.  Musculoskeletal: no clubbing / cyanosis. No joint deformity upper and lower extremities. Good ROM, no  contractures. Normal muscle tone.  Skin: no rashes, lesions, ulcers. No induration Neurologic: CN 2-12 grossly intact. Sensation intact, DTR normal. Strength 5/5 in all 4.  Psychiatric: Normal judgment and insight. Alert and oriented x 3. Normal mood.    Labs on Admission: I have personally reviewed following labs and imaging studies  CBC: Recent Labs  Lab 09/21/17 2001  WBC 12.8*  HGB 14.7  HCT 46.0  MCV 86.6  PLT 161   Basic Metabolic Panel: Recent Labs  Lab 09/21/17 2001  NA 143  K 3.2*  CL 109  CO2 23  GLUCOSE 137*  BUN 15  CREATININE 1.60*  CALCIUM 8.8*   GFR: CrCl cannot be calculated (Unknown ideal weight.). Liver Function Tests: No results for input(s): AST, ALT, ALKPHOS, BILITOT, PROT, ALBUMIN in the last 168 hours. No results for input(s): LIPASE, AMYLASE in the last 168 hours. No results for input(s): AMMONIA in the last 168 hours. Coagulation Profile: No results for input(s): INR, PROTIME in the last 168 hours. Cardiac Enzymes: No results for input(s): CKTOTAL, CKMB, CKMBINDEX, TROPONINI in the last 168 hours. BNP (last 3 results) No results for input(s): PROBNP in the last 8760 hours. HbA1C: No results for input(s): HGBA1C in the last 72 hours. CBG: No results for input(s): GLUCAP in the last 168 hours. Lipid Profile: No results for input(s): CHOL, HDL, LDLCALC, TRIG, CHOLHDL, LDLDIRECT in the last 72 hours. Thyroid Function Tests: No results for input(s): TSH, T4TOTAL, FREET4, T3FREE, THYROIDAB in the last 72 hours. Anemia Panel: No results for input(s): VITAMINB12, FOLATE, FERRITIN, TIBC, IRON, RETICCTPCT in the last 72 hours. Urine analysis:    Component Value Date/Time   COLORURINE YELLOW 07/09/2015 0348   APPEARANCEUR CLEAR 07/09/2015 0348   LABSPEC 1.024 07/09/2015 0348   PHURINE 7.0 07/09/2015 0348   GLUCOSEU NEGATIVE 07/09/2015 0348   HGBUR NEGATIVE 07/09/2015 0348   BILIRUBINUR NEGATIVE 07/09/2015 0348   KETONESUR 15 (A) 07/09/2015  0348   PROTEINUR NEGATIVE 07/09/2015 0348   NITRITE NEGATIVE 07/09/2015 0348   LEUKOCYTESUR NEGATIVE 07/09/2015 0348    Radiological Exams on Admission: Dg Chest 2 View  Result Date: 09/21/2017 CLINICAL DATA:  Chest pain radiating to bilateral shoulder for 2 weeks. History of smoking, bronchitis. EXAM: CHEST - 2 VIEW COMPARISON:  Chest radiograph July 12, 2017 and CT chest Jun 21, 2017. FINDINGS: Cardiomediastinal silhouette is normal. No pleural effusions or focal consolidations. Trachea projects midline and there is no pneumothorax. Soft tissue planes and included osseous structures are non-suspicious. Mild degenerative change of the thoracic spine. IMPRESSION: No acute cardiopulmonary process. Electronically Signed   By: Elon Alas M.D.   On: 09/21/2017 20:23    EKG: Independently reviewed.  Assessment/Plan Principal Problem:   AKI (acute kidney injury) ([place]) Active Problems:   Type 2 diabetes mellitus ([place])   Hypertension   Chest pain    1. AKI - Naproxen suspected as cause 1. Stop naproxen 2. Hold lisinopril 3. Strict intake and output 4.  IVF: NS at 100 cc/hr 5. UA pending 6. US renal pending 7. Repeat BMP in AM 8. Consult nephrology if not improving 2. Chest pain - 1. Will check serial trops 2. But symptoms are rather atypical for ACS. 3. HTN - 1. Holding lisinopril 4. DM2 - 1. Appears to be diet controlled, A1C 6.4.  DVT prophylaxis: Lovenox Code Status: Full Family Communication: No family in room Disposition Plan: Home after admit Consults called: None Admission status: Place in obs   Annell Canty, [facility] Pager [phone] Only works nights!  If 7AM-7PM, please contact the primary day team physician taking care of patient  [email or web address] Password [email or web address]  09/22/2017, 3:05 AM

## 2017-09-22 NOTE — Progress Notes (Signed)
Pt had 2 large loose stools today, negative for C-diff. Pt feeling better, tolerating regular diet. Discharge instructions given to pt. Discharged to home accompanied by family.

## 2017-09-22 NOTE — Discharge Instructions (Signed)
Follow with Primary MD [name], MD in 7 days, do not use naproxen or other NSAID like medications in the future.  Get CBC, CMP,  checked  by Primary MD in 5-7 days    Activity: As tolerated with Full fall precautions use walker/cane & assistance as needed  Disposition Home    Diet:  Heart Healthy    For Heart failure patients - Check your Weight same time everyday, if you gain over 2 pounds, or you develop in leg swelling, experience more shortness of breath or chest pain, call your Primary MD immediately. Follow Cardiac Low Salt Diet and 1.5 lit/day fluid restriction.  Special Instructions: If you have smoked or chewed Tobacco  in the last 2 yrs please stop smoking, stop any regular Alcohol  and or any Recreational drug use.  On your next visit with your primary care physician please Get Medicines reviewed and adjusted.  Please request your Prim.MD to go over all Hospital Tests and Procedure/Radiological results at the follow up, please get all Hospital records sent to your Prim MD by signing hospital release before you go home.  If you experience worsening of your admission symptoms, develop shortness of breath, life threatening emergency, suicidal or homicidal thoughts you must seek medical attention immediately by calling 911 or calling your MD immediately  if symptoms less severe.  You Must read complete instructions/literature along with all the possible adverse reactions/side effects for all the Medicines you take and that have been prescribed to you. Take any new Medicines after you have completely understood and accpet all the possible adverse reactions/side effects.

## 2017-09-22 NOTE — Discharge Summary (Signed)
Terry Poole [id number]:[id number] DOB: 1965/04/12 DOA: 09/21/2017  PCP: [name], MD  Admit date: 09/21/2017  Discharge date: 09/22/2017  Admitted From: Home   Disposition:  Home   Recommendations for Outpatient Follow-up:   Follow up with PCP in 1-2 weeks  PCP Please obtain BMP/CBC, 2 view CXR in 1week,  (see Discharge instructions)   PCP Please follow up on the following pending results: None   Home Health: None   Equipment/Devices: None  Consultations: None Discharge Condition: Stable   CODE STATUS: Full   Diet Recommendation: Heart Healthy    Chief Complaint  Patient presents with  . Chest Pain     Brief history of present illness from the day of admission and additional interim summary     Terry Poole is a 52 y.o. male with medical history significant of HTN, DM2, HLD, half PPD smoker.  Patient presents to ED with CP.  Symptoms ongoing for past 2 weeks. Initially there was a suspicion that patient was having atypical chest pain however further history revealed that he actually had epigastric burning chest pain with some substernal component ongoing for several weeks along with some diarrhea.                                                                 Hospital Course    1.  Epigastric pain with some substernal burning.  Highly suggestive of gastritis along with possible esophagitis, recently had increased NSAID intake, much relieved after 1 dose of GI cocktail, has been requested to stop taking any further NSAIDs, will be placed on twice daily PPI, he also had some nonspecific diarrhea which was C. difficile negative, PRN Imodium for the same.  Have requested him to follow with GI and PCP within a week.  2.  ARF due to combination of recent NSAID use, ACE inhibitor and diarrhea induced   dehydration.  Renal ultrasound unremarkable, improved after IV fluids, continue to refrain from using offending medications in the future, follow with PCP.  Discharge diagnosis     Principal Problem:   AKI (acute kidney injury) ([place]) Active Problems:   Type 2 diabetes mellitus ([place])   Hypertension   Chest pain    Discharge instructions    Discharge Instructions    Diet - low sodium heart healthy   Complete by:  As directed    Discharge instructions   Complete by:  As directed    Follow with Primary MD [name], MD in 7 days   Get CBC, CMP,  checked  by Primary MD in 5-7 days    Activity: As tolerated with Full fall precautions use walker/cane & assistance as needed  Disposition Home    Diet:  Heart Healthy    For Heart failure patients - Check your Weight same time  everyday, if you gain over 2 pounds, or you develop in leg swelling, experience more shortness of breath or chest pain, call your Primary MD immediately. Follow Cardiac Low Salt Diet and 1.5 lit/day fluid restriction.  Special Instructions: If you have smoked or chewed Tobacco  in the last 2 yrs please stop smoking, stop any regular Alcohol  and or any Recreational drug use.  On your next visit with your primary care physician please Get Medicines reviewed and adjusted.  Please request your Prim.MD to go over all Hospital Tests and Procedure/Radiological results at the follow up, please get all Hospital records sent to your Prim MD by signing hospital release before you go home.  If you experience worsening of your admission symptoms, develop shortness of breath, life threatening emergency, suicidal or homicidal thoughts you must seek medical attention immediately by calling 911 or calling your MD immediately  if symptoms less severe.  You Must read complete instructions/literature along with all the possible adverse reactions/side effects for all the Medicines you take and that have been prescribed to you.  Take any new Medicines after you have completely understood and accpet all the possible adverse reactions/side effects.   Increase activity slowly   Complete by:  As directed       Discharge Medications   Allergies as of 09/22/2017      Reactions   Vicodin [hydrocodone-acetaminophen] Hives      Medication List    STOP taking these medications   cetirizine 10 MG tablet Commonly known as:  ZYRTEC   lisinopril 5 MG tablet Commonly known as:  PRINIVIL,ZESTRIL     TAKE these medications   allopurinol 300 MG tablet Commonly known as:  ZYLOPRIM Take 1 tablet (300 mg total) by mouth 2 (two) times daily.   atorvastatin 20 MG tablet Commonly known as:  LIPITOR Take 1 tablet (20 mg total) by mouth daily.   loperamide 2 MG capsule Commonly known as:  IMODIUM Take 1 capsule (2 mg total) by mouth every 6 (six) hours as needed for diarrhea or loose stools.   pantoprazole 40 MG tablet Commonly known as:  PROTONIX Take 1 tablet (40 mg total) by mouth 2 (two) times daily.   sildenafil 50 MG tablet Commonly known as:  VIAGRA Take 1 tablet (50 mg total) by mouth daily as needed for erectile dysfunction. At least 24 hours between doses       Follow-up Information    [name], MD. Schedule an appointment as soon as possible for a visit in 1 week(s).   Specialty:  Family Medicine Contact information: [place] [address] [phone]        Otis Brace, MD. Schedule an appointment as soon as possible for a visit in 1 week(s).   Specialty:  Gastroenterology Why:  GERD, Diarrhea Contact information: [place] [address] [phone]           Major procedures and Radiology Reports - PLEASE review detailed and final reports thoroughly  -         Dg Chest 2 View  Result Date: 09/21/2017 CLINICAL DATA:  Chest pain radiating to bilateral shoulder for 2 weeks. History of smoking, bronchitis. EXAM: CHEST - 2  VIEW COMPARISON:  Chest radiograph July 12, 2017 and CT chest Jun 21, 2017. FINDINGS: Cardiomediastinal silhouette is normal. No pleural effusions or focal consolidations. Trachea projects midline and there is no pneumothorax. Soft tissue planes and included osseous structures are non-suspicious.  Mild degenerative change of the thoracic spine. IMPRESSION: No acute cardiopulmonary process. Electronically Signed   By: Elon Alas M.D.   On: 09/21/2017 20:23   US Renal  Result Date: 09/22/2017 CLINICAL DATA:  Initial evaluation for acute renal injury. EXAM: RENAL / URINARY TRACT ULTRASOUND COMPLETE COMPARISON:  None. FINDINGS: Right Kidney: Length: 10.8 cm. Echogenicity within normal limits. No hydronephrosis. 1.4 x 1.5 x 1.7 cm simple cyst at the lower pole. Left Kidney: Length: 9.3 cm. Echogenicity within normal limits. No mass or hydronephrosis visualized. Bladder: Appears normal for degree of bladder distention. IMPRESSION: 1. Negative renal ultrasound.  No hydronephrosis. 2. 1.7 cm simple right renal cyst. Electronically Signed   By: Jeannine Boga M.D.   On: 09/22/2017 03:45    Micro Results     Recent Results (from the past 240 hour(s))  C difficile quick scan w PCR reflex     Status: None   Collection Time: 09/22/17 11:09 AM  Result Value Ref Range Status   C Diff antigen NEGATIVE NEGATIVE Final   C Diff toxin NEGATIVE NEGATIVE Final   C Diff interpretation No C. difficile detected.  Final    Comment: Performed at [facility], [place] [address], [place], [place] [address]    Today   Subjective    Steffan Caniglia today has no headache,no chest abdominal pain,no new weakness tingling or numbness, feels much better wants to go home today.     Objective   Blood pressure 119/82, pulse 92, temperature 98.2 F (36.8 C), temperature source Oral, resp. rate 20, height 5\' 9"  (1.753 m), weight 102.1 kg, SpO2 95 %.   Intake/Output Summary (Last 24 hours) at 09/22/2017 1348 Last  data filed at 09/22/2017 1300 Gross per 24 hour  Intake 1570.69 ml  Output 200 ml  Net 1370.69 ml    Exam Awake Alert, Oriented x 3, No new F.N deficits, Normal affect [place].AT,PERRAL Supple Neck,No JVD, No cervical lymphadenopathy appriciated.  Symmetrical Chest wall movement, Good air movement bilaterally, CTAB RRR,No Gallops,Rubs or new Murmurs, No Parasternal Heave +ve B.Sounds, Abd Soft, Non tender, No organomegaly appriciated, No rebound -guarding or rigidity. No Cyanosis, Clubbing or edema, No new Rash or bruise   Data Review   CBC w Diff:  Lab Results  Component Value Date   WBC 12.8 (H) 09/21/2017   HGB 14.7 09/21/2017   HCT 46.0 09/21/2017   PLT 232 09/21/2017   LYMPHOPCT 59 06/21/2017   MONOPCT 5 06/21/2017   EOSPCT 2 06/21/2017   BASOPCT 0 06/21/2017    CMP:  Lab Results  Component Value Date   NA 139 09/22/2017   NA 141 06/11/2017   K 3.4 (L) 09/22/2017   CL 110 09/22/2017   CO2 19 (L) 09/22/2017   BUN 15 09/22/2017   BUN 13 06/11/2017   CREATININE 1.36 (H) 09/22/2017   CREATININE 0.94 09/21/2015   PROT 7.0 06/21/2017   PROT 7.2 06/11/2017   ALBUMIN 3.8 06/21/2017   ALBUMIN 4.3 06/11/2017   BILITOT 0.8 06/21/2017   BILITOT 0.6 06/11/2017   ALKPHOS 102 06/21/2017   AST 37 06/21/2017   ALT 60 06/21/2017  .   Total Time in preparing paper work, data evaluation and todays exam - 90 minutes  Lala Lund M.D on 09/22/2017 at 1:48 PM  [facility]   Office  [phone]

## 2017-09-24 ENCOUNTER — Encounter: Payer: Self-pay | Admitting: Physician Assistant

## 2017-09-24 ENCOUNTER — Encounter: Payer: Self-pay | Admitting: Family Medicine

## 2017-09-24 ENCOUNTER — Other Ambulatory Visit: Payer: Self-pay

## 2017-09-24 ENCOUNTER — Ambulatory Visit: Payer: Self-pay | Attending: Family Medicine | Admitting: Family Medicine

## 2017-09-24 ENCOUNTER — Ambulatory Visit (HOSPITAL_COMMUNITY)
Admission: RE | Admit: 2017-09-24 | Discharge: 2017-09-24 | Disposition: A | Discharge status: Home / Self Care | Payer: Self-pay | Source: Ambulatory Visit | Attending: Family Medicine | Admitting: Family Medicine

## 2017-09-24 VITALS — BP 128/78 | HR 96 | Temp 97.8°F | Resp 20 | Ht 69.0 in | Wt 216.6 lb

## 2017-09-24 DIAGNOSIS — N179 Acute kidney failure, unspecified: Secondary | ICD-10-CM

## 2017-09-24 DIAGNOSIS — R0789 Other chest pain: Secondary | ICD-10-CM

## 2017-09-24 DIAGNOSIS — Z23 Encounter for immunization: Secondary | ICD-10-CM

## 2017-09-24 DIAGNOSIS — R110 Nausea: Secondary | ICD-10-CM

## 2017-09-24 DIAGNOSIS — F17210 Nicotine dependence, cigarettes, uncomplicated: Secondary | ICD-10-CM | POA: Insufficient documentation

## 2017-09-24 DIAGNOSIS — M79671 Pain in right foot: Secondary | ICD-10-CM

## 2017-09-24 DIAGNOSIS — R197 Diarrhea, unspecified: Secondary | ICD-10-CM

## 2017-09-24 DIAGNOSIS — E119 Type 2 diabetes mellitus without complications: Secondary | ICD-10-CM | POA: Insufficient documentation

## 2017-09-24 DIAGNOSIS — M109 Gout, unspecified: Secondary | ICD-10-CM | POA: Insufficient documentation

## 2017-09-24 DIAGNOSIS — R1013 Epigastric pain: Secondary | ICD-10-CM

## 2017-09-24 LAB — POCT GLYCOSYLATED HEMOGLOBIN (HGB A1C)
HbA1c POC (<> result, manual entry): 6.5 %
HbA1c, POC (controlled diabetic range): 6.5 % (ref 0.0–7.0)
HbA1c, POC (prediabetic range): 6.5 % — AB (ref 5.7–6.4)
Hemoglobin A1C: 6.5 % — AB (ref 4.0–5.6)

## 2017-09-24 MED ORDER — ONDANSETRON 4 MG PO TBDP
4.0000 mg | ORAL_TABLET | Freq: Once | ORAL | Status: AC
  Administered 2017-09-24: 4 mg via ORAL

## 2017-09-24 NOTE — Progress Notes (Signed)
Subjective:    Patient ID: Terry Poole, male    DOB: [birth year], 52 y.o.   MRN: [id number]  HPI 52 yo male seen in follow-up of recent hospitalization for chest pain which per patient radiated from his abdomen to his chest as well as some substernal burning sensation.  Patient states that he had chest pain for about 2 weeks before going to the emergency department.  Patient had been taking naproxen for pain in his right foot.  Patient states that he initially had been told that he had gout but then was later told that he does not have gout.  Patient states that he was told in the hospital that the naproxen had caused him to have chest pain/stomach pain as well as injury to his kidneys.  Patient states that he is stopped taking the naproxen.  Patient also states that prior to his hospitalization, he was having issues with swelling in his lower legs as well as itching of his legs.  Patient was admitted on 09/21/2017 and discharged on 09/22/2017.  Patient also reports that he is not currently taking the reflux medication prescribed by the hospital as he has felt better.  Patient however has had recurrent diarrhea.  Patient states that within a few minutes of eating or drinking anything, he has onset of loose stools.  Patient denies any blood in the stool or dark stools currently but states that prior to his hospital admission his stools may have been dark or black.  Patient however was also taking Pepto-Bismol to help with the burning sensation in his chest.  Patient is trying to drink water to remain hydrated.  Per his hospital notes, patient had negative test for C. difficile.  Patient states that he did take 2 of the loperamide pills that were prescribed for his diarrhea but has not taken any since the day after hospital discharge.  Patient denies any current chest pain but reports that he does have some pain in his upper mid abdomen that is made worse by eating spicy foods.  Patient states that he has been  eating foods that he should not eat and he also likes to use hot sauce which he thinks may have caused some of his stomach issues.  Patient states that he is not really taking any of the medications that he was prescribed at hospital discharge.  Past Medical History:  Diagnosis Date  . Bronchitis   . Bronchitis   . Gout   . Hammertoe of second toe of right foot   . Smoker   Per hospital records, patient also appears to have history of diet-controlled diabetes with most recent hemoglobin A1c of 6.4. Family History  Problem Relation Age of Onset  . Heart disease Neg Hx   . Kidney disease Neg Hx    Social History   Tobacco Use  . Smoking status: Current Every Day Smoker    Packs/day: 0.25    Types: Cigarettes  . Smokeless tobacco: Never Used  . Tobacco comment: 8 cigs daily  Substance Use Topics  . Alcohol use: No  . Drug use: No   Past Surgical History:  Procedure Laterality Date  . HAMMER TOE SURGERY Right 03/10/2016   Procedure: 2ND RIGHT HAMMER TOE CORRECTION;  Surgeon: Landis Martins, DPM;  Location: [place];  Service: Podiatry;  Laterality: Right;  . MASS EXCISION Right 03/10/2016   Procedure: EXCISION OF CALLUS 2ND RIGHT TOE;  Surgeon: Landis Martins, DPM;  Location: [facility];  Service: Podiatry;  Laterality: Right;  . NO PAST SURGERIES      Allergies  Allergen Reactions  . Vicodin [Hydrocodone-Acetaminophen] Hives    Patient stated he is no longer allergic to vicodin, patient stated that he was allergic about 12 years ago.     Review of Systems  Constitutional: Positive for fatigue. Negative for chills and fever.  HENT: Negative for sore throat and trouble swallowing.   Respiratory: Negative for cough and shortness of breath.   Cardiovascular: Negative for chest pain, palpitations and leg swelling.  Gastrointestinal: Positive for abdominal pain, diarrhea and nausea.  Endocrine: Positive for polydipsia. Negative for polyphagia and  polyuria.  Genitourinary: Negative for difficulty urinating, dysuria, flank pain and frequency.  Musculoskeletal: Positive for arthralgias and gait problem (Occasional problems with gait secondary to foot pain).  Neurological: Negative for dizziness and headaches.       Objective:   Physical Exam  BP 128/78 (BP Location: Left Arm, Patient Position: Sitting, Cuff Size: Normal)   Pulse 96   Temp 97.8 F (36.6 C) (Oral)   Resp 20   Ht 5\' 9"  (1.753 m)   Wt 216 lb 9.6 oz (98.2 kg)   SpO2 96%   BMI 31.99 kg/m   Vital signs and nurse's note reviewed General- obese older male in no acute distress ENT- TMs gray, nares with mild edema of the nasal turbinates, mild posterior pharynx erythema Neck- supple, no lymphadenopathy, no thyromegaly, no carotid bruit Lungs-clear to auscultation bilaterally Cardiovascular- regular rate and rhythm Abdomen- patient with abdominal distention, hyperactive bowel sounds, epigastric tenderness without rebound or guarding Back-no CVA tenderness Extremities- bilateral nonpitting distal lower extremity edema.  Patient with some healing/scabbed excoriations on the distal lower legs right greater than left     Assessment & Plan:  1. Epigastric pain Patient with complaint of continued issues with epigastric pain.  Patient's hospital admission and discharge notes as well as labs reviewed.  Patient was thought to have possible gastritis.  Discussed with patient the importance of taking the pantoprazole that was prescribed and patient agrees to restart this medication.  Patient will also be referred to gastroenterology for further evaluation of his epigastric pain as well as his continued diarrhea.  Patient will have CBC in follow-up of epigastric pain as patient did have some mild anemia with hemoglobin of 12.6 during hospitalization.  Patient will also have CMP to look for any abnormalities and liver enzymes or electrolytes.  Patient will have lipase secondary to  continued epigastric pain.  Patient will also have H. pylori breath test to today's visit to look for bacteria that can cause stomach ulcers.  Patient will be notified of the results and if any further follow-up or treatment is needed based on these results.  CMP and CBC were also recommended to be done per patient's hospital discharge orders as well as a 2 view chest x-ray which will also be ordered. - ondansetron (ZOFRAN-ODT) disintegrating tablet 4 mg - CBC with Differential - Comprehensive metabolic panel - Lipase - Ambulatory referral to Gastroenterology - H. pylori breath test  2. Nausea Patient with complaint of nausea and did have acute onset of nausea while in the office.  Patient was given Zofran ODT 4 mg x 1 with improvement in symptoms.  Patient will also have CMP and hemoglobin A1c in follow-up of his nausea.  Per records, patient may be prediabetic or diet-controlled diabetic and may need further evaluation based on results. - ondansetron (ZOFRAN-ODT) disintegrating tablet 4 mg -  Comprehensive metabolic panel - HgB A6T - Ambulatory referral to Gastroenterology  3. Diarrhea, unspecified type Patient with complaint of continued diarrhea.  Patient was prescribed Imodium/loperamide at hospital discharge but patient states that he only took 2 of these pills.  Patient is encouraged to take medication as per package directions.  Patient will have CMP and follow-up of his diarrhea but will also be referred to gastroenterology for further evaluation and treatment.  Patient is encouraged to continue to remain well-hydrated. - Comprehensive metabolic panel - Ambulatory referral to Gastroenterology  4. Acute kidney injury ([place]) Patient was found to have acute kidney injury which was thought to be secondary to dehydration and use of nonsteroidal anti-inflammatory/naproxen for foot pain.  Patient's lisinopril was discontinued secondary to his acute kidney injury.  Patient had an initial  creatinine of 1.6 which decreased to 1.36 after IV hydration.  Patient will have recheck of creatinine as part of CMP and patient is encouraged to remain hydrated and avoid use of nonsteroidal anti-inflammatories. - CBC with Differential - Comprehensive metabolic panel  5. Chest pain, atypical Patient with atypical chest pain which per hospital notes was thought to be related to his gastritis.  Patient however is also having recurrent diarrhea therefore patient may also have issues with his gallbladder.  CMP is being done to look at liver function test.  Patient is being referred to GI for further evaluation.  Patient had negative troponins with recent overnight hospitalization.  Patient will also be scheduled for chest x-ray.  Patient is encouraged to take his pantoprazole as prescribed at hospital discharge to help with acid reflux/gastritis with possible esophagitis. - DG Chest 2 View; Future  6. Foot pain, right Patient states that he has had issues with recurrent pain in his right foot which usually occurs at the back of his right heel.  Patient states that pain somewhat improved with the use of shoe insert.  Patient states that he has been told at one point that he had gout but then was later told that he did not have gout.  Patient reluctantly agrees to have uric acid level done at today's visit.  Patient was prescribed allopurinol at hospital discharge but he has not been taking this medication.  Patient will be notified of the results and if uric acid is elevated, he may need medication for treatment of gout. - Uric Acid  7. Need for immunization against influenza Patient was offered influenza immunization at today's visit which he agreed to have done and patient received influenza immunization along with patient education on the vaccination and any possible side effects. - Flu Vaccine QUAD 36+ mos IM  An After Visit Summary was printed and given to the patient.  Return in about 2 weeks  (around 10/08/2017) for diarrhea/stomach pain-Dr. Margarita Rana- 2 weeks.

## 2017-09-24 NOTE — Patient Instructions (Signed)
Diarrhea, Adult Diarrhea is when you have loose and water poop (stool) often. Diarrhea can make you feel weak and cause you to get dehydrated. Dehydration can make you tired and thirsty, make you have a dry mouth, and make it so you pee (urinate) less often. Diarrhea often lasts 2-3 days. However, it can last longer if it is a sign of something more serious. It is important to treat your diarrhea as told by your doctor. Follow these instructions at home: Eating and drinking  Follow these recommendations as told by your doctor:  Take an oral rehydration solution (ORS). This is a drink that is sold at pharmacies and stores.  Drink clear fluids, such as: ? Water. ? Ice chips. ? Diluted fruit juice. ? Low-calorie sports drinks.  Eat bland, easy-to-digest foods in small amounts as you are able. These foods include: ? Bananas. ? Applesauce. ? Rice. ? Low-fat (lean) meats. ? Toast. ? Crackers.  Avoid drinking fluids that have a lot of sugar or caffeine in them.  Avoid alcohol.  Avoid spicy or fatty foods.  General instructions   Drink enough fluid to keep your pee (urine) clear or pale yellow.  Wash your hands often. If you cannot use soap and water, use hand sanitizer.  Make sure that all people in your home wash their hands well and often.  Take over-the-counter and prescription medicines only as told by your doctor.  Rest at home while you get better.  Watch your condition for any changes.  Take a warm bath to help with any burning or pain from having diarrhea.  Keep all follow-up visits as told by your doctor. This is important. Contact a doctor if:  You have a fever.  Your diarrhea gets worse.  You have new symptoms.  You cannot keep fluids down.  You feel light-headed or dizzy.  You have a headache.  You have muscle cramps. Get help right away if:  You have chest pain.  You feel very weak or you pass out (faint).  You have bloody or black poop or  poop that look like tar.  You have very bad pain, cramping, or bloating in your belly (abdomen).  You have trouble breathing or you are breathing very quickly.  Your heart is beating very quickly.  Your skin feels cold and clammy.  You feel confused.  You have signs of dehydration, such as: ? Dark pee, hardly any pee, or no pee. ? Cracked lips. ? Dry mouth. ? Sunken eyes. ? Sleepiness. ? Weakness. This information is not intended to replace advice given to you by your health care provider. Make sure you discuss any questions you have with your health care provider. Document Released: 07/09/2007 Document Revised: 08/10/2015 Document Reviewed: 09/26/2014 Elsevier Interactive Patient Education  2018 [place].  Gastritis, Adult Gastritis is swelling (inflammation) of the stomach. When you have this condition, you can have these problems (symptoms):  Pain in your stomach.  A burning feeling in your stomach.  Feeling sick to your stomach (nauseous).  Throwing up (vomiting).  Feeling too full after you eat.  It is important to get help for this condition. Without help, your stomach can bleed, and you can get sores (ulcers) in your stomach. Follow these instructions at home:  Take over-the-counter and prescription medicines only as told by your doctor.  If you were prescribed an antibiotic medicine, take it as told by your doctor. Do not stop taking it even if you start to feel better.  Drink enough fluid to keep your pee (urine) clear or pale yellow.  Instead of eating big meals, eat small meals often. Contact a health care provider if:  Your problems get worse.  Your problems go away and then come back. Get help right away if:  You throw up blood or something that looks like coffee grounds.  You have black or dark red poop (stools).  You cannot keep fluids down.  Your stomach pain gets worse.  You have a fever.  You do not feel better after 1 week. This  information is not intended to replace advice given to you by your health care provider. Make sure you discuss any questions you have with your health care provider. Document Released: 07/09/2007 Document Revised: 09/19/2015 Document Reviewed: 10/14/2014 Elsevier Interactive Patient Education  [employer].

## 2017-09-25 ENCOUNTER — Telehealth: Payer: Self-pay | Admitting: Family Medicine

## 2017-09-25 ENCOUNTER — Telehealth: Payer: Self-pay

## 2017-09-25 LAB — CBC WITH DIFFERENTIAL/PLATELET
Basophils Absolute: 0 x10E3/uL (ref 0.0–0.2)
Basos: 0 %
EOS (ABSOLUTE): 4.2 x10E3/uL — ABNORMAL HIGH (ref 0.0–0.4)
Eos: 33 %
Hematocrit: 43.6 % (ref 37.5–51.0)
Hemoglobin: 14 g/dL (ref 13.0–17.7)
Immature Grans (Abs): 0 x10E3/uL (ref 0.0–0.1)
Immature Granulocytes: 0 %
Lymphocytes Absolute: 2.3 x10E3/uL (ref 0.7–3.1)
Lymphs: 19 %
MCH: 27.6 pg (ref 26.6–33.0)
MCHC: 32.1 g/dL (ref 31.5–35.7)
MCV: 86 fL (ref 79–97)
Monocytes Absolute: 0.2 x10E3/uL (ref 0.1–0.9)
Monocytes: 2 %
Neutrophils Absolute: 5.7 x10E3/uL (ref 1.4–7.0)
Neutrophils: 46 %
Platelets: 205 x10E3/uL (ref 150–450)
RBC: 5.07 x10E6/uL (ref 4.14–5.80)
RDW: 15.1 % (ref 12.3–15.4)
WBC: 12.4 x10E3/uL — ABNORMAL HIGH (ref 3.4–10.8)

## 2017-09-25 LAB — COMPREHENSIVE METABOLIC PANEL WITH GFR
ALT: 35 IU/L (ref 0–44)
AST: 18 IU/L (ref 0–40)
Albumin/Globulin Ratio: 0.9 — ABNORMAL LOW (ref 1.2–2.2)
Albumin: 3.7 g/dL (ref 3.5–5.5)
Alkaline Phosphatase: 198 IU/L — ABNORMAL HIGH (ref 39–117)
BUN/Creatinine Ratio: 8 — ABNORMAL LOW (ref 9–20)
BUN: 10 mg/dL (ref 6–24)
Bilirubin Total: 0.5 mg/dL (ref 0.0–1.2)
CO2: 24 mmol/L (ref 20–29)
Calcium: 9.3 mg/dL (ref 8.7–10.2)
Chloride: 101 mmol/L (ref 96–106)
Creatinine, Ser: 1.3 mg/dL — ABNORMAL HIGH (ref 0.76–1.27)
GFR calc Af Amer: 73 mL/min/1.73
GFR calc non Af Amer: 63 mL/min/1.73
Globulin, Total: 4 g/dL (ref 1.5–4.5)
Glucose: 128 mg/dL — ABNORMAL HIGH (ref 65–99)
Potassium: 3.2 mmol/L — ABNORMAL LOW (ref 3.5–5.2)
Sodium: 139 mmol/L (ref 134–144)
Total Protein: 7.7 g/dL (ref 6.0–8.5)

## 2017-09-25 LAB — LIPASE: Lipase: 70 U/L (ref 13–78)

## 2017-09-25 LAB — URIC ACID: Uric Acid: 9.3 mg/dL — ABNORMAL HIGH (ref 3.7–8.6)

## 2017-09-25 NOTE — Telephone Encounter (Signed)
Okay for you to type letter that patient can be out of work until 09/30/17 and I will sign

## 2017-09-25 NOTE — Telephone Encounter (Signed)
Patient called called, verified DOB and was informed that his letter was ready for pick up. Patient verbalized understanding and had no further questions.

## 2017-09-25 NOTE — Telephone Encounter (Signed)
Patient verified DOB, patient was informed of most recent lab results results and verbalized understanding. Patient did have a question, patient requested for a letter to be written out of work until 09/30/17 due to his stomach problems.

## 2017-09-25 NOTE — Telephone Encounter (Signed)
-----   Message from Terry Blackbird, MD sent at 09/24/2017  7:54 PM EDT ----- Please notify patient that his chest x-ray showed no significant abnormalities

## 2017-09-25 NOTE — Telephone Encounter (Signed)
Pt called stating he needs an extension on his "back to work" letter because his stomach pain has not gone away, he feels that he can go back on 09/30/2017.please follow up as soon as possible if this is an option

## 2017-09-26 LAB — H. PYLORI BREATH TEST: H pylori Breath Test: NEGATIVE

## 2017-09-28 ENCOUNTER — Telehealth: Payer: Self-pay

## 2017-09-28 DIAGNOSIS — R197 Diarrhea, unspecified: Secondary | ICD-10-CM

## 2017-09-28 MED ORDER — METRONIDAZOLE 500 MG PO TABS: 500.0000 mg | ORAL_TABLET | Freq: Three times a day (TID) | ORAL | 0 refills | Status: DC

## 2017-09-28 NOTE — Telephone Encounter (Signed)
Please let patient know that the prescription for metronidazole has been sent into the pharmacy. I will have to research whether or not fish oil pills help with uric acid/gout

## 2017-09-28 NOTE — Telephone Encounter (Signed)
-----   Message from Antony Blackbird, MD sent at 09/25/2017  5:33 PM EDT ----- Notify patient that his white blood cell count was slightly elevated on his recent blood work and this can indicate the presence of infection. If he is still having diarrhea then I would like for him to start an antibiotic called metronidazole to take twice per day for 7 days-please find out to which pharmacy he would like to have this medication sent. Also, his blood work did show that his uric acid level is elevated and this would be consistent with gout. Does he want the medication for gout, allopurinol, refilled which he was prescribed at his recent discharge from the hospital.

## 2017-09-28 NOTE — Telephone Encounter (Signed)
Patient was called and verified DOB patient stated he would like the antibiotic sent to [facility] pharmacy. Also the patient stated that another dr informed him he could use fish oil pills for the uric acid and wanted your opinion. Please fu at your earliest convenience.

## 2017-09-29 ENCOUNTER — Telehealth: Payer: Self-pay | Admitting: Family Medicine

## 2017-09-29 MED FILL — metroNIDAZOLE 500 MG TABS: 500 | 7 days supply | Qty: 21 | Fill #0

## 2017-09-29 NOTE — Telephone Encounter (Signed)
Patient called requesting antibiotic medication stating it was for his diarrhea (ondansetron). States he never received it.

## 2017-09-29 NOTE — Telephone Encounter (Signed)
The medication that was sent to his pharmacy for the diarrhea was an antibiotic called metronidazole. Please check with his pharmacy to make sure that this was received. The medication that the patient is mentioning in his note is Zofran which is for nausea. Patient should also have immodium(loperimide) at home to take for his diarrhea. Would he like Zofran sent into his pharmacy for nausea?

## 2017-09-29 NOTE — Telephone Encounter (Signed)
Patient was called and no answer. If the patient calls back please inform the patient The medication that was sent to his pharmacy for the diarrhea was an antibiotic called metronidazole. Please check with his pharmacy to make sure that this was received. The medication that the patient is mentioning in his note is Zofran which is for nausea. Patient should also have immodium(loperimide) at home to take for his diarrhea. Would he like Zofran sent into his pharmacy for nausea?

## 2017-09-30 ENCOUNTER — Other Ambulatory Visit: Payer: Self-pay | Admitting: Family Medicine

## 2017-09-30 DIAGNOSIS — M62838 Other muscle spasm: Secondary | ICD-10-CM

## 2017-09-30 MED FILL — $Viagra 50mg tablet: 50 | 30 days supply | Qty: 10 | Fill #2

## 2017-10-01 ENCOUNTER — Emergency Department (HOSPITAL_COMMUNITY)
Admission: EM | Admit: 2017-10-01 | Discharge: 2017-10-02 | Disposition: A | Discharge status: Home / Self Care | Payer: Self-pay | Attending: Emergency Medicine | Admitting: Emergency Medicine

## 2017-10-01 ENCOUNTER — Encounter (HOSPITAL_COMMUNITY): Payer: Self-pay | Admitting: Emergency Medicine

## 2017-10-01 DIAGNOSIS — E119 Type 2 diabetes mellitus without complications: Secondary | ICD-10-CM | POA: Insufficient documentation

## 2017-10-01 DIAGNOSIS — Z79899 Other long term (current) drug therapy: Secondary | ICD-10-CM | POA: Insufficient documentation

## 2017-10-01 DIAGNOSIS — R600 Localized edema: Secondary | ICD-10-CM | POA: Insufficient documentation

## 2017-10-01 DIAGNOSIS — R609 Edema, unspecified: Secondary | ICD-10-CM

## 2017-10-01 DIAGNOSIS — I10 Essential (primary) hypertension: Secondary | ICD-10-CM | POA: Insufficient documentation

## 2017-10-01 DIAGNOSIS — F17210 Nicotine dependence, cigarettes, uncomplicated: Secondary | ICD-10-CM | POA: Insufficient documentation

## 2017-10-01 LAB — CBC
HCT: 36.1 % — ABNORMAL LOW (ref 39.0–52.0)
Hemoglobin: 11.5 g/dL — ABNORMAL LOW (ref 13.0–17.0)
MCH: 28 pg (ref 26.0–34.0)
MCHC: 31.9 g/dL (ref 30.0–36.0)
MCV: 87.8 fL (ref 78.0–100.0)
PLATELETS: 184 10*3/uL (ref 150–400)
RBC: 4.11 MIL/uL — ABNORMAL LOW (ref 4.22–5.81)
RDW: 14.8 % (ref 11.5–15.5)
WBC: 12 10*3/uL — ABNORMAL HIGH (ref 4.0–10.5)

## 2017-10-01 LAB — COMPREHENSIVE METABOLIC PANEL
ALT: 25 U/L (ref 0–44)
ANION GAP: 9 (ref 5–15)
AST: 19 U/L (ref 15–41)
Albumin: 3 g/dL — ABNORMAL LOW (ref 3.5–5.0)
Alkaline Phosphatase: 129 U/L — ABNORMAL HIGH (ref 38–126)
BUN: 5 mg/dL — ABNORMAL LOW (ref 6–20)
CHLORIDE: 106 mmol/L (ref 98–111)
CO2: 25 mmol/L (ref 22–32)
CREATININE: 1.15 mg/dL (ref 0.61–1.24)
Calcium: 8.4 mg/dL — ABNORMAL LOW (ref 8.9–10.3)
Glucose, Bld: 162 mg/dL — ABNORMAL HIGH (ref 70–99)
Potassium: 3.4 mmol/L — ABNORMAL LOW (ref 3.5–5.1)
Sodium: 140 mmol/L (ref 135–145)
Total Bilirubin: 0.8 mg/dL (ref 0.3–1.2)
Total Protein: 8 g/dL (ref 6.5–8.1)

## 2017-10-01 NOTE — ED Triage Notes (Signed)
Pt reports his right knee and lower leg is continuing to swell and cause him pain, now reports his left leg is beginning to swell.  Pt has good pulses and warm.

## 2017-10-02 ENCOUNTER — Other Ambulatory Visit: Payer: Self-pay

## 2017-10-02 ENCOUNTER — Emergency Department (HOSPITAL_COMMUNITY)
Admit: 2017-10-02 | Discharge: 2017-10-02 | Disposition: A | Discharge status: Home / Self Care | Payer: Self-pay | Attending: Family Medicine | Admitting: Family Medicine

## 2017-10-02 ENCOUNTER — Encounter (HOSPITAL_COMMUNITY): Payer: Self-pay | Admitting: *Deleted

## 2017-10-02 ENCOUNTER — Emergency Department (HOSPITAL_COMMUNITY): Payer: Self-pay

## 2017-10-02 ENCOUNTER — Inpatient Hospital Stay (HOSPITAL_COMMUNITY)
Admission: EM | Admit: 2017-10-02 | Discharge: 2017-10-06 | DRG: 175 | Disposition: A | Discharge status: Home / Self Care | Payer: Self-pay | Attending: Internal Medicine | Admitting: Internal Medicine

## 2017-10-02 DIAGNOSIS — I10 Essential (primary) hypertension: Secondary | ICD-10-CM | POA: Diagnosis present

## 2017-10-02 DIAGNOSIS — E876 Hypokalemia: Secondary | ICD-10-CM | POA: Diagnosis not present

## 2017-10-02 DIAGNOSIS — Z79899 Other long term (current) drug therapy: Secondary | ICD-10-CM

## 2017-10-02 DIAGNOSIS — E119 Type 2 diabetes mellitus without complications: Secondary | ICD-10-CM | POA: Diagnosis present

## 2017-10-02 DIAGNOSIS — E785 Hyperlipidemia, unspecified: Secondary | ICD-10-CM | POA: Diagnosis present

## 2017-10-02 DIAGNOSIS — Z885 Allergy status to narcotic agent status: Secondary | ICD-10-CM

## 2017-10-02 DIAGNOSIS — M79609 Pain in unspecified limb: Secondary | ICD-10-CM

## 2017-10-02 DIAGNOSIS — K859 Acute pancreatitis without necrosis or infection, unspecified: Secondary | ICD-10-CM | POA: Diagnosis present

## 2017-10-02 DIAGNOSIS — R609 Edema, unspecified: Secondary | ICD-10-CM

## 2017-10-02 DIAGNOSIS — I2699 Other pulmonary embolism without acute cor pulmonale: Principal | ICD-10-CM | POA: Diagnosis present

## 2017-10-02 DIAGNOSIS — I82411 Acute embolism and thrombosis of right femoral vein: Secondary | ICD-10-CM | POA: Diagnosis present

## 2017-10-02 DIAGNOSIS — D649 Anemia, unspecified: Secondary | ICD-10-CM | POA: Diagnosis not present

## 2017-10-02 DIAGNOSIS — N179 Acute kidney failure, unspecified: Secondary | ICD-10-CM | POA: Diagnosis present

## 2017-10-02 DIAGNOSIS — M7989 Other specified soft tissue disorders: Secondary | ICD-10-CM

## 2017-10-02 DIAGNOSIS — F17210 Nicotine dependence, cigarettes, uncomplicated: Secondary | ICD-10-CM | POA: Diagnosis present

## 2017-10-02 DIAGNOSIS — M109 Gout, unspecified: Secondary | ICD-10-CM | POA: Diagnosis present

## 2017-10-02 LAB — HEPARIN LEVEL (UNFRACTIONATED): HEPARIN UNFRACTIONATED: 0.28 [IU]/mL — AB (ref 0.30–0.70)

## 2017-10-02 LAB — BRAIN NATRIURETIC PEPTIDE: B NATRIURETIC PEPTIDE 5: 27.8 pg/mL (ref 0.0–100.0)

## 2017-10-02 LAB — LIPASE, BLOOD: Lipase: 25 U/L (ref 11–51)

## 2017-10-02 LAB — PROTIME-INR
INR: 1.22
PROTHROMBIN TIME: 15.3 s — AB (ref 11.4–15.2)

## 2017-10-02 LAB — ANTITHROMBIN III: AntiThromb III Func: 91 % (ref 75–120)

## 2017-10-02 LAB — APTT: APTT: 28 s (ref 24–36)

## 2017-10-02 MED ORDER — ONDANSETRON HCL 4 MG PO TABS: 4.0000 mg | ORAL_TABLET | Freq: Four times a day (QID) | ORAL | Status: DC | PRN

## 2017-10-02 MED ORDER — IOPAMIDOL (ISOVUE-370) INJECTION 76%
100.0000 mL | Freq: Once | INTRAVENOUS | Status: AC | PRN
  Administered 2017-10-02: 100 mL via INTRAVENOUS

## 2017-10-02 MED ORDER — IOPAMIDOL (ISOVUE-370) INJECTION 76%
INTRAVENOUS | Status: AC
  Filled 2017-10-02: qty 100

## 2017-10-02 MED ORDER — ONDANSETRON HCL 4 MG/2ML IJ SOLN: 4.0000 mg | Freq: Four times a day (QID) | INTRAMUSCULAR | Status: DC | PRN

## 2017-10-02 MED ORDER — PANTOPRAZOLE SODIUM 40 MG PO TBEC
40.0000 mg | DELAYED_RELEASE_TABLET | Freq: Two times a day (BID) | ORAL | Status: DC
  Administered 2017-10-02 – 2017-10-06 (×8): 40 mg via ORAL
  Filled 2017-10-02 (×9): qty 1

## 2017-10-02 MED ORDER — POTASSIUM CHLORIDE CRYS ER 20 MEQ PO TBCR
40.0000 meq | EXTENDED_RELEASE_TABLET | Freq: Once | ORAL | Status: AC
  Administered 2017-10-02: 40 meq via ORAL
  Filled 2017-10-02: qty 2

## 2017-10-02 MED ORDER — HEPARIN BOLUS VIA INFUSION
3000.0000 [IU] | Freq: Once | INTRAVENOUS | Status: AC
  Administered 2017-10-02: 3000 [IU] via INTRAVENOUS
  Filled 2017-10-02: qty 3000

## 2017-10-02 MED ORDER — SODIUM CHLORIDE 0.9 % IV SOLN
INTRAVENOUS | Status: DC
  Administered 2017-10-02 – 2017-10-03 (×2): via INTRAVENOUS

## 2017-10-02 MED ORDER — HEPARIN (PORCINE) IN NACL 100-0.45 UNIT/ML-% IJ SOLN
1700.0000 [IU]/h | INTRAMUSCULAR | Status: AC
  Administered 2017-10-02: 1550 [IU]/h via INTRAVENOUS
  Administered 2017-10-02 – 2017-10-06 (×6): 1700 [IU]/h via INTRAVENOUS
  Filled 2017-10-02 (×7): qty 250

## 2017-10-02 MED ORDER — MORPHINE SULFATE (PF) 2 MG/ML IV SOLN
1.0000 mg | INTRAVENOUS | Status: DC | PRN
  Administered 2017-10-02 – 2017-10-03 (×3): 1 mg via INTRAVENOUS
  Filled 2017-10-02 (×3): qty 1

## 2017-10-02 MED ORDER — TRAMADOL HCL 50 MG PO TABS
50.0000 mg | ORAL_TABLET | Freq: Once | ORAL | Status: AC
  Administered 2017-10-02: 50 mg via ORAL
  Filled 2017-10-02: qty 1

## 2017-10-02 MED ORDER — NICOTINE 21 MG/24HR TD PT24
21.0000 mg | MEDICATED_PATCH | Freq: Every day | TRANSDERMAL | Status: DC
  Administered 2017-10-02 – 2017-10-06 (×5): 21 mg via TRANSDERMAL
  Filled 2017-10-02 (×5): qty 1

## 2017-10-02 MED FILL — CYCLOBENZAPRINE 10 MG TAB: 10 | 30 days supply | Qty: 60 | Fill #0

## 2017-10-02 NOTE — Consult Note (Signed)
Referring Physician: [facility]  Patient name: Terry Poole MRN: [id number] DOB: 06/28/1965 Sex: male  REASON FOR CONSULT: Right leg DVT  HPI: Terry Poole is a 52 y.o. male with 3 day history of sudden onset right leg swelling.  Pt was recently in hospital with renal failure thought to be secondary to NSAIDS.  This has recovered.  No prior DVT or recent travel.  CT shows PE also possible pancreatic process.  No history of hypercoag state.    Past Medical History:  Diagnosis Date  . Bronchitis   . Bronchitis   . Gout   . Hammertoe of second toe of right foot   . Smoker    Past Surgical History:  Procedure Laterality Date  . HAMMER TOE SURGERY Right 03/10/2016   Procedure: 2ND RIGHT HAMMER TOE CORRECTION;  Surgeon: Landis Martins, DPM;  Location: [place];  Service: Podiatry;  Laterality: Right;  . MASS EXCISION Right 03/10/2016   Procedure: EXCISION OF CALLUS 2ND RIGHT TOE;  Surgeon: Landis Martins, DPM;  Location: [place];  Service: Podiatry;  Laterality: Right;  . NO PAST SURGERIES      Family History  Problem Relation Age of Onset  . Heart disease Neg Hx   . Kidney disease Neg Hx     SOCIAL HISTORY: Social History   Socioeconomic History  . Marital status: Single    Spouse name: Not on file  . Number of children: Not on file  . Years of education: Not on file  . Highest education level: Not on file  Occupational History  . Not on file  Social Needs  . Financial resource strain: Not on file  . Food insecurity:    Worry: Not on file    Inability: Not on file  . Transportation needs:    Medical: Not on file    Non-medical: Not on file  Tobacco Use  . Smoking status: Current Every Day Smoker    Packs/day: 0.25    Types: Cigarettes  . Smokeless tobacco: Never Used  . Tobacco comment: 8 cigs daily  Substance and Sexual Activity  . Alcohol use: No  . Drug use: No  . Sexual activity: Not on file  Lifestyle  . Physical  activity:    Days per week: Not on file    Minutes per session: Not on file  . Stress: Not on file  Relationships  . Social connections:    Talks on phone: Not on file    Gets together: Not on file    Attends religious service: Not on file    Active member of club or organization: Not on file    Attends meetings of clubs or organizations: Not on file    Relationship status: Not on file  . Intimate partner violence:    Fear of current or ex partner: Not on file    Emotionally abused: Not on file    Physically abused: Not on file    Forced sexual activity: Not on file  Other Topics Concern  . Not on file  Social History Narrative  . Not on file    Allergies  Allergen Reactions  . Vicodin [Hydrocodone-Acetaminophen] Hives    Patient stated he is no longer allergic to vicodin, patient stated that he was allergic about 12 years ago.     Current Facility-Administered Medications  Medication Dose Route Frequency Provider Last Rate Last Dose  . 0.9 %  sodium chloride infusion   Intravenous  Continuous Rai, Ripudeep K, MD 75 mL/hr at 10/02/17 1444    . heparin ADULT infusion 100 units/mL (25000 units/256mL sodium chloride 0.45%)  1,550 Units/hr Intravenous Continuous Rai, Ripudeep K, MD 15.5 mL/hr at 10/02/17 1110 1,550 Units/hr at 10/02/17 1110  . iopamidol (ISOVUE-370) 76 % injection           . morphine 2 MG/ML injection 1 mg  1 mg Intravenous Q3H PRN Rai, Ripudeep K, MD      . nicotine (NICODERM CQ - dosed in mg/24 hours) patch 21 mg  21 mg Transdermal Daily Rai, Ripudeep K, MD   21 mg at 10/02/17 1200  . ondansetron (ZOFRAN) tablet 4 mg  4 mg Oral Q6H PRN Rai, Ripudeep K, MD       Or  . ondansetron (ZOFRAN) injection 4 mg  4 mg Intravenous Q6H PRN Rai, Ripudeep K, MD      . pantoprazole (PROTONIX) EC tablet 40 mg  40 mg Oral BID Rai, Ripudeep K, MD        ROS:   General:  No weight loss, Fever, chills  HEENT: No recent headaches, no nasal bleeding, no visual changes, no sore  throat  Neurologic: No dizziness, blackouts, seizures. No recent symptoms of stroke or mini- stroke. No recent episodes of slurred speech, or temporary blindness.  Cardiac: + recent episodes of chest pain/pressure, no shortness of breath at rest.  + shortness of breath with exertion.  Denies history of atrial fibrillation or irregular heartbeat  Vascular: No history of rest pain in feet.  No history of claudication.  No history of non-healing ulcer, No history of DVT   Pulmonary: No home oxygen, no productive cough, no hemoptysis,  No asthma or wheezing   Hematologic:No history of hypercoagulable state.  No history of easy bleeding.  No history of anemia  Gastrointestinal: No hematochezia or melena,  No gastroesophageal reflux, no trouble swallowing  Skin: No rashes  Psychological: No history of anxiety,  No history of depression   Physical Examination  Vitals:   10/02/17 1115 10/02/17 1200 10/02/17 1300 10/02/17 1435  BP:  (!) 136/93 137/82 (!) 137/93  Pulse: 81 76 76 73  Resp: (!) 25 16 16  (!) 22  Temp:    98.9 F (37.2 C)  TempSrc:    Oral  SpO2: 100% 100% 97% 100%    There is no height or weight on file to calculate BMI.  General:  Alert and oriented, no acute distress HEENT: Normal Neck: No bruit or JVD Pulmonary: Clear to auscultation bilaterally Cardiac: Regular Rate and Rhythm  Abdomen: Soft, non-tender, non-distended, no mass Skin: No rash Extremity Pulses:  2+ radial, brachial, femoral, dorsalis pedis, posterior tibial pulses bilaterally Musculoskeletal: No deformity right leg edema diffuse from knee to foot 20% larger than left  Neurologic: Upper and lower extremity motor 5/5 and symmetric  DATA:  Distal femoral and popliteal DVT  CTA bilateral lower lobe PE Possible pancreatic inflammtion vs malignancy   ASSESSMENT:  Acute DVT right distal femoral popliteal no iliac involvement with improving swelling   PLAN:  Since pt DVT symptoms are already  improving on heparin and no iliac vein involvement would not recommend thrombolysis as benefit does not outweigh risk.  Would go with standard anticoagulation for 6 months since he also has PE and no prior episode  Would also recommend full workup of possible malignancy raised on CT scan   Ruta Hinds, MD [facility]: [phone] Pager: [phone]

## 2017-10-02 NOTE — Progress Notes (Signed)
ANTICOAGULATION CONSULT NOTE - Initial Consult  Pharmacy Consult for Heparin Indication: Bilateral Pulmonary Embolism + RLE DVT  Allergies  Allergen Reactions  . Vicodin [Hydrocodone-Acetaminophen] Hives    Patient stated he is no longer allergic to vicodin, patient stated that he was allergic about 12 years ago.     Patient Measurements:   Heparin Dosing Weight: 91 kg  Vital Signs: Temp: 97.7 F (36.5 C) (08/30 0341) Temp Source: Oral (08/30 0341) BP: 196/117 (08/30 0853) Pulse Rate: 79 (08/30 0853)  Labs: Recent Labs    10/01/17 2152  HGB 11.5*  HCT 36.1*  PLT 184  CREATININE 1.15    Estimated Creatinine Clearance: 86.9 mL/min (by C-G formula based on SCr of 1.15 mg/dL).   Medical History: Past Medical History:  Diagnosis Date  . Bronchitis   . Bronchitis   . Gout   . Hammertoe of second toe of right foot   . Smoker     Medications:  No anticoagulation PTA  Assessment:  73yr male with c/o leg swelling  Dopplers = + acute RLE DVT involving distal femoral, popliteal, posterior tibial, peroneal and gastrocnemius veins  CTAngio = + bilateral PE  Pharmacy consulted to dose IV heparin  Goal of Therapy:  Heparin level 0.3-0.7 units/ml Monitor platelets by anticoagulation protocol: Yes   Plan:   Obtain baseline aPTT and PT/INR  Per [place] nomogram: Heparin 3000 unit IV bolus followed by heparin infusion @ 1550 units/hr  Check heparin level 6 hr after heparin started  Follow heparin level and CBC daily while on heparin  Caidence Kaseman, Toribio Harbour, PharmD 10/02/2017,10:50 AM

## 2017-10-02 NOTE — ED Notes (Signed)
Kassie Rn verified heparin administration

## 2017-10-02 NOTE — ED Notes (Signed)
[place] on 4E called and given report.  ED TO INPATIENT HANDOFF REPORT  Name/Age/Gender Terry Poole 52 y.o. male  Code Status Code Status History    Date Active Date Inactive Code Status Order ID Comments User Context   09/22/2017 0258 09/22/2017 2113 Full Code [id number]  Etta Quill, DO ED      Home/SNF/Other Home  Chief Complaint leg pain   Level of Care/Admitting Diagnosis ED Disposition    ED Disposition Condition [facility]: [place] [[id number]]  Level of Care: Stepdown [14]  Diagnosis: Pulmonary embolism ([place]) [124580]  Admitting Physician: RAI, Vernelle Emerald [[id number]]  Attending Physician: RAI, RIPUDEEP K [[id number]]  Estimated length of stay: past midnight tomorrow  Certification:: I certify this patient will need inpatient services for at least 2 midnights  PT Class (Do Not Modify): Inpatient [101]  PT Acc Code (Do Not Modify): Private [1]       Medical History Past Medical History:  Diagnosis Date  . Bronchitis   . Bronchitis   . Gout   . Hammertoe of second toe of right foot   . Smoker     Allergies Allergies  Allergen Reactions  . Vicodin [Hydrocodone-Acetaminophen] Hives    Patient stated he is no longer allergic to vicodin, patient stated that he was allergic about 12 years ago.     IV Location/Drains/Wounds Patient Lines/Drains/Airways Status   Active Line/Drains/Airways    Name:   Placement date:   Placement time:   Site:   Days:   Peripheral IV 10/02/17 Right Arm   10/02/17    0953    Arm   less than 1   Incision (Closed) 03/10/16 Foot Right   03/10/16    1354     571          Labs/Imaging Results for orders placed or performed during the hospital encounter of 10/02/17 (from the past 48 hour(s))  APTT     Status: None   Collection Time: 10/02/17 11:00 AM  Result Value Ref Range   aPTT 28 24 - 36 seconds    Comment: Performed at [facility], [place] [address], [place], [place] [address]   Protime-INR     Status: Abnormal   Collection Time: 10/02/17 11:00 AM  Result Value Ref Range   Prothrombin Time 15.3 (H) 11.4 - 15.2 seconds   INR 1.22     Comment: Performed at [facility], [place] [address], [place], [place] [address]  Lipase, blood     Status: None   Collection Time: 10/02/17 12:27 PM  Result Value Ref Range   Lipase 25 11 - 51 U/L    Comment: Performed at [facility], [place] [address] [place], [place] [address]   Ct Angio Chest Pe W/cm &/or Wo Cm  Result Date: 10/02/2017 CLINICAL DATA:  52 year old male with a history of DVT and leg swelling. EXAM: CT ANGIOGRAPHY CHEST WITH CONTRAST TECHNIQUE: Multidetector CT imaging of the chest was performed using the standard protocol during bolus administration of intravenous contrast. Multiplanar CT image reconstructions and MIPs were obtained to evaluate the vascular anatomy. CONTRAST:  168mL ISOVUE-370 IOPAMIDOL (ISOVUE-370) INJECTION 76% COMPARISON:  CT 06/21/2017 FINDINGS: Cardiovascular: Heart: No cardiomegaly. No pericardial fluid/thickening. No significant coronary calcifications. There is no measured inversion of the RV/LV ratio, with no septal inversion. No evidence of right-sided heart strain by CT imaging. Aorta: Unremarkable course, caliber, contour of the thoracic aorta. No aneurysm  or dissection flap. No periaortic fluid. Pulmonary arteries: Central filling defects of the bilateral lower lobes involving segmental and subsegmental vessels. No filling defects of the lobar or the main pulmonary artery. Mediastinum/Nodes: No mediastinal adenopathy. Unremarkable appearance of the thoracic esophagus. Unremarkable appearance of the thoracic inlet and thyroid. Lungs/Pleura: Central airways are clear. No pleural effusion. No confluent airspace disease. No pneumothorax. Upper Abdomen: Vague inflammatory changes/edema of the left upper quadrant at the hilum of the spleen and involving distal pancreas.  Multiple lymph nodes are present at this location. Musculoskeletal: Degenerative changes of the spine. No acute displaced fracture. Review of the MIP images confirms the above findings. IMPRESSION: CT positive for bilateral pulmonary emboli involving lower lobe segmental and subsegmental branches. Inflammatory changes of the left upper quadrant at the hilum of the spleen and involving distal pancreas. This may reflect acute pancreatitis, however, given the presence of pulmonary emboli, malignancy is also considered. Further evaluation with contrast-enhanced abdominal CT recommended. These results were called by telephone at the time of interpretation on 10/02/2017 at 10:34 am to Dr. Dalia Heading , who verbally acknowledged these results. Electronically Signed   By: Corrie Mckusick D.O.   On: 10/02/2017 10:35    Pending Labs Unresulted Labs (From admission, onward)    Start     Ordered   10/03/17 0500  CBC  Daily,   R     10/02/17 1056   10/02/17 1700  Heparin level (unfractionated)  Once-Timed,   R     10/02/17 1056   10/02/17 1121  Antithrombin III  (Hypercoagulable Panel, Comprehensive (PNL))  Once,   R     10/02/17 1120   10/02/17 1121  Protein C activity  (Hypercoagulable Panel, Comprehensive (PNL))  Once,   R     10/02/17 1120   10/02/17 1121  Protein C, total  (Hypercoagulable Panel, Comprehensive (PNL))  Once,   R     10/02/17 1120   10/02/17 1121  Protein S activity  (Hypercoagulable Panel, Comprehensive (PNL))  Once,   R     10/02/17 1120   10/02/17 1121  Protein S, total  (Hypercoagulable Panel, Comprehensive (PNL))  Once,   R     10/02/17 1120   10/02/17 1121  Lupus anticoagulant panel  (Hypercoagulable Panel, Comprehensive (PNL))  Once,   R     10/02/17 1120   10/02/17 1121  Beta-2-glycoprotein i abs, IgG/M/A  (Hypercoagulable Panel, Comprehensive (PNL))  Once,   R     10/02/17 1120   10/02/17 1121  Homocysteine, serum  (Hypercoagulable Panel, Comprehensive (PNL))  Once,   R      10/02/17 1120   10/02/17 1121  Factor 5 leiden  (Hypercoagulable Panel, Comprehensive (PNL))  Once,   R     10/02/17 1120   10/02/17 1121  Prothrombin gene mutation  (Hypercoagulable Panel, Comprehensive (PNL))  Once,   R     10/02/17 1120   10/02/17 1121  Cardiolipin antibodies, IgG, IgM, IgA  (Hypercoagulable Panel, Comprehensive (PNL))  Once,   R     10/02/17 1120   Signed and Held  Basic metabolic panel  Tomorrow morning,   R     Signed and Held   Signed and Held  CBC  Tomorrow morning,   R     Signed and Held          Vitals/Pain Today's Vitals   10/02/17 1100 10/02/17 1115 10/02/17 1200 10/02/17 1300  BP: (!) 128/106  (!) 136/93 137/82  Pulse: 78 81 76 76  Resp: 18 (!) 25 16 16   Temp:      TempSrc:      SpO2: 100% 100% 100% 97%  PainSc:        Isolation Precautions No active isolations  Medications Medications  iopamidol (ISOVUE-370) 76 % injection (has no administration in time range)  heparin bolus via infusion 3,000 Units (3,000 Units Intravenous Bolus from Bag 10/02/17 1111)    Followed by  heparin ADULT infusion 100 units/mL (25000 units/242mL sodium chloride 0.45%) (1,550 Units/hr Intravenous New Bag/Given 10/02/17 1110)  nicotine (NICODERM CQ - dosed in mg/24 hours) patch 21 mg (21 mg Transdermal Patch Applied 10/02/17 1200)  traMADol (ULTRAM) tablet 50 mg (50 mg Oral Given 10/02/17 0809)  iopamidol (ISOVUE-370) 76 % injection 100 mL (100 mLs Intravenous Contrast Given 10/02/17 0956)  potassium chloride SA (K-DUR,KLOR-CON) CR tablet 40 mEq (40 mEq Oral Given 10/02/17 1200)    Mobility walks

## 2017-10-02 NOTE — Progress Notes (Signed)
ANTICOAGULATION CONSULT NOTE  Pharmacy Consult for Heparin Indication: Bilateral Pulmonary Embolism + RLE DVT  Allergies  Allergen Reactions  . Vicodin [Hydrocodone-Acetaminophen] Hives    Patient stated he is no longer allergic to vicodin, patient stated that he was allergic about 12 years ago.     Patient Measurements:   Heparin Dosing Weight: 91 kg  Vital Signs: Temp: 98.9 F (37.2 C) (08/30 1435) Temp Source: Oral (08/30 1435) BP: 137/93 (08/30 1435) Pulse Rate: 73 (08/30 1435)  Labs: Recent Labs    10/01/17 2152 10/02/17 1100 10/02/17 1652  HGB 11.5*  --   --   HCT 36.1*  --   --   PLT 184  --   --   APTT  --  28  --   LABPROT  --  15.3*  --   INR  --  1.22  --   HEPARINUNFRC  --   --  0.28*  CREATININE 1.15  --   --     Estimated Creatinine Clearance: 86.9 mL/min (by C-G formula based on SCr of 1.15 mg/dL).   Medical History: Past Medical History:  Diagnosis Date  . Bronchitis   . Bronchitis   . Gout   . Hammertoe of second toe of right foot   . Smoker     Medications:  No anticoagulation PTA  Assessment:  10yr male with c/o leg swelling  Dopplers = + acute RLE DVT involving distal femoral, popliteal, posterior tibial, peroneal and gastrocnemius veins  CTAngio = + bilateral PE  Pharmacy consulted to dose IV heparin  Initial heparin level just slightly below goal.  No overt bleeding or complications noted.  Goal of Therapy:  Heparin level 0.3-0.7 units/ml Monitor platelets by anticoagulation protocol: Yes   Plan:  Increase IV Heparin to 1700 units/hr. Recheck heparin level in 6 hrs. Daily heparin level and CBC. F/u plans for oral anticoagulation eventually.  Marguerite Olea, [facility] Clinical Pharmacist Phone [phone]  10/02/2017 6:29 PM

## 2017-10-02 NOTE — ED Notes (Signed)
Patient out of ED with [facility] in no distress.

## 2017-10-02 NOTE — Discharge Instructions (Signed)
Use compression stockings and elevate your legs when resting.

## 2017-10-02 NOTE — H&P (Signed)
History and Physical        Hospital Admission Note Date: 10/02/2017  Patient name: Terry Poole Medical record number: [id number] Date of birth: 30-Nov-1965 Age: 52 y.o. Gender: male  PCP: [name], MD    Patient coming from: home   I have reviewed all records in the [facility].    Chief Complaint:  Lower leg swelling worse in the last 3 days  HPI: Patient is a 52 year old male with history of hypertension, hyperlipidemia, who was recently discharged on 8/20 after treated for AKI due to recent NSAID use, abdominal pain.  Patient reported that he has several months history of swelling in his legs, progressively worse in the last 3 days.  Patient reported that he initially thought it was gout then had also used tight socks (not compression stockings) with no significant improvement.  Yesterday evening when he was with his friend at [facility], he got alarmed with significant swelling in his right leg and was recommended by his friend to get checked out. Otherwise denied any fevers, chills, productive cough, no significant shortness of breath however he feels some chest heaviness on lying down.  Denied any personal history or family history of blood clots.  Denies any recent long distance travels or air flights, he did go on a bus ride to [facility] few months ago.  ED work-up/course:  Temp 98.2, respiratory rate 18, pulse 80, BP 146/86, O2 sats 100% on room air Doppler ultrasound of the lower extremity showed acute DVT involving distal femoral, popliteal, posterior tibial, peroneal veins and gastrinomas veins CT angiogram of chest positive for bilateral PE  Review of Systems: Positives marked in 'bold' Constitutional: Denies fever, chills, diaphoresis, poor appetite and fatigue.  HEENT: Denies photophobia, eye pain, redness, hearing loss, ear pain, congestion,  sore throat, rhinorrhea, sneezing, mouth sores, trouble swallowing, neck pain, neck stiffness and tinnitus.   Respiratory: Denies cough, chest tightness,  and wheezing.   Cardiovascular: Denies chest pain, palpitations.  Please see HPI Gastrointestinal: Denies nausea, vomiting, abdominal pain, diarrhea, constipation, blood in stool and abdominal distention.  Genitourinary: Denies dysuria, urgency, frequency, hematuria, flank pain and difficulty urinating.  Musculoskeletal: Denies myalgias, back pain, joint swelling, arthralgias and gait problem.  Skin: Denies pallor, rash and wound.  Neurological: Denies dizziness, seizures, syncope, weakness, light-headedness, numbness and headaches.  Hematological: Denies adenopathy. Easy bruising, personal or family bleeding history  Psychiatric/Behavioral: Denies suicidal ideation, mood changes, confusion, nervousness, sleep disturbance and agitation  Past Medical History: Past Medical History:  Diagnosis Date  . Bronchitis   . Bronchitis   . Gout   . Hammertoe of second toe of right foot   . Smoker     Past Surgical History:  Procedure Laterality Date  . HAMMER TOE SURGERY Right 03/10/2016   Procedure: 2ND RIGHT HAMMER TOE CORRECTION;  Surgeon: Landis Martins, DPM;  Location: [facility];  Service: Podiatry;  Laterality: Right;  . MASS EXCISION Right 03/10/2016   Procedure: EXCISION OF CALLUS 2ND RIGHT TOE;  Surgeon: Landis Martins, DPM;  Location: [facility];  Service: Podiatry;  Laterality: Right;  . NO PAST SURGERIES      Medications: Prior  to Admission medications   Medication Sig Start Date End Date Taking? Authorizing Provider  loperamide (IMODIUM) 2 MG capsule Take 1 capsule (2 mg total) by mouth every 6 (six) hours as needed for diarrhea or loose stools. 09/22/17  Yes Thurnell Lose, MD  metroNIDAZOLE (FLAGYL) 500 MG tablet Take 1 tablet (500 mg total) by mouth 3 (three) times daily. 09/28/17  Yes Fulp, Cammie,  MD  pantoprazole (PROTONIX) 40 MG tablet Take 1 tablet (40 mg total) by mouth 2 (two) times daily. 09/22/17  Yes Thurnell Lose, MD  allopurinol (ZYLOPRIM) 300 MG tablet Take 1 tablet (300 mg total) by mouth 2 (two) times daily. Patient not taking: Reported on 09/22/2017 06/01/17   [name], MD  atorvastatin (LIPITOR) 20 MG tablet Take 1 tablet (20 mg total) by mouth daily. Patient not taking: Reported on 09/22/2017 06/01/17   [name], MD  cyclobenzaprine (FLEXERIL) 10 MG tablet TAKE 1 TABLET BY MOUTH 2 TIMES DAILY AS NEEDED FOR MUSCLE SPASMS. 10/02/17   [name], MD  sildenafil (VIAGRA) 50 MG tablet Take 1 tablet (50 mg total) by mouth daily as needed for erectile dysfunction. At least 24 hours between doses Patient not taking: Reported on 09/22/2017 07/21/17   [name], MD    Allergies:   Allergies  Allergen Reactions  . Vicodin [Hydrocodone-Acetaminophen] Hives    Patient stated he is no longer allergic to vicodin, patient stated that he was allergic about 12 years ago.     Social History:  reports that he has been smoking cigarettes. He has been smoking about 0.25 packs per day. He has never used smokeless tobacco. He reports that he does not drink alcohol or use drugs.  Family History: Family History  Problem Relation Age of Onset  . Heart disease Neg Hx   . Kidney disease Neg Hx   Per patient no history of blood clots or malignancy in his family.  Physical Exam: Blood pressure (!) 128/106, pulse 78, temperature 97.7 F (36.5 C), temperature source Oral, resp. rate 18, SpO2 100 %. General: Alert, awake, oriented x3, in no acute distress. Eyes: pink conjunctiva,anicteric sclera, pupils equal and reactive to light and accomodation, HEENT: normocephalic, atraumatic, oropharynx clear Neck: supple, no masses or lymphadenopathy, no goiter, no bruits, no JVD CVS: Regular rate and rhythm, without murmurs, rubs or gallops. No lower extremity edema Resp : Clear  to auscultation bilaterally, no wheezing, rales or rhonchi. GI : Soft, nontender, nondistended, positive bowel sounds, no masses. No hepatomegaly. No hernia.  Musculoskeletal: No clubbing or cyanosis, positive pedal pulses. No contracture. ROM intact  Neuro: Grossly intact, no focal neurological deficits, strength 5/5 upper and lower extremities bilaterally Psych: alert and oriented x 3, normal mood and affect Skin: no rashes or lesions, warm and dry   LABS on Admission: I have personally reviewed all the labs and imagings below    Basic Metabolic Panel: Recent Labs  Lab 10/01/17 2152  NA 140  K 3.4*  CL 106  CO2 25  GLUCOSE 162*  BUN 5*  CREATININE 1.15  CALCIUM 8.4*   Liver Function Tests: Recent Labs  Lab 10/01/17 2152  AST 19  ALT 25  ALKPHOS 129*  BILITOT 0.8  PROT 8.0  ALBUMIN 3.0*   No results for input(s): LIPASE, AMYLASE in the last 168 hours. No results for input(s): AMMONIA in the last 168 hours. CBC: Recent Labs  Lab 10/01/17 2152  WBC 12.0*  HGB 11.5*  HCT 36.1*  MCV 87.8  PLT 184   Cardiac Enzymes: No results for input(s): CKTOTAL, CKMB, CKMBINDEX, TROPONINI in the last 168 hours. BNP: Invalid input(s): POCBNP CBG: No results for input(s): GLUCAP in the last 168 hours.  Radiological Exams on Admission:  Ct Angio Chest Pe W/cm &/or Wo Cm  Result Date: 10/02/2017 CLINICAL DATA:  52 year old male with a history of DVT and leg swelling. EXAM: CT ANGIOGRAPHY CHEST WITH CONTRAST TECHNIQUE: Multidetector CT imaging of the chest was performed using the standard protocol during bolus administration of intravenous contrast. Multiplanar CT image reconstructions and MIPs were obtained to evaluate the vascular anatomy. CONTRAST:  166mL ISOVUE-370 IOPAMIDOL (ISOVUE-370) INJECTION 76% COMPARISON:  CT 06/21/2017 FINDINGS: Cardiovascular: Heart: No cardiomegaly. No pericardial fluid/thickening. No significant coronary calcifications. There is no measured inversion  of the RV/LV ratio, with no septal inversion. No evidence of right-sided heart strain by CT imaging. Aorta: Unremarkable course, caliber, contour of the thoracic aorta. No aneurysm or dissection flap. No periaortic fluid. Pulmonary arteries: Central filling defects of the bilateral lower lobes involving segmental and subsegmental vessels. No filling defects of the lobar or the main pulmonary artery. Mediastinum/Nodes: No mediastinal adenopathy. Unremarkable appearance of the thoracic esophagus. Unremarkable appearance of the thoracic inlet and thyroid. Lungs/Pleura: Central airways are clear. No pleural effusion. No confluent airspace disease. No pneumothorax. Upper Abdomen: Vague inflammatory changes/edema of the left upper quadrant at the hilum of the spleen and involving distal pancreas. Multiple lymph nodes are present at this location. Musculoskeletal: Degenerative changes of the spine. No acute displaced fracture. Review of the MIP images confirms the above findings. IMPRESSION: CT positive for bilateral pulmonary emboli involving lower lobe segmental and subsegmental branches. Inflammatory changes of the left upper quadrant at the hilum of the spleen and involving distal pancreas. This may reflect acute pancreatitis, however, given the presence of pulmonary emboli, malignancy is also considered. Further evaluation with contrast-enhanced abdominal CT recommended. These results were called by telephone at the time of interpretation on 10/02/2017 at 10:34 am to Dr. Dalia Heading , who verbally acknowledged these results. Electronically Signed   By: Corrie Mckusick D.O.   On: 10/02/2017 10:35      EKG: Independently reviewed.  Rate 92, normal sinus rhythm   Assessment/Plan Principal problem Bilateral acute pulmonary embolism (HCC) with acute RLE DVT -Currently hemodynamically stable, placed on IV heparin.  No clear etiology of extensive DVT and PE -CT angiogram showed bilateral PE involving lower  lobe, segmental and subsegmental branches with inflammatory changes in the left upper quadrant, involving the distal pancreas reflecting acute pancreatitis versus malignancy -Obtain hypercoagulable panel, CT abdomen pelvis for further evaluation of pancreas or any other abdominal pathology. -Obtain 2D echocardiogram -Vascular surgery consulted, discussed with Dr. Oneida Alar, recommended to transfer patient to [facility], may need to thrombolysis, will evaluate patient on arrival  Active problems Epigastric Abdominal pain:  - obtain lipase, LFT's normal, states has been having vague abdominal pain for last several months, has been taking PPI.  States that he quit drinking several years ago. -Obtain CT abdomen pelvis for further work-up  Nicotine use -Counseled patient on smoking cessation, placed on nicotine patch  DVT prophylaxis: Heparin drip  CODE STATUS: Full code  Consults called: Vascular surgery  Family Communication: Admission, patients condition and plan of care including tests being ordered have been discussed with the patient  who indicates understanding and agree with the plan and Code Status  Admission status: Inpatient, stepdown to [facility].  Signout given to Dr. [place] Crane.  Disposition plan: Further plan will depend as patient's clinical course evolves and further radiologic and laboratory data become available.    At the time of admission, it appears that the appropriate admission status for this patient is INPATIENT . This is judged to be reasonable and necessary in order to provide the required intensity of service to ensure the patient's safety given the presenting symptoms acute lower extremity DVT with acute bilateral PE, physical exam findings, and initial radiographic and laboratory data in the context of their chronic comorbidities.  The medical decision making on this patient was of high complexity and the patient is at high risk for clinical deterioration,  therefore this is a level 3 visit.*     Time Spent on Admission: 51mins     Ripudeep Rai M.D. [facility] 10/02/2017, 11:14 AM Pager: [phone]  If 7PM-7AM, please contact night-coverage [email or web address] Password [email or web address]

## 2017-10-02 NOTE — ED Provider Notes (Signed)
[facility] DEPT Provider Note   CSN: [id number] Arrival date & time: 10/02/17  0325     History   Chief Complaint Chief Complaint  Patient presents with  . Leg Swelling    HPI Terry Poole is a 52 y.o. male.  HPI Patient presents to the emergency department with increased swelling and pain in the right leg over the last 2 days.  The patient states he is also noted some swelling in his left leg.  The patient states he had swelling in his legs previously but this seems different.  The patient states that he did not take any medications prior to arrival for symptoms.  He states he was recently admitted to the hospital due to some kidney impairment which they feel like was due to NSAID use.  The patient states that nothing seems to make the condition better or worse.  The patient denies chest pain, shortness of breath, headache,blurred vision, neck pain, fever, cough, weakness, numbness, dizziness, anorexia, edema, abdominal pain, nausea, vomiting, diarrhea, rash, back pain, dysuria, hematemesis, bloody stool, near syncope, or syncope. Past Medical History:  Diagnosis Date  . Bronchitis   . Bronchitis   . Gout   . Hammertoe of second toe of right foot   . Smoker     Patient Active Problem List   Diagnosis Date Noted  . AKI (acute kidney injury) ([place]) 09/22/2017  . Chest pain 09/22/2017  . Hypertension 11/19/2016  . Type 2 diabetes mellitus ([place]) 08/19/2016  . Hyperlipidemia 08/19/2016  . Back pain 05/13/2016  . Hammertoe of second toe of right foot 03/06/2016  . Onychomycosis 04/16/2015  . 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 Gout 03/28/2015    Past Surgical History:  Procedure Laterality Date  . HAMMER TOE SURGERY Right 03/10/2016   Procedure: 2ND RIGHT HAMMER TOE CORRECTION;  Surgeon: Landis Martins, DPM;  Location: [facility];  Service: Podiatry;  Laterality: Right;  . MASS EXCISION Right 03/10/2016   Procedure: EXCISION OF  CALLUS 2ND RIGHT TOE;  Surgeon: Landis Martins, DPM;  Location: [facility];  Service: Podiatry;  Laterality: Right;  . NO PAST SURGERIES          Home Medications    Prior to Admission medications   Medication Sig Start Date End Date Taking? Authorizing Provider  loperamide (IMODIUM) 2 MG capsule Take 1 capsule (2 mg total) by mouth every 6 (six) hours as needed for diarrhea or loose stools. 09/22/17  Yes Thurnell Lose, MD  metroNIDAZOLE (FLAGYL) 500 MG tablet Take 1 tablet (500 mg total) by mouth 3 (three) times daily. 09/28/17  Yes Fulp, Cammie, MD  pantoprazole (PROTONIX) 40 MG tablet Take 1 tablet (40 mg total) by mouth 2 (two) times daily. 09/22/17  Yes Thurnell Lose, MD  allopurinol (ZYLOPRIM) 300 MG tablet Take 1 tablet (300 mg total) by mouth 2 (two) times daily. Patient not taking: Reported on 09/22/2017 06/01/17   [name], MD  atorvastatin (LIPITOR) 20 MG tablet Take 1 tablet (20 mg total) by mouth daily. Patient not taking: Reported on 09/22/2017 06/01/17   [name], MD  cyclobenzaprine (FLEXERIL) 10 MG tablet TAKE 1 TABLET BY MOUTH 2 TIMES DAILY AS NEEDED FOR MUSCLE SPASMS. 10/02/17   [name], MD  sildenafil (VIAGRA) 50 MG tablet Take 1 tablet (50 mg total) by mouth daily as needed for erectile dysfunction. At least 24 hours between doses Patient not taking: Reported on 09/22/2017 07/21/17   [name], MD  Family History Family History  Problem Relation Age of Onset  . Heart disease Neg Hx   . Kidney disease Neg Hx     Social History Social History   Tobacco Use  . Smoking status: Current Every Day Smoker    Packs/day: 0.25    Types: Cigarettes  . Smokeless tobacco: Never Used  . Tobacco comment: 8 cigs daily  Substance Use Topics  . Alcohol use: No  . Drug use: No     Allergies   Vicodin [hydrocodone-acetaminophen]   Review of Systems Review of Systems All other systems negative except as documented in the  HPI. All pertinent positives and negatives as reviewed in the HPI.  Physical Exam Updated Vital Signs BP (!) 196/117   Pulse 79   Temp 97.7 F (36.5 C) (Oral)   Resp 15   SpO2 99%   Physical Exam  Constitutional: He is oriented to person, place, and time. He appears well-developed and well-nourished. No distress.  HENT:  Head: Normocephalic and atraumatic.  Mouth/Throat: Oropharynx is clear and moist.  Eyes: Pupils are equal, round, and reactive to light.  Neck: Normal range of motion. Neck supple.  Cardiovascular: Normal rate, regular rhythm and normal heart sounds. Exam reveals no gallop and no friction rub.  No murmur heard. Pulmonary/Chest: Effort normal and breath sounds normal. No respiratory distress. He has no wheezes.  Abdominal: Soft. Bowel sounds are normal. He exhibits no distension. There is no tenderness.  Musculoskeletal: He exhibits edema.       Right lower leg: He exhibits tenderness, swelling and edema.  Neurological: He is alert and oriented to person, place, and time. He exhibits normal muscle tone. Coordination normal.  Skin: Skin is warm and dry. Capillary refill takes less than 2 seconds. No rash noted. No erythema.  Psychiatric: He has a normal mood and affect. His behavior is normal.  Nursing note and vitals reviewed.    ED Treatments / Results  Labs (all labs ordered are listed, but only abnormal results are displayed) Labs Reviewed - No data to display  EKG None  Radiology Ct Angio Chest Pe W/cm &/or Wo Cm  Result Date: 10/02/2017 CLINICAL DATA:  52 year old male with a history of DVT and leg swelling. EXAM: CT ANGIOGRAPHY CHEST WITH CONTRAST TECHNIQUE: Multidetector CT imaging of the chest was performed using the standard protocol during bolus administration of intravenous contrast. Multiplanar CT image reconstructions and MIPs were obtained to evaluate the vascular anatomy. CONTRAST:  149mL ISOVUE-370 IOPAMIDOL (ISOVUE-370) INJECTION 76%  COMPARISON:  CT 06/21/2017 FINDINGS: Cardiovascular: Heart: No cardiomegaly. No pericardial fluid/thickening. No significant coronary calcifications. There is no measured inversion of the RV/LV ratio, with no septal inversion. No evidence of right-sided heart strain by CT imaging. Aorta: Unremarkable course, caliber, contour of the thoracic aorta. No aneurysm or dissection flap. No periaortic fluid. Pulmonary arteries: Central filling defects of the bilateral lower lobes involving segmental and subsegmental vessels. No filling defects of the lobar or the main pulmonary artery. Mediastinum/Nodes: No mediastinal adenopathy. Unremarkable appearance of the thoracic esophagus. Unremarkable appearance of the thoracic inlet and thyroid. Lungs/Pleura: Central airways are clear. No pleural effusion. No confluent airspace disease. No pneumothorax. Upper Abdomen: Vague inflammatory changes/edema of the left upper quadrant at the hilum of the spleen and involving distal pancreas. Multiple lymph nodes are present at this location. Musculoskeletal: Degenerative changes of the spine. No acute displaced fracture. Review of the MIP images confirms the above findings. IMPRESSION: CT positive for bilateral pulmonary emboli involving  lower lobe segmental and subsegmental branches. Inflammatory changes of the left upper quadrant at the hilum of the spleen and involving distal pancreas. This may reflect acute pancreatitis, however, given the presence of pulmonary emboli, malignancy is also considered. Further evaluation with contrast-enhanced abdominal CT recommended. These results were called by telephone at the time of interpretation on 10/02/2017 at 10:34 am to Dr. Dalia Heading , who verbally acknowledged these results. Electronically Signed   By: Corrie Mckusick D.O.   On: 10/02/2017 10:35    Procedures Procedures (including critical care time)  Medications Ordered in ED Medications  iopamidol (ISOVUE-370) 76 % injection  (has no administration in time range)  traMADol (ULTRAM) tablet 50 mg (50 mg Oral Given 10/02/17 0809)  iopamidol (ISOVUE-370) 76 % injection 100 mL (100 mLs Intravenous Contrast Given 10/02/17 0956)     Initial Impression / Assessment and Plan / ED Course  I have reviewed the triage vital signs and the nursing notes.  Pertinent labs & imaging results that were available during my care of the patient were reviewed by me and considered in my medical decision making (see chart for details).    CRITICAL CARE Performed by: Resa Miner Rune Mendez Total critical care time: 30 minutes Critical care time was exclusive of separately billable procedures and treating other patients. Critical care was necessary to treat or prevent imminent or life-threatening deterioration. Critical care was time spent personally by me on the following activities: development of treatment plan with patient and/or surrogate as well as nursing, discussions with consultants, evaluation of patient's response to treatment, examination of patient, obtaining history from patient or surrogate, ordering and performing treatments and interventions, ordering and review of laboratory studies, ordering and review of radiographic studies, pulse oximetry and re-evaluation of patient's condition.   Patient will be admitted to the hospital for his PEs and DVT.  The patient has extensive DVT in the right leg.  I did speak with Dr. Oneida Alar of vascular surgery who will see the patient once he is admitted to the hospital.  For possible embolization procedure of the blood clot in the right leg.  I have admitted the patient to the [facility] service.  Heparin was started on the patient as well. Final Clinical Impressions(s) / ED Diagnoses   Final diagnoses:  None    ED Discharge Orders    None       Dalia Heading, PA-C 10/02/17 1323    Pattricia Boss, MD 10/05/17 [phone]

## 2017-10-02 NOTE — ED Triage Notes (Signed)
Pt arrives ambulatory to triage with c/o leg swelling. Reports right leg swelling over the past two days and now having some swelling in his left leg. Evaluated tonight at [facility] for the same.

## 2017-10-02 NOTE — Progress Notes (Signed)
Preliminary notes--Right lower extremity venous duplex exam completed. Positive for Acute DVT involving distal femoral vein, popliteal vein, posterior tibial veins and peroneal veins, gastrocnemius veins. Result notified RN.  Hongying Adahlia Stembridge (RDMS RVT) 10/02/17 9:12 AM

## 2017-10-02 NOTE — ED Provider Notes (Signed)
[facility] EMERGENCY DEPARTMENT Provider Note  CSN: [id number] Arrival date & time: 10/01/17 2131  Chief Complaint(s) Leg Swelling  HPI Terry Poole is a 52 y.o. male recently seen for AKI due to NSAID use presents to the emergency department with several months of intermittent lower extremity edema, alternating between the left and right lower extremity.  Patient has been using tight socks but not compression stockings.  Works on his feet, which makes the swelling worse.  No other alleviating or aggravating factors.  Denies any trauma.  Denies any redness or recent infection.  Denies any prior history of DVTs.  Denies any associated chest pain or shortness of breath.  HPI  Past Medical History Past Medical History:  Diagnosis Date  . Bronchitis   . Bronchitis   . Gout   . Hammertoe of second toe of right foot   . Smoker    Patient Active Problem List   Diagnosis Date Noted  . AKI (acute kidney injury) ([place]) 09/22/2017  . Chest pain 09/22/2017  . Hypertension 11/19/2016  . Type 2 diabetes mellitus ([place]) 08/19/2016  . Hyperlipidemia 08/19/2016  . Back pain 05/13/2016  . Hammertoe of second toe of right foot 03/06/2016  . Onychomycosis 04/16/2015  . Pseudofolliculitis barbae 50/27/7412  . Gout 03/28/2015   Home Medication(s) Prior to Admission medications   Medication Sig Start Date End Date Taking? Authorizing Provider  allopurinol (ZYLOPRIM) 300 MG tablet Take 1 tablet (300 mg total) by mouth 2 (two) times daily. Patient not taking: Reported on 09/22/2017 06/01/17   [name], MD  atorvastatin (LIPITOR) 20 MG tablet Take 1 tablet (20 mg total) by mouth daily. Patient not taking: Reported on 09/22/2017 06/01/17   [name], MD  loperamide (IMODIUM) 2 MG capsule Take 1 capsule (2 mg total) by mouth every 6 (six) hours as needed for diarrhea or loose stools. Patient not taking: Reported on 09/24/2017 09/22/17   Thurnell Lose, MD  metroNIDAZOLE  (FLAGYL) 500 MG tablet Take 1 tablet (500 mg total) by mouth 3 (three) times daily. 09/28/17   Fulp, Cammie, MD  pantoprazole (PROTONIX) 40 MG tablet Take 1 tablet (40 mg total) by mouth 2 (two) times daily. Patient not taking: Reported on 09/24/2017 09/22/17   Thurnell Lose, MD  sildenafil (VIAGRA) 50 MG tablet Take 1 tablet (50 mg total) by mouth daily as needed for erectile dysfunction. At least 24 hours between doses Patient not taking: Reported on 09/22/2017 07/21/17   [name], MD                                                                                                                                    Past Surgical History Past Surgical History:  Procedure Laterality Date  . HAMMER TOE SURGERY Right 03/10/2016   Procedure: 2ND RIGHT HAMMER TOE CORRECTION;  Surgeon: Landis Martins, DPM;  Location: [facility];  Service: Podiatry;  Laterality: Right;  . MASS EXCISION Right 03/10/2016   Procedure: EXCISION OF CALLUS 2ND RIGHT TOE;  Surgeon: Landis Martins, DPM;  Location: [facility];  Service: Podiatry;  Laterality: Right;  . NO PAST SURGERIES     Family History Family History  Problem Relation Age of Onset  . Heart disease Neg Hx   . Kidney disease Neg Hx     Social History Social History   Tobacco Use  . Smoking status: Current Every Day Smoker    Packs/day: 0.25    Types: Cigarettes  . Smokeless tobacco: Never Used  . Tobacco comment: 8 cigs daily  Substance Use Topics  . Alcohol use: No  . Drug use: No   Allergies Vicodin [hydrocodone-acetaminophen]  Review of Systems Review of Systems All other systems are reviewed and are negative for acute change except as noted in the HPI  Physical Exam Vital Signs  I have reviewed the triage vital signs BP (!) 154/97 (BP Location: Right Arm)   Pulse 84   Temp 97.8 F (36.6 C) (Oral)   Resp 20   Ht 5\' 9"  (1.753 m)   Wt 98.4 kg   SpO2 100%   BMI 32.05 kg/m   Physical Exam    Constitutional: He is oriented to person, place, and time. He appears well-developed and well-nourished. No distress.  HENT:  Head: Normocephalic and atraumatic.  Nose: Nose normal.  Eyes: Pupils are equal, round, and reactive to light. Conjunctivae and EOM are normal. Right eye exhibits no discharge. Left eye exhibits no discharge. No scleral icterus.  Neck: Normal range of motion. Neck supple.  Cardiovascular: Normal rate and regular rhythm. Exam reveals no gallop and no friction rub.  No murmur heard. Pulmonary/Chest: Effort normal and breath sounds normal. No stridor. No respiratory distress. He has no rales.  Abdominal: Soft. He exhibits no distension. There is no tenderness.  Musculoskeletal: He exhibits no edema or tenderness.       Right knee: He exhibits effusion.  BLE edema, R>L  Neurological: He is alert and oriented to person, place, and time.  Skin: Skin is warm and dry. No rash noted. He is not diaphoretic. No erythema.  Psychiatric: He has a normal mood and affect.  Vitals reviewed.   ED Results and Treatments Labs (all labs ordered are listed, but only abnormal results are displayed) Labs Reviewed  CBC - Abnormal; Notable for the following components:      Result Value   WBC 12.0 (*)    RBC 4.11 (*)    Hemoglobin 11.5 (*)    HCT 36.1 (*)    All other components within normal limits  COMPREHENSIVE METABOLIC PANEL - Abnormal; Notable for the following components:   Potassium 3.4 (*)    Glucose, Bld 162 (*)    BUN 5 (*)    Calcium 8.4 (*)    Albumin 3.0 (*)    Alkaline Phosphatase 129 (*)    All other components within normal limits  BRAIN NATRIURETIC PEPTIDE  EKG  EKG Interpretation  Date/Time:    Ventricular Rate:    PR Interval:    QRS Duration:   QT Interval:    QTC Calculation:   R Axis:     Text Interpretation:         Radiology No results found. Pertinent labs & imaging results that were available during my care of the patient were reviewed by me and considered in my medical decision making (see chart for details).  Medications Ordered in ED Medications - No data to display                                                                                                                                  Procedures Procedures  (including critical care time)  Medical Decision Making / ED Course I have reviewed the nursing notes for this encounter and the patient's prior records (if available in EHR or on provided paperwork).    Peripheral edema, right greater than left.  Ongoing for several months and fluctuating.  Recent AKI due to NSAID use.  Doubt DVT.  No evidence of cellulitis or superimposed infection.  Labs with improved renal function.  BNP within normal limits, doubt heart failure.  Likely dependent.  Recommended compression stockings and elevation with low-salt diet.  Patient has follow-up with his PCP within 1 week.  The patient is safe for discharge with strict return precautions.   Final Clinical Impression(s) / ED Diagnoses Final diagnoses:  Peripheral edema   Disposition: Discharge  Condition: Good  I have discussed the results, Dx and Tx plan with the patient who expressed understanding and agree(s) with the plan. Discharge instructions discussed at great length. The patient was given strict return precautions who verbalized understanding of the instructions. No further questions at time of discharge.    ED Discharge Orders    None       Follow Up: [name], MD [place] [address] [phone]  Go to  As scheduled      This chart was dictated using voice recognition software.  Despite best efforts to proofread,  errors can occur which can change the documentation meaning.   Fatima Blank, MD 10/02/17 [phone]

## 2017-10-03 LAB — BASIC METABOLIC PANEL
Anion gap: 9 (ref 5–15)
BUN: 7 mg/dL (ref 6–20)
CALCIUM: 7.9 mg/dL — AB (ref 8.9–10.3)
CO2: 24 mmol/L (ref 22–32)
CREATININE: 1.07 mg/dL (ref 0.61–1.24)
Chloride: 104 mmol/L (ref 98–111)
GFR calc non Af Amer: >60 mL/min (ref >60)
Glucose, Bld: 225 mg/dL — ABNORMAL HIGH (ref 70–99)
Potassium: 3.4 mmol/L — ABNORMAL LOW (ref 3.5–5.1)
SODIUM: 137 mmol/L (ref 135–145)

## 2017-10-03 LAB — CBC
HEMATOCRIT: 32.5 % — AB (ref 39.0–52.0)
Hemoglobin: 10.4 g/dL — ABNORMAL LOW (ref 13.0–17.0)
MCH: 27.5 pg (ref 26.0–34.0)
MCHC: 32 g/dL (ref 30.0–36.0)
MCV: 86 fL (ref 78.0–100.0)
Platelets: 183 10*3/uL (ref 150–400)
RBC: 3.78 MIL/uL — ABNORMAL LOW (ref 4.22–5.81)
RDW: 14.6 % (ref 11.5–15.5)
WBC: 11.2 10*3/uL — AB (ref 4.0–10.5)

## 2017-10-03 LAB — GLUCOSE, CAPILLARY
Glucose-Capillary: 121 mg/dL — ABNORMAL HIGH (ref 70–99)
Glucose-Capillary: 181 mg/dL — ABNORMAL HIGH (ref 70–99)

## 2017-10-03 LAB — HEPARIN LEVEL (UNFRACTIONATED)
Heparin Unfractionated: 0.35 IU/mL (ref 0.30–0.70)
Heparin Unfractionated: 0.41 IU/mL (ref 0.30–0.70)

## 2017-10-03 LAB — HOMOCYSTEINE: HOMOCYSTEINE-NORM: 13.7 umol/L (ref 0.0–15.0)

## 2017-10-03 MED ORDER — TRAMADOL HCL 50 MG PO TABS: 50.0000 mg | ORAL_TABLET | Freq: Four times a day (QID) | ORAL | Status: DC | PRN

## 2017-10-03 MED ORDER — POTASSIUM CHLORIDE CRYS ER 20 MEQ PO TBCR
40.0000 meq | EXTENDED_RELEASE_TABLET | Freq: Once | ORAL | Status: AC
  Administered 2017-10-03: 40 meq via ORAL
  Filled 2017-10-03: qty 2

## 2017-10-03 MED ORDER — OXYCODONE HCL 5 MG PO TABS
5.0000 mg | ORAL_TABLET | ORAL | Status: DC | PRN
  Administered 2017-10-03: 5 mg via ORAL
  Administered 2017-10-03: 10 mg via ORAL
  Administered 2017-10-03: 5 mg via ORAL
  Administered 2017-10-04 – 2017-10-05 (×4): 10 mg via ORAL
  Filled 2017-10-03 (×4): qty 2
  Filled 2017-10-03: qty 1
  Filled 2017-10-03 (×2): qty 2

## 2017-10-03 MED ORDER — INSULIN ASPART 100 UNIT/ML ~~LOC~~ SOLN
0.0000 [IU] | Freq: Three times a day (TID) | SUBCUTANEOUS | Status: DC
  Administered 2017-10-04 – 2017-10-05 (×2): 3 [IU] via SUBCUTANEOUS
  Administered 2017-10-06: 1 [IU] via SUBCUTANEOUS

## 2017-10-03 MED ORDER — SODIUM CHLORIDE 0.9 % IV SOLN
INTRAVENOUS | Status: DC
  Administered 2017-10-03: 17:00:00 via INTRAVENOUS

## 2017-10-03 MED ORDER — INSULIN ASPART 100 UNIT/ML ~~LOC~~ SOLN: 0.0000 [IU] | Freq: Every day | SUBCUTANEOUS | Status: DC

## 2017-10-03 NOTE — Progress Notes (Signed)
[facility] TEAM 1 - Stepdown/ICU TEAM  Terry Poole  GBT:[id number] DOB: 03/08/1965 DOA: 10/02/2017 PCP: [name], MD    Brief Narrative:  52 year old male w/ a hx of HTN and HLD who was discharged 8/20 after treatment for AKI due to recent NSAID use.  He returned w/ a several months history of swelling in his legs, progressively worse in the preceding 3 days.    In the ED venous duplex revealed extensive acute DVT involving the distal femoral, popliteal, posterior tibial, peroneal, and gastrocnemous veins. CT angio chest was also positive for B PE.  Subjective: Resting comfortably on side of bed.  RLE remains quite swollen and painful.  Denies cp or sob.  Some LUQ abdom pain which is actually quite pleuritc sounding in nature.    Assessment & Plan:  Acute B PE w/ extensive R LE DVT Cont IV heparin for now - plan is for 6 months minimum of anticoag - will not transition to oral med until clear if there is a pancreatic or splenic mass which may require bx - will ask CM to investigate options for DOAC v/s warfarin use   Inflammatory changes of distal pancreas and spleen Incidentally noted on CTa chest - for CT abdom to better evaluated   Epigastric pain Lipase is normal - ?if this is just referred pain from his PE/pulmonary infarct?  Hypokalemia   Supplement and follow - check Mg in AM   DM2 A1c drawn at his PCP's office 8/22 was 6.5 - pt is not on DM meds and appears to be diet controlled only - follow CBG   Gout  Uric acid drawn at his PCP's office 8/22 was signif elevated at 9.3 - no acute flair at this time   Tobacco abuse  Cont to counsel on absolute need to permanently abstain from smoking   DVT prophylaxis: IV heparin  Code Status: FULL CODE Family Communication: no family present at time of exam  Disposition Plan: stable for tele bed   Consultants:  VVS  Antimicrobials:  none   Objective: Blood pressure (!) 145/91, pulse 77, temperature 98.1 F (36.7 C),  temperature source Oral, resp. rate (!) 21, SpO2 98 %.  Intake/Output Summary (Last 24 hours) at 10/03/2017 1422 Last data filed at 10/03/2017 1255 Gross per 24 hour  Intake 2882.6 ml  Output -  Net 2882.6 ml   There were no vitals filed for this visit.  Examination: General: No acute respiratory distress Lungs: Clear to auscultation bilaterally without wheezes or crackles Cardiovascular: Regular rate and rhythm without murmur gallop or rub normal S1 and S2 Abdomen: Nontender, nondistended, soft, bowel sounds positive, no rebound, no ascites, no appreciable mass Extremities: 2+ edema R LE   CBC: Recent Labs  Lab 10/01/17 2152 10/03/17 0030  WBC 12.0* 11.2*  HGB 11.5* 10.4*  HCT 36.1* 32.5*  MCV 87.8 86.0  PLT 184 710   Basic Metabolic Panel: Recent Labs  Lab 10/01/17 2152 10/03/17 0030  NA 140 137  K 3.4* 3.4*  CL 106 104  CO2 25 24  GLUCOSE 162* 225*  BUN 5* 7  CREATININE 1.15 1.07  CALCIUM 8.4* 7.9*   GFR: Estimated Creatinine Clearance: 93.4 mL/min (by C-G formula based on SCr of 1.07 mg/dL).  Liver Function Tests: Recent Labs  Lab 10/01/17 2152  AST 19  ALT 25  ALKPHOS 129*  BILITOT 0.8  PROT 8.0  ALBUMIN 3.0*   Recent Labs  Lab 10/02/17 1227  LIPASE 25   Coagulation  Profile: Recent Labs  Lab 10/02/17 1100  INR 1.22    HbA1C: Hemoglobin A1C  Date/Time Value Ref Range Status  09/24/2017 11:57 AM 6.5 (A) 4.0 - 5.6 % Final  05/04/2017 04:33 PM 6.4  Final   HbA1c, POC (prediabetic range)  Date/Time Value Ref Range Status  09/24/2017 11:57 AM 6.5 (A) 5.7 - 6.4 % Final   HbA1c, POC (controlled diabetic range)  Date/Time Value Ref Range Status  09/24/2017 11:57 AM 6.5 0.0 - 7.0 % Final   HbA1c POC (<> result, manual entry)  Date/Time Value Ref Range Status  09/24/2017 11:57 AM 6.5 4.0 - 5.6 % Final    Scheduled Meds: . nicotine  21 mg Transdermal Daily  . pantoprazole  40 mg Oral BID   Continuous Infusions: . sodium chloride 75  mL/hr at 10/03/17 0447  . heparin 1,700 Units/hr (10/03/17 0442)     LOS: 1 day   Cherene Altes, MD [facility] Office  [phone] Pager - Text Page per Shea [facility]  If 7PM-7AM, please contact night-coverage per Amion 10/03/2017, 2:22 PM

## 2017-10-03 NOTE — Plan of Care (Signed)
Care plans reviewed and patient is progressing.  

## 2017-10-03 NOTE — Progress Notes (Signed)
ANTICOAGULATION CONSULT NOTE  Pharmacy Consult for Heparin Indication: Bilateral Pulmonary Embolism + RLE DVT  Allergies  Allergen Reactions  . Vicodin [Hydrocodone-Acetaminophen] Hives    Patient stated he is no longer allergic to vicodin, patient stated that he was allergic about 12 years ago.     Patient Measurements:   Heparin Dosing Weight: 91 kg  Vital Signs: Temp: 98.1 F (36.7 C) (08/31 0819) Temp Source: Oral (08/31 0819) BP: 145/91 (08/31 0819) Pulse Rate: 77 (08/31 0819)  Labs: Recent Labs    10/01/17 2152 10/02/17 1100 10/02/17 1652 10/03/17 0030 10/03/17 0947  HGB 11.5*  --   --  10.4*  --   HCT 36.1*  --   --  32.5*  --   PLT 184  --   --  183  --   APTT  --  28  --   --   --   LABPROT  --  15.3*  --   --   --   INR  --  1.22  --   --   --   HEPARINUNFRC  --   --  0.28* 0.35 0.41  CREATININE 1.15  --   --  1.07  --     Estimated Creatinine Clearance: 93.4 mL/min (by C-G formula based on SCr of 1.07 mg/dL).   Medical History: Past Medical History:  Diagnosis Date  . Bronchitis   . Bronchitis   . Gout   . Hammertoe of second toe of right foot   . Smoker     Medications:  No anticoagulation PTA  Assessment: 55 yoM with c/o leg swelling found to have acute RLE DVT and bilateral PEs on imaging. Pharmacy consulted to dose IV heparin. Heparin level now therapeutic x2 on 1700 units/hr. Hgb down slightly, no reported bleeding noted.  Goal of Therapy:  Heparin level 0.3-0.7 units/ml Monitor platelets by anticoagulation protocol: Yes   Plan:  -Heparin 1700 units/hr -Daily heparin level, CBC -F/U longterm OAC plan  Arrie Senate, PharmD, BCPS Clinical Pharmacist [phone] Please check AMION for all [facility] Pharmacy numbers 10/03/2017

## 2017-10-03 NOTE — Progress Notes (Signed)
ANTICOAGULATION CONSULT NOTE  Pharmacy Consult for Heparin Indication: Bilateral Pulmonary Embolism + RLE DVT  Allergies  Allergen Reactions  . Vicodin [Hydrocodone-Acetaminophen] Hives    Patient stated he is no longer allergic to vicodin, patient stated that he was allergic about 12 years ago.     Patient Measurements:   Heparin Dosing Weight: 91 kg  Vital Signs: Temp: 98.5 F (36.9 C) (08/30 2352) Temp Source: Oral (08/30 2114) BP: 138/87 (08/30 2352) Pulse Rate: 88 (08/30 2352)  Labs: Recent Labs    10/01/17 2152 10/02/17 1100 10/02/17 1652 10/03/17 0030  HGB 11.5*  --   --  10.4*  HCT 36.1*  --   --  32.5*  PLT 184  --   --  183  APTT  --  28  --   --   LABPROT  --  15.3*  --   --   INR  --  1.22  --   --   HEPARINUNFRC  --   --  0.28* 0.35  CREATININE 1.15  --   --   --     Estimated Creatinine Clearance: 86.9 mL/min (by C-G formula based on SCr of 1.15 mg/dL).   Medical History: Past Medical History:  Diagnosis Date  . Bronchitis   . Bronchitis   . Gout   . Hammertoe of second toe of right foot   . Smoker     Medications:  No anticoagulation PTA  Assessment:  53yr male with c/o leg swelling  Dopplers = + acute RLE DVT involving distal femoral, popliteal, posterior tibial, peroneal and gastrocnemius veins  CTAngio = + bilateral PE  Pharmacy consulted to dose IV heparin  8/31 AM update: heparin level is therapeutic x 1 after rate increase  Goal of Therapy:  Heparin level 0.3-0.7 units/ml Monitor platelets by anticoagulation protocol: Yes   Plan:  Cont heparin at 1700 units/hr Confirmatory heparin level at [place], PharmD, [facility] Pharmacist Phone: [phone]

## 2017-10-04 ENCOUNTER — Inpatient Hospital Stay (HOSPITAL_COMMUNITY): Payer: Self-pay

## 2017-10-04 DIAGNOSIS — I2699 Other pulmonary embolism without acute cor pulmonale: Principal | ICD-10-CM

## 2017-10-04 DIAGNOSIS — R0789 Other chest pain: Secondary | ICD-10-CM

## 2017-10-04 DIAGNOSIS — I82411 Acute embolism and thrombosis of right femoral vein: Secondary | ICD-10-CM

## 2017-10-04 LAB — CBC
HCT: 32.8 % — ABNORMAL LOW (ref 39.0–52.0)
Hemoglobin: 10.6 g/dL — ABNORMAL LOW (ref 13.0–17.0)
MCH: 27.7 pg (ref 26.0–34.0)
MCHC: 32.3 g/dL (ref 30.0–36.0)
MCV: 85.9 fL (ref 78.0–100.0)
PLATELETS: 201 10*3/uL (ref 150–400)
RBC: 3.82 MIL/uL — AB (ref 4.22–5.81)
RDW: 14.3 % (ref 11.5–15.5)
WBC: 8.6 10*3/uL (ref 4.0–10.5)

## 2017-10-04 LAB — COMPREHENSIVE METABOLIC PANEL
ALT: 18 U/L (ref 0–44)
ANION GAP: 7 (ref 5–15)
AST: 14 U/L — ABNORMAL LOW (ref 15–41)
Albumin: 2.6 g/dL — ABNORMAL LOW (ref 3.5–5.0)
Alkaline Phosphatase: 103 U/L (ref 38–126)
BILIRUBIN TOTAL: 0.8 mg/dL (ref 0.3–1.2)
BUN: 6 mg/dL (ref 6–20)
CHLORIDE: 104 mmol/L (ref 98–111)
CO2: 26 mmol/L (ref 22–32)
Calcium: 8.1 mg/dL — ABNORMAL LOW (ref 8.9–10.3)
Creatinine, Ser: 0.94 mg/dL (ref 0.61–1.24)
GLUCOSE: 147 mg/dL — AB (ref 70–99)
POTASSIUM: 3.4 mmol/L — AB (ref 3.5–5.1)
Sodium: 137 mmol/L (ref 135–145)
TOTAL PROTEIN: 7 g/dL (ref 6.5–8.1)

## 2017-10-04 LAB — GLUCOSE, CAPILLARY
GLUCOSE-CAPILLARY: 107 mg/dL — AB (ref 70–99)
Glucose-Capillary: 118 mg/dL — ABNORMAL HIGH (ref 70–99)
Glucose-Capillary: 221 mg/dL — ABNORMAL HIGH (ref 70–99)
Glucose-Capillary: 80 mg/dL (ref 70–99)

## 2017-10-04 LAB — ECHOCARDIOGRAM COMPLETE

## 2017-10-04 LAB — MAGNESIUM: Magnesium: 1.7 mg/dL (ref 1.7–2.4)

## 2017-10-04 LAB — HEPARIN LEVEL (UNFRACTIONATED): HEPARIN UNFRACTIONATED: 0.39 [IU]/mL (ref 0.30–0.70)

## 2017-10-04 MED ORDER — IOPAMIDOL (ISOVUE-300) INJECTION 61%
INTRAVENOUS | Status: AC
  Filled 2017-10-04: qty 100

## 2017-10-04 MED ORDER — IOPAMIDOL (ISOVUE-300) INJECTION 61%
100.0000 mL | Freq: Once | INTRAVENOUS | Status: AC | PRN
  Administered 2017-10-04: 100 mL via INTRAVENOUS

## 2017-10-04 MED ORDER — POTASSIUM CHLORIDE CRYS ER 20 MEQ PO TBCR
40.0000 meq | EXTENDED_RELEASE_TABLET | Freq: Once | ORAL | Status: AC
  Administered 2017-10-04: 40 meq via ORAL
  Filled 2017-10-04: qty 2

## 2017-10-04 NOTE — Plan of Care (Signed)
Pt is progressing 

## 2017-10-04 NOTE — Progress Notes (Addendum)
PROGRESS NOTE    Terry Poole  JQB:[id number] DOB: May 19, 1965 DOA: 10/02/2017 PCP: [name], MD    Brief Narrative:  52 year old male w/ a hx of HTN and HLD who was discharged 8/20 after treatment for AKI due to recent NSAID use. He returned w/ a several months history of swelling in his legs, progressively worse in the preceding 3 days.   In the ED venous duplex revealed extensive acute DVT involving the distal femoral, popliteal, posterior tibial, peroneal, and gastrocnemous veins. CT angio chest was also positive for B PE.  Assessment & Plan:   Active Problems:   Pulmonary embolism (HCC)   Bilateral PE, and acute DVT in the right RLL On heparin gtt, plan to transition to oral NOAC, once CT abd and pelvis is done and malignancy is ruled out.  Pain control for the RLL pain.    Inflammatory changes in the spleen and pancreas: Differential include malignancy vs inflammation.  Further work up with CT abd and pelvis with contrast.    Hypokalemia  REPLACED.    Type 2 DM CBG (last 3)  Recent Labs    10/03/17 1713 10/03/17 2136 10/04/17 0626  GLUCAP 121* 181* 118*   Well controlled.  Diet controlled.    Gout:  No flare up.    Tobacco abuse  Counseled.    Mild leukocytosis: Probably reactive.  Resolved.     Normocytic anemia: Hemoglobin stable around 10 on anti coagulation.    DVT prophylaxis: heparin gtt.  Code Status: full code.  Family Communication: none at bedside.  Disposition Plan: pending further work up by CT abd and  Pelvis for malignancy work up.    Consultants:   Vascular surgery consult.    Procedures:CT abd and pelvis pending   Antimicrobials: none.   Subjective: Epigastric pain improved. No chest pain or sob.   Objective: Vitals:   10/03/17 0819 10/03/17 2125 10/04/17 0629 10/04/17 0700  BP: (!) 145/91 (!) 141/87 132/89 (!) 144/98  Pulse: 77 81 76 76  Resp: (!) 21 (!) 25 17 17   Temp: 98.1 F (36.7 C) 98.3 F  (36.8 C) 98.7 F (37.1 C) 98.7 F (37.1 C)  TempSrc: Oral Oral Oral Oral  SpO2: 98% 100% 99% 100%    Intake/Output Summary (Last 24 hours) at 10/04/2017 0935 Last data filed at 10/04/2017 0515 Gross per 24 hour  Intake 1454.73 ml  Output -  Net 1454.73 ml   There were no vitals filed for this visit.  Examination:  General exam: Appears calm and comfortable  Respiratory system: Clear to auscultation. Respiratory effort normal. Cardiovascular system: S1 & S2 heard, RRR. Gastrointestinal system: Abdomen is soft tender in the epigastric area.  Central nervous system: Alert and oriented. No focal neurological deficits. Extremities: right lower extremity tenderness and edema.  Skin: No rashes, lesions or ulcers Psychiatry:  Mood & affect appropriate.     Data Reviewed: I have personally reviewed following labs and imaging studies  CBC: Recent Labs  Lab 10/01/17 2152 10/03/17 0030 10/04/17 0254  WBC 12.0* 11.2* 8.6  HGB 11.5* 10.4* 10.6*  HCT 36.1* 32.5* 32.8*  MCV 87.8 86.0 85.9  PLT 184 183 409   Basic Metabolic Panel: Recent Labs  Lab 10/01/17 2152 10/03/17 0030 10/04/17 0254  NA 140 137 137  K 3.4* 3.4* 3.4*  CL 106 104 104  CO2 25 24 26   GLUCOSE 162* 225* 147*  BUN 5* 7 6  CREATININE 1.15 1.07 0.94  CALCIUM 8.4* 7.9* 8.1*  MG  --   --  1.7   GFR: Estimated Creatinine Clearance: 106.4 mL/min (by C-G formula based on SCr of 0.94 mg/dL). Liver Function Tests: Recent Labs  Lab 10/01/17 2152 10/04/17 0254  AST 19 14*  ALT 25 18  ALKPHOS 129* 103  BILITOT 0.8 0.8  PROT 8.0 7.0  ALBUMIN 3.0* 2.6*   Recent Labs  Lab 10/02/17 1227  LIPASE 25   No results for input(s): AMMONIA in the last 168 hours. Coagulation Profile: Recent Labs  Lab 10/02/17 1100  INR 1.22   Cardiac Enzymes: No results for input(s): CKTOTAL, CKMB, CKMBINDEX, TROPONINI in the last 168 hours. BNP (last 3 results) No results for input(s): PROBNP in the last 8760  hours. HbA1C: No results for input(s): HGBA1C in the last 72 hours. CBG: Recent Labs  Lab 10/03/17 1713 10/03/17 2136 10/04/17 0626  GLUCAP 121* 181* 118*   Lipid Profile: No results for input(s): CHOL, HDL, LDLCALC, TRIG, CHOLHDL, LDLDIRECT in the last 72 hours. Thyroid Function Tests: No results for input(s): TSH, T4TOTAL, FREET4, T3FREE, THYROIDAB in the last 72 hours. Anemia Panel: No results for input(s): VITAMINB12, FOLATE, FERRITIN, TIBC, IRON, RETICCTPCT in the last 72 hours. Sepsis Labs: No results for input(s): PROCALCITON, LATICACIDVEN in the last 168 hours.  No results found for this or any previous visit (from the past 240 hour(s)).       Radiology Studies: Ct Angio Chest Pe W/cm &/or Wo Cm  Result Date: 10/02/2017 CLINICAL DATA:  52 year old male with a history of DVT and leg swelling. EXAM: CT ANGIOGRAPHY CHEST WITH CONTRAST TECHNIQUE: Multidetector CT imaging of the chest was performed using the standard protocol during bolus administration of intravenous contrast. Multiplanar CT image reconstructions and MIPs were obtained to evaluate the vascular anatomy. CONTRAST:  150mL ISOVUE-370 IOPAMIDOL (ISOVUE-370) INJECTION 76% COMPARISON:  CT 06/21/2017 FINDINGS: Cardiovascular: Heart: No cardiomegaly. No pericardial fluid/thickening. No significant coronary calcifications. There is no measured inversion of the RV/LV ratio, with no septal inversion. No evidence of right-sided heart strain by CT imaging. Aorta: Unremarkable course, caliber, contour of the thoracic aorta. No aneurysm or dissection flap. No periaortic fluid. Pulmonary arteries: Central filling defects of the bilateral lower lobes involving segmental and subsegmental vessels. No filling defects of the lobar or the main pulmonary artery. Mediastinum/Nodes: No mediastinal adenopathy. Unremarkable appearance of the thoracic esophagus. Unremarkable appearance of the thoracic inlet and thyroid. Lungs/Pleura: Central  airways are clear. No pleural effusion. No confluent airspace disease. No pneumothorax. Upper Abdomen: Vague inflammatory changes/edema of the left upper quadrant at the hilum of the spleen and involving distal pancreas. Multiple lymph nodes are present at this location. Musculoskeletal: Degenerative changes of the spine. No acute displaced fracture. Review of the MIP images confirms the above findings. IMPRESSION: CT positive for bilateral pulmonary emboli involving lower lobe segmental and subsegmental branches. Inflammatory changes of the left upper quadrant at the hilum of the spleen and involving distal pancreas. This may reflect acute pancreatitis, however, given the presence of pulmonary emboli, malignancy is also considered. Further evaluation with contrast-enhanced abdominal CT recommended. These results were called by telephone at the time of interpretation on 10/02/2017 at 10:34 am to Dr. Dalia Heading , who verbally acknowledged these results. Electronically Signed   By: Corrie Mckusick D.O.   On: 10/02/2017 10:35        Scheduled Meds: . insulin aspart  0-5 Units Subcutaneous QHS  . insulin aspart  0-9 Units Subcutaneous TID WC  . nicotine  21  mg Transdermal Daily  . pantoprazole  40 mg Oral BID  . potassium chloride  40 mEq Oral Once   Continuous Infusions: . sodium chloride 10 mL/hr at 10/04/17 0515  . heparin 1,700 Units/hr (10/04/17 0839)     LOS: 2 days    Time spent: 35 minutes.     Hosie Poisson, MD [facility] Pager [phone]  If 7PM-7AM, please contact night-coverage [email or web address] Password [email or web address] 10/04/2017, 9:35 AM

## 2017-10-04 NOTE — Progress Notes (Signed)
  Echocardiogram 2D Echocardiogram has been performed.  Terry Poole 10/04/2017, 2:52 PM

## 2017-10-04 NOTE — Plan of Care (Signed)
Care plans reviewed and patient is progressing.  

## 2017-10-04 NOTE — Progress Notes (Signed)
ANTICOAGULATION CONSULT NOTE - Follow up [place] for Heparin Indication: Bilateral Pulmonary Embolism + RLE DVT  Allergies  Allergen Reactions  . Vicodin [Hydrocodone-Acetaminophen] Hives    Patient stated he is no longer allergic to vicodin, patient stated that he was allergic about 12 years ago.     Patient Measurements:   Heparin Dosing Weight: 91 kg  Vital Signs: Temp: 98.7 F (37.1 C) (09/01 0700) Temp Source: Oral (09/01 0700) BP: 144/98 (09/01 0700) Pulse Rate: 76 (09/01 0700)  Labs: Recent Labs    10/01/17 2152 10/02/17 1100  10/03/17 0030 10/03/17 0947 10/04/17 0254  HGB 11.5*  --   --  10.4*  --  10.6*  HCT 36.1*  --   --  32.5*  --  32.8*  PLT 184  --   --  183  --  201  APTT  --  28  --   --   --   --   LABPROT  --  15.3*  --   --   --   --   INR  --  1.22  --   --   --   --   HEPARINUNFRC  --   --    < > 0.35 0.41 0.39  CREATININE 1.15  --   --  1.07  --  0.94   < > = values in this interval not displayed.    Estimated Creatinine Clearance: 106.4 mL/min (by C-G formula based on SCr of 0.94 mg/dL).   Medical History: Past Medical History:  Diagnosis Date  . Bronchitis   . Bronchitis   . Gout   . Hammertoe of second toe of right foot   . Smoker     Medications:  No anticoagulation PTA  Assessment: 59 yoM with c/o leg swelling found to have acute RLE DVT and bilateral PEs on imaging. Pharmacy consulted to dose IV heparin. Heparin level remains therapeutic on 1700 units/hr. CBC stable.  Goal of Therapy:  Heparin level 0.3-0.7 units/ml Monitor platelets by anticoagulation protocol: Yes   Plan:  -Continue heparin IV at 1700 units/hr -Daily heparin level, CBC -F/U longterm [facility] plan  Vertis Kelch, PharmD PGY1 Pharmacy Resident Phone [phone] 10/04/2017       7:46 AM

## 2017-10-04 NOTE — Progress Notes (Signed)
  Echocardiogram 2D Echocardiogram has been performed.  Terry Poole 10/04/2017, 2:51 PM

## 2017-10-05 LAB — CBC
HEMATOCRIT: 34.1 % — AB (ref 39.0–52.0)
HEMOGLOBIN: 10.9 g/dL — AB (ref 13.0–17.0)
MCH: 27.2 pg (ref 26.0–34.0)
MCHC: 32 g/dL (ref 30.0–36.0)
MCV: 85 fL (ref 78.0–100.0)
Platelets: 237 10*3/uL (ref 150–400)
RBC: 4.01 MIL/uL — ABNORMAL LOW (ref 4.22–5.81)
RDW: 14.3 % (ref 11.5–15.5)
WBC: 8.2 10*3/uL (ref 4.0–10.5)

## 2017-10-05 LAB — GLUCOSE, CAPILLARY
GLUCOSE-CAPILLARY: 82 mg/dL (ref 70–99)
GLUCOSE-CAPILLARY: 110 mg/dL — AB (ref 70–99)
Glucose-Capillary: 220 mg/dL — ABNORMAL HIGH (ref 70–99)
Glucose-Capillary: 225 mg/dL — ABNORMAL HIGH (ref 70–99)

## 2017-10-05 LAB — HEPARIN LEVEL (UNFRACTIONATED): HEPARIN UNFRACTIONATED: 0.33 [IU]/mL (ref 0.30–0.70)

## 2017-10-05 NOTE — Progress Notes (Signed)
PROGRESS NOTE    Terry Poole  ZOX:[id number] DOB: 06/29/1965 DOA: 10/02/2017 PCP: [name], MD    Brief Narrative:  51 year old male w/ a hx of HTN and HLD who was discharged 8/20 after treatment for AKI due to recent NSAID use. He returned w/ a several months history of swelling in his legs, progressively worse in the preceding 3 days.   In the ED venous duplex revealed extensive acute DVT involving the distal femoral, popliteal, posterior tibial, peroneal, and gastrocnemous veins. CT angio chest was also positive for B PE.  Assessment & Plan:   Active Problems:   Pulmonary embolism (HCC)   Bilateral PE, and acute DVT in the right RLL On heparin gtt, plan to transition to oral NOAC, once CT abd and pelvis is done and malignancy is ruled out. CM to obtain co pay for the NOAC.  Pain control for the RLL pain.  Echocardiogram reviewed and showed mild elevated PA pressures.  Good lVEF. No diastolic dysfunction.   Inflammatory changes in the spleen and pancreas: Differential include malignancy vs inflammation.  Further work up with CT abd and pelvis with contrast.  CT abd and pelvis shows inflammation around the pancreas, recommend repeat ing the CT in 3 to 4 weeks.    Acute pancreatitis; Pt still reports moderate abd epigastric pain.  Recommend clear liquid diet and some bowel rest.    Hypokalemia  REPLACED.    Type 2 DM CBG (last 3)  Recent Labs    10/05/17 0636 10/05/17 1150 10/05/17 1625  GLUCAP 220* 82 225*   Continue with SSI. Marland Kitchen    Gout:  No flare up.    Tobacco abuse  Counseled.    Mild leukocytosis: Probably reactive.  Resolved.     Normocytic anemia: Hemoglobin stable around 10 on anti coagulation.    DVT prophylaxis: heparin gtt.  Code Status: full code.  Family Communication: none at bedside.  Disposition Plan: pending improvement in the epigastric pain.    Consultants:   Vascular surgery consult.    Procedures:CT abd  and pelvis pending   Antimicrobials: none.   Subjective: Pain in the epigastric area.   Objective: Vitals:   10/04/17 0700 10/04/17 2000 10/05/17 0444 10/05/17 1305  BP: (!) 144/98 (!) 139/99 124/76 (!) 134/97  Pulse: 76 74 80 80  Resp: 17  14   Temp: 98.7 F (37.1 C) 98.4 F (36.9 C) 97.9 F (36.6 C) 98.3 F (36.8 C)  TempSrc: Oral Oral Oral Oral  SpO2: 100% 99% 97%     Intake/Output Summary (Last 24 hours) at 10/05/2017 1737 Last data filed at 10/05/2017 1200 Gross per 24 hour  Intake 819.26 ml  Output -  Net 819.26 ml   There were no vitals filed for this visit.  Examination:  General exam: Appears calm and comfortable , walking.  Respiratory system: Clear to auscultation. Respiratory effort normal. Cardiovascular system: S1 & S2 heard, RRR.  Gastrointestinal system: Abdomen is firm  Moderate tenderess in the epigastric area. No signs of peritonitis.  Central nervous system: Alert and oriented. Non focal.  Extremities: right calf tenderness and edema.  Skin: No rashes, lesions or ulcers Psychiatry:  Mood & affect appropriate.     Data Reviewed: I have personally reviewed following labs and imaging studies  CBC: Recent Labs  Lab 10/01/17 2152 10/03/17 0030 10/04/17 0254 10/05/17 0351  WBC 12.0* 11.2* 8.6 8.2  HGB 11.5* 10.4* 10.6* 10.9*  HCT 36.1* 32.5* 32.8* 34.1*  MCV 87.8 86.0 85.9  85.0  PLT 184 183 201 233   Basic Metabolic Panel: Recent Labs  Lab 10/01/17 2152 10/03/17 0030 10/04/17 0254  NA 140 137 137  K 3.4* 3.4* 3.4*  CL 106 104 104  CO2 25 24 26   GLUCOSE 162* 225* 147*  BUN 5* 7 6  CREATININE 1.15 1.07 0.94  CALCIUM 8.4* 7.9* 8.1*  MG  --   --  1.7   GFR: Estimated Creatinine Clearance: 106.4 mL/min (by C-G formula based on SCr of 0.94 mg/dL). Liver Function Tests: Recent Labs  Lab 10/01/17 2152 10/04/17 0254  AST 19 14*  ALT 25 18  ALKPHOS 129* 103  BILITOT 0.8 0.8  PROT 8.0 7.0  ALBUMIN 3.0* 2.6*   Recent Labs  Lab  10/02/17 1227  LIPASE 25   No results for input(s): AMMONIA in the last 168 hours. Coagulation Profile: Recent Labs  Lab 10/02/17 1100  INR 1.22   Cardiac Enzymes: No results for input(s): CKTOTAL, CKMB, CKMBINDEX, TROPONINI in the last 168 hours. BNP (last 3 results) No results for input(s): PROBNP in the last 8760 hours. HbA1C: No results for input(s): HGBA1C in the last 72 hours. CBG: Recent Labs  Lab 10/04/17 1708 10/04/17 2135 10/05/17 0636 10/05/17 1150 10/05/17 1625  GLUCAP 107* 80 220* 82 225*   Lipid Profile: No results for input(s): CHOL, HDL, LDLCALC, TRIG, CHOLHDL, LDLDIRECT in the last 72 hours. Thyroid Function Tests: No results for input(s): TSH, T4TOTAL, FREET4, T3FREE, THYROIDAB in the last 72 hours. Anemia Panel: No results for input(s): VITAMINB12, FOLATE, FERRITIN, TIBC, IRON, RETICCTPCT in the last 72 hours. Sepsis Labs: No results for input(s): PROCALCITON, LATICACIDVEN in the last 168 hours.  No results found for this or any previous visit (from the past 240 hour(s)).       Radiology Studies: Ct Abdomen Pelvis W Contrast  Result Date: 10/05/2017 CLINICAL DATA:  Pulmonary emboli seen on recent chest CT with abnormal appearance in the left upper quadrant of the abdomen. Rule out malignancy. EXAM: CT ABDOMEN AND PELVIS WITH CONTRAST TECHNIQUE: Multidetector CT imaging of the abdomen and pelvis was performed using the standard protocol following bolus administration of intravenous contrast. CONTRAST:  13mL ISOVUE-300 IOPAMIDOL (ISOVUE-300) INJECTION 61% COMPARISON:  Chest CT 10/02/2017 FINDINGS: Lower chest: Normal heart size without pericardial effusion. Mild thickening of the distal esophagus cannot exclude mild esophagitis. No small segmental or subsegmental pulmonary emboli noted on images through the lung bases though the study is not tailored toward assessment of the pulmonary arteries on this exam. Hepatobiliary: Homogeneous attenuation of the  liver. Hyperdensity along the dependent aspect of the nondistended gallbladder may represent an adjacent vessel and less likely gallstones. Pancreas: There is peripancreatic edema more so along the body and tail of the pancreas raising suspicion pancreatitis. A discrete mass is not identified however there is a slightly bulbous appearance to the tail of the pancreas that may simply reflect inflammatory change. No necrosis is identified. No ductal dilatation is seen. Short-term interval follow-up to assure resolution is recommended. Alternatively should findings persist, consider pancreatic protocol MRI or CT with IV contrast. Spleen: No splenomegaly or mass. Adrenals/Urinary Tract: Normal bilateral adrenal glands. Water attenuating cyst in the lower pole the right kidney measuring 16 mm in diameter. Stomach/Bowel: Minimal colonic diverticulosis without acute diverticulitis. The stomach is decompressed in appearance. The duodenal bulb and sweep as well as ligament of Treitz are unremarkable. The appendix is normal as is the distal terminal ileum. No small bowel dilatation or inflammation. Moderate  stool retention within the colon without inflammation. Vascular/Lymphatic: Nonaneurysmal abdominal aorta. A few small peripancreatic mildly enlarged lymph nodes are identified measuring up to 1 cm short axis in the left upper quadrant, series 3/19 and 20 that may be reactive in etiology. Small subcentimeter mesenteric lymph nodes without pathologic enlargement. No pelvic sidewall adenopathy. No inguinal adenopathy. Reproductive: Normal prostate and seminal vesicles. Other: Small amount of free fluid in the pelvis. Fat containing umbilical hernia. Musculoskeletal: No aggressive osseous lesions. Thoracolumbar spondylosis with multilevel degenerative disc disease and endplate spurring greatest at L4-5. Multilevel facet arthrosis of the lumbar spine. IMPRESSION: 1. Peripancreatic edema and mild pancreatic tail enlargement,  nonspecific but more commonly associated with pancreatitis is identified. No discrete mass is apparent. There are 1 cm short axis peripancreatic lymph nodes in the left upper quadrant possibly reactive. Short-term interval follow-up to assure resolution is recommended. Dedicated pancreatic CT or MRI with IV contrast may also help identify occult lesions. 2. Colonic diverticulosis without acute diverticulitis. 3. Thoracolumbar spondylosis. Electronically Signed   By: Ashley Royalty M.D.   On: 10/05/2017 00:21        Scheduled Meds: . insulin aspart  0-5 Units Subcutaneous QHS  . insulin aspart  0-9 Units Subcutaneous TID WC  . nicotine  21 mg Transdermal Daily  . pantoprazole  40 mg Oral BID   Continuous Infusions: . sodium chloride 10 mL/hr at 10/04/17 0515  . heparin 1,700 Units/hr (10/05/17 1444)     LOS: 3 days    Time spent: 35 minutes.     Hosie Poisson, MD [facility] Pager [phone]  If 7PM-7AM, please contact night-coverage [email or web address] Password [facility] 10/05/2017, 5:37 PM

## 2017-10-05 NOTE — Plan of Care (Signed)
Care plans reviewed and patient is progressing.  

## 2017-10-05 NOTE — Progress Notes (Signed)
ANTICOAGULATION CONSULT NOTE - Follow up [place] for Heparin Indication: Bilateral Pulmonary Embolism + RLE DVT  Allergies  Allergen Reactions  . Vicodin [Hydrocodone-Acetaminophen] Hives    Patient stated he is no longer allergic to vicodin, patient stated that he was allergic about 12 years ago.     Patient Measurements:   Heparin Dosing Weight: 91 kg  Vital Signs: Temp: 97.9 F (36.6 C) (09/02 0444) Temp Source: Oral (09/02 0444) BP: 124/76 (09/02 0444) Pulse Rate: 80 (09/02 0444)  Labs: Recent Labs    10/02/17 1100  10/03/17 0030 10/03/17 0947 10/04/17 0254 10/05/17 0351  HGB  --    < > 10.4*  --  10.6* 10.9*  HCT  --   --  32.5*  --  32.8* 34.1*  PLT  --   --  183  --  201 237  APTT 28  --   --   --   --   --   LABPROT 15.3*  --   --   --   --   --   INR 1.22  --   --   --   --   --   HEPARINUNFRC  --    < > 0.35 0.41 0.39 0.33  CREATININE  --   --  1.07  --  0.94  --    < > = values in this interval not displayed.    Estimated Creatinine Clearance: 106.4 mL/min (by C-G formula based on SCr of 0.94 mg/dL).   Medical History: Past Medical History:  Diagnosis Date  . Bronchitis   . Bronchitis   . Gout   . Hammertoe of second toe of right foot   . Smoker     Medications:  No anticoagulation PTA  Assessment: 4 yoM with c/o leg swelling found to have acute RLE DVT and bilateral PEs on imaging. Pharmacy consulted to dose IV heparin. Heparin level remains therapeutic on 1700 units/hr. CBC stable.  Goal of Therapy:  Heparin level 0.3-0.7 units/ml Monitor platelets by anticoagulation protocol: Yes   Plan:  -Continue heparin IV at 1700 units/hr -Daily heparin level, CBC -F/U longterm plan for oral anticoagulation  Marguerite Olea, [facility] Clinical Pharmacist Phone [phone]  10/05/2017 7:37 AM

## 2017-10-06 LAB — BASIC METABOLIC PANEL
Anion gap: 11 (ref 5–15)
BUN: 6 mg/dL (ref 6–20)
CO2: 24 mmol/L (ref 22–32)
CREATININE: 1.09 mg/dL (ref 0.61–1.24)
Calcium: 9 mg/dL (ref 8.9–10.3)
Chloride: 101 mmol/L (ref 98–111)
GFR calc Af Amer: >60 mL/min (ref >60)
GLUCOSE: 170 mg/dL — AB (ref 70–99)
Potassium: 4.3 mmol/L (ref 3.5–5.1)
SODIUM: 136 mmol/L (ref 135–145)

## 2017-10-06 LAB — CARDIOLIPIN ANTIBODIES, IGG, IGM, IGA
Anticardiolipin IgA: <9 APL U/mL (ref 0–11)
Anticardiolipin IgG: <9 GPL U/mL (ref 0–14)
Anticardiolipin IgM: 13 MPL U/mL — ABNORMAL HIGH (ref 0–12)

## 2017-10-06 LAB — CBC
HEMATOCRIT: 33.9 % — AB (ref 39.0–52.0)
HEMOGLOBIN: 11 g/dL — AB (ref 13.0–17.0)
MCH: 27.8 pg (ref 26.0–34.0)
MCHC: 32.4 g/dL (ref 30.0–36.0)
MCV: 85.8 fL (ref 78.0–100.0)
Platelets: 264 10*3/uL (ref 150–400)
RBC: 3.95 MIL/uL — ABNORMAL LOW (ref 4.22–5.81)
RDW: 14.2 % (ref 11.5–15.5)
WBC: 8.3 10*3/uL (ref 4.0–10.5)

## 2017-10-06 LAB — HEPARIN LEVEL (UNFRACTIONATED): Heparin Unfractionated: 0.39 IU/mL (ref 0.30–0.70)

## 2017-10-06 LAB — GLUCOSE, CAPILLARY
GLUCOSE-CAPILLARY: 127 mg/dL — AB (ref 70–99)
GLUCOSE-CAPILLARY: 97 mg/dL (ref 70–99)

## 2017-10-06 LAB — PROTEIN S, TOTAL: Protein S Ag, Total: 82 % (ref 60–150)

## 2017-10-06 MED ORDER — RIVAROXABAN 15 MG PO TABS
15.0000 mg | ORAL_TABLET | Freq: Two times a day (BID) | ORAL | Status: DC
  Administered 2017-10-06: 15 mg via ORAL
  Filled 2017-10-06: qty 1

## 2017-10-06 MED ORDER — RIVAROXABAN 20 MG PO TABS: 20.0000 mg | ORAL_TABLET | Freq: Every day | ORAL | 0 refills | Status: DC

## 2017-10-06 MED ORDER — RIVAROXABAN 15 MG PO TABS: 15.0000 mg | ORAL_TABLET | Freq: Two times a day (BID) | ORAL | 0 refills | Status: DC

## 2017-10-06 MED FILL — XARELTO 15 MG TABLET: 15 | 21 days supply | Qty: 42 | Fill #0

## 2017-10-06 NOTE — Discharge Instructions (Signed)
Information on my medicine - XARELTO (rivaroxaban)  This medication education was reviewed with me or my healthcare representative as part of my discharge preparation.  The pharmacist that spoke with me during my hospital stay was:  Arty Baumgartner, [place]? Xarelto was prescribed to treat blood clots that may have been found in the veins of your legs (deep vein thrombosis) or in your lungs (pulmonary embolism) and to reduce the risk of them occurring again.  What do you need to know about Xarelto? The starting dose is one 15 mg tablet taken TWICE daily with food for the FIRST 21 DAYS then on 10/17/17 the dose is changed to one 20 mg tablet taken ONCE A DAY with your evening meal.  DO NOT stop taking Xarelto without talking to the health care provider who prescribed the medication.  Refill your prescription for 20 mg tablets before you run out.  After discharge, you should have regular check-up appointments with your healthcare provider that is prescribing your Xarelto.  In the future your dose may need to be changed if your kidney function changes by a significant amount.  What do you do if you miss a dose? If you are taking Xarelto TWICE DAILY and you miss a dose, take it as soon as you remember. You may take two 15 mg tablets (total 30 mg) at the same time then resume your regularly scheduled 15 mg twice daily the next day.  If you are taking Xarelto ONCE DAILY and you miss a dose, take it as soon as you remember on the same day then continue your regularly scheduled once daily regimen the next day. Do not take two doses of Xarelto at the same time.   Important Safety Information Xarelto is a blood thinner medicine that can cause bleeding. You should call your healthcare provider right away if you experience any of the following: ? Bleeding from an injury or your nose that does not stop. ? Unusual colored urine (red or dark brown) or unusual  colored stools (red or black). ? Unusual bruising for unknown reasons. ? A serious fall or if you hit your head (even if there is no bleeding).  Some medicines may interact with Xarelto and might increase your risk of bleeding while on Xarelto. To help avoid this, consult your healthcare provider or pharmacist prior to using any new prescription or non-prescription medications, including herbals, vitamins, non-steroidal anti-inflammatory drugs (NSAIDs) and supplements.  This website has more information on Xarelto: [email or web address]

## 2017-10-06 NOTE — Progress Notes (Addendum)
ANTICOAGULATION CONSULT NOTE - Follow Up Consult  Pharmacy Consult for Heparin Indication: bilateral PE and RLE DVT  Allergies  Allergen Reactions  . Vicodin [Hydrocodone-Acetaminophen] Hives    Patient stated he is no longer allergic to vicodin, patient stated that he was allergic about 12 years ago.     Patient Measurements: Height: 5\' 9"  (175.3 cm) Weight: 217 lb (98.4 kg) IBW/kg (Calculated) : 70.7 Heparin Dosing Weight: 91 kg  Vital Signs: Temp: 98.1 F (36.7 C) (09/03 0829) Temp Source: Oral (09/03 0829) BP: 132/93 (09/03 0829) Pulse Rate: 69 (09/03 0829)  Labs: Recent Labs    10/04/17 0254 10/05/17 0351 10/06/17 0321  HGB 10.6* 10.9* 11.0*  HCT 32.8* 34.1* 33.9*  PLT 201 237 264  HEPARINUNFRC 0.39 0.33 0.39  CREATININE 0.94  --   --     Estimated Creatinine Clearance: 106.4 mL/min (by C-G formula based on SCr of 0.94 mg/dL).  Assessment:  39 yoM with c/o leg swelling found to have acute RLE DVT and bilateral PEs on imaging.  Pharmacy consulted to dose IV heparin.    Heparin level remains therapeutic (0.39) on 1700 units/hr.  Goal of Therapy:  Heparin level 0.3-0.7 units/ml Monitor platelets by anticoagulation protocol: Yes   Plan:   Continue heparin drip at 1700 units/hr.  Daily heparin level and CBC.  Follow up plan for oral anticoagulation.  Arty Baumgartner, [place] Pager: [phone] or phone: [phone] 10/06/2017,11:15 AM   Addendum:   Transitioning to [place].   Xarelto 15 mg BID begun and IV heparin stopped at time of first Xarelto dose.   To continue Xarelto 15 mg BID x 21 days then 20 mg daily with supper.  Consuello Masse, RPh 1:16 PM

## 2017-10-06 NOTE — Care Management Note (Signed)
Case Management Note Marvetta Gibbons RN, BSN Unit 4E- RN Care Coordinator  [phone]  Patient Details  Name: Javarie Crisp MRN: [id number] Date of Birth: 01-02-1966  Subjective/Objective:   Pt admitted with bil PE and DVT                 Action/Plan: PTA Pt lived at home, independent, referral received for [place] needs- spoke with pt at bedside- confirmed that pt has no insurance- pt reports that he is working with [facility] on needed paperwork for discount and also [place] for orange card. Pt states he has upcoming appointments with [facility] for both orange card and with his PCP. Per bedside RN - MD to go with Xarelto- CM has provided pt with 30 day free card and also with pt assistance application- pt voices understanding to fill out his part and take with him to his upcoming appointment so that [facility] can assist him with application for further assistance. Pt gets his meds from [facility] for either $4 or $10 cost.   Expected Discharge Date:  10/06/17                Expected Discharge Plan:  Home/Self Care  In-House Referral:     Discharge planning Services  CM Consult, Medication Assistance  Post Acute Care Choice:    Choice offered to:     DME Arranged:    DME Agency:     HH Arranged:    HH Agency:     Status of Service:  In process, will continue to follow  If discussed at Long Length of Stay Meetings, dates discussed:    Discharge Disposition: home/self care   Additional Comments:  Dawayne Patricia, RN 10/06/2017, 12:02 PM

## 2017-10-07 LAB — BETA-2-GLYCOPROTEIN I ABS, IGG/M/A: Beta-2 Glyco I IgG: <9 GPI IgG units (ref 0–20)

## 2017-10-07 LAB — FACTOR 5 LEIDEN

## 2017-10-08 ENCOUNTER — Ambulatory Visit: Payer: Self-pay | Admitting: Family Medicine

## 2017-10-08 ENCOUNTER — Telehealth: Payer: Self-pay | Admitting: Family Medicine

## 2017-10-08 LAB — PROTHROMBIN GENE MUTATION

## 2017-10-08 LAB — DRVVT CONFIRM: DRVVT CONFIRM: 1.2 ratio (ref 0.8–1.2)

## 2017-10-08 LAB — LUPUS ANTICOAGULANT PANEL
DRVVT: 84.5 s — ABNORMAL HIGH (ref 0.0–47.0)
PTT Lupus Anticoagulant: 50.1 s (ref 0.0–51.9)

## 2017-10-08 LAB — PROTEIN C ACTIVITY: Protein C Activity: 94 % (ref 73–180)

## 2017-10-08 LAB — PROTEIN S ACTIVITY: Protein S Activity: 40 % — ABNORMAL LOW (ref 63–140)

## 2017-10-08 LAB — PROTEIN C, TOTAL: PROTEIN C, TOTAL: 93 % (ref 60–150)

## 2017-10-08 LAB — DRVVT MIX: dRVVT Mix: 47.8 s — ABNORMAL HIGH (ref 0.0–47.0)

## 2017-10-08 NOTE — Telephone Encounter (Signed)
Patient dropped off paperwork.

## 2017-10-09 NOTE — Discharge Summary (Signed)
Physician Discharge Summary  Terry Poole XHB:[id number] DOB: [birth year] DOA: 10/02/2017  PCP: Terry Rakes, MD  Admit date: 10/02/2017 Discharge date: 10/06/17  Admitted From: Home.  Disposition:  HOme.   Recommendations for Outpatient Follow-up:  1. Follow up with PCP in 1-2 weeks 2. Please obtain BMP/CBC in one week 3. Please follow up with PCP regarding the hypercoagulable work up.  4. Recommending repeat  CT abd and pelvis in 3 to 4 weeks to evaluate for resolution of the inflammatory changes.     Discharge Condition:stable.  CODE STATUS:full code.  Diet recommendation: Heart Healthy Brief/Interim Summary:  52 year old male w/ a hx of HTN and HLDwho was discharged 8/20 after treatmentfor AKI due to recent NSAID use. He returned w/ aseveral months history of swelling in his legs, progressively worse in thepreceding3 days.   In the ED venous duplex revealed extensiveacute DVT involvingthedistal femoral, popliteal, posterior tibial, peroneal,and gastrocnemousveins.CT angio chestwas alsopositive forBPE. Discharge Diagnoses:  Active Problems:   Pulmonary embolism (HCC)   Bilateral PE, and acute DVT in the right RLL On heparin gtt, transitioned to oral NOAC, once CT abd and pelvis is done and malignancy is ruled out.   Pain control for the RLL pain.  Echocardiogram reviewed and showed mild elevated PA pressures.  Good LVEF. No diastolic dysfunction.   Inflammatory changes in the spleen and pancreas: Differential include malignancy vs inflammation.  Further work up with CT abd and pelvis with contrast.  CT abd and pelvis shows inflammation around the pancreas, recommend repeat ing the CT in 3 to 4 weeks.    Acute pancreatitis; abd pain improved. Able to tolerate soft diet.  Recommend checking CT abd and pelvis in 4 weeks.   Hypokalemia  REPLACED.    Type 2 DM CBG (last 3)  RecentLabs(last2labs)       Recent Labs    10/05/17 0636  10/05/17 1150 10/05/17 1625  GLUCAP 220* 82 225*     Resume home meds.    Gout:  No flare up.    Tobacco abuse  Counseled.    Mild leukocytosis: Probably reactive.  Resolved.     Normocytic anemia: Hemoglobin stable around 10 on anti coagulation.    Discharge Instructions  Discharge Instructions    Diet - low sodium heart healthy   Complete by:  As directed    Discharge instructions   Complete by:  As directed    Please follow up with PCP in one week.  Please follow up with a repeat CT abd and pelvis to make sure the inflammation of the pancreas is resolved.     Allergies as of 10/06/2017      Reactions   Vicodin [hydrocodone-acetaminophen] Hives   Patient stated he is no longer allergic to vicodin, patient stated that he was allergic about 12 years ago.       Medication List    STOP taking these medications   metroNIDAZOLE 500 MG tablet Commonly known as:  FLAGYL     TAKE these medications   pantoprazole 40 MG tablet Commonly known as:  PROTONIX Take 1 tablet (40 mg total) by mouth 2 (two) times daily.   Rivaroxaban 15 MG Tabs tablet Commonly known as:  XARELTO Take 1 tablet (15 mg total) by mouth 2 (two) times daily with a meal for 21 days.   rivaroxaban 20 MG Tabs tablet Commonly known as:  XARELTO Take 1 tablet (20 mg total) by mouth daily with supper. Start taking on:  10/29/2017  Follow-up Information    Terry Rakes, MD Follow up.   Specialty:  Family Medicine Contact information: [address] [place] [address] [phone]          Allergies  Allergen Reactions  . Vicodin [Hydrocodone-Acetaminophen] Hives    Patient stated he is no longer allergic to vicodin, patient stated that he was allergic about 12 years ago.     Consultations:  None.    Procedures/Studies: Dg Chest 2 View  Result Date: 09/24/2017 CLINICAL DATA:  Chest pain for several days recently discharged from hospital for same  issue, pain persists EXAM: CHEST - 2 VIEW COMPARISON:  09/21/2017 FINDINGS: Normal heart size, mediastinal contours, and pulmonary vascularity. Lungs clear. No pleural effusion or pneumothorax. Scattered mild multilevel endplate spur formation thoracic spine. Bones otherwise unremarkable. IMPRESSION: No acute abnormalities. Electronically Signed   By: Lavonia Dana M.D.   On: 09/24/2017 13:55   Dg Chest 2 View  Result Date: 09/21/2017 CLINICAL DATA:  Chest pain radiating to bilateral shoulder for 2 weeks. History of smoking, bronchitis. EXAM: CHEST - 2 VIEW COMPARISON:  Chest radiograph July 12, 2017 and CT chest Jun 21, 2017. FINDINGS: Cardiomediastinal silhouette is normal. No pleural effusions or focal consolidations. Trachea projects midline and there is no pneumothorax. Soft tissue planes and included osseous structures are non-suspicious. Mild degenerative change of the thoracic spine. IMPRESSION: No acute cardiopulmonary process. Electronically Signed   By: Elon Alas M.D.   On: 09/21/2017 20:23   Ct Angio Chest Pe W/cm &/or Wo Cm  Result Date: 10/02/2017 CLINICAL DATA:  52 year old male with a history of DVT and leg swelling. EXAM: CT ANGIOGRAPHY CHEST WITH CONTRAST TECHNIQUE: Multidetector CT imaging of the chest was performed using the standard protocol during bolus administration of intravenous contrast. Multiplanar CT image reconstructions and MIPs were obtained to evaluate the vascular anatomy. CONTRAST:  17mL ISOVUE-370 IOPAMIDOL (ISOVUE-370) INJECTION 76% COMPARISON:  CT 06/21/2017 FINDINGS: Cardiovascular: Heart: No cardiomegaly. No pericardial fluid/thickening. No significant coronary calcifications. There is no measured inversion of the RV/LV ratio, with no septal inversion. No evidence of right-sided heart strain by CT imaging. Aorta: Unremarkable course, caliber, contour of the thoracic aorta. No aneurysm or dissection flap. No periaortic fluid. Pulmonary arteries: Central filling  defects of the bilateral lower lobes involving segmental and subsegmental vessels. No filling defects of the lobar or the main pulmonary artery. Mediastinum/Nodes: No mediastinal adenopathy. Unremarkable appearance of the thoracic esophagus. Unremarkable appearance of the thoracic inlet and thyroid. Lungs/Pleura: Central airways are clear. No pleural effusion. No confluent airspace disease. No pneumothorax. Upper Abdomen: Vague inflammatory changes/edema of the left upper quadrant at the hilum of the spleen and involving distal pancreas. Multiple lymph nodes are present at this location. Musculoskeletal: Degenerative changes of the spine. No acute displaced fracture. Review of the MIP images confirms the above findings. IMPRESSION: CT positive for bilateral pulmonary emboli involving lower lobe segmental and subsegmental branches. Inflammatory changes of the left upper quadrant at the hilum of the spleen and involving distal pancreas. This may reflect acute pancreatitis, however, given the presence of pulmonary emboli, malignancy is also considered. Further evaluation with contrast-enhanced abdominal CT recommended. These results were called by telephone at the time of interpretation on 10/02/2017 at 10:34 am to Dr. Dalia Heading , who verbally acknowledged these results. Electronically Signed   By: Corrie Mckusick D.O.   On: 10/02/2017 10:35   Ct Abdomen Pelvis W Contrast  Result Date: 10/05/2017 CLINICAL DATA:  Pulmonary emboli seen on  recent chest CT with abnormal appearance in the left upper quadrant of the abdomen. Rule out malignancy. EXAM: CT ABDOMEN AND PELVIS WITH CONTRAST TECHNIQUE: Multidetector CT imaging of the abdomen and pelvis was performed using the standard protocol following bolus administration of intravenous contrast. CONTRAST:  112mL ISOVUE-300 IOPAMIDOL (ISOVUE-300) INJECTION 61% COMPARISON:  Chest CT 10/02/2017 FINDINGS: Lower chest: Normal heart size without pericardial effusion. Mild  thickening of the distal esophagus cannot exclude mild esophagitis. No small segmental or subsegmental pulmonary emboli noted on images through the lung bases though the study is not tailored toward assessment of the pulmonary arteries on this exam. Hepatobiliary: Homogeneous attenuation of the liver. Hyperdensity along the dependent aspect of the nondistended gallbladder may represent an adjacent vessel and less likely gallstones. Pancreas: There is peripancreatic edema more so along the body and tail of the pancreas raising suspicion pancreatitis. A discrete mass is not identified however there is a slightly bulbous appearance to the tail of the pancreas that may simply reflect inflammatory change. No necrosis is identified. No ductal dilatation is seen. Short-term interval follow-up to assure resolution is recommended. Alternatively should findings persist, consider pancreatic protocol MRI or CT with IV contrast. Spleen: No splenomegaly or mass. Adrenals/Urinary Tract: Normal bilateral adrenal glands. Water attenuating cyst in the lower pole the right kidney measuring 16 mm in diameter. Stomach/Bowel: Minimal colonic diverticulosis without acute diverticulitis. The stomach is decompressed in appearance. The duodenal bulb and sweep as well as ligament of Treitz are unremarkable. The appendix is normal as is the distal terminal ileum. No small bowel dilatation or inflammation. Moderate stool retention within the colon without inflammation. Vascular/Lymphatic: Nonaneurysmal abdominal aorta. A few small peripancreatic mildly enlarged lymph nodes are identified measuring up to 1 cm short axis in the left upper quadrant, series 3/19 and 20 that may be reactive in etiology. Small subcentimeter mesenteric lymph nodes without pathologic enlargement. No pelvic sidewall adenopathy. No inguinal adenopathy. Reproductive: Normal prostate and seminal vesicles. Other: Small amount of free fluid in the pelvis. Fat containing  umbilical hernia. Musculoskeletal: No aggressive osseous lesions. Thoracolumbar spondylosis with multilevel degenerative disc disease and endplate spurring greatest at L4-5. Multilevel facet arthrosis of the lumbar spine. IMPRESSION: 1. Peripancreatic edema and mild pancreatic tail enlargement, nonspecific but more commonly associated with pancreatitis is identified. No discrete mass is apparent. There are 1 cm short axis peripancreatic lymph nodes in the left upper quadrant possibly reactive. Short-term interval follow-up to assure resolution is recommended. Dedicated pancreatic CT or MRI with IV contrast may also help identify occult lesions. 2. Colonic diverticulosis without acute diverticulitis. 3. Thoracolumbar spondylosis. Electronically Signed   By: Ashley Royalty M.D.   On: 10/05/2017 00:21   US Renal  Result Date: 09/22/2017 CLINICAL DATA:  Initial evaluation for acute renal injury. EXAM: RENAL / URINARY TRACT ULTRASOUND COMPLETE COMPARISON:  None. FINDINGS: Right Kidney: Length: 10.8 cm. Echogenicity within normal limits. No hydronephrosis. 1.4 x 1.5 x 1.7 cm simple cyst at the lower pole. Left Kidney: Length: 9.3 cm. Echogenicity within normal limits. No mass or hydronephrosis visualized. Bladder: Appears normal for degree of bladder distention. IMPRESSION: 1. Negative renal ultrasound.  No hydronephrosis. 2. 1.7 cm simple right renal cyst. Electronically Signed   By: Jeannine Boga M.D.   On: 09/22/2017 03:45       Subjective: No chest pain , sob and abdominal pain improved  Discharge Exam: Vitals:   10/06/17 0500 10/06/17 0829  BP: (!) 136/92 (!) 132/93  Pulse: 72 69  Resp:  19 18  Temp: 98.3 F (36.8 C) 98.1 F (36.7 C)  SpO2: 98% 100%   Vitals:   10/05/17 1938 10/06/17 0500 10/06/17 0829 10/06/17 1100  BP: 139/84 (!) 136/92 (!) 132/93   Pulse:  72 69   Resp: (!) 27 19 18    Temp: 98.2 F (36.8 C) 98.3 F (36.8 C) 98.1 F (36.7 C)   TempSrc: Oral Oral Oral   SpO2:  100% 98% 100%   Weight:    98.4 kg  Height:    5\' 9"  (1.753 m)    General: Pt is alert, awake, not in acute distress Cardiovascular: RRR, S1/S2 +, no rubs, no gallops Respiratory: CTA bilaterally, no wheezing, no rhonchi Abdominal: Soft, NT, ND, bowel sounds + Extremities: no edema, no cyanosis    The results of significant diagnostics from this hospitalization (including imaging, microbiology, ancillary and laboratory) are listed below for reference.     Microbiology: No results found for this or any previous visit (from the past 240 hour(s)).   Labs: BNP (last 3 results) Recent Labs    06/21/17 0457 10/01/17 2152  BNP 71.7 29.7   Basic Metabolic Panel: Recent Labs  Lab 10/03/17 0030 10/04/17 0254 10/06/17 1248  NA 137 137 136  K 3.4* 3.4* 4.3  CL 104 104 101  CO2 24 26 24   GLUCOSE 225* 147* 170*  BUN 7 6 6   CREATININE 1.07 0.94 1.09  CALCIUM 7.9* 8.1* 9.0  MG  --  1.7  --    Liver Function Tests: Recent Labs  Lab 10/04/17 0254  AST 14*  ALT 18  ALKPHOS 103  BILITOT 0.8  PROT 7.0  ALBUMIN 2.6*   Recent Labs  Lab 10/02/17 1227  LIPASE 25   No results for input(s): AMMONIA in the last 168 hours. CBC: Recent Labs  Lab 10/03/17 0030 10/04/17 0254 10/05/17 0351 10/06/17 0321  WBC 11.2* 8.6 8.2 8.3  HGB 10.4* 10.6* 10.9* 11.0*  HCT 32.5* 32.8* 34.1* 33.9*  MCV 86.0 85.9 85.0 85.8  PLT 183 201 237 264   Cardiac Enzymes: No results for input(s): CKTOTAL, CKMB, CKMBINDEX, TROPONINI in the last 168 hours. BNP: Invalid input(s): POCBNP CBG: Recent Labs  Lab 10/05/17 1150 10/05/17 1625 10/05/17 2146 10/06/17 0604 10/06/17 1127  GLUCAP 82 225* 110* 127* 97   D-Dimer No results for input(s): DDIMER in the last 72 hours. Hgb A1c No results for input(s): HGBA1C in the last 72 hours. Lipid Profile No results for input(s): CHOL, HDL, LDLCALC, TRIG, CHOLHDL, LDLDIRECT in the last 72 hours. Thyroid function studies No results for input(s):  TSH, T4TOTAL, T3FREE, THYROIDAB in the last 72 hours.  Invalid input(s): FREET3 Anemia work up No results for input(s): VITAMINB12, FOLATE, FERRITIN, TIBC, IRON, RETICCTPCT in the last 72 hours. Urinalysis    Component Value Date/Time   COLORURINE YELLOW 09/22/2017 0445   APPEARANCEUR HAZY (A) 09/22/2017 0445   LABSPEC 1.029 09/22/2017 0445   PHURINE 5.0 09/22/2017 0445   GLUCOSEU NEGATIVE 09/22/2017 0445   HGBUR SMALL (A) 09/22/2017 0445   BILIRUBINUR NEGATIVE 09/22/2017 0445   KETONESUR NEGATIVE 09/22/2017 0445   PROTEINUR 100 (A) 09/22/2017 0445   NITRITE NEGATIVE 09/22/2017 0445   LEUKOCYTESUR NEGATIVE 09/22/2017 0445   Sepsis Labs Invalid input(s): PROCALCITONIN,  WBC,  LACTICIDVEN Microbiology No results found for this or any previous visit (from the past 240 hour(s)).   Time coordinating discharge: 32  minutes  SIGNED:   Hosie Poisson, MD  [facility] 10/09/2017, 8:36 AM  Pager   If 7PM-7AM, please contact night-coverage [email or web address] Password [email or web address]

## 2017-10-10 ENCOUNTER — Emergency Department (HOSPITAL_COMMUNITY)
Admission: EM | Admit: 2017-10-10 | Discharge: 2017-10-11 | Disposition: A | Discharge status: Home / Self Care | Payer: Self-pay | Attending: Emergency Medicine | Admitting: Emergency Medicine

## 2017-10-10 ENCOUNTER — Encounter (HOSPITAL_COMMUNITY): Payer: Self-pay | Admitting: Emergency Medicine

## 2017-10-10 DIAGNOSIS — Z79899 Other long term (current) drug therapy: Secondary | ICD-10-CM | POA: Insufficient documentation

## 2017-10-10 DIAGNOSIS — I82491 Acute embolism and thrombosis of other specified deep vein of right lower extremity: Secondary | ICD-10-CM | POA: Insufficient documentation

## 2017-10-10 DIAGNOSIS — F17210 Nicotine dependence, cigarettes, uncomplicated: Secondary | ICD-10-CM | POA: Insufficient documentation

## 2017-10-10 DIAGNOSIS — I10 Essential (primary) hypertension: Secondary | ICD-10-CM | POA: Insufficient documentation

## 2017-10-10 DIAGNOSIS — M7989 Other specified soft tissue disorders: Secondary | ICD-10-CM

## 2017-10-10 DIAGNOSIS — I82411 Acute embolism and thrombosis of right femoral vein: Secondary | ICD-10-CM

## 2017-10-10 DIAGNOSIS — E119 Type 2 diabetes mellitus without complications: Secondary | ICD-10-CM | POA: Insufficient documentation

## 2017-10-10 HISTORY — DX: Acute embolism and thrombosis of unspecified deep veins of unspecified lower extremity: I82.409

## 2017-10-10 NOTE — ED Triage Notes (Signed)
Reports being diagnosed with a blood clot in the right leg a little over a week ago.  Started back to work and is having increased swelling and pain.

## 2017-10-11 NOTE — ED Provider Notes (Signed)
[facility] EMERGENCY DEPARTMENT Provider Note  CSN: [id number] Arrival date & time: 10/10/17 2238  Chief Complaint(s) Leg Swelling  HPI Terry Poole is a 52 y.o. male who was recently diagnosed with DVT and PEs on Xarelto presents to the emergency department with increased right lower extremity swelling.  Patient reports that while hospitalized and swelling in his right lower extremity had improved however after being discharged 1 week ago, he has been more ambulatory and noted that his swelling has increased.  He is endorsing associated discomfort.  States that he is compliant with his medication.  Denies any fevers or chills.  No chest pain or shortness of breath.  No abdominal pain.  HPI  Past Medical History Past Medical History:  Diagnosis Date  . Bronchitis   . Bronchitis   . DVT (deep venous thrombosis) ([place])   . Gout   . Hammertoe of second toe of right foot   . Smoker    Patient Active Problem List   Diagnosis Date Noted  . Pulmonary embolism ([place]) 10/02/2017  . AKI (acute kidney injury) ([place]) 09/22/2017  . Chest pain 09/22/2017  . Hypertension 11/19/2016  . Type 2 diabetes mellitus ([place]) 08/19/2016  . Hyperlipidemia 08/19/2016  . Back pain 05/13/2016  . Hammertoe of second toe of right foot 03/06/2016  . Onychomycosis 04/16/2015  . 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 Gout 03/28/2015   Home Medication(s) Prior to Admission medications   Medication Sig Start Date End Date Taking? Authorizing Provider  pantoprazole (PROTONIX) 40 MG tablet Take 1 tablet (40 mg total) by mouth 2 (two) times daily. 09/22/17   Thurnell Lose, MD  Rivaroxaban (XARELTO) 15 MG TABS tablet Take 1 tablet (15 mg total) by mouth 2 (two) times daily with a meal for 21 days. 10/07/17 10/28/17  Hosie Poisson, MD  rivaroxaban (XARELTO) 20 MG TABS tablet Take 1 tablet (20 mg total) by mouth daily with supper. 10/29/17   Hosie Poisson, MD                                                                                           Past Surgical History Past Surgical History:  Procedure Laterality Date  . HAMMER TOE SURGERY Right 03/10/2016   Procedure: 2ND RIGHT HAMMER TOE CORRECTION;  Surgeon: Landis Martins, DPM;  Location: [facility];  Service: Podiatry;  Laterality: Right;  . MASS EXCISION Right 03/10/2016   Procedure: EXCISION OF CALLUS 2ND RIGHT TOE;  Surgeon: Landis Martins, DPM;  Location: [facility];  Service: Podiatry;  Laterality: Right;  . NO PAST SURGERIES     Family History Family History  Problem Relation Age of Onset  . Heart disease Neg Hx   . Kidney disease Neg Hx     Social History Social History   Tobacco Use  . Smoking status: Current Every Day Smoker    Packs/day: 0.25    Types: Cigarettes  . Smokeless tobacco: Never Used  . Tobacco comment: 8 cigs daily  Substance Use Topics  . Alcohol use: No  . Drug use: No   Allergies Vicodin [hydrocodone-acetaminophen]  Review of Systems  Review of Systems All other systems are reviewed and are negative for acute change except as noted in the HPI  Physical Exam Vital Signs  I have reviewed the triage vital signs BP (!) 151/110   Pulse 76   Temp 98.1 F (36.7 C) (Oral)   Resp 17   Ht 5\' 9"  (1.753 m)   Wt 98.4 kg   SpO2 100%   BMI 32.05 kg/m   Physical Exam  Constitutional: He is oriented to person, place, and time. He appears well-developed and well-nourished. No distress.  HENT:  Head: Normocephalic and atraumatic.  Right Ear: External ear normal.  Left Ear: External ear normal.  Nose: Nose normal.  Mouth/Throat: Mucous membranes are normal. No trismus in the jaw.  Eyes: Conjunctivae and EOM are normal. No scleral icterus.  Neck: Normal range of motion and phonation normal.  Cardiovascular: Normal rate and regular rhythm.  Pulses:      Dorsalis pedis pulses are 2+ on the right side, and 2+ on the left side.    Pulmonary/Chest: Effort normal. No stridor. No respiratory distress.  Abdominal: He exhibits no distension.  Musculoskeletal: Normal range of motion. He exhibits no edema.  BLE edema R>L  Neurological: He is alert and oriented to person, place, and time.  Skin: He is not diaphoretic.  Psychiatric: He has a normal mood and affect. His behavior is normal.  Vitals reviewed.   ED Results and Treatments Labs (all labs ordered are listed, but only abnormal results are displayed) Labs Reviewed - No data to display                                                                                                                       EKG  EKG Interpretation  Date/Time:    Ventricular Rate:    PR Interval:    QRS Duration:   QT Interval:    QTC Calculation:   R Axis:     Text Interpretation:        Radiology No results found. Pertinent labs & imaging results that were available during my care of the patient were reviewed by me and considered in my medical decision making (see chart for details).  Medications Ordered in ED Medications - No data to display  Procedures Procedures  (including critical care time)  Medical Decision Making / ED Course I have reviewed the nursing notes for this encounter and the patient's prior records (if available in EHR or on provided paperwork).    Right lower extremity swelling in the setting of DVT.  Patient is on anticoagulation and compliant.  Increased swelling likely due to dependency.  It is still early in the patient's treatment and a repeat ultrasound would not be beneficial.  Instructed to continue taking medication and follow-up with his primary care provider.  No evidence of cellulitis.  Pulses intact.  The patient appears reasonably screened and/or stabilized for discharge and I doubt any other medical condition  or other [facility] requiring further screening, evaluation, or treatment in the ED at this time prior to discharge.  The patient is safe for discharge with strict return precautions.   Final Clinical Impression(s) / ED Diagnoses Final diagnoses:  Deep vein thrombosis (DVT) of femoral vein of right lower extremity, unspecified chronicity (HCC)  Leg swelling    Disposition: Discharge  Condition: Good  I have discussed the results, Dx and Tx plan with the patient who expressed understanding and agree(s) with the plan. Discharge instructions discussed at great length. The patient was given strict return precautions who verbalized understanding of the instructions. No further questions at time of discharge.    ED Discharge Orders    None       Follow Up: [name], MD [place] [address] [phone]  Call in 1 day for additional recommendations     This chart was dictated using voice recognition software.  Despite best efforts to proofread,  errors can occur which can change the documentation meaning.   Fatima Blank, MD 10/11/17 [phone]

## 2017-10-13 ENCOUNTER — Telehealth: Payer: Self-pay | Admitting: Family Medicine

## 2017-10-13 ENCOUNTER — Ambulatory Visit: Payer: Self-pay | Attending: Family Medicine | Admitting: Family Medicine

## 2017-10-13 ENCOUNTER — Encounter: Payer: Self-pay | Admitting: Family Medicine

## 2017-10-13 VITALS — BP 156/99 | HR 85 | Temp 97.6°F | Wt 223.6 lb

## 2017-10-13 DIAGNOSIS — Z7901 Long term (current) use of anticoagulants: Secondary | ICD-10-CM | POA: Insufficient documentation

## 2017-10-13 DIAGNOSIS — D6859 Other primary thrombophilia: Secondary | ICD-10-CM | POA: Insufficient documentation

## 2017-10-13 DIAGNOSIS — E119 Type 2 diabetes mellitus without complications: Secondary | ICD-10-CM | POA: Insufficient documentation

## 2017-10-13 DIAGNOSIS — R935 Abnormal findings on diagnostic imaging of other abdominal regions, including retroperitoneum: Secondary | ICD-10-CM

## 2017-10-13 DIAGNOSIS — Z86718 Personal history of other venous thrombosis and embolism: Secondary | ICD-10-CM | POA: Insufficient documentation

## 2017-10-13 DIAGNOSIS — I2699 Other pulmonary embolism without acute cor pulmonale: Secondary | ICD-10-CM | POA: Insufficient documentation

## 2017-10-13 DIAGNOSIS — I824Z1 Acute embolism and thrombosis of unspecified deep veins of right distal lower extremity: Secondary | ICD-10-CM

## 2017-10-13 DIAGNOSIS — M109 Gout, unspecified: Secondary | ICD-10-CM | POA: Insufficient documentation

## 2017-10-13 DIAGNOSIS — Z86711 Personal history of pulmonary embolism: Secondary | ICD-10-CM | POA: Insufficient documentation

## 2017-10-13 DIAGNOSIS — E785 Hyperlipidemia, unspecified: Secondary | ICD-10-CM | POA: Insufficient documentation

## 2017-10-13 DIAGNOSIS — R600 Localized edema: Secondary | ICD-10-CM | POA: Insufficient documentation

## 2017-10-13 DIAGNOSIS — I82409 Acute embolism and thrombosis of unspecified deep veins of unspecified lower extremity: Secondary | ICD-10-CM | POA: Insufficient documentation

## 2017-10-13 DIAGNOSIS — K5730 Diverticulosis of large intestine without perforation or abscess without bleeding: Secondary | ICD-10-CM | POA: Insufficient documentation

## 2017-10-13 LAB — GLUCOSE, POCT (MANUAL RESULT ENTRY): POC Glucose: 198 mg/dl — AB (ref 70–99)

## 2017-10-13 MED ORDER — RIVAROXABAN 20 MG PO TABS: 20.0000 mg | ORAL_TABLET | Freq: Every day | ORAL | 2 refills | Status: DC

## 2017-10-13 NOTE — Telephone Encounter (Signed)
Patient would like a doctor's note stating how long he will be out, states the note that was given to him will not work for his employer.

## 2017-10-13 NOTE — Progress Notes (Signed)
Subjective:  Patient ID: Terry Poole, male    DOB: [birth year]  Age: 52 y.o. MRN: [id number]  CC: Hospitalization Follow-up   HPI Terry Poole  is a 52 year old male with a history of type 2 diabetes mellitus (A1c 6.5, diet controlled) Hyperlipidemia, Gout, agonist with bilateral PE and right lower extremity DVT on 10/03/2017 and currently on anticoagulation with Xarelto. 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 He presents today complaining of right leg edema which has worsened ever since he returned to work at [employer] where he stands for prolonged hours.  He is concerned that persisting edema will affect his legs for the rest of his life and he would like to be out of work indefinitely and applying for disability. He denies pain in his legs, shortness of breath He did have an abdominal and pelvis CT during the most recent ED visit which revealed findings below:   IMPRESSION: 1. Peripancreatic edema and mild pancreatic tail enlargement, nonspecific but more commonly associated with pancreatitis is identified. No discrete mass is apparent. There are 1 cm short axis peripancreatic lymph nodes in the left upper quadrant possibly reactive. Short-term interval follow-up to assure resolution is recommended. Dedicated pancreatic CT or MRI with IV contrast may also help identify occult lesions. 2. Colonic diverticulosis without acute diverticulitis. 3. Thoracolumbar spondylosis.  Past Medical History:  Diagnosis Date  . Bronchitis   . Bronchitis   . DVT (deep venous thrombosis) ([place])   . Gout   . Hammertoe of second toe of right foot   . Smoker     Past Surgical History:  Procedure Laterality Date  . HAMMER TOE SURGERY Right 03/10/2016   Procedure: 2ND RIGHT HAMMER TOE CORRECTION;  Surgeon: Landis Martins, DPM;  Location: [facility];  Service: Podiatry;  Laterality: Right;  . MASS EXCISION Right 03/10/2016   Procedure: EXCISION OF CALLUS 2ND RIGHT TOE;  Surgeon: Landis Martins, DPM;  Location: [facility];  Service: Podiatry;  Laterality: Right;  . NO PAST SURGERIES      Allergies  Allergen Reactions  . Vicodin [Hydrocodone-Acetaminophen] Hives    Patient stated he is no longer allergic to vicodin, patient stated that he was allergic about 12 years ago.      Outpatient Medications Prior to Visit  Medication Sig Dispense Refill  . pantoprazole (PROTONIX) 40 MG tablet Take 1 tablet (40 mg total) by mouth 2 (two) times daily. 60 tablet 0  . Rivaroxaban (XARELTO) 15 MG TABS tablet Take 1 tablet (15 mg total) by mouth 2 (two) times daily with a meal for 21 days. 42 tablet 0  . [START ON 10/29/2017] rivaroxaban (XARELTO) 20 MG TABS tablet Take 1 tablet (20 mg total) by mouth daily with supper. 30 tablet 0   No facility-administered medications prior to visit.     ROS Review of Systems  Constitutional: Negative for activity change and appetite change.  HENT: Negative for sinus pressure and sore throat.   Eyes: Negative for visual disturbance.  Respiratory: Negative for cough, chest tightness and shortness of breath.   Cardiovascular: Positive for leg swelling. Negative for chest pain.  Gastrointestinal: Negative for abdominal distention, abdominal pain, constipation and diarrhea.  Endocrine: Negative.   Genitourinary: Negative for dysuria.  Musculoskeletal: Negative for joint swelling and myalgias.  Skin: Negative for rash.  Allergic/Immunologic: Negative.   Neurological: Negative for weakness,  light-headedness and numbness.  Psychiatric/Behavioral: Negative for dysphoric mood and suicidal ideas.    Objective:  BP (!) 156/99   Pulse 85   Temp 97.6 F (36.4 C) (Oral)   Wt 223 lb 9.6 oz (101.4 kg)   SpO2 100%   BMI 33.02 kg/m   BP/Weight 10/13/2017 10/11/2017 0/08/8673    Systolic BP 449 201 -  Diastolic BP 99 007 -  Wt. (Lbs) 223.6 - 217  BMI 33.02 - 32.05      Physical Exam  Constitutional: He is oriented to person, place, and time. He appears well-developed and well-nourished.  HENT:  Right Ear: External ear normal.  Left Ear: External ear normal.  Mouth/Throat: Oropharynx is clear and moist.  Cardiovascular: Normal rate, normal heart sounds and intact distal pulses.  No murmur heard. Pulmonary/Chest: Effort normal and breath sounds normal. He has no wheezes. He has no rales. He exhibits no tenderness.  Abdominal: Soft. Bowel sounds are normal. He exhibits no distension and no mass. There is no tenderness.  Musculoskeletal: Normal range of motion. He exhibits edema (RLE 3+). He exhibits no tenderness.  Neurological: He is alert and oriented to person, place, and time.  Skin: Skin is warm and dry.  Psychiatric: He has a normal mood and affect.     Lab Results  Component Value Date   HGBA1C 6.5 (A) 09/24/2017   HGBA1C 6.5 09/24/2017   HGBA1C 6.5 (A) 09/24/2017   HGBA1C 6.5 09/24/2017     Assessment & Plan:   1. Type 2 diabetes mellitus without complication, without long-term current use of insulin (HCC) Controlled with A1c of 6.5 Dietary modifications - POCT glucose (manual entry)  2. Acute pulmonary embolism without acute cor pulmonale, unspecified pulmonary embolism type (HCC) Currently on Xarelto 15 mg twice daily which she will continue up until 9/26 after which he was switched to Xarelto 20 mg Consider hematology referral at next visit to determine duration of anticoagulation given functional protein C deficiency, elevated DRV VT and slightly abnormal anticardiolipin antibody. - rivaroxaban (XARELTO) 20 MG TABS tablet; Take 1 tablet (20 mg total) by mouth daily with supper.  Dispense: 30 tablet; Refill: 2  3. Acute deep vein thrombosis (DVT) of distal vein of right lower extremity (HCC) Continues to experience right lower  extremity edema Advised use compression stockings His prolonged standing at work also precipitating edema; I have provided him a letter as requested to be out of work for 2 months but informed him I do not think pulmonary embolism and DVT are an indication to be out of work indefinitely which is unhappy with as he is planning to apply for disability due to this. - rivaroxaban (XARELTO) 20 MG TABS tablet; Take 1 tablet (20 mg total) by mouth daily with supper.  Dispense: 30 tablet; Refill: 2  4. Abnormal CT of the abdomen Inflammatory changes on CT abdomen vs malignancy Will repeat at next visit in 3 weeks   Meds ordered this encounter  Medications  . rivaroxaban (XARELTO) 20 MG TABS tablet    Sig: Take 1 tablet (20 mg total) by mouth daily with supper.    Dispense:  30 tablet    Refill:  2    Follow-up: Return in about 3 weeks (around 11/03/2017) for follow up of CT abdomen.   [name] MD

## 2017-10-19 ENCOUNTER — Encounter: Payer: Self-pay | Admitting: Physician Assistant

## 2017-10-19 ENCOUNTER — Ambulatory Visit (INDEPENDENT_AMBULATORY_CARE_PROVIDER_SITE_OTHER): Payer: Self-pay | Admitting: Physician Assistant

## 2017-10-19 VITALS — BP 138/86 | HR 82 | Ht 69.0 in | Wt 221.0 lb

## 2017-10-19 DIAGNOSIS — Z1212 Encounter for screening for malignant neoplasm of rectum: Secondary | ICD-10-CM

## 2017-10-19 DIAGNOSIS — R1013 Epigastric pain: Secondary | ICD-10-CM

## 2017-10-19 DIAGNOSIS — Z7901 Long term (current) use of anticoagulants: Secondary | ICD-10-CM

## 2017-10-19 DIAGNOSIS — R935 Abnormal findings on diagnostic imaging of other abdominal regions, including retroperitoneum: Secondary | ICD-10-CM

## 2017-10-19 DIAGNOSIS — Z1211 Encounter for screening for malignant neoplasm of colon: Secondary | ICD-10-CM

## 2017-10-19 MED ORDER — PANTOPRAZOLE SODIUM 40 MG PO TBEC: 40.0000 mg | DELAYED_RELEASE_TABLET | Freq: Two times a day (BID) | ORAL | 3 refills | Status: DC

## 2017-10-19 MED FILL — PANTOPRAZOLE SOD DR 40 MG T: 40 | 30 days supply | Qty: 60 | Fill #0

## 2017-10-19 NOTE — Patient Instructions (Addendum)
You will be due for a recall colonoscopy in 04/2018. We will send you a reminder in the mail when it gets closer to that time.   We have sent the following medications to your pharmacy for you to pick up at your convenience: Pantoprazole

## 2017-10-19 NOTE — Progress Notes (Signed)
Reviewed and agree with initial management plan.  Caitlinn Klinker T. Damarian Priola, MD FACG 

## 2017-10-19 NOTE — Progress Notes (Signed)
Chief Complaint: Epigastric pain, GERD  HPI:    [name] is a 52 year old African-American male with a new diagnosis of DVT and bilateral PE on Xarelto, who was referred to me by Antony Blackbird, MD for a complaint of epigastric pain and reflux.      10/02/2017 hospitalization for acute DVT as well as bilateral PE.  Also complained of epigastric pain and had CT abdomen pelvis which showed peripancreatic edema and mild pancreatic tail enlargement which was nonspecific marker but more commonly associated with pancreatitis.  No discrete mass.  It was recommended that he have short-term interval follow-up to assure resolution as there is also a 1 cm short axis peripancreatic lymph node in the left upper quadrant possibly reactive.    10/10/2017 patient return to the ER with continued leg swelling.  Xarelto was increased from 15 to 20 mg.    Today, patient is a poor historian.  He tells me that he was taking Naproxen 500 mg 2-3 times a day for 2 months for what they thought was a "foot strain" and he started with increased "acid" and diarrhea as well as an epigastric burning.  He finally went to the ER as above with diagnosis of acute DVT and bilateral pulmonary emboli and was started on Pantoprazole 40 mg twice daily.  Patient tells me since he has been on this medicine he has had no further abdominal pain.  Associated symptoms include some nausea occasionally per patient.  Believes this is related to his diet.    Medical history positive for still being on Xarelto 20 mg, patient is following with his PCP September 24 and per their last notes it looks like he may be arranged with cardiology to discuss how long he needs to be on Xarelto.  Apparently also has a protein C deficiency.    Social history positive for working 2 jobs, neither of which have insurance.  Patient is currently working on getting the orange card [facility].    Denies fever, chills, weight loss,  anorexia, vomiting, heartburn, reflux, change in bowel habits or abdominal pain.  Past Medical History:  Diagnosis Date  . Bronchitis   . Bronchitis   . DVT (deep venous thrombosis) ([place])   . Gout   . Hammertoe of second toe of right foot   . Smoker     Past Surgical History:  Procedure Laterality Date  . HAMMER TOE SURGERY Right 03/10/2016   Procedure: 2ND RIGHT HAMMER TOE CORRECTION;  Surgeon: Landis Martins, DPM;  Location: [facility];  Service: Podiatry;  Laterality: Right;  . MASS EXCISION Right 03/10/2016   Procedure: EXCISION OF CALLUS 2ND RIGHT TOE;  Surgeon: Landis Martins, DPM;  Location: [facility];  Service: Podiatry;  Laterality: Right;  . NO PAST SURGERIES      Current Outpatient Medications  Medication Sig Dispense Refill  . pantoprazole (PROTONIX) 40 MG tablet Take 1 tablet (40 mg total) by mouth 2 (two) times daily. 60 tablet 3  . Rivaroxaban (XARELTO) 15 MG TABS tablet Take 1 tablet (15 mg total) by mouth 2 (two) times daily with a meal for 21 days. 42 tablet 0   No current facility-administered medications for this visit.     Allergies as of 10/19/2017 - Review Complete 10/19/2017  Allergen Reaction Noted  . Vicodin [hydrocodone-acetaminophen] Hives 12/01/2011    Family History  Problem Relation Age of Onset  . Heart disease Neg Hx   .  Kidney disease Neg Hx     Social History   Socioeconomic History  . Marital status: Single    Spouse name: Not on file  . Number of children: Not on file  . Years of education: Not on file  . Highest education level: Not on file  Occupational History  . Not on file  Social Needs  . Financial resource strain: Not on file  . Food insecurity:    Worry: Not on file    Inability: Not on file  . Transportation needs:    Medical: Not on file    Non-medical: Not on file  Tobacco Use  . Smoking status: Current Every Day Smoker    Packs/day: 0.25    Types: Cigarettes  . Smokeless tobacco:  Never Used  . Tobacco comment: 8 cigs daily  Substance and Sexual Activity  . Alcohol use: No  . Drug use: No  . Sexual activity: Not on file  Lifestyle  . Physical activity:    Days per week: Not on file    Minutes per session: Not on file  . Stress: Not on file  Relationships  . Social connections:    Talks on phone: Not on file    Gets together: Not on file    Attends religious service: Not on file    Active member of club or organization: Not on file    Attends meetings of clubs or organizations: Not on file    Relationship status: Not on file  . Intimate partner violence:    Fear of current or ex partner: Not on file    Emotionally abused: Not on file    Physically abused: Not on file    Forced sexual activity: Not on file  Other Topics Concern  . Not on file  Social History Narrative  . Not on file    Review of Systems:    Constitutional: No weight loss, fever or chills Skin: No rash  Cardiovascular: No chest pain Respiratory: No SOB Gastrointestinal: See HPI and otherwise negative Genitourinary: No dysuria  Neurological: No headache, dizziness or syncope Musculoskeletal: No new muscle or joint pain Hematologic: No bleeding or bruising Psychiatric: No history of depression or anxiety   Physical Exam:  Vital signs: BP 138/86   Pulse 82   Ht 5\' 9"  (1.753 m)   Wt 221 lb (100.2 kg)   BMI 32.64 kg/m   Constitutional:   Pleasant AA male appears to be in NAD, Well developed, Well nourished, alert and cooperative Head:  Normocephalic and atraumatic. Eyes:   PEERL, EOMI. No icterus. Conjunctiva pink. Ears:  Normal auditory acuity. Neck:  Supple Throat: Oral cavity and pharynx without inflammation, swelling or lesion.  Respiratory: Respirations even and unlabored. Lungs clear to auscultation bilaterally.   No wheezes, crackles, or rhonchi.  Cardiovascular: Normal S1, S2. No MRG. Regular rate and rhythm. No peripheral edema, cyanosis or pallor.  Gastrointestinal:   Soft, nondistended, nontender. No rebound or guarding. Normal bowel sounds. No appreciable masses or hepatomegaly. Rectal:  Not performed.  Msk:  Symmetrical without gross deformities. Without edema, no deformity or joint abnormality.  Neurologic:  Alert and  oriented x4;  grossly normal neurologically.  Skin:   Dry and intact without significant lesions or rashes. Psychiatric: Demonstrates good judgement and reason without abnormal affect or behaviors.  RELEVANT LABS AND IMAGING: CBC    Component Value Date/Time   WBC 8.3 10/06/2017 0321   RBC 3.95 (L) 10/06/2017 0321   HGB 11.0 (L)  10/06/2017 0321   HGB 14.0 09/24/2017 1106   HCT 33.9 (L) 10/06/2017 0321   HCT 43.6 09/24/2017 1106   PLT 264 10/06/2017 0321   PLT 205 09/24/2017 1106   MCV 85.8 10/06/2017 0321   MCV 86 09/24/2017 1106   MCH 27.8 10/06/2017 0321   MCHC 32.4 10/06/2017 0321   RDW 14.2 10/06/2017 0321   RDW 15.1 09/24/2017 1106   LYMPHSABS 2.3 09/24/2017 1106   MONOABS 0.4 06/21/2017 0446   EOSABS 4.2 (H) 09/24/2017 1106   BASOSABS 0.0 09/24/2017 1106    CMP     Component Value Date/Time   NA 136 10/06/2017 1248   NA 139 09/24/2017 1106   K 4.3 10/06/2017 1248   CL 101 10/06/2017 1248   CO2 24 10/06/2017 1248   GLUCOSE 170 (H) 10/06/2017 1248   BUN 6 10/06/2017 1248   BUN 10 09/24/2017 1106   CREATININE 1.09 10/06/2017 1248   CREATININE 0.94 09/21/2015 1053   CALCIUM 9.0 10/06/2017 1248   PROT 7.0 10/04/2017 0254   PROT 7.7 09/24/2017 1106   ALBUMIN 2.6 (L) 10/04/2017 0254   ALBUMIN 3.7 09/24/2017 1106   AST 14 (L) 10/04/2017 0254   ALT 18 10/04/2017 0254   ALKPHOS 103 10/04/2017 0254   BILITOT 0.8 10/04/2017 0254   BILITOT 0.5 09/24/2017 1106   GFRNONAA >60 10/06/2017 1248   GFRNONAA >89 09/21/2015 1053   GFRAA >60 10/06/2017 1248   GFRAA >89 09/21/2015 1053    Assessment: 1.  Epigastric pain: Better today on Pantoprazole 40 mg twice daily 2.  Abnormal imaging of the pancreas: With  questions of inflammatory versus other change at time of recent hospitalization, they recommended close interval follow up 2.  Screening for colorectal cancer: Patient is 77 and never had a screening colonoscopy 4.  Anticoagulation: Recent diagnosis of bilateral PE and DVT, currently on Xarelto 20 mg, also protein C deficiency, patient may need to be on long-term anticoagulation  Plan: 1.  Patient will eventually need diagnostic EGD and screening colonoscopy.  Did discuss that we will wait on this for the next 4 to 6 months to see what decisions are made about his anticoagulation giving his new finding of DVT and bilateral pulmonary emboli for which he was just started on Xarelto 10/02/2017.  Patient verbalized understanding.  We will put him in recall for 4-6 months from now for repeat office visit to discuss. 2.  After reviewing imaging it appears patient would benefit from a repeat CT abdomen and pelvis to ensure the findings in the pancreas were inflammatory and nothing else.  Would recommend discussing this at follow-up. 3.  Refilled patient's Pantoprazole 40 mg twice daily, 30-60 minutes before breakfast and dinner #60 with 5 refills 4.  Patient was assigned to Dr. Fuller Plan today as he was the supervising physician.  He will follow with me or Dr. Fuller Plan in 4-6 months.  Ellouise Newer, PA-C [facility] 10/19/2017, 11:46 AM  Cc: Antony Blackbird, MD

## 2017-10-20 ENCOUNTER — Telehealth: Payer: Self-pay | Admitting: Family Medicine

## 2017-10-20 NOTE — Telephone Encounter (Signed)
Patient called wanting paperwork that states what he was in the hospital for as well as the documentation for the days he was in the hospital. Patient would also like paperwork that states the dates he has been seen in the Dr. And his diagnoses. Please follow up with patient.

## 2017-10-21 NOTE — Telephone Encounter (Signed)
Letter is ready for pick up. Letter is upfront in folder.

## 2017-10-22 ENCOUNTER — Telehealth: Payer: Self-pay | Admitting: Family Medicine

## 2017-10-22 MED ORDER — RIVAROXABAN 20 MG PO TABS: 20.0000 mg | ORAL_TABLET | Freq: Every day | ORAL | 2 refills | Status: DC

## 2017-10-22 NOTE — Telephone Encounter (Signed)
Done

## 2017-10-22 NOTE — Telephone Encounter (Signed)
Pt called to request a medication refill on -Rivaroxaban (XARELTO) 15 MG TABS tablet  To -[facility] pharmacy Please follow up

## 2017-10-23 MED FILL — XARELTO 20 MG TABLET: 20 | 30 days supply | Qty: 30 | Fill #0

## 2017-10-26 ENCOUNTER — Ambulatory Visit: Payer: Self-pay | Attending: Family Medicine

## 2017-10-27 ENCOUNTER — Encounter: Payer: Self-pay | Admitting: Family Medicine

## 2017-10-27 ENCOUNTER — Ambulatory Visit: Payer: Self-pay | Attending: Family Medicine | Admitting: Family Medicine

## 2017-10-27 VITALS — BP 136/79 | HR 80 | Temp 97.7°F | Ht 69.0 in | Wt 221.4 lb

## 2017-10-27 DIAGNOSIS — E785 Hyperlipidemia, unspecified: Secondary | ICD-10-CM | POA: Insufficient documentation

## 2017-10-27 DIAGNOSIS — E119 Type 2 diabetes mellitus without complications: Secondary | ICD-10-CM | POA: Insufficient documentation

## 2017-10-27 DIAGNOSIS — I82401 Acute embolism and thrombosis of unspecified deep veins of right lower extremity: Secondary | ICD-10-CM | POA: Insufficient documentation

## 2017-10-27 DIAGNOSIS — R599 Enlarged lymph nodes, unspecified: Secondary | ICD-10-CM | POA: Insufficient documentation

## 2017-10-27 DIAGNOSIS — J0110 Acute frontal sinusitis, unspecified: Secondary | ICD-10-CM | POA: Insufficient documentation

## 2017-10-27 DIAGNOSIS — I2699 Other pulmonary embolism without acute cor pulmonale: Secondary | ICD-10-CM | POA: Insufficient documentation

## 2017-10-27 DIAGNOSIS — I824Z1 Acute embolism and thrombosis of unspecified deep veins of right distal lower extremity: Secondary | ICD-10-CM

## 2017-10-27 DIAGNOSIS — Z7901 Long term (current) use of anticoagulants: Secondary | ICD-10-CM | POA: Insufficient documentation

## 2017-10-27 DIAGNOSIS — Z885 Allergy status to narcotic agent status: Secondary | ICD-10-CM | POA: Insufficient documentation

## 2017-10-27 DIAGNOSIS — M109 Gout, unspecified: Secondary | ICD-10-CM | POA: Insufficient documentation

## 2017-10-27 LAB — GLUCOSE, POCT (MANUAL RESULT ENTRY): POC GLUCOSE: 121 mg/dL — AB (ref 70–99)

## 2017-10-27 MED ORDER — CETIRIZINE HCL 10 MG PO TABS: 10.0000 mg | ORAL_TABLET | Freq: Every day | ORAL | 1 refills | Status: DC

## 2017-10-27 NOTE — Progress Notes (Signed)
Subjective:  Patient ID: Terry Poole, male    DOB: 04-30-1965  Age: 52 y.o. MRN: [id number]  CC: Diabetes   HPI Terry Poole is a 52 year old male with a history of type 2 diabetes mellitus (A1c 6.5, diet controlled) Hyperlipidemia, Gout, agonist with bilateral PE and right lower extremity DVT on 10/03/2017 and currently on anticoagulation with Xarelto. 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 He presents today requesting completion of paperwork for disability.  He complains of sinus congestion, cough productive of white sputum but no facial pain, fever, otalgia and has not used any OTC medications. He denies dyspnea, chest pain.  He did have an abnormal CT abdomen and pelvis in 10/2017 revealing findings below: IMPRESSION: 1. Peripancreatic edema and mild pancreatic tail enlargement, nonspecific but more commonly associated with pancreatitis is identified. No discrete mass is apparent. There are 1 cm short axis peripancreatic lymph nodes in the left upper quadrant possibly reactive. Short-term interval follow-up to assure resolution is recommended. Dedicated pancreatic CT or MRI with IV contrast may also help identify occult lesions. 2. Colonic diverticulosis without acute diverticulitis. 3. Thoracolumbar spondylosis.   , Past Medical History:  Diagnosis Date  . Bronchitis   . Bronchitis   . DVT (deep venous thrombosis) ([place])   . Gout   . Hammertoe of second toe of right foot   . Smoker     Past Surgical History:  Procedure Laterality Date  . HAMMER TOE SURGERY Right 03/10/2016   Procedure: 2ND RIGHT HAMMER TOE CORRECTION;  Surgeon: Landis Martins, DPM;  Location: [facility];  Service: Podiatry;  Laterality: Right;  . MASS EXCISION Right 03/10/2016   Procedure: EXCISION OF CALLUS 2ND RIGHT TOE;  Surgeon:  Landis Martins, DPM;  Location: [facility];  Service: Podiatry;  Laterality: Right;  . NO PAST SURGERIES      Allergies  Allergen Reactions  . Vicodin [Hydrocodone-Acetaminophen] Hives    Patient stated he is no longer allergic to vicodin, patient stated that he was allergic about 12 years ago.      Outpatient Medications Prior to Visit  Medication Sig Dispense Refill  . pantoprazole (PROTONIX) 40 MG tablet Take 1 tablet (40 mg total) by mouth 2 (two) times daily. 60 tablet 3  . rivaroxaban (XARELTO) 20 MG TABS tablet Take 1 tablet (20 mg total) by mouth daily with supper. 30 tablet 2  . Rivaroxaban (XARELTO) 15 MG TABS tablet Take 1 tablet (15 mg total) by mouth 2 (two) times daily with a meal for 21 days. (Patient not taking: Reported on 10/27/2017) 42 tablet 0   No facility-administered medications prior to visit.     ROS Review of Systems  Constitutional: Negative for activity change and appetite change.  HENT: Positive for postnasal drip. Negative for facial swelling, sinus pressure and sore throat.   Eyes: Negative for visual disturbance.  Respiratory: Positive for cough. Negative for chest tightness and shortness of breath.   Cardiovascular: Negative for chest pain and leg swelling.  Gastrointestinal: Negative for abdominal distention, abdominal pain, constipation and diarrhea.  Endocrine: Negative.   Genitourinary: Negative for dysuria.  Musculoskeletal: Negative for joint swelling and myalgias.  Skin: Negative for rash.  Allergic/Immunologic: Negative.   Neurological: Negative for weakness, light-headedness and numbness.  Psychiatric/Behavioral: Negative for dysphoric mood and suicidal ideas.    Objective:  BP 136/79   Pulse  80   Temp 97.7 F (36.5 C) (Oral)   Ht 5\' 9"  (1.753 m)   Wt 221 lb 6.4 oz (100.4 kg)   SpO2 99%   BMI 32.70 kg/m   BP/Weight 10/27/2017 10/19/2017 2/42/3536  Systolic BP 144 315 400  Diastolic BP 79 86 99  Wt. (Lbs) 221.4  221 223.6  BMI 32.7 32.64 33.02      Physical Exam  Constitutional: He is oriented to person, place, and time. He appears well-developed and well-nourished.  HENT:  Right Ear: External ear normal.  Mouth/Throat: Oropharynx is clear and moist.  Cardiovascular: Normal rate, normal heart sounds and intact distal pulses.  No murmur heard. Pulmonary/Chest: Effort normal and breath sounds normal. He has no wheezes. He has no rales. He exhibits no tenderness.  Abdominal: Soft. Bowel sounds are normal. He exhibits no distension and no mass. There is no tenderness.  Musculoskeletal: Normal range of motion.  1+ nonpitting right pedal edema  Neurological: He is alert and oriented to person, place, and time.      Lab Results  Component Value Date   HGBA1C 6.5 (A) 09/24/2017   HGBA1C 6.5 09/24/2017   HGBA1C 6.5 (A) 09/24/2017   HGBA1C 6.5 09/24/2017    Assessment & Plan:   1. Type 2 diabetes mellitus without complication, without long-term current use of insulin (HCC) Diet controlled with A1c of 6.5 - POCT glucose (manual entry)  2. Acute non-recurrent frontal sinusitis No indication for antibiotic at this time Placed on Zyrtec Advised to call the clinic in the event the symptoms worsen  3. Enlarged lymph nodes Follow-up on previously abnormal CT of the abdomen and pelvis - CT Abdomen Pelvis W Contrast; Future  4. Acute pulmonary embolism without acute cor pulmonale, unspecified pulmonary embolism type (HCC) Currently on anticoagulation with Xarelto Slightly abnormal hypercoagulable panel-we will defer to hematology regarding duration of anticoagulation - Ambulatory referral to Hematology / Oncology  5. Acute deep vein thrombosis (DVT) of distal vein of right lower extremity (HCC) Continue Xarelto   Meds ordered this encounter  Medications  . cetirizine (ZYRTEC) 10 MG tablet    Sig: Take 1 tablet (10 mg total) by mouth daily.    Dispense:  30 tablet    Refill:  1     Follow-up: Return in about 2 months (around 12/27/2017) for Follow-up of DVT and PE.   [name] MD

## 2017-11-02 MED FILL — XARELTO 20 MG TABLET: 20 | 30 days supply | Qty: 30 | Fill #1

## 2017-11-02 MED FILL — $Viagra 50mg tablet: 50 | 90 days supply | Qty: 30 | Fill #3

## 2017-11-03 ENCOUNTER — Ambulatory Visit: Payer: Self-pay | Attending: Family Medicine

## 2017-11-03 ENCOUNTER — Encounter: Payer: Self-pay | Admitting: Hematology and Oncology

## 2017-11-03 ENCOUNTER — Telehealth: Payer: Self-pay | Admitting: Hematology and Oncology

## 2017-11-03 NOTE — Telephone Encounter (Signed)
New referral received from Dr.Newlin for eval of pe. Pt has been scheduled for the pt to see Dr. Audelia Hives on 10/7 at 1pm. Pt aware to arrive 30 minutes early.  Letter mailed.

## 2017-11-08 ENCOUNTER — Other Ambulatory Visit: Payer: Self-pay | Admitting: Hematology and Oncology

## 2017-11-09 ENCOUNTER — Inpatient Hospital Stay: Payer: Self-pay

## 2017-11-09 ENCOUNTER — Other Ambulatory Visit: Payer: Self-pay

## 2017-11-09 ENCOUNTER — Encounter: Payer: Self-pay | Admitting: Hematology and Oncology

## 2017-11-09 ENCOUNTER — Telehealth: Payer: Self-pay | Admitting: Hematology and Oncology

## 2017-11-09 ENCOUNTER — Inpatient Hospital Stay: Payer: Self-pay | Attending: Hematology and Oncology | Admitting: Hematology and Oncology

## 2017-11-09 VITALS — BP 147/102 | HR 74 | Temp 98.0°F | Resp 20 | Ht 69.0 in | Wt 225.4 lb

## 2017-11-09 DIAGNOSIS — M1A0720 Idiopathic chronic gout, left ankle and foot, without tophus (tophi): Secondary | ICD-10-CM

## 2017-11-09 DIAGNOSIS — D649 Anemia, unspecified: Secondary | ICD-10-CM

## 2017-11-09 DIAGNOSIS — I2699 Other pulmonary embolism without acute cor pulmonale: Secondary | ICD-10-CM

## 2017-11-09 DIAGNOSIS — K5730 Diverticulosis of large intestine without perforation or abscess without bleeding: Secondary | ICD-10-CM | POA: Insufficient documentation

## 2017-11-09 DIAGNOSIS — E119 Type 2 diabetes mellitus without complications: Secondary | ICD-10-CM | POA: Insufficient documentation

## 2017-11-09 DIAGNOSIS — D6862 Lupus anticoagulant syndrome: Secondary | ICD-10-CM | POA: Insufficient documentation

## 2017-11-09 DIAGNOSIS — Z7901 Long term (current) use of anticoagulants: Secondary | ICD-10-CM | POA: Insufficient documentation

## 2017-11-09 DIAGNOSIS — R599 Enlarged lymph nodes, unspecified: Secondary | ICD-10-CM | POA: Insufficient documentation

## 2017-11-09 DIAGNOSIS — M5135 Other intervertebral disc degeneration, thoracolumbar region: Secondary | ICD-10-CM | POA: Insufficient documentation

## 2017-11-09 DIAGNOSIS — K429 Umbilical hernia without obstruction or gangrene: Secondary | ICD-10-CM | POA: Insufficient documentation

## 2017-11-09 DIAGNOSIS — K219 Gastro-esophageal reflux disease without esophagitis: Secondary | ICD-10-CM | POA: Insufficient documentation

## 2017-11-09 DIAGNOSIS — M479 Spondylosis, unspecified: Secondary | ICD-10-CM | POA: Insufficient documentation

## 2017-11-09 DIAGNOSIS — Z86711 Personal history of pulmonary embolism: Secondary | ICD-10-CM | POA: Insufficient documentation

## 2017-11-09 DIAGNOSIS — F17210 Nicotine dependence, cigarettes, uncomplicated: Secondary | ICD-10-CM | POA: Insufficient documentation

## 2017-11-09 LAB — CBC WITH DIFFERENTIAL (CANCER CENTER ONLY)
BASOS ABS: 0 10*3/uL (ref 0.0–0.1)
Basophils Relative: 0 %
Eosinophils Absolute: 0.2 10*3/uL (ref 0.0–0.5)
Eosinophils Relative: 4 %
HEMATOCRIT: 39.5 % (ref 38.4–49.9)
HEMOGLOBIN: 12.8 g/dL — AB (ref 13.0–17.1)
LYMPHS PCT: 64 %
Lymphs Abs: 4.4 10*3/uL — ABNORMAL HIGH (ref 0.9–3.3)
MCH: 28.1 pg (ref 27.2–33.4)
MCHC: 32.4 g/dL (ref 32.0–36.0)
MCV: 86.6 fL (ref 79.3–98.0)
MONO ABS: 0.3 10*3/uL (ref 0.1–0.9)
Monocytes Relative: 4 %
NEUTROS ABS: 1.9 10*3/uL (ref 1.5–6.5)
NEUTROS PCT: 28 %
Platelet Count: 256 10*3/uL (ref 140–400)
RBC: 4.56 MIL/uL (ref 4.20–5.82)
RDW: 15.7 % — AB (ref 11.0–14.6)
WBC Count: 6.8 10*3/uL (ref 4.0–10.3)

## 2017-11-09 LAB — COMPREHENSIVE METABOLIC PANEL
ALBUMIN: 4.1 g/dL (ref 3.5–5.0)
ALT: 70 U/L — ABNORMAL HIGH (ref 0–44)
ANION GAP: 8 (ref 5–15)
AST: 29 U/L (ref 15–41)
Alkaline Phosphatase: 136 U/L — ABNORMAL HIGH (ref 38–126)
BILIRUBIN TOTAL: 0.7 mg/dL (ref 0.3–1.2)
BUN: 10 mg/dL (ref 6–20)
CO2: 29 mmol/L (ref 22–32)
Calcium: 9.6 mg/dL (ref 8.9–10.3)
Chloride: 105 mmol/L (ref 98–111)
Creatinine, Ser: 1.17 mg/dL (ref 0.61–1.24)
GFR calc Af Amer: >60 mL/min (ref >60)
GFR calc non Af Amer: >60 mL/min (ref >60)
GLUCOSE: 93 mg/dL (ref 70–99)
POTASSIUM: 4.2 mmol/L (ref 3.5–5.1)
SODIUM: 142 mmol/L (ref 135–145)
TOTAL PROTEIN: 8.5 g/dL — AB (ref 6.5–8.1)

## 2017-11-09 LAB — RETICULOCYTES
RBC.: 4.56 MIL/uL (ref 4.20–5.82)
RETIC COUNT ABSOLUTE: 59.3 10*3/uL (ref 34.8–93.9)
RETIC CT PCT: 1.3 % (ref 0.8–1.8)

## 2017-11-09 LAB — URIC ACID: Uric Acid, Serum: 9.5 mg/dL — ABNORMAL HIGH (ref 3.7–8.6)

## 2017-11-09 NOTE — Telephone Encounter (Signed)
Scheduled appt per 10/7 los - gave patient AVS and calender per los.   

## 2017-11-09 NOTE — Patient Instructions (Signed)
November 09, 2017 We discussed in detail the recent hospitalization at [facility] for pulmonary embolism involving both lungs as well as a deep vein thrombosis in the right lower extremity.  Since starting treatment, both the chest pain and right lower extremity pain have improved.  It will take time before it resolves.  Because you are on blood thinners, be very conscious of excessive bleeding.  You should avoid unnecessary procedures.  If any dental work is to be done, you should tell the dentist beforehand that you are on blood thinners. In addition you should avoid high risk behavior that may precipitate falling or unnecessary trauma especially to your head.  It is recommended that she keep your feet elevated when in the house, and avoid any unnecessary prolonged periods of sitting or standing.  In addition it is recommended that you avoid long trips without moving around, such as, long automobile rides, greater than 2 hours.  It is recommended that you continue on rivaroxaban (Xarelto): 20 mg once daily.  In addition, it was found that you are not anemic (low RBC).  Laboratory studies were requested to help identify why you are anemic.  Those results are pending.  A follow-up CAT scan is now scheduled later in October as requested by Dr. Margarita Rana.  Barring any unforeseen complications, your next scheduled doctor visit is on November 4 to discuss those results and recommendations.  Please do not hesitate to call should any questions arise in the interim.  Thank you! Ladona Ridgel, MD Hematology/Oncology [phone]

## 2017-11-09 NOTE — Progress Notes (Signed)
[facility] Initial Consultation  Patient Name:  Terry Poole  DOB: August 10, 1965   Date of Service: November 09, 2017  Referring Provider: [name], MD [place], [place] [address] [address] [place],  [address]  Consulting Physician: Henreitta Leber, MD Hematology/Oncology   Reason for Referral: In the setting of an acute pulmonary embolism, and right lower extremity deep vein thrombosis identified at the time of CT angiography on October 02, 2017 at [facility]; having undergone a hypercoagulable evaluation; found also to be anemic over an indeterminate period; currently on rivaroxaban without complication, he presents now for further diagnostic and therapeutic recommendations.  History Present Illness: Terry Poole is a 52 year old resident of [place] whose past medical history is significant for non-insulin-requiring diabetes mellitus, on dietary "control;" dyslipidemia; gastroesophageal reflux disease; degenerative joint disease involving the right knee and shoulders; erectile dysfunction; chronic sinusitis; active tobacco dependence; previous alcohol dependence, now discontinued;and gouty arthritis associated with pain in the left toe, now resolved.  His primary care provider is Dr. [name].  He is alone at this first visit.  On October 02, 2017 he presented to the emergency department at [facility] emergency department complaining of increased swelling and pain in the right leg, worsening now over the past several months along with substernal chest pain which he reports began several days prior to his presentation.  On admission CT angiography of the chest was performed the results of which revealed bilateral pulmonary emboli involving the lower lobe and segmental and subsegmental branches.  A 2D echocardiogram was performed on September 2, the results of which revealed  no evidence of right strain or cor pulmonale.  Those results are detailed below.    He received unfractionated heparin both by bolus and continuous infusion by protocol beginning 11:00 AM on August 30.  A careful review of both the medication administration and laboratory studies suggest that a battery of laboratory studies to evaluate a hereditary/acquired thrombophilia was obtained on August 30 at 11:21 AM.  Thus it appears that he received unfractionated heparin before his laboratory evaluation.  This likely contaminated some of the vitamin K dependent cofactors such as protein S activity.  The laboratory evaluation on August 30 revealed PT/INR 15.3/1.22 PTT 28 Antithrombin III 91 protein C activity 94 protein S activity 40 (63-140%) lupus anticoagulant was affected by the presence of heparin (after heparin neutralization) it normalized; beta-2 glycoprotein 1 IgG <9; beta-2 glycoprotein 1 IgM <9; beta-2 glycoprotein 1 IgA <9; homocystine 13.7; factor V Leiden gene mutation by PCR: Negative; prothrombin gene mutation [id number]: Not identified; anticardiolipin antibodies IgG <9; anticardiolipin antibody IgA <9; anticardiolipin antibody IgM 13 (indeterminate 12-20).  He reports no coronary artery disease, angina, or myocardial infarction.  He has no cardiac dysrhythmia or primary hypertension.  He denies seizure disorder or stroke syndrome.  There is no thyroid disease.  He has gastroesophageal reflux disease controlled on medication.  He has no peptic ulcer disease.  He denies viral hepatitis, inflammatory bowel disease, or diverticulosis.  He has never had a screening colonoscopy.  He has a screening colonoscopy tentatively scheduled in March.  There is no rheumatoid arthritis or systemic lupus erythematosus.  This was his first venous thromboembolic event in August.  He has been compliant with rivaroxaban.  Following his hospital discharge and start of treatment with rivaroxaban, his chest pain has improved  significantly.  He continues to have pain in the right lower  extremity above the knee which persist along with swelling and continued pain, still improving.  He states also that he has degenerative joint disease involving the right knee which contributes to his pain.  Both his appetite and weight remain stable.  He reports no rash or itching.  He reports no unusual headache, dizziness, lightheadedness, syncope, or near syncopal episodes.  He denies any new visual changes or hearing deficit.  He has no unusual cough, sore throat, or orthopnea.  He has no pain or difficulty in swallowing.  He denies dyspnea either at rest or on minimal exertion.    No fever, shaking chills, sweats, or flulike symptoms are reported.  With medication, he has no heartburn or indigestion.  There has infrequent nausea without vomiting, now improved with pantoprazole.  He denies diarrhea or constipation.  He moves his bowels twice daily.  He denies melena or bright red blood per rectum.  No urinary frequency, urgency, hematuria, or dysuria are evident.  He continues to have swelling of the right ankle extending to the mid calf along with tenderness above the knee, steadily improving.    He took medication at one time for gout, but thinks he has no gouty arthritis symptoms.  He has no prior blood disorder or bleeding tendency.  There is no history of blood disorder, bleeding tendency, or easy bruisability within his family.  For a lengthy period of time he has had "numbness" of the fifth digit of his right foot on a constant basis.  He denies any numbness or tingling in his fingers.  It is with this background he presents now for further diagnostic and therapeutic recommendations in the setting of an acute onset bilateral pulmonary embolism and right lower extremity deep vein thrombosis without evidence of right heart strain or cor pulmonale, currently on rivaroxaban: 20 mg once daily, as outlined above.  Past Medical History:   Diagnosis Date  . Bronchitis   . Bronchitis   . DVT (deep venous thrombosis) ([place])   . Gout   . Hammertoe of second toe of right foot   . Smoker    Past Surgical History:  Procedure Laterality Date  . HAMMER TOE SURGERY Right 03/10/2016   Procedure: 2ND RIGHT HAMMER TOE CORRECTION;  Surgeon: Landis Martins, DPM;  Location: [facility];  Service: Podiatry;  Laterality: Right;  . MASS EXCISION Right 03/10/2016   Procedure: EXCISION OF CALLUS 2ND RIGHT TOE;  Surgeon: Landis Martins, DPM;  Location: [facility];  Service: Podiatry;  Laterality: Right;  . NO PAST SURGERIES     Family History  Problem Relation Age of Onset  . Heart disease Neg Hx   . Kidney disease Neg Hx   Mother: Deceased: Age 22 years: DM/HTN. Father: Deceased: Age 11 years: EtOH. Brothers (2): One brother has a seizure disorder related to EtOH abuse.                       His second brother died at the age of 1 years of a gunshot wound. Sisters (6): No bleeding tendency or blood disorder.  Social History   Socioeconomic History  . Marital status: Single    Spouse name: Not on file  . Number of children: Not on file  . Years of education: Not on file  . Highest education level: Not on file  Occupational History  . Not on file  Social Needs  . Financial resource strain: Not on file  . Food  insecurity:    Worry: Not on file    Inability: Not on file  . Transportation needs:    Medical: Not on file    Non-medical: Not on file  Tobacco Use  . Smoking status: Current Every Day Smoker    Packs/day: 0.25    Types: Cigarettes  . Smokeless tobacco: Never Used  . Tobacco comment: 8 cigs daily  Substance and Sexual Activity  . Alcohol use: No  . Drug use: No  . Sexual activity: Not on file  Lifestyle  . Physical activity:    Days per week: Not on file    Minutes per session: Not on file  . Stress: Not on file  Relationships  . Social connections:    Talks on phone: Not on file     Gets together: Not on file    Attends religious service: Not on file    Active member of club or organization: Not on file    Attends meetings of clubs or organizations: Not on file    Relationship status: Not on file  . Intimate partner violence:    Fear of current or ex partner: Not on file    Emotionally abused: Not on file    Physically abused: Not on file    Forced sexual activity: Not on file  Other Topics Concern  . Not on file  Social History Narrative  . Not on file  Terry Poole is single, never married. He works in Theatre manager. He has 2 children ages 53 and 65 years without major medical problems. He began smoking the age of 66 years. He smoked a maximum 1/2 pack of cigarettes daily. Currently smokes <1/2 pack of cigarettes daily. From age 66 to 17 years, he drank heavily. Until the age of 50 years, he used cocaine and marijuana, now none.  Transfusion History: No prior transfusion  Exposure History: Has no known exposure to toxic chemicals, radiation, or pesticides.  Allergies  Allergen Reactions  . Vicodin [Hydrocodone-Acetaminophen] Hives    Patient stated he is no longer allergic to vicodin, patient stated that he was allergic about 12 years ago.   Bactrim causes rash No food or seasonal allergies  Current Outpatient Medications on File Prior to Visit  Medication Sig  . cetirizine (ZYRTEC) 10 MG tablet Take 1 tablet (10 mg total) by mouth daily.  . pantoprazole (PROTONIX) 40 MG tablet Take 1 tablet (40 mg total) by mouth 2 (two) times daily.  . rivaroxaban (XARELTO) 20 MG TABS tablet Take 1 tablet (20 mg total) by mouth daily with supper.  . Sildenafil Citrate (VIAGRA PO) 50 mg.  . XARELTO 20 MG TABS tablet TAKE 1 TABLET (20 MG TOTAL) BY MOUTH DAILY WITH SUPPER.   No current facility-administered medications on file prior to visit.     Review of Systems: Constitutional: No fever, sweats, or shaking chills.  No appetite or weight deficit. Skin: No  rash, scaling, sores, lumps, or jaundice. HEENT: No visual changes or hearing deficit; chronic sinusitis, now quiescent. Pulmonary: No unusual cough, sore throat, or orthopnea; active smoker.  No DOE. Cardiovascular: No coronary artery disease, angina, or myocardial infarction.  No cardiac dysrhythmia; essential hypertension and dyslipidemia. Gastrointestinal: No indigestion, dysphagia, abdominal pain, diarrhea, or constipation.  No change in bowel habits.  No melena or bright red blood per rectum; GERD. Genitourinary: No urinary frequency, urgency, hematuria, or dysuria; erectile dysfunction. Musculoskeletal: Right knee pain.  No new arthralgias or myalgias; no joint swelling, pain, or instability; gout.  Hematologic: No bleeding tendency or easy bruisability; anemia. Endocrine: No intolerance to heat or cold; no thyroid disease or diabetes mellitus. Vascular: No peripheral arterial; DVT/PE (October 02, 2017). Psychological: No anxiety, depression, or mood changes; no mental health illnesses. Neurological: No dizziness, lightheadedness, syncope, or near syncopal episodes; no numbness or tingling in the fingers.  He has constant nonpainful numbing of the right fifth digit.  Physical Examination: Vital Signs: Body surface area is 2.23 meters squared.  Vitals:   11/09/17 1317 11/09/17 1318  BP: (!) 160/98 (!) 147/102  Pulse: 74   Resp: 20   Temp: 98 F (36.7 C)   SpO2: 96%     Filed Weights   11/09/17 1317  Weight: 225 lb 6.4 oz (102.2 kg)  ECOG PERFORMANCE STATUS: 1 Constitutional:  Terry Poole is a fully nourished and developed African-American albeit overweight.  He looks age appropriate.  He is friendly and cooperative without respiratory compromise at rest. Skin: No rashes, scaling, dryness, jaundice, or itching. HEENT: Head is normocephalic and atraumatic.  Pupils are equal round and reactive to light and accommodation.  Sclerae are anicteric.  Bilateral arcus senilis.  Conjunctivae  are pink.  No sinus tenderness nor oropharyngeal lesions.  Lips without cracking or peeling; tongue without mass, inflammation, or nodularity.  Mucous membranes are moist. Neck: Supple and symmetric.  No jugular venous distention or thyromegaly.  Trachea is midline. Lymphatics: No cervical or supraclavicular lymphadenopathy.  No epitrochlear, axillary, or inguinal lymphadenopathy is appreciated. Respiratory/chest: Thorax is symmetrical.  Breath sounds are clear to auscultation and percussion.  Normal excursion and respiratory effort. Back: Symmetric without deformity or tenderness. Cardiovascular: Heart rate and rhythm are regular without murmurs or gallops. Gastrointestinal: Abdomen is protuberant, soft, nontender; no organomegaly.  Bowel sounds are normoactive.  No masses are appreciated. Genitourinary: Normal external male genitalia. Rectal examination: Not performed. Extremities: In the lower extremities, there is no asymmetric swelling, erythema, tenderness, or cord formation.  No clubbing or cyanosis.  The right lower extremity is edematous from the ankle extending to the mid calf with minimal pitting. Hematologic: No petechiae, hematomas, or ecchymoses. Psychological:  He is oriented to person, place, and time; normal affect. Neurological: There are no gross neurologic deficits.  Laboratory Results: I have reviewed the data as listed: CBC Latest Ref Rng & Units 11/09/2017 10/06/2017 10/05/2017  WBC 4.0 - 10.3 K/uL 6.8 8.3 8.2  Hemoglobin 13.0 - 17.1 g/dL 12.8(L) 11.0(L) 10.9(L)  Hematocrit 38.4 - 49.9 % 39.5 33.9(L) 34.1(L)  Platelets 140 - 400 K/uL 256 264 237    CMP Latest Ref Rng & Units 10/06/2017 10/04/2017 10/03/2017  Glucose 70 - 99 mg/dL 170(H) 147(H) 225(H)  BUN 6 - 20 mg/dL 6 6 7   Creatinine 0.61 - 1.24 mg/dL 1.09 0.94 1.07  Sodium 135 - 145 mmol/L 136 137 137  Potassium 3.5 - 5.1 mmol/L 4.3 3.4(L) 3.4(L)  Chloride 98 - 111 mmol/L 101 104 104  CO2 22 - 32 mmol/L 24 26 24    Calcium 8.9 - 10.3 mg/dL 9.0 8.1(L) 7.9(L)  Total Protein 6.5 - 8.1 g/dL - 7.0 -  Total Bilirubin 0.3 - 1.2 mg/dL - 0.8 -  Alkaline Phos 38 - 126 U/L - 103 -  AST 15 - 41 U/L - 14(L) -  ALT 0 - 44 U/L - 18 -  10/03/2017 12:43 AM - Interface, Lab In [facility]   Component Value Flag Ref Range Units Status  WBC 11.2  High   4.0 - 10.5 K/uL Final  RBC 3.78  Low   4.22 - 5.81 MIL/uL Final  Hemoglobin 10.4  Low   13.0 - 17.0 g/dL Final  HCT 32.5  Low   39.0 - 52.0 % Final  MCV 86.0   78.0 - 100.0 fL Final  MCH 27.5   26.0 - 34.0 pg Final  MCHC 32.0   30.0 - 36.0 g/dL Final  RDW 14.6   11.5 - 15.5 % Final  Platelets 183   150 - 400 K/uL Final  Comment:  Performed at [facility], [place] [address] [place], [place] [address]   Diagnostic/Imaging Studies: October 04, 2017 Transthoracic Echocardiography Study Conclusions: - Left ventricle: The cavity size was normal. Systolic function was   normal. The estimated ejection fraction was in the range of 55%   to 60%. Wall motion was normal; there were no regional wall   motion abnormalities. - Right ventricle: The cavity size was normal. Wall thickness was   normal. Systolic function was normal. - Pulmonary arteries: Systolic pressure was mildly increased. PA   peak pressure: 34 mmHg  Left ventricle:  The cavity size was normal. Systolic function was normal. The estimated ejection fraction was in the range of 55% to 60%. Wall motion was normal; there were no regional wall motion abnormalities. The transmitral flow pattern was normal. The deceleration time of the early transmitral flow velocity was normal. The pulmonary vein flow pattern was normal.  Aortic valve:   Trileaflet; normal thickness leaflets. Mobility was not restricted.  Doppler:  Transvalvular velocity was within the normal range. There was no stenosis. There was no regurgitation. Peak velocity ratio of LVOT to aortic valve: 0.61. Valve area (Vmax): 2.1 cm^2. Indexed  valve area (Vmax): 0.95 cm^2/m^2.    Peak gradient (S): 10 mm Hg.  Aorta:  Aortic root: The aortic root was normal in size.  Mitral valve:   Structurally normal valve.   Mobility was not restricted.  Doppler:  Transvalvular velocity was within the normal range. There was no evidence for stenosis. There was trivial regurgitation.    Valve area by pressure half-time: 2.86 cm^2. Indexed valve area by pressure half-time: 1.29 cm^2/m^2.    Peak gradient (D): 3 mm Hg.  Left atrium:  The atrium was at the upper limits of normal in size.   Right ventricle:  The cavity size was normal. Wall thickness was normal. Systolic function was normal.  Pulmonic valve:   Poorly visualized.  The valve appears to be grossly normal.    Doppler:  Transvalvular velocity was within the normal range. There was no evidence for stenosis.  Tricuspid valve:   Structurally normal valve.    Doppler: Transvalvular velocity was within the normal range. There was no regurgitation.  Pulmonary artery:   The main pulmonary artery was normal-sized. Systolic pressure was mildly increased.  Right atrium:  The atrium was normal in size.  Pericardium:  There was no pericardial effusion.  Systemic veins: Inferior vena cava: The vessel was normal in size.  ATTENDING    Ray, Nigel Sloop ADMITTING    Rai, Ripudeep K ORDERING     Rai, Ripudeep K REFERRING    Rai, Ripudeep K PERFORMING   [facility], Inpatient SONOGRAPHER  Cardell Peach, RDCS  October 04, 2017 CT ABDOMEN AND PELVIS WITH CONTRAST  CONTRAST:  131mL ISOVUE-300 IOPAMIDOL (ISOVUE-300) INJECTION 61%  COMPARISON:  Chest CT 10/02/2017  FINDINGS: Lower chest: Normal heart size without pericardial effusion. Mild thickening of the distal esophagus cannot exclude mild esophagitis.  No small segmental or subsegmental pulmonary emboli noted on images through the lung bases though the study is not tailored toward assessment of the pulmonary arteries on  this exam.  Hepatobiliary: Homogeneous attenuation of the liver. Hyperdensity along the dependent aspect of the nondistended gallbladder may represent an adjacent vessel and less likely gallstones.  Pancreas: There is peripancreatic edema more so along the body and tail of the pancreas raising suspicion pancreatitis. A discrete mass is not identified however there is a slightly bulbous appearance to the tail of the pancreas that may simply reflect inflammatory change. No necrosis is identified. No ductal dilatation is seen. Short-term interval follow-up to assure resolution is recommended. Alternatively should findings persist, consider pancreatic protocol MRI or CT with IV contrast.  Spleen: No splenomegaly or mass.  Adrenals/Urinary Tract: Normal bilateral adrenal glands. Water attenuating cyst in the lower pole the right kidney measuring 16 mm in diameter.  Stomach/Bowel: Minimal colonic diverticulosis without acute diverticulitis. The stomach is decompressed in appearance. The duodenal bulb and sweep as well as ligament of Treitz are unremarkable. The appendix is normal as is the distal terminal ileum. No small bowel dilatation or inflammation. Moderate stool retention within the colon without inflammation.  Vascular/Lymphatic: Nonaneurysmal abdominal aorta. A few small peripancreatic mildly enlarged lymph nodes are identified measuring up to 1 cm short axis in the left upper quadrant, series 3/19 and 20 that may be reactive in etiology. Small subcentimeter mesenteric lymph nodes without pathologic enlargement. No pelvic sidewall adenopathy. No inguinal adenopathy.  Reproductive: Normal prostate and seminal vesicles.  Other: Small amount of free fluid in the pelvis. Fat containing umbilical hernia.  Musculoskeletal: No aggressive osseous lesions. Thoracolumbar spondylosis with multilevel degenerative disc disease and endplate spurring greatest at L4-5.  Multilevel facet arthrosis of the lumbar spine.  IMPRESSION: 1. Peripancreatic edema and mild pancreatic tail enlargement, nonspecific but more commonly associated with pancreatitis is identified. No discrete mass is apparent. There are 1 cm short axis peripancreatic lymph nodes in the left upper quadrant possibly reactive. Short-term interval follow-up to assure resolution is recommended. Dedicated pancreatic CT or MRI with IV contrast may also help identify occult lesions. 2. Colonic diverticulosis without acute diverticulitis. 3. Thoracolumbar spondylosis.  Ashley Royalty M.D. 10/05/2017 00:21   October 02, 2017 CT ANGIOGRAPHY CHEST WITH CONTRAST  CONTRAST:  154mL ISOVUE-370 IOPAMIDOL (ISOVUE-370) INJECTION 76%  COMPARISON:  CT 06/21/2017  FINDINGS: Cardiovascular:  Heart:  No cardiomegaly. No pericardial fluid/thickening. No significant coronary calcifications. There is no measured inversion of the RV/LV ratio, with no septal inversion. No evidence of right-sided heart strain by CT imaging.  Aorta:  Unremarkable course, caliber, contour of the thoracic aorta. No aneurysm or dissection flap. No periaortic fluid.  Pulmonary arteries:  Central filling defects of the bilateral lower lobes involving segmental and subsegmental vessels. No filling defects of the lobar or the main pulmonary artery.  Mediastinum/Nodes: No mediastinal adenopathy. Unremarkable appearance of the thoracic esophagus.  Unremarkable appearance of the thoracic inlet and thyroid.  Lungs/Pleura: Central airways are clear. No pleural effusion. No confluent airspace disease.  No pneumothorax.  Upper Abdomen: Vague inflammatory changes/edema of the left upper quadrant at the hilum of the spleen and involving distal pancreas. Multiple lymph nodes are present at this location.  Musculoskeletal: Degenerative changes of the spine. No acute displaced fracture.  Review of the MIP  images confirms the above findings.  IMPRESSION: CT positive for bilateral pulmonary emboli involving lower lobe segmental and subsegmental branches.  Inflammatory changes of  the left upper quadrant at the hilum of the spleen and involving distal pancreas. This may reflect acute pancreatitis, however, given the presence of pulmonary emboli, malignancy is also considered. Further evaluation with contrast-enhanced abdominal CT recommended.  These results were called by telephone at the time of interpretation on 10/02/2017 at 10:34 am to Dr. Dalia Heading , who verbally acknowledged these results.  Corrie Mckusick D.O. 10/02/2017 10:35  Summary/Assessment: In the setting of an acute pulmonary embolism, and right lower extremity deep vein thrombosis identified at the time of CT angiography on October 02, 2017 at [facility]; having undergone a hypercoagulable evaluation while hospitalized; found also to be anemic over an indeterminate period; currently on rivaroxaban without complication, he presents now for further diagnostic and therapeutic recommendations.   On October 02, 2017 he presented to the emergency department at [facility] complaining of increased swelling and pain in the right leg, worsening over the past several months along with substernal chest pain which he reports began several days prior to his presentation.  On admission CT angiography of the chest was performed the results of which revealed bilateral pulmonary emboli involving the lower lobe and segmental and subsegmental branches.  A 2D echocardiogram was performed on September 2 the results of which revealed no evidence of right strain or cor pulmonale.  Those results are detailed below.    The results of the initial hypercoagulable evaluation revealed from October 02, 2017 show PT/INR 15.3/1.22 PTT 28 Antithrombin III 91 protein C activity 94 protein S activity 40 (63-140%) lupus  anticoagulant was affected by the presence of heparin (after heparin neutralization) it normalized; beta-2 glycoprotein 1 IgG <9; beta-2 glycoprotein 1 IgM <9; beta-2 glycoprotein 1 IgA <9; homocysteine 13.7; factor V Leiden gene mutation by PCR: Negative; prothrombin gene mutation [id number]: Not identified; anticardiolipin antibodies IgG <9; anticardiolipin antibody IgA <9; anticardiolipin antibody IgM 13 (indeterminate 12-20).  Starting heparin before the vitamin K dependent coagulation studies would clearly contaminate the results of both the lupus anticoagulant and protein S activity.  After identifying heparin within the lupus anticoagulant test specimen, it was first neutralized and secondarily identified as a normal lupus anticoagulant. With protein S activity, heparin effect cannot be reversed.    He reports no coronary artery disease, angina, or myocardial infarction.  He has no cardiac dysrhythmia or primary hypertension.  He denies seizure disorder or stroke syndrome.  There is no thyroid disease.  He has gastroesophageal reflux disease controlled on medication.  He has no peptic ulcer disease.  He denies viral hepatitis, inflammatory bowel disease, or diverticulosis.  He has never had a screening colonoscopy.  He has a screening colonoscopy tentatively scheduled in March.  There is no rheumatoid arthritis or systemic lupus erythematosus.  This was his first venous thromboembolic event in August.  He has been compliant with rivaroxaban.  Following his hospital discharge and start of treatment with rivaroxaban, his chest pain has improved significantly.  He continues to have pain in the right lower extremity above the knee which persists along with swelling and continued pain, still improving.  He states also that he has degenerative joint disease involving the right knee which contributes to his pain.    Both his appetite and weight remain stable.  He reports no rash or itching.  He reports no  unusual headache, dizziness, lightheadedness, syncope, or near syncopal episodes.  He denies any new visual changes or hearing deficit.  He has no unusual cough, sore throat,  or orthopnea.  He has no pain or difficulty in swallowing.  He denies dyspnea either at rest or on minimal exertion.  No fever, shaking chills, sweats, or flulike symptoms are reported.  With medication, he has no heartburn or indigestion.  There has infrequent nausea without vomiting, now improved with pantoprazole.  He denies diarrhea or constipation.  He moves his bowels twice daily.  He denies melena or bright red blood per rectum.    No urinary frequency, urgency, hematuria, or dysuria are evident.  He continues to have swelling of the right ankle extending to the mid-calf along with tenderness above the knee, steadily improving.  He took medication at one time for gout, but thinks he has no gouty arthritis.  He is not currently on any medication for same.  He has no prior blood disorder or bleeding tendency.  There is no history of blood disorder, bleeding tendency, or easy bruisability within his family.  For a lengthy period of time he has had "numbness" of the fifth digit of his right foot on a constant basis.  He denies any numbness or tingling in his fingers.    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   Recommendation/Plan: We discussed in detail his recent hospitalization at [facility] and his the diagnosis of bilateral pulmonary emboli and right lower extremity deep vein thrombosis.  Since starting treatment both his chest pain and right lower extremity pain  have improved.  It was emphasized that it will take time before the pain and swelling subside.  The very mild elevation in anticardiolipin antibody IgM (13) is not of clinical significance.  In the setting of an acute thrombotic event, frequently the anticardiolipin antibody profile is abnormal.  This very mild elevation is inconsequential.  It is recommended that it be repeated at a 12-week interval for completeness.  While hospitalized, he received unfractionated heparin both by bolus and continuous infusion by protocol beginning 11:00 AM on August 30.  A careful review of both his medication administration and laboratory studies suggest that a battery of laboratory studies to evaluate a hereditary/acquired thrombophilia was obtained on August 30 at 11:21 AM.  Thus it appears that he received unfractionated heparin bolus/infusion before his laboratory evaluation.   It was recommended that he continue rivaroxaban: 20 mg once daily for the present.  Because he still has some chest "discomfort," and significant swelling and pain in the right lower extremity, it is unclear how long he will remain on anticoagulation.  While on anticoagulation therapy, he should be very conscious of excessive bleeding.  He was advised to notify a healthcare provider in the event that he has prolonged periods of bleeding, such as epistaxis or bright red blood per rectum.  He should avoid unnecessary surgical procedures.  If dental work is to be done, he should discuss his medication and history with his dentist beforehand.  While on rivaroxaban, it was recommended that he avoid high risk behavior which might lead to unnecessary or incidental trauma especially to his head.  To date he is tolerating rivaroxaban without any difficulty or bleeding diathesis.  It was recommended also that he avoid prolonged periods of standing and sitting.  Should he go on a long automobile or airplane trips, he  was advised to move about as much  as possible every 2 hours or less.  While indoors, it was recommended that he keep his feet elevated.  He was reassured that the direct oral anticoagulant is working and that it is likely that he will continue to improve with regard to the pain syndrome.  He was advised to notify us or Dr. Margarita Rana If the leg swelling or pain worsens.  Because of his anemia of unclear etiology, it was recommended that a mini -anemia work-up be obtained today to exclude iron deficiency and/or hemolysis.  There is no clinical or laboratory suggestion that he has a nutritional deficiency.  Those results were not available at the time of discharge.  We will discuss those results with him at the time of his next visit.  Barring any unforeseen complications, his next scheduled doctor visit is on November 4.   He was advised to call us in the interim if any new or untoward problems arise in the interim especially those related to bleeding tendency.  The total time spent discussing his recent bilateral pulmonary embolism and right lower extremity deep vein thrombosis, laboratory studies obtained at [facility] in August, imaging studies, anemia of unclear etiology, methodology for evaluating both anemia and a hypercoagulable predisposition either hereditary or acquired, problems associated with the above described laboratory studies after starting heparin, preliminary considerations while on rivaroxaban, bleeding precautions, and prolonged periods of standing and sitting was 60 minutes.  At least 50% of that time was spent in discussion, reviewing outside records, laboratory evaluation, counseling, and answering questions. All questions were answered to his satisfaction. .  This note was dictated using voice activated technology/software.  Unfortunately, typographical errors are not uncommon, and transcription is subject to mistakes and regrettably misinterpretation.  If necessary, clarification of the above information can be  discussed with me at any time.  Thank you Dr. Margarita Rana, for allowing my participation in the care of Terry Poole. I will keep you closely informed as the results of his preliminary laboratory data become available.  Please do not hesitate to call should any questions arise regarding this initial consultation and discussion.  FOLLOW UP: AS DIRECTED   cc:    [name] MD  Henreitta Leber, MD  Hematology/Oncology [facility] [address] [place], [place] [address] Office: [phone] ALPF: [phone]

## 2017-11-10 ENCOUNTER — Encounter: Payer: Self-pay | Admitting: Family Medicine

## 2017-11-10 ENCOUNTER — Ambulatory Visit: Payer: Self-pay | Attending: Family Medicine | Admitting: Family Medicine

## 2017-11-10 VITALS — BP 142/98 | HR 78 | Temp 98.1°F | Ht 69.0 in | Wt 225.2 lb

## 2017-11-10 DIAGNOSIS — Z7984 Long term (current) use of oral hypoglycemic drugs: Secondary | ICD-10-CM | POA: Insufficient documentation

## 2017-11-10 DIAGNOSIS — G8929 Other chronic pain: Secondary | ICD-10-CM

## 2017-11-10 DIAGNOSIS — Z86718 Personal history of other venous thrombosis and embolism: Secondary | ICD-10-CM | POA: Insufficient documentation

## 2017-11-10 DIAGNOSIS — E119 Type 2 diabetes mellitus without complications: Secondary | ICD-10-CM

## 2017-11-10 DIAGNOSIS — Z7901 Long term (current) use of anticoagulants: Secondary | ICD-10-CM | POA: Insufficient documentation

## 2017-11-10 DIAGNOSIS — M25561 Pain in right knee: Secondary | ICD-10-CM | POA: Insufficient documentation

## 2017-11-10 DIAGNOSIS — I2699 Other pulmonary embolism without acute cor pulmonale: Secondary | ICD-10-CM

## 2017-11-10 LAB — HAPTOGLOBIN: Haptoglobin: 138 mg/dL (ref 34–200)

## 2017-11-10 LAB — IRON AND TIBC
IRON: 74 ug/dL (ref 42–163)
SATURATION RATIOS: 24 % — AB (ref 42–163)
TIBC: 311 ug/dL (ref 202–409)
UIBC: 237 ug/dL

## 2017-11-10 LAB — FERRITIN: Ferritin: 146 ng/mL (ref 24–336)

## 2017-11-10 LAB — GLUCOSE, POCT (MANUAL RESULT ENTRY): POC Glucose: 116 mg/dl — AB (ref 70–99)

## 2017-11-10 MED ORDER — METFORMIN HCL 500 MG PO TABS: 500.0000 mg | ORAL_TABLET | Freq: Every day | ORAL | 3 refills | Status: DC

## 2017-11-10 MED ORDER — TRAMADOL HCL 50 MG PO TABS: 50.0000 mg | ORAL_TABLET | Freq: Two times a day (BID) | ORAL | 0 refills | Status: DC | PRN

## 2017-11-10 MED FILL — traMADol HCL 50 MG TABS: 50 | 15 days supply | Qty: 30 | Fill #0

## 2017-11-10 NOTE — Progress Notes (Signed)
Subjective:  Patient ID: Terry Poole, male    DOB: Nov 25, 1965  Age: 52 y.o. MRN: [id number]  CC: Diabetes   HPI Terry Poole is a 52 year old male with a history of type 2 diabetes mellitus (A1c 6.5) Hyperlipidemia, Gout, agonist with bilateral PE and right lower extremity DVT on 10/03/2017 and currently on anticoagulation with Xarelto. 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 I had referred him to hematology regarding duration of anticoagulation therapy and his visit was yesterday and notes have been reviewed.  Unclear duration of anticoagulation at this time as per hematology due to right leg edema and some chest discomfort and he was scheduled for an upcoming appointment next month. His right leg edema has improved and he denies pain in his leg, shortness of breath and denies chest pain at this time; he does not work and he is concerned about returning to work next month given he is on his feet all day at his job at [employer].  He complains of right knee pain on the medial aspect which is worse when he bears weight on his right leg.  Denies history of trauma and pain is chronic and has been intermittent.  Denies swelling of his knee and does not currently take any analgesics.  Past Medical History:  Diagnosis Date  . Bronchitis   . Bronchitis   . DVT (deep venous thrombosis) ([place])   . Gout   . Hammertoe of second toe of right foot   . Smoker     Past Surgical History:  Procedure Laterality Date  . HAMMER TOE SURGERY Right 03/10/2016   Procedure: 2ND RIGHT HAMMER TOE CORRECTION;  Surgeon: Landis Martins, DPM;  Location: [place];  Service: Podiatry;  Laterality: Right;  . MASS EXCISION Right 03/10/2016   Procedure: EXCISION OF CALLUS 2ND RIGHT TOE;  Surgeon: Landis Martins, DPM;  Location: [place];  Service: Podiatry;  Laterality: Right;  . NO PAST SURGERIES      Allergies  Allergen Reactions  . Vicodin [Hydrocodone-Acetaminophen] Hives    Patient stated he is no longer allergic to vicodin, patient stated that he was allergic about 12 years ago.      Outpatient Medications Prior to Visit  Medication Sig Dispense Refill  . cetirizine (ZYRTEC) 10 MG tablet Take 1 tablet (10 mg total) by mouth daily. 30 tablet 1  . pantoprazole (PROTONIX) 40 MG tablet Take 1 tablet (40 mg total) by mouth 2 (two) times daily. 60 tablet 3  . rivaroxaban (XARELTO) 20 MG TABS tablet Take 1 tablet (20 mg total) by mouth daily with supper. 30 tablet 2  . Sildenafil Citrate (VIAGRA PO) 50 mg.  1  . XARELTO 20 MG TABS tablet TAKE 1 TABLET (20 MG TOTAL) BY MOUTH DAILY WITH SUPPER.  0   No facility-administered medications prior to visit.     ROS Review of Systems  Constitutional: Negative for activity change and appetite change.  HENT: Negative for sinus pressure and sore throat.   Eyes: Negative for visual disturbance.  Respiratory: Negative for cough, chest tightness and shortness of breath.   Cardiovascular: Negative for chest pain and leg swelling.  Gastrointestinal: Negative for abdominal distention, abdominal pain, constipation and diarrhea.  Endocrine: Negative.   Genitourinary: Negative for dysuria.  Musculoskeletal:       See hpi  Skin: Negative for rash.  Allergic/Immunologic: Negative.   Neurological: Negative for weakness, light-headedness and numbness.  Psychiatric/Behavioral: Negative for dysphoric mood and suicidal ideas.    Objective:  BP (!) 142/98   Pulse 78   Temp 98.1 F (36.7 C) (Oral)   Ht 5\' 9"  (1.753 m)   Wt 225 lb 3.2 oz (102.2 kg)   SpO2 93%   BMI 33.26 kg/m   BP/Weight 11/10/2017 11/09/2017 0/17/4944  Systolic BP 967 591 638  Diastolic BP 98 466 79  Wt. (Lbs) 225.2 225.4 221.4  BMI 33.26 33.29 32.7      Physical Exam  Constitutional: He is  oriented to person, place, and time. He appears well-developed and well-nourished.  Cardiovascular: Normal rate, normal heart sounds and intact distal pulses.  No murmur heard. Pulmonary/Chest: Effort normal and breath sounds normal. He has no wheezes. He has no rales. He exhibits no tenderness.  Abdominal: Soft. Bowel sounds are normal. He exhibits no distension and no mass. There is no tenderness.  Musculoskeletal:  Normal appearance of right knee, TTP of R medical tubercle of femur; normal range of motion  Neurological: He is alert and oriented to person, place, and time.  Skin: Skin is warm and dry.    Lab Results  Component Value Date   HGBA1C 6.5 (A) 09/24/2017   HGBA1C 6.5 09/24/2017   HGBA1C 6.5 (A) 09/24/2017   HGBA1C 6.5 09/24/2017    Assessment & Plan:   1. Type 2 diabetes mellitus without complication, without long-term current use of insulin (HCC) Controlled with A1c of 6.5 Commence metformin He declines taking metformin as he is insistent on working on lifestyle - POCT glucose (manual entry) - metFORMIN (GLUCOPHAGE) 500 MG tablet; Take 1 tablet (500 mg total) by mouth daily with breakfast.  Dispense: 30 tablet; Refill: 3  2. Chronic pain of right knee Likely underlying arthritis Unable to place on NSAIDs as he is currently on Xarelto - traMADol (ULTRAM) 50 MG tablet; Take 1 tablet (50 mg total) by mouth every 12 (twelve) hours as needed.  Dispense: 30 tablet; Refill: 0  3.  PE and right lower extremity DVT Diagnosed in 10/03/2017 - unprovoked Currently on anticoagulation with Xarelto Duration of anticoagulation unclear at this time as per hematology He has an upcoming appointment with hematology next month.  Meds ordered this encounter  Medications  . metFORMIN (GLUCOPHAGE) 500 MG tablet    Sig: Take 1 tablet (500 mg total) by mouth daily with breakfast.    Dispense:  30 tablet    Refill:  3  . traMADol (ULTRAM) 50 MG tablet    Sig: Take 1 tablet (50 mg  total) by mouth every 12 (twelve) hours as needed.    Dispense:  30 tablet    Refill:  0    Follow-up: Return for Follow-up of chronic medical conditions, keep previously scheduled appointment.   [name] MD

## 2017-11-10 NOTE — Patient Instructions (Signed)

## 2017-11-11 ENCOUNTER — Ambulatory Visit (HOSPITAL_COMMUNITY)
Admission: RE | Admit: 2017-11-11 | Discharge: 2017-11-11 | Disposition: A | Discharge status: Home / Self Care | Payer: Self-pay | Source: Ambulatory Visit | Attending: Family Medicine | Admitting: Family Medicine

## 2017-11-11 ENCOUNTER — Encounter (HOSPITAL_COMMUNITY): Payer: Self-pay

## 2017-11-11 ENCOUNTER — Encounter: Payer: Self-pay | Admitting: Family Medicine

## 2017-11-11 DIAGNOSIS — K429 Umbilical hernia without obstruction or gangrene: Secondary | ICD-10-CM | POA: Insufficient documentation

## 2017-11-11 DIAGNOSIS — K5730 Diverticulosis of large intestine without perforation or abscess without bleeding: Secondary | ICD-10-CM | POA: Insufficient documentation

## 2017-11-11 DIAGNOSIS — R599 Enlarged lymph nodes, unspecified: Secondary | ICD-10-CM | POA: Insufficient documentation

## 2017-11-11 MED ORDER — IOHEXOL 300 MG/ML  SOLN
100.0000 mL | Freq: Once | INTRAMUSCULAR | Status: AC | PRN
  Administered 2017-11-11: 100 mL via INTRAVENOUS

## 2017-11-13 ENCOUNTER — Telehealth: Payer: Self-pay

## 2017-11-13 NOTE — Telephone Encounter (Signed)
Patient was called and informed of normal CT scan.

## 2017-11-13 NOTE — Telephone Encounter (Signed)
-----   Message from [name], MD sent at 11/12/2017  8:45 AM EDT ----- Previous abnormal findings on CT have resolved.

## 2017-11-23 ENCOUNTER — Telehealth: Payer: Self-pay | Admitting: Family Medicine

## 2017-11-23 MED ORDER — AMOXICILLIN 500 MG PO CAPS: 500.0000 mg | ORAL_CAPSULE | Freq: Three times a day (TID) | ORAL | 0 refills | Status: DC

## 2017-11-23 NOTE — Telephone Encounter (Signed)
Patient called stating that her PCP would prescribe him some antibiotics for his sinus infection. Pelase f/u with patient.

## 2017-11-23 NOTE — Telephone Encounter (Signed)
Will route to PCP for review. 

## 2017-11-23 NOTE — Telephone Encounter (Signed)
I have sent a prescription for antibiotics to his pharmacy

## 2017-11-24 ENCOUNTER — Encounter: Payer: Self-pay | Admitting: Family Medicine

## 2017-11-24 ENCOUNTER — Ambulatory Visit: Payer: Self-pay | Attending: Family Medicine | Admitting: Family Medicine

## 2017-11-24 VITALS — BP 150/93 | HR 96 | Temp 98.0°F | Ht 69.0 in | Wt 224.8 lb

## 2017-11-24 DIAGNOSIS — G8929 Other chronic pain: Secondary | ICD-10-CM

## 2017-11-24 DIAGNOSIS — Z86718 Personal history of other venous thrombosis and embolism: Secondary | ICD-10-CM | POA: Insufficient documentation

## 2017-11-24 DIAGNOSIS — M109 Gout, unspecified: Secondary | ICD-10-CM | POA: Insufficient documentation

## 2017-11-24 DIAGNOSIS — E785 Hyperlipidemia, unspecified: Secondary | ICD-10-CM | POA: Insufficient documentation

## 2017-11-24 DIAGNOSIS — Z86711 Personal history of pulmonary embolism: Secondary | ICD-10-CM | POA: Insufficient documentation

## 2017-11-24 DIAGNOSIS — Z79899 Other long term (current) drug therapy: Secondary | ICD-10-CM | POA: Insufficient documentation

## 2017-11-24 DIAGNOSIS — E119 Type 2 diabetes mellitus without complications: Secondary | ICD-10-CM

## 2017-11-24 DIAGNOSIS — Z7901 Long term (current) use of anticoagulants: Secondary | ICD-10-CM | POA: Insufficient documentation

## 2017-11-24 DIAGNOSIS — Z885 Allergy status to narcotic agent status: Secondary | ICD-10-CM | POA: Insufficient documentation

## 2017-11-24 DIAGNOSIS — Z7984 Long term (current) use of oral hypoglycemic drugs: Secondary | ICD-10-CM | POA: Insufficient documentation

## 2017-11-24 DIAGNOSIS — M25561 Pain in right knee: Secondary | ICD-10-CM | POA: Insufficient documentation

## 2017-11-24 LAB — GLUCOSE, POCT (MANUAL RESULT ENTRY): POC Glucose: 89 mg/dl (ref 70–99)

## 2017-11-24 MED ORDER — PREDNISONE 20 MG PO TABS: 20.0000 mg | ORAL_TABLET | Freq: Every day | ORAL | 0 refills | Status: DC

## 2017-11-24 MED ORDER — CETIRIZINE HCL 10 MG PO TABS: 10.0000 mg | ORAL_TABLET | Freq: Every day | ORAL | 1 refills | Status: DC

## 2017-11-24 MED FILL — AMOXICILLIN 500 MG CAPSULE: 500 | 10 days supply | Qty: 30 | Fill #0

## 2017-11-24 MED FILL — predniSONE 20 MG TABS: 20 | 5 days supply | Qty: 5 | Fill #0

## 2017-11-24 MED FILL — PANTOPRAZOLE SOD DR 40 MG T: 40 | 30 days supply | Qty: 60 | Fill #1

## 2017-11-24 NOTE — Patient Instructions (Signed)

## 2017-11-24 NOTE — Progress Notes (Signed)
Subjective:  Patient ID: Terry Poole, male    DOB: [birth year]  Age: 52 y.o. MRN: [id number]  CC:  Knee pain  HPI Terry Poole is a 52 year old male with a history of type 2 diabetes mellitus (A1c 6.5) Hyperlipidemia, Gout, bilateral PE and right lower extremity DVT diagnosed in 09/2017 who presents today for an acute visit complaining of right medial knee pain which he describes as a 9.5/10 and feels like there is a catch whenever he walks.  He also thinks it is swollen. Currently uses tramadol with minimal relief. I had ordered a right knee x-ray 2 months ago which he is yet to have done and he informs me he was unaware that this was ordered. He previously worked at [employer] where he was always on his feet but states he has since quit. Denies history of recent trauma.  Past Medical History:  Diagnosis Date  . Bronchitis   . Bronchitis   . DVT (deep venous thrombosis) ([place])   . Gout   . Hammertoe of second toe of right foot   . Smoker     Past Surgical History:  Procedure Laterality Date  . HAMMER TOE SURGERY Right 03/10/2016   Procedure: 2ND RIGHT HAMMER TOE CORRECTION;  Surgeon: Landis Martins, DPM;  Location: [place];  Service: Podiatry;  Laterality: Right;  . MASS EXCISION Right 03/10/2016   Procedure: EXCISION OF CALLUS 2ND RIGHT TOE;  Surgeon: Landis Martins, DPM;  Location: [place];  Service: Podiatry;  Laterality: Right;  . NO PAST SURGERIES      Allergies  Allergen Reactions  . Vicodin [Hydrocodone-Acetaminophen] Hives    Patient stated he is no longer allergic to vicodin, patient stated that he was allergic about 12 years ago.      Outpatient Medications Prior to Visit  Medication Sig Dispense Refill  . pantoprazole (PROTONIX) 40 MG tablet Take 1 tablet (40 mg total) by mouth 2 (two) times daily. 60 tablet 3  . rivaroxaban (XARELTO) 20 MG TABS tablet Take 1 tablet (20 mg total) by mouth daily with supper. 30 tablet 2  .  Sildenafil Citrate (VIAGRA PO) 50 mg.  1  . traMADol (ULTRAM) 50 MG tablet Take 1 tablet (50 mg total) by mouth every 12 (twelve) hours as needed. 30 tablet 0  . cetirizine (ZYRTEC) 10 MG tablet Take 1 tablet (10 mg total) by mouth daily. 30 tablet 1  . amoxicillin (AMOXIL) 500 MG capsule Take 1 capsule (500 mg total) by mouth 3 (three) times daily. (Patient not taking: Reported on 11/24/2017) 30 capsule 0  . metFORMIN (GLUCOPHAGE) 500 MG tablet Take 1 tablet (500 mg total) by mouth daily with breakfast. (Patient not taking: Reported on 11/24/2017) 30 tablet 3  . XARELTO 20 MG TABS tablet TAKE 1 TABLET (20 MG TOTAL) BY MOUTH DAILY WITH SUPPER.  0   No facility-administered medications prior to visit.     ROS Review of Systems  Constitutional: Negative for activity change and appetite change.  HENT: Negative for sinus pressure and sore throat.   Eyes: Negative for visual disturbance.  Respiratory: Negative for cough, chest tightness and shortness of breath.   Cardiovascular: Negative for chest pain and leg swelling.  Gastrointestinal: Negative for abdominal distention, abdominal pain, constipation and diarrhea.  Endocrine: Negative.   Genitourinary: Negative for dysuria.  Musculoskeletal: Negative for joint swelling and myalgias.  Skin: Negative for rash.  Allergic/Immunologic: Negative.   Neurological: Negative for weakness, light-headedness and numbness.  Psychiatric/Behavioral:  Negative for dysphoric mood and suicidal ideas.    Objective:  BP (!) 150/93   Pulse 96   Temp 98 F (36.7 C) (Oral)   Ht 5\' 9"  (1.753 m)   Wt 224 lb 12.8 oz (102 kg)   SpO2 97%   BMI 33.20 kg/m   BP/Weight 11/24/2017 11/10/2017 12/08/7260  Systolic BP 035 597 416  Diastolic BP 93 98 384  Wt. (Lbs) 224.8 225.2 225.4  BMI 33.2 33.26 33.29     Physical Exam  Constitutional: He is oriented to person, place, and time. He appears well-developed and well-nourished.  Cardiovascular: Normal rate, normal  heart sounds and intact distal pulses.  No murmur heard. Pulmonary/Chest: Effort normal and breath sounds normal. He has no wheezes. He has no rales. He exhibits no tenderness.  Abdominal: Soft. Bowel sounds are normal. He exhibits no distension and no mass. There is no tenderness.  Musculoskeletal:  Slight edema medial aspect of right knee with tenderness on palpation of medial joint line but normal range of motion. Left knee is normal  Neurological: He is alert and oriented to person, place, and time.    Lab Results  Component Value Date   HGBA1C 6.5 (A) 09/24/2017   HGBA1C 6.5 09/24/2017   HGBA1C 6.5 (A) 09/24/2017   HGBA1C 6.5 09/24/2017     Assessment & Plan:   1. Type 2 diabetes mellitus without complication, without long-term current use of insulin (HCC) Controlled with A1c of 6.5 Continue metformin - POCT glucose (manual entry)  2. Chronic pain of right knee Referred for x-ray previously which he never underwent Advised he will need right knee x-ray Continue knee brace, apply ice, elevate leg Unable to use NSAIDs due to his being on Xarelto Continue tramadol - predniSONE (DELTASONE) 20 MG tablet; Take 1 tablet (20 mg total) by mouth daily with breakfast.  Dispense: 5 tablet; Refill: 0   Meds ordered this encounter  Medications  . predniSONE (DELTASONE) 20 MG tablet    Sig: Take 1 tablet (20 mg total) by mouth daily with breakfast.    Dispense:  5 tablet    Refill:  0  . cetirizine (ZYRTEC) 10 MG tablet    Sig: Take 1 tablet (10 mg total) by mouth daily.    Dispense:  30 tablet    Refill:  1    Follow-up: No follow-ups on file.   [name] MD

## 2017-11-25 ENCOUNTER — Emergency Department (HOSPITAL_COMMUNITY): Payer: Self-pay

## 2017-11-25 ENCOUNTER — Encounter (HOSPITAL_COMMUNITY): Payer: Self-pay | Admitting: Emergency Medicine

## 2017-11-25 ENCOUNTER — Other Ambulatory Visit: Payer: Self-pay

## 2017-11-25 ENCOUNTER — Emergency Department (HOSPITAL_BASED_OUTPATIENT_CLINIC_OR_DEPARTMENT_OTHER): Payer: Self-pay

## 2017-11-25 ENCOUNTER — Emergency Department (HOSPITAL_COMMUNITY)
Admission: EM | Admit: 2017-11-25 | Discharge: 2017-11-25 | Disposition: A | Discharge status: Home / Self Care | Payer: Self-pay | Attending: Emergency Medicine | Admitting: Emergency Medicine

## 2017-11-25 ENCOUNTER — Encounter: Payer: Self-pay | Admitting: Family Medicine

## 2017-11-25 DIAGNOSIS — Z79899 Other long term (current) drug therapy: Secondary | ICD-10-CM | POA: Insufficient documentation

## 2017-11-25 DIAGNOSIS — F17210 Nicotine dependence, cigarettes, uncomplicated: Secondary | ICD-10-CM | POA: Insufficient documentation

## 2017-11-25 DIAGNOSIS — I82431 Acute embolism and thrombosis of right popliteal vein: Secondary | ICD-10-CM | POA: Insufficient documentation

## 2017-11-25 DIAGNOSIS — Z86718 Personal history of other venous thrombosis and embolism: Secondary | ICD-10-CM

## 2017-11-25 DIAGNOSIS — E119 Type 2 diabetes mellitus without complications: Secondary | ICD-10-CM | POA: Insufficient documentation

## 2017-11-25 DIAGNOSIS — M1711 Unilateral primary osteoarthritis, right knee: Secondary | ICD-10-CM | POA: Insufficient documentation

## 2017-11-25 DIAGNOSIS — I10 Essential (primary) hypertension: Secondary | ICD-10-CM | POA: Insufficient documentation

## 2017-11-25 MED ORDER — TRAMADOL HCL 50 MG PO TABS: 50.0000 mg | ORAL_TABLET | Freq: Four times a day (QID) | ORAL | 0 refills | Status: DC | PRN

## 2017-11-25 MED FILL — traMADol HCL 50 MG TABS: 50 | 2 days supply | Qty: 8 | Fill #0

## 2017-11-25 NOTE — ED Notes (Signed)
Pt returned from X-ray.  

## 2017-11-25 NOTE — ED Triage Notes (Signed)
Pt states he has right knee pain on the side of his knee with swelling, able to walk but worse pain with walking. Pt states [facility]

## 2017-11-25 NOTE — Progress Notes (Signed)
*  Preliminary Results* Right lower extremity venous duplex completed. Right lower extremity is positive for deep vein thrombosis in the popliteal vein and superficial vein thrombosis in the SSV. There is evidence of right Baker's cyst.  11/25/2017 1:52 PM  Terry Poole [name]

## 2017-11-25 NOTE — ED Notes (Signed)
Patient states takes Xeralto for blood clot in right leg. Last dose taken was this morning.

## 2017-11-25 NOTE — Telephone Encounter (Signed)
Patient was informed of medication being sent to pharmacy on OV on 11/24/17

## 2017-11-25 NOTE — ED Provider Notes (Addendum)
[facility] EMERGENCY DEPARTMENT Provider Note   CSN: [id number] Arrival date & time: 11/25/17  1113     History   Chief Complaint Chief Complaint  Patient presents with  . Knee Pain    HPI Terry Poole is a 52 y.o. male with a past medical history of PE, DVT currently anticoagulated on Xarelto who presents to ED for right knee pain, swelling and "clicking and popping sensation."  He was [facility] and [facility] by [facility] and was told to come to the ED for an x-ray of his knee.  He reports compliance with his home Xarelto.  States that the swelling has gradually improved since he was first diagnosed with a DVT in August 2019.  He denies any injuries or falls.  States that he has had this pain in his knee for several years, a significant time before he was diagnosed with a DVT.  Denies any prior fracture, dislocations or procedures in the area, history of gout, numbness in arms or legs, chest pain or shortness of breath.  HPI  Past Medical History:  Diagnosis Date  . Bronchitis   . Bronchitis   . DVT (deep venous thrombosis) ([place])   . Gout   . Hammertoe of second toe of right foot   . Smoker     Patient Active Problem List   Diagnosis Date Noted  . DVT (deep venous thrombosis) ([place]) 10/13/2017  . Pulmonary embolism ([place]) 10/02/2017  . AKI (acute kidney injury) ([place]) 09/22/2017  . Chest pain 09/22/2017  . Hypertension 11/19/2016  . Type 2 diabetes mellitus ([place]) 08/19/2016  . Hyperlipidemia 08/19/2016  . Back pain 05/13/2016  . Hammertoe of second toe of right foot 03/06/2016  . Onychomycosis 04/16/2015  . 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 Gout 03/28/2015    Past Surgical History:  Procedure Laterality Date  . HAMMER TOE SURGERY Right 03/10/2016   Procedure: 2ND RIGHT HAMMER TOE CORRECTION;  Surgeon: Landis Martins, DPM;  Location: [facility];  Service: Podiatry;  Laterality: Right;  . MASS EXCISION  Right 03/10/2016   Procedure: EXCISION OF CALLUS 2ND RIGHT TOE;  Surgeon: Landis Martins, DPM;  Location: [facility];  Service: Podiatry;  Laterality: Right;  . NO PAST SURGERIES          Home Medications    Prior to Admission medications   Medication Sig Start Date End Date Taking? Authorizing Provider  amoxicillin (AMOXIL) 500 MG capsule Take 1 capsule (500 mg total) by mouth 3 (three) times daily. Patient not taking: Reported on 11/24/2017 11/23/17   [name], MD  cetirizine (ZYRTEC) 10 MG tablet Take 1 tablet (10 mg total) by mouth daily. 11/24/17   [name], MD  metFORMIN (GLUCOPHAGE) 500 MG tablet Take 1 tablet (500 mg total) by mouth daily with breakfast. Patient not taking: Reported on 11/24/2017 11/10/17   [name], MD  pantoprazole (PROTONIX) 40 MG tablet Take 1 tablet (40 mg total) by mouth 2 (two) times daily. 10/19/17   Levin Erp, PA  predniSONE (DELTASONE) 20 MG tablet Take 1 tablet (20 mg total) by mouth daily with breakfast. 11/24/17   [name], MD  rivaroxaban (XARELTO) 20 MG TABS tablet Take 1 tablet (20 mg total) by mouth daily with supper. 10/22/17   [name], MD  Sildenafil Citrate (VIAGRA PO) 50 mg. 11/02/17   [provider]  traMADol (ULTRAM) 50 MG tablet Take 1 tablet (50 mg total) by mouth  every 6 (six) hours as needed. 11/25/17   Jaycee Mckellips, PA-C  XARELTO 20 MG TABS tablet TAKE 1 TABLET (20 MG TOTAL) BY MOUTH DAILY WITH SUPPER. 10/22/17   [provider]    Family History Family History  Problem Relation Age of Onset  . Heart disease Neg Hx   . Kidney disease Neg Hx     Social History Social History   Tobacco Use  . Smoking status: Current Every Day Smoker    Packs/day: 0.25    Types: Cigarettes  . Smokeless tobacco: Never Used  . Tobacco comment: 8 cigs daily  Substance Use Topics  . Alcohol use: No  . Drug use: No     Allergies   Vicodin  [hydrocodone-acetaminophen]   Review of Systems Review of Systems  Constitutional: Negative for appetite change, chills and fever.  HENT: Negative for ear pain, rhinorrhea, sneezing and sore throat.   Eyes: Negative for photophobia and visual disturbance.  Respiratory: Negative for cough, chest tightness, shortness of breath and wheezing.   Cardiovascular: Negative for chest pain and palpitations.  Gastrointestinal: Negative for abdominal pain, blood in stool, constipation, diarrhea, nausea and vomiting.  Genitourinary: Negative for dysuria, hematuria and urgency.  Musculoskeletal: Positive for arthralgias and joint swelling. Negative for myalgias.  Skin: Negative for rash.  Neurological: Negative for dizziness, weakness and light-headedness.     Physical Exam Updated Vital Signs BP (!) 131/101 (BP Location: Right Arm)   Pulse 83   Temp 97.7 F (36.5 C) (Oral)   Resp 18   SpO2 99%   Physical Exam  Constitutional: He appears well-developed and well-nourished. No distress.  HENT:  Head: Normocephalic and atraumatic.  Nose: Nose normal.  Eyes: Conjunctivae and EOM are normal. Left eye exhibits no discharge. No scleral icterus.  Neck: Normal range of motion. Neck supple.  Cardiovascular: Normal rate, regular rhythm, normal heart sounds and intact distal pulses. Exam reveals no gallop and no friction rub.  No murmur heard. Pulmonary/Chest: Effort normal and breath sounds normal. No respiratory distress.  Abdominal: Soft. Bowel sounds are normal. He exhibits no distension. There is no tenderness. There is no guarding.  Musculoskeletal: Normal range of motion. He exhibits tenderness (Right knee and right calf). He exhibits no edema.  No edema, warmth of joint noted.  Full active and passive range of motion of joint without difficulty.  2+ DP pulse palpated.  Neurological: He is alert. He exhibits normal muscle tone. Coordination normal.  Skin: Skin is warm and dry. No rash noted.   Psychiatric: He has a normal mood and affect.  Nursing note and vitals reviewed.    ED Treatments / Results  Labs (all labs ordered are listed, but only abnormal results are displayed) Labs Reviewed - No data to display  EKG None  Radiology Dg Knee Complete 4 Views Right  Result Date: 11/25/2017 CLINICAL DATA:  Chronic right knee pain. EXAM: RIGHT KNEE - COMPLETE 4+ VIEW COMPARISON:  Radiographs of September 29, 2016. FINDINGS: No evidence of fracture, dislocation, or joint effusion. Moderate narrowing of the medial, lateral and patellofemoral spaces is noted. Soft tissues are unremarkable. IMPRESSION: Moderate tricompartmental degenerative joint disease. No acute abnormality seen in the right knee. Electronically Signed   By: Marijo Conception, M.D.   On: 11/25/2017 12:11    Procedures Procedures (including critical care time)  Medications Ordered in ED Medications - No data to display   Initial Impression / Assessment and Plan / ED Course  I have reviewed  the triage vital signs and the nursing notes.  Pertinent labs & imaging results that were available during my care of the patient were reviewed by me and considered in my medical decision making (see chart for details).  Clinical Course as of Nov 25 1421  Wed Nov 25, 2017  1403 *Preliminary Results* Right lower extremity venous duplex completed. Right lower extremity is positive for deep vein thrombosis in the popliteal vein and superficial vein thrombosis in the SSV. There is evidence of right Baker's cyst.  11/25/2017 1:52 PM  Abram Sander    [HK]    Clinical Course User Index [HK] Delia Heady, PA-C    52 year old male with past medical history of PE, DVT currently anticoagulated on Xarelto presents to ED for right knee pain, swelling and discomfort.  Told to come to the ED from his PCPs office for an x-ray.  He reports compliance with his home Xarelto.  He has had pain in this knee since before he was diagnosed with  a DVT in August 2019.  Denies any injuries or falls, chest pain or shortness of breath.  Physical exam findings show mild edema, tenderness to palpation of the right knee with no changes to range of motion noted.  Area is neurovascularly intact.  X-ray shows degenerative joint disease with no acute abnormality.  Vascular ultrasound positive for DVT in the popliteal vein.  I reviewed his findings from August 2019 most with a DVT.  This was present in his right popliteal and femoral veins.  He was seen by Dr. Oneida Alar of vascular surgery at the time and was told to continue anticoagulation.  He currently sees a hematologist.  It appears that the DVT has improved since his study 2 months ago.  He denies any chest pain, shortness of breath that would concern me for PE.  He is not tachypneic, tachycardic or hypoxic.  His symptoms could be due to his Baker's cyst or his osteoarthritis.  Will give short course of pain medication and advised him to continue his anticoagulation as directed.  It appears that he was given a prescription for prednisone by his PCP yesterday.  Advised to return to ED for any severe worsening symptoms. Patient discussed with my attending, Dr. Eulis Foster. [place] PMP reviewed with no discrepancies.  Portions of this note were generated with [employer]. Dictation errors may occur despite best attempts at proofreading.   Final Clinical Impressions(s) / ED Diagnoses   Final diagnoses:  Primary osteoarthritis of right knee  Deep vein thrombosis (DVT) of popliteal vein of right lower extremity, unspecified chronicity [facility])    ED Discharge Orders         Ordered    traMADol (ULTRAM) 50 MG tablet  Every 6 hours PRN     11/25/17 1421            Delia Heady, PA-C 11/25/17 1427    Daleen Bo, MD 11/25/17 1758

## 2017-11-25 NOTE — Discharge Instructions (Signed)
Your ultrasound showed that you had a blood clot in your right popliteal vein.  This was present, but improved, since your visit in August. Take your Xarelto at the prescribed dose. Take pain medication as needed. Return to ED for worsening symptoms, worsening swelling, injuries or falls, chest pain or shortness of breath.

## 2017-11-25 NOTE — ED Notes (Signed)
Patient transported to ultrasound.

## 2017-11-27 MED FILL — XARELTO 20 MG TABLET: 20 | 30 days supply | Qty: 30 | Fill #2

## 2017-12-01 ENCOUNTER — Telehealth: Payer: Self-pay | Admitting: Family Medicine

## 2017-12-01 NOTE — Telephone Encounter (Signed)
Needs to come to office visit

## 2017-12-07 ENCOUNTER — Emergency Department (HOSPITAL_COMMUNITY): Payer: Self-pay

## 2017-12-07 ENCOUNTER — Encounter (HOSPITAL_COMMUNITY): Payer: Self-pay | Admitting: Emergency Medicine

## 2017-12-07 ENCOUNTER — Emergency Department (HOSPITAL_COMMUNITY)
Admission: EM | Admit: 2017-12-07 | Discharge: 2017-12-07 | Disposition: A | Discharge status: Home / Self Care | Payer: Self-pay | Attending: Emergency Medicine | Admitting: Emergency Medicine

## 2017-12-07 DIAGNOSIS — M545 Low back pain, unspecified: Secondary | ICD-10-CM

## 2017-12-07 DIAGNOSIS — F17210 Nicotine dependence, cigarettes, uncomplicated: Secondary | ICD-10-CM | POA: Insufficient documentation

## 2017-12-07 DIAGNOSIS — I10 Essential (primary) hypertension: Secondary | ICD-10-CM | POA: Insufficient documentation

## 2017-12-07 DIAGNOSIS — R079 Chest pain, unspecified: Secondary | ICD-10-CM | POA: Insufficient documentation

## 2017-12-07 DIAGNOSIS — Z86718 Personal history of other venous thrombosis and embolism: Secondary | ICD-10-CM | POA: Insufficient documentation

## 2017-12-07 DIAGNOSIS — E785 Hyperlipidemia, unspecified: Secondary | ICD-10-CM | POA: Insufficient documentation

## 2017-12-07 DIAGNOSIS — Z79899 Other long term (current) drug therapy: Secondary | ICD-10-CM | POA: Insufficient documentation

## 2017-12-07 DIAGNOSIS — E119 Type 2 diabetes mellitus without complications: Secondary | ICD-10-CM | POA: Insufficient documentation

## 2017-12-07 LAB — BASIC METABOLIC PANEL
ANION GAP: 11 (ref 5–15)
BUN: 11 mg/dL (ref 6–20)
CALCIUM: 9.1 mg/dL (ref 8.9–10.3)
CO2: 24 mmol/L (ref 22–32)
CREATININE: 1.17 mg/dL (ref 0.61–1.24)
Chloride: 108 mmol/L (ref 98–111)
GFR calc Af Amer: >60 mL/min (ref >60)
GLUCOSE: 163 mg/dL — AB (ref 70–99)
Potassium: 3.4 mmol/L — ABNORMAL LOW (ref 3.5–5.1)
Sodium: 143 mmol/L (ref 135–145)

## 2017-12-07 LAB — CBC
HCT: 43 % (ref 39.0–52.0)
Hemoglobin: 13.5 g/dL (ref 13.0–17.0)
MCH: 27.1 pg (ref 26.0–34.0)
MCHC: 31.4 g/dL (ref 30.0–36.0)
MCV: 86.2 fL (ref 80.0–100.0)
PLATELETS: 294 10*3/uL (ref 150–400)
RBC: 4.99 MIL/uL (ref 4.22–5.81)
RDW: 15.1 % (ref 11.5–15.5)
WBC: 7.3 10*3/uL (ref 4.0–10.5)
nRBC: 0 % (ref 0.0–0.2)

## 2017-12-07 LAB — POCT I-STAT TROPONIN I
TROPONIN I, POC: 0.01 ng/mL (ref 0.00–0.08)
Troponin i, poc: 0 ng/mL (ref 0.00–0.08)

## 2017-12-07 MED ORDER — NITROGLYCERIN 0.4 MG SL SUBL
0.4000 mg | SUBLINGUAL_TABLET | SUBLINGUAL | Status: DC | PRN
  Filled 2017-12-07: qty 1

## 2017-12-07 MED ORDER — LIDOCAINE 5 % EX PTCH: 1.0000 | MEDICATED_PATCH | CUTANEOUS | 0 refills | Status: AC

## 2017-12-07 MED ORDER — KETOROLAC TROMETHAMINE 15 MG/ML IJ SOLN
30.0000 mg | Freq: Once | INTRAMUSCULAR | Status: AC
  Administered 2017-12-07: 30 mg via INTRAVENOUS
  Filled 2017-12-07 (×2): qty 2

## 2017-12-07 NOTE — Discharge Instructions (Addendum)
I have provided a prescription for lidocaine patches to help with your back pain. Your workup today was unremarkable, you may follow up with your PCP. If you experience any chest pain, shortness of breath you may return to the ED for reevaluation

## 2017-12-07 NOTE — ED Provider Notes (Signed)
[facility] DEPT Provider Note   CSN: [id number] Arrival date & time: 12/07/17  1332     History   Chief Complaint Chief Complaint  Patient presents with  . Chest Pain  . Back Pain    HPI Terry Poole is a 51 y.o. male.  52 y/o male with a PMH of DVT, PE, AKI, HTN, presents to the ED with a chief complaint chest pain x 2 days. Patient describes his pain intermittent "stinging" in nature in the middle of his chest with no radiation. He reports his pain is worse with walking, and denies any alleviating symptoms. He has not tried taking any medication for his pain.He also reports some nausea. He does have a history of DVT, PE and is currently on Xarelto, he reports being compliant with his medication. He was seen on 10/23 for right leg pain and had a negative ultrasound study. Today he is experiencing some left leg pain behind his knee. Patient also reports lower back pain today with no radiation which is worse with ambulating, he reports this pain is new to him. He denies any fever, thoracic back pain,abdominal pain, vomiting, diarrhea or urinary complaints.      Past Medical History:  Diagnosis Date  . Bronchitis   . Bronchitis   . DVT (deep venous thrombosis) ([place])   . Gout   . Hammertoe of second toe of right foot   . Smoker     Patient Active Problem List   Diagnosis Date Noted  . DVT (deep venous thrombosis) ([place]) 10/13/2017  . Pulmonary embolism ([place]) 10/02/2017  . AKI (acute kidney injury) ([place]) 09/22/2017  . Chest pain 09/22/2017  . Hypertension 11/19/2016  . Type 2 diabetes mellitus ([place]) 08/19/2016  . Hyperlipidemia 08/19/2016  . Back pain 05/13/2016  . Hammertoe of second toe of right foot 03/06/2016  . Onychomycosis 04/16/2015  . Pseudofolliculitis barbae 85/27/7824  . Gout 03/28/2015    Past Surgical History:  Procedure Laterality Date  . HAMMER TOE SURGERY Right 03/10/2016   Procedure: 2ND RIGHT HAMMER TOE CORRECTION;   Surgeon: Landis Martins, DPM;  Location: [facility];  Service: Podiatry;  Laterality: Right;  . MASS EXCISION Right 03/10/2016   Procedure: EXCISION OF CALLUS 2ND RIGHT TOE;  Surgeon: Landis Martins, DPM;  Location: [facility];  Service: Podiatry;  Laterality: Right;  . NO PAST SURGERIES          Home Medications    Prior to Admission medications   Medication Sig Start Date End Date Taking? Authorizing Provider  amoxicillin (AMOXIL) 500 MG capsule Take 1 capsule (500 mg total) by mouth 3 (three) times daily. 11/23/17  Yes [name], MD  cetirizine (ZYRTEC) 10 MG tablet Take 1 tablet (10 mg total) by mouth daily. 11/24/17  Yes [name], MD  pantoprazole (PROTONIX) 40 MG tablet Take 1 tablet (40 mg total) by mouth 2 (two) times daily. Patient taking differently: Take 40 mg by mouth daily.  10/19/17  Yes Levin Erp, PA  rivaroxaban (XARELTO) 20 MG TABS tablet Take 1 tablet (20 mg total) by mouth daily with supper. 10/22/17  Yes [name], MD  Sildenafil Citrate (VIAGRA PO) 50 mg. 11/02/17  Yes [provider]  traMADol (ULTRAM) 50 MG tablet Take 1 tablet (50 mg total) by mouth every 6 (six) hours as needed. 11/25/17  Yes Khatri, Hina, PA-C  lidocaine (LIDODERM) 5 % Place 1 patch onto the skin daily. Remove & Discard patch within  12 hours or as directed by MD 12/07/17 01/06/18  Janeece Fitting, PA-C  metFORMIN (GLUCOPHAGE) 500 MG tablet Take 1 tablet (500 mg total) by mouth daily with breakfast. Patient not taking: Reported on 11/24/2017 11/10/17   [name], MD  predniSONE (DELTASONE) 20 MG tablet Take 1 tablet (20 mg total) by mouth daily with breakfast. 11/24/17   [name], MD    Family History Family History  Problem Relation Age of Onset  . Heart disease Neg Hx   . Kidney disease Neg Hx     Social History Social History   Tobacco Use  . Smoking status: Current Every Day Smoker    Packs/day: 0.25    Types:  Cigarettes  . Smokeless tobacco: Never Used  . Tobacco comment: 8 cigs daily  Substance Use Topics  . Alcohol use: No  . Drug use: No     Allergies   Vicodin [hydrocodone-acetaminophen]   Review of Systems Review of Systems  Constitutional: Negative for chills and fever.  HENT: Negative for sore throat.   Respiratory: Negative for shortness of breath.   Cardiovascular: Negative for chest pain.  Gastrointestinal: Negative for abdominal pain, diarrhea, nausea and vomiting.  Genitourinary: Negative for dysuria and flank pain.  Musculoskeletal: Positive for back pain and myalgias.  Skin: Negative for pallor and wound.  Neurological: Negative for headaches.     Physical Exam Updated Vital Signs BP (!) 147/105   Pulse 77   Temp 97.9 F (36.6 C) (Oral)   Resp 17   SpO2 97%   Physical Exam  Constitutional: He is oriented to person, place, and time. He appears well-developed and well-nourished. He does not appear ill. No distress.  Neck: Normal range of motion. Neck supple.  Pulmonary/Chest: Effort normal and breath sounds normal. He has no decreased breath sounds. He has no wheezes.  Abdominal: Soft. Bowel sounds are normal. There is no tenderness.  Musculoskeletal:       Right lower leg: Normal.       Left lower leg: Normal.  Neurological: He is alert and oriented to person, place, and time.  Skin: Skin is warm and dry.  Nursing note and vitals reviewed.    ED Treatments / Results  Labs (all labs ordered are listed, but only abnormal results are displayed) Labs Reviewed  BASIC METABOLIC PANEL - Abnormal; Notable for the following components:      Result Value   Potassium 3.4 (*)    Glucose, Bld 163 (*)    All other components within normal limits  CBC  I-STAT TROPONIN, ED  POCT I-STAT TROPONIN I  I-STAT TROPONIN, ED  POCT I-STAT TROPONIN I    EKG EKG Interpretation  Date/Time:  Monday December 07 2017 13:39:33 EST Ventricular Rate:  96 PR Interval:      QRS Duration: 95 QT Interval:  359 QTC Calculation: 454 R Axis:   64 Text Interpretation:  Sinus rhythm RSR' in V1 or V2, probably normal variant Borderline T wave abnormalities No significant change since last tracing Confirmed by Gareth Morgan [phone]) on 12/07/2017 3:58:58 PM   Radiology Dg Chest 2 View  Result Date: 12/07/2017 CLINICAL DATA:  Chest pain. EXAM: CHEST - 2 VIEW COMPARISON:  Radiographs of September 24, 2017. FINDINGS: The heart size and mediastinal contours are within normal limits. Both lungs are clear. No pneumothorax or pleural effusion is noted. The visualized skeletal structures are unremarkable. IMPRESSION: No active cardiopulmonary disease. Electronically Signed   By: Marijo Conception, M.D.  On: 12/07/2017 13:55   Dg Lumbar Spine 2-3 Views  Result Date: 12/07/2017 CLINICAL DATA:  52 year old male with non radiating lumbar spine pain for several days. EXAM: LUMBAR SPINE - 2-3 VIEW COMPARISON:  CT Abdomen and Pelvis 11/11/2017. Lumbar radiographs 09/22/2015. Chest CT 10/02/2017. FINDINGS: Hypoplastic ribs at T12 with mildly transitional lumbosacral anatomy in the form of partially sacralized L5 level (on the left). Chronic increased sclerosis in the lumbar spine appears degenerative. Bulky chronic endplate osteophytosis and widespread disc space loss. Chronic vacuum disc at L4-L5. Stable vertebral height and alignment with straightening of lordosis and mild dextroconvex curvature. No acute osseous abnormality identified. Visible sacral ala and SI joints remain normal. Negative visible abdominal visceral contour. IMPRESSION: 1. No acute osseous abnormality identified in the lumbar spine. 2. Diffuse idiopathic skeletal hyperostosis (DISH) and advanced chronic disc degeneration at L4-L5. Electronically Signed   By: Genevie Ann M.D.   On: 12/07/2017 17:28    Procedures Procedures (including critical care time)  Medications Ordered in ED Medications  ketorolac (TORADOL) 15 MG/ML  injection 30 mg (30 mg Intravenous Given 12/07/17 1657)     Initial Impression / Assessment and Plan / ED Course  I have reviewed the triage vital signs and the nursing notes.  Pertinent labs & imaging results that were available during my care of the patient were reviewed by me and considered in my medical decision making (see chart for details).  Patient presents with chest pain along with lower lumbar pain. Patient does have a history of PE but is currently on Xarelto. Workup in the ED has been unremarkable CBC showed no leukocytosis, hemoglobin is stable. BMP showed slight decrease in potassium but creatine is unchanged from previous. First troponin was negative, EKG was unchanged from previous with no signs of infarct or STEMI. DG chest 2 view showed no pleural effusion or pneumothorax.  Patient complains of low back pain obtained a DG lumbar to r/o any acute abnormality. Showed: 1. No acute osseous abnormality identified in the lumbar spine.  2. Diffuse idiopathic skeletal hyperostosis (DISH) and advanced  chronic disc degeneration at L4-L5.   I have personally reviewed patient's chart he has multiple visits and had two CT angio studies which showed no aneurysm, suspicion for dissection, he is not hypotensive or complaining of any pain radiating to his mid back.  Patient had a visit last week for his right leg pain which obtain an ultrasound of his right leg, today he reports pain on the left leg.  Upon examination there is no swelling, tenderness, redness to the area, suspicion for any DVT as patient is currently states he is compliant with Xarelto.  His last visit he was discharged with tramadol 50 mg I have advised him he could take this for the pain in order to get some symptomatic relief from his back pain I will also give him some lidocaine patches to take home with him. Troponin is also negative, low suspicion for any ACS as patient's pain was relieved with Toradol.  Vitals stable during  ED visit, patient stable for discharge.  Final Clinical Impressions(s) / ED Diagnoses   Final diagnoses:  Acute bilateral low back pain without sciatica    ED Discharge Orders         Ordered    lidocaine (LIDODERM) 5 %  Every 24 hours     12/07/17 1811           Janeece Fitting, PA-C 12/07/17 1811    Virgel Manifold, MD  12/10/17 1013  

## 2017-12-07 NOTE — ED Notes (Signed)
Patient transported to X-ray 

## 2017-12-07 NOTE — ED Notes (Signed)
Patient given discharge teaching and verbalized understanding. Patient ambulated out of ED with a steady gait. 

## 2017-12-07 NOTE — ED Triage Notes (Signed)
Pt c/o chest pains, lower back pains and bilat arm pains for couple days. Reports he had blood clots when was seen on Oct 23 and was started on Xeralto.

## 2017-12-08 ENCOUNTER — Encounter: Payer: Self-pay | Admitting: Hematology and Oncology

## 2017-12-08 ENCOUNTER — Inpatient Hospital Stay: Payer: Self-pay | Attending: Hematology and Oncology | Admitting: Hematology and Oncology

## 2017-12-08 ENCOUNTER — Telehealth: Payer: Self-pay | Admitting: Hematology and Oncology

## 2017-12-08 VITALS — BP 145/101 | HR 84 | Temp 99.0°F | Resp 18 | Ht 69.0 in | Wt 228.8 lb

## 2017-12-08 DIAGNOSIS — I2699 Other pulmonary embolism without acute cor pulmonale: Secondary | ICD-10-CM

## 2017-12-08 DIAGNOSIS — Z86711 Personal history of pulmonary embolism: Secondary | ICD-10-CM | POA: Insufficient documentation

## 2017-12-08 DIAGNOSIS — Z7901 Long term (current) use of anticoagulants: Secondary | ICD-10-CM | POA: Insufficient documentation

## 2017-12-08 DIAGNOSIS — Z86718 Personal history of other venous thrombosis and embolism: Secondary | ICD-10-CM | POA: Insufficient documentation

## 2017-12-08 MED FILL — LIDOCAINE PATCH 5%: 5 | 30 days supply | Qty: 30 | Fill #0

## 2017-12-08 NOTE — Patient Instructions (Signed)
We discussed again the results of your recent laboratory studies which showed a hemoglobin of 12.8 g/dL.  It is very slightly below normal.  A repeat complete blood count however on November 4 in the emergency department at [facility] revealed hemoglobin 13.5 g/dL.  This is normal.  Continue supportive care of your left lower extremity to include heat, elevation when at home, and consider support stockings.  These can be obtained through your primary care office.  Barring any unforeseen complications, your neck scheduled doctor visit is on February 23, 1998 3:20 months on anticoagulation therapy with rivaroxaban.

## 2017-12-08 NOTE — Telephone Encounter (Signed)
Appts scheduled avs/calendar printed per 11/5 los °

## 2017-12-08 NOTE — Progress Notes (Signed)
Hematology/Oncology Outpatient  Progress Note  Patient Name:  Terry Poole  DOB: 1965-04-10   Date of Service: December 08, 2017  Referring Provider: [name] MD [address] [place], [place] [address]   Consulting Physician: Henreitta Leber, MD Hematology/Oncology  Reason for Visit: In the setting of an acute pulmonary embolism, and right lower extremity deep vein thrombosis identified at the time of CT angiography on October 02, 2017 at [facility]; having undergone a hypercoagulable evaluation; found also to be anemic over an indeterminate period; currently on rivaroxaban without complication, he presents now for the results of his preliminary laboratory evaluation and recommendations.  Brief History: Terry Poole is a 52 year old resident of [place] whose past medical history is significant for non-insulin-requiring diabetes mellitus, on dietary "control;" dyslipidemia; gastroesophageal reflux disease; degenerative joint disease involving the right knee and shoulders; erectile dysfunction; chronic sinusitis; active tobacco dependence; previous alcohol dependence, now discontinued;and gouty arthritis associated with pain in the left toe, now resolved.  His primary care provider is Dr. [name].  He is alone at this visit.  On October 02, 2017 he presented to the emergency department at [facility] emergency department complaining of increased swelling and pain in the right leg, worsening now over the past several months along with substernal chest pain which he reports began several days prior to his presentation.  On admission CT angiography of the chest was performed the results of which revealed bilateral pulmonary emboli involving the lower lobe and segmental and subsegmental branches.  A 2D echocardiogram was performed on September 2, the results of which revealed no evidence of right strain or cor pulmonale.  Those results are detailed  below.    While hospitalized, he received unfractionated heparin both by bolus and continuous infusion by protocol beginning 11:00 AM on August 30.  A careful review of both his medication administration and laboratory studies suggest that a battery of laboratory studies to evaluate a hereditary/acquired thrombophilia was obtained on August 30 at 11:21 AM.  Thus it appears that he received unfractionated heparin bolus/infusion before his laboratory evaluation.   The laboratory evaluation on August 30 revealed PT/INR 15.3/1.22 PTT 28 Antithrombin III 91 protein C activity 94 protein S activity 40 (63-140%) lupus anticoagulant was affected by the presence of heparin (after heparin neutralization) it normalized; beta-2 glycoprotein 1 IgG <9; beta-2 glycoprotein 1 IgM <9; beta-2 glycoprotein 1 IgA <9; homocystine 13.7; factor V Leiden gene mutation by PCR: Negative; prothrombin gene mutation [id number]: Not identified; anticardiolipin antibodies IgG <9; anticardiolipin antibody IgA <9; anticardiolipin antibody IgM 13 (indeterminate 12-20).   Starting heparin before the vitamin K dependent coagulation studies would clearly contaminate the results of both the lupus anticoagulant and protein S activity.  After identifying heparin within the lupus anticoagulant test specimen, it was first neutralized and secondarily identified as a normal lupus anticoagulant. With protein S activity, heparin effect cannot be reversed.    Following his hospital discharge and start of treatment with rivaroxaban, his chest pain has improved significantly. He took medication at one time for gout, but thinks he has no gouty arthritis symptoms.  He has no prior blood disorder or bleeding tendency.  There is no history of blood disorder, bleeding tendency, or easy bruisability within his family.  For a lengthy period of time he has had "numbness" of the fifth digit of his right foot on a constant basis.  He denies any numbness or tingling  in his fingers.  At the time of his initial visit, we discussed in detail his recent hospitalization at [facility] and his the diagnosis of bilateral pulmonary emboli and right lower extremity deep vein thrombosis.  Since starting treatment both his chest pain and right lower extremity pain have improved.  It was emphasized that it will take time before the pain and swelling subside.  With regard to his hypercoagulable work-up done while hospitalized, the very mild elevation in anticardiolipin antibody IgM (13) is not of clinical significance.  In the setting of an acute thrombotic event, frequently the anticardiolipin antibody profile is abnormal.  This very mild elevation is inconsequential.  It is recommended that it be repeated at a 12-week interval for completeness. He has been compliant with rivaroxaban.  On November 25, 2017 a repeat right lower extremity venous Doppler was performed.  Those results are detailed below.  There was no evidence of clot progression within the right lower extremity.  The findings on Doppler were consistent with an age-indeterminate deep vein thrombosis involving the right popliteal vein.  A superficial vein thrombosis involving the right small saphenous vein was seen.  There is a cystic structure found in the popliteal fossa.  On the left there is no evidence of common femoral vein obstruction. These findings are consistent with a residual organized thrombus.  Because of his anemia persisted following his hospital discharge, an anemia work-up was obtained today to exclude iron deficiency and/or hemolysis.  There is no clinical or laboratory suggestion that he has a nutritional deficiency.  On October 7, his hemoglobin was 12.8 g/dL.  Those results are detailed below. A complete blood count was obtained at that time he had a right lower extremity duplex venous Doppler.  Those results are detailed below.  His hemoglobin/hematocrit; white blood cell count; and  platelets were normal. It is with this background he presents now for further discussion and recommendations in the setting of an acute onset bilateral pulmonary embolism and right lower extremity deep vein thrombosis without evidence of right heart strain or cor pulmonale, currently on rivaroxaban: 20 mg once daily, as outlined above.  Interval History: In the interim since his last visit, his left knee continues to bother him. He states also that he has degenerative joint disease involving the right knee which contributes to his pain.  Both his appetite and weight remain stable.  He reports no rash or itching.  He reports no unusual headache, dizziness, lightheadedness, syncope, or near syncopal episodes.  He denies any new visual changes or hearing deficit.  He has no unusual cough, sore throat, or orthopnea. He has no pain or difficulty in swallowing.  He denies dyspnea either at rest or on minimal exertion. No fever, shaking chills, sweats, or flulike symptoms are reported.  With medication, he has no heartburn or indigestion.  There has infrequent nausea without vomiting, now improved with pantoprazole. He denies diarrhea or constipation.  He moves his bowels twice daily.  He denies melena or bright red blood per rectum.  No urinary frequency, urgency, hematuria, or dysuria are evident. He continues to have mild swelling of the right ankle extending to the mid calf along with tenderness above the knee, but steadily improving.    Past Medical History Reviewed        Family History Reviewed       Social History Reviewed  Past Medical History:  Diagnosis Date  . Bronchitis   . Bronchitis   . DVT (deep venous thrombosis) ([place])   .  Gout   . Hammertoe of second toe of right foot   . Smoker         Past Surgical History:  Procedure Laterality Date  . HAMMER TOE SURGERY Right 03/10/2016   Procedure: 2ND RIGHT HAMMER TOE CORRECTION;  Surgeon: Landis Martins, DPM;  Location: [facility];  Service: Podiatry;  Laterality: Right;  . MASS EXCISION Right 03/10/2016   Procedure: EXCISION OF CALLUS 2ND RIGHT TOE;  Surgeon: Landis Martins, DPM;  Location: [facility];  Service: Podiatry;  Laterality: Right;  . NO PAST SURGERIES     Allergies  Allergen Reactions  . Vicodin [Hydrocodone-Acetaminophen] Hives    Patient stated he is no longer allergic to vicodin, patient stated that he was allergic about 12 years ago.    Current Outpatient Medications on File Prior to Visit  Medication Sig  . amoxicillin (AMOXIL) 500 MG capsule Take 1 capsule (500 mg total) by mouth 3 (three) times daily.  . cetirizine (ZYRTEC) 10 MG tablet Take 1 tablet (10 mg total) by mouth daily.  Marland Kitchen lidocaine (LIDODERM) 5 % Place 1 patch onto the skin daily. Remove & Discard patch within 12 hours or as directed by MD  . metFORMIN (GLUCOPHAGE) 500 MG tablet Take 1 tablet (500 mg total) by mouth daily with breakfast. (Patient not taking: Reported on 11/24/2017)  . pantoprazole (PROTONIX) 40 MG tablet Take 1 tablet (40 mg total) by mouth 2 (two) times daily. (Patient taking differently: Take 40 mg by mouth daily. )  . predniSONE (DELTASONE) 20 MG tablet Take 1 tablet (20 mg total) by mouth daily with breakfast.  . rivaroxaban (XARELTO) 20 MG TABS tablet Take 1 tablet (20 mg total) by mouth daily with supper.  . Sildenafil Citrate (VIAGRA PO) 50 mg.  . traMADol (ULTRAM) 50 MG tablet Take 1 tablet (50 mg total) by mouth every 6 (six) hours as needed.   No current facility-administered medications on file prior to visit.     Review of Systems: Constitutional: No fever, sweats, or shaking chills.  No appetite or weight deficit. Skin: No rash, scaling, sores, lumps, or jaundice. HEENT: No visual changes or hearing deficit; chronic sinusitis, now quiescent. Pulmonary: No unusual cough, sore throat, or orthopnea; active smoker.  No DOE. Cardiovascular: No coronary artery disease, angina, or myocardial  infarction.  No cardiac dysrhythmia; essential hypertension and dyslipidemia. Gastrointestinal: No indigestion, dysphagia, abdominal pain, diarrhea, or constipation.  No change in bowel habits.  No melena or bright red blood per rectum; GERD. Genitourinary: No urinary frequency, urgency, hematuria, or dysuria; erectile dysfunction. Musculoskeletal: Right knee pain.  No new arthralgias or myalgias; no joint swelling, pain, or instability; gout. Hematologic: No bleeding tendency or easy bruisability; anemia. Endocrine: No intolerance to heat or cold; no thyroid disease or diabetes mellitus. Vascular: No peripheral arterial; DVT/PE (October 02, 2017). Psychological: No anxiety, depression, or mood changes; no mental health illnesses. Neurological: No dizziness, lightheadedness, syncope, or near syncopal episodes; no numbness or tingling in the fingers.  He has constant nonpainful numbing of the right fifth digit.  Physical Examination: Vital Signs: Body surface area is 2.25 meters squared.  Vitals:   12/08/17 1405  BP: (!) 145/101  Pulse: 84  Resp: 18  Temp: 99 F (37.2 C)  SpO2: 98%    Filed Weights   12/08/17 1405  Weight: 228 lb 12.8 oz (103.8 kg)  Body mass index is 33.79 kg/m. ECOG PERFORMANCE STATUS: 1 Constitutional:  Terry Poole is a fully  nourished and developed African-American albeit overweight.  He looks age appropriate.  He is friendly and cooperative without respiratory compromise at rest. Skin: No rashes, scaling, dryness, jaundice, or itching. HEENT: Head is normocephalic and atraumatic.  Pupils are equal round and reactive to light and accommodation.  Sclerae are anicteric.  Bilateral arcus senilis.  Conjunctivae are pink.  No sinus tenderness nor oropharyngeal lesions.  Lips without cracking or peeling; tongue without mass, inflammation, or nodularity.  Mucous membranes are moist. Neck: Supple and symmetric.  No jugular venous distention or thyromegaly.  Trachea is  midline. Lymphatics: No cervical or supraclavicular lymphadenopathy.  No epitrochlear, axillary, or inguinal lymphadenopathy is appreciated. Respiratory/chest: Thorax is symmetrical.  Breath sounds are clear to auscultation and percussion.  Normal excursion and respiratory effort. Back: Symmetric without deformity or tenderness. Cardiovascular: Heart rate and rhythm are regular without murmurs or gallops. Gastrointestinal: Abdomen is protuberant, soft, nontender; no organomegaly.  Bowel sounds are normoactive.  No masses are appreciated. Genitourinary: Normal external male genitalia. Rectal examination: Not performed. Extremities: In the lower extremities, there is no asymmetric swelling, erythema, tenderness, or cord formation.  No clubbing or cyanosis.  The right lower extremity is edematous from the ankle extending to the mid calf with minimal pitting. Hematologic: No petechiae, hematomas, or ecchymoses. Psychological:  He is oriented to person, place, and time; normal affect. Neurological: There are no gross neurologic deficits.  Laboratory Results:  Ref Range & Units 1d ago (12/07/17) 4wk ago (11/09/17) 1mo ago (10/06/17) 90mo ago (10/05/17)  WBC 4.0 - 10.5 K/uL 7.3  6.8 R 8.3  8.2   RBC 4.22 - 5.81 MIL/uL 4.99  4.56 R 3.95Low   4.01Low    Hemoglobin 13.0 - 17.0 g/dL 13.5  12.8Low  R 11.0Low   10.9Low    HCT 39.0 - 52.0 % 43.0  39.5 R 33.9Low   34.1Low    MCV 80.0 - 100.0 fL 86.2  86.6 R 85.8 R 85.0 R  MCH 26.0 - 34.0 pg 27.1  28.1 R 27.8  27.2   MCHC 30.0 - 36.0 g/dL 31.4  32.4 R 32.4  32.0   RDW 11.5 - 15.5 % 15.1  15.7High  R 14.2  14.3   Platelets 150 - 400 K/uL 294  256 R 264 CM 237 CM  nRBC 0.0 - 0.2 % 0.0      Comment: Performed at [facility], [place] [address], [place], [place] [address]  Neutrophils Relative %   28     Basophils Absolute   0.0 CM    Neutro Abs   1.9     Lymphocytes Relative   64     Lymphs Abs   4.4High      Monocytes Relative   4       Monocytes Absolute   0.3     Eosinophils Relative   4     Eosinophils Absolute   0.2     Basophils Relative   0     Resulting Agency  [facility]    Ref Range & Units 1d ago (12/07/17) 4wk ago (11/09/17) 16mo ago (10/06/17) 31mo ago (10/04/17) 82mo ago (10/03/17)  Sodium 135 - 145 mmol/L 143  142  136  137  137   Potassium 3.5 - 5.1 mmol/L 3.4Low   4.2  4.3  3.4Low   3.4Low    Chloride 98 - 111 mmol/L 108  105  101  104  104   CO2 22 - 32 mmol/L 24  29  24  26  24    Glucose, Bld 70 - 99 mg/dL 163High   93  170High   147High   225High    BUN 6 - 20 mg/dL 11  10  6  6  7    Creatinine, Ser 0.61 - 1.24 mg/dL 1.17  1.17  1.09  0.94  1.07   Calcium 8.9 - 10.3 mg/dL 9.1  9.6  9.0  8.1Low   7.9Low    GFR calc non Af Amer >60 mL/min >60  >60  >60  >60  >60   GFR calc Af Amer >60 mL/min >60  >60 CM >60 CM >60 CM >60 CM  Comment: (NOTE)   November 09, 2017 Serum ferritin 146 Iron/TIBC 74/311 Iron saturation 24% Haptoglobin 138 Reticulocyte count 1.3% Uric acid 9.5  Ref Range & Units 4wk ago (11/09/17) 20mo ago (10/06/17) 84mo ago (10/04/17) 53mo ago (10/03/17) 8mo ago (10/01/17)  Sodium 135 - 145 mmol/L 142  136  137  137  140   Potassium 3.5 - 5.1 mmol/L 4.2  4.3  3.4Low   3.4Low   3.4Low    Chloride 98 - 111 mmol/L 105  101  104  104  106   CO2 22 - 32 mmol/L 29  24  26  24  25    Glucose, Bld 70 - 99 mg/dL 93  170High   147High   225High   162High    BUN 6 - 20 mg/dL 10  6  6  7   5Low    Creatinine, Ser 0.61 - 1.24 mg/dL 1.17  1.09  0.94  1.07  1.15   Calcium 8.9 - 10.3 mg/dL 9.6  9.0  8.1Low   7.9Low   8.4Low    Total Protein 6.5 - 8.1 g/dL 8.5High    7.0   8.0   Albumin 3.5 - 5.0 g/dL 4.1   2.6Low    3.0Low    AST 15 - 41 U/L 29   14Low    19   ALT 0 - 44 U/L 70High    18   25   Alkaline Phosphatase 38 - 126 U/L 136High    103   129High    Total Bilirubin 0.3 - 1.2 mg/dL 0.7   0.8   0.8   GFR calc non Af Amer >60 mL/min >60  >60  >60  >60  >60   GFR calc Af  Amer >60 mL/min >60  >60 CM >60 CM >60 CM >60 CM  Comment: (NOTE)    Diagnostic/Imaging Studies: November 25, 2017 A right lower extremity duplex venous Doppler was performed. Right: Findings consistent with age indeterminate deep vein thrombosis involving the right popliteal vein. Findings consistent with age indeterminate superficial vein thrombosis involving the right small saphenous vein. A cystic structure is found in the popliteal fossa. Left: No evidence of common femoral vein obstruction.  October 04, 2017 Transthoracic Echocardiography Study Conclusions: - Left ventricle: The cavity size was normal. Systolic function was normal. The estimated ejection fraction was in the range of 55% to 60%. Wall motion was normal; there were no regional wall motion abnormalities. - Right ventricle: The cavity size was normal. Wall thickness was normal. Systolic function was normal. - Pulmonary arteries: Systolic pressure was mildly increased. PA peak pressure: 34 mmHg  Left ventricle: The cavity size was normal. Systolic function was normal. The estimated ejection fraction was in the range of 55% to  60%. Wall motion was normal; there were no regional wall motion abnormalities. The transmitral flow pattern was normal. The deceleration time of the early transmitral flow velocity was normal. The pulmonary vein flow pattern was normal.  Aortic valve: Trileaflet; normal thickness leaflets. Mobility was not restricted. Doppler: Transvalvular velocity was within the normal range. There was no stenosis. There was no regurgitation. Peak velocity ratio of LVOT to aortic valve: 0.61. Valve area (Vmax): 2.1 cm^2. Indexed valve area (Vmax): 0.95 cm^2/m^2. Peak gradient (S): 10 mm Hg.  Aorta: Aortic root: The aortic root was normal in size.  Mitral valve: Structurally normal valve. Mobility was not restricted. Doppler: Transvalvular velocity was within the  normal range. There was no evidence for stenosis. There was trivial regurgitation. Valve area by pressure half-time: 2.86 cm^2. Indexed valve area by pressure half-time: 1.29 cm^2/m^2. Peak gradient (D): 3 mm Hg.  Left atrium: The atrium was at the upper limits of normal in size.  Right ventricle: The cavity size was normal. Wall thickness was normal. Systolic function was normal.  Pulmonic valve: Poorly visualized. The valve appears to be grossly normal. Doppler: Transvalvular velocity was within the normal range. There was no evidence for stenosis.  Tricuspid valve: Structurally normal valve. Doppler: Transvalvular velocity was within the normal range. There was no regurgitation.  Pulmonary artery: The main pulmonary artery was normal-sized. Systolic pressure was mildly increased.  Right atrium: The atrium was normal in size.  Pericardium: There was no pericardial effusion.  Systemic veins: Inferior vena cava: The vessel was normal in size.  ATTENDING Ray, Nigel Sloop ADMITTING Rai, Ripudeep K ORDERING Rai, Ripudeep K REFERRING Rai, Ripudeep K PERFORMING [facility], Inpatient SONOGRAPHER Cardell Peach, RDCS  October 04, 2017 CT ABDOMEN AND PELVIS WITH CONTRAST  CONTRAST: 126mL ISOVUE-300 IOPAMIDOL (ISOVUE-300) INJECTION 61%  COMPARISON: Chest CT 10/02/2017  FINDINGS: Lower chest: Normal heart size without pericardial effusion. Mild thickening of the distal esophagus cannot exclude mild esophagitis. No small segmental or subsegmental pulmonary emboli noted on images through the lung bases though the study is not tailored toward assessment of the pulmonary arteries on this exam.  Hepatobiliary: Homogeneous attenuation of the liver. Hyperdensity along the dependent aspect of the nondistended gallbladder may represent an adjacent vessel and less likely gallstones.  Pancreas: There is peripancreatic edema more so  along the body and tail of the pancreas raising suspicion pancreatitis. A discrete mass is not identified however there is a slightly bulbous appearance to the tail of the pancreas that may simply reflect inflammatory change. No necrosis is identified. No ductal dilatation is seen. Short-term interval follow-up to assure resolution is recommended. Alternatively should findings persist, consider pancreatic protocol MRI or CT with IV contrast.  Spleen: No splenomegaly or mass.  Adrenals/Urinary Tract: Normal bilateral adrenal glands. Water attenuating cyst in the lower pole the right kidney measuring 16 mm in diameter.  Stomach/Bowel: Minimal colonic diverticulosis without acute diverticulitis. The stomach is decompressed in appearance. The duodenal bulb and sweep as well as ligament of Treitz are unremarkable. The appendix is normal as is the distal terminal ileum. No small bowel dilatation or inflammation. Moderate stool retention within the colon without inflammation.  Vascular/Lymphatic: Nonaneurysmal abdominal aorta. A few small peripancreatic mildly enlarged lymph nodes are identified measuring up to 1 cm short axis in the left upper quadrant, series 3/19 and 20 that may be reactive in etiology. Small subcentimeter mesenteric lymph nodes without pathologic enlargement. No pelvic sidewall adenopathy. No inguinal adenopathy.  Reproductive: Normal prostate and seminal vesicles.  Other: Small amount of free fluid in the pelvis. Fat containing umbilical hernia.  Musculoskeletal: No aggressive osseous lesions. Thoracolumbar spondylosis with multilevel degenerative disc disease and endplate spurring greatest at L4-5. Multilevel facet arthrosis of the lumbar spine.  IMPRESSION: 1. Peripancreatic edema and mild pancreatic tail enlargement, nonspecific but more commonly associated with pancreatitis is identified. No discrete mass is apparent. There are 1 cm short  axis peripancreatic lymph nodes in the left upper quadrant possibly reactive. Short-term interval follow-up to assure resolution is recommended. Dedicated pancreatic CT or MRI with IV contrast may also help identify occult lesions. 2. Colonic diverticulosis without acute diverticulitis. 3. Thoracolumbar spondylosis.  Ashley Royalty M.D. 10/05/2017 00:21  October 02, 2017 CT ANGIOGRAPHY CHEST WITH CONTRAST  CONTRAST: 131mL ISOVUE-370 IOPAMIDOL (ISOVUE-370) INJECTION 76%  COMPARISON: CT 06/21/2017  FINDINGS: Cardiovascular:  Heart:  No cardiomegaly. No pericardial fluid/thickening. No significant coronary calcifications. There is no measured inversion of the RV/LV ratio, with no septal inversion. No evidence of right-sided heart strain by CT imaging.  Aorta:  Unremarkable course, caliber, contour of the thoracic aorta. No aneurysm or dissection flap. No periaortic fluid.  Pulmonary arteries:  Central filling defects of the bilateral lower lobes involving segmental and subsegmental vessels. No filling defects of the lobar or the main pulmonary artery.  Mediastinum/Nodes: No mediastinal adenopathy. Unremarkable appearance of the thoracic esophagus.  Unremarkable appearance of the thoracic inlet and thyroid.  Lungs/Pleura: Central airways are clear. No pleural effusion. No confluent airspace disease.  No pneumothorax.  Upper Abdomen: Vague inflammatory changes/edema of the left upper quadrant at the hilum of the spleen and involving distal pancreas. Multiple lymph nodes are present at this location.  Musculoskeletal: Degenerative changes of the spine. No acute displaced fracture.  Review of the MIP images confirms the above findings.  IMPRESSION: CT positive for bilateral pulmonary emboli involving lower lobe segmental and subsegmental branches.  Inflammatory changes of the left upper quadrant at the hilum of the spleen and involving  distal pancreas. This may reflect acute pancreatitis, however, given the presence of pulmonary emboli, malignancy is also considered. Further evaluation with contrast-enhanced abdominal CT recommended.  These results were called by telephone at the time of interpretation on 10/02/2017 at 10:34 am to Dr. Dalia Heading , who verbally acknowledged these results.  Corrie Mckusick D.O. 10/02/2017 10:35  October 02, 2017 A duplex venous Doppler was performed of the right lower extremity. Final Interpretation: Right: Findings consistent with acute deep vein thrombosis involving the right femoral vein, and right popliteal vein. Findings consistent with age indeterminate deep vein thrombosis involving the right gastrocnemius, right peroneal vein, and right  posterior tibial vein. No cystic structure found in the popliteal fossa. Left: No evidence of common femoral vein obstruction.  Summary/Assessment: In the setting of an acute pulmonary embolism, and right lower extremity deep vein thrombosis identified at the time of CT angiography on October 02, 2017 at [facility]; having undergone a hypercoagulable evaluation; found also to be anemic over an indeterminate period; currently on rivaroxaban without complication, he presents now for the results of his laboratory evaluation and recommendations.  On October 02, 2017 he presented to the emergency department at [facility] emergency department complaining of increased swelling and pain in the right leg, worsening now over the past several months along with substernal chest pain which he reports began several days prior to his presentation.  On admission CT angiography of the chest was performed the results of which revealed bilateral pulmonary  emboli involving the lower lobe and segmental and subsegmental branches.  A 2D echocardiogram was performed on September 2, the results of which revealed no evidence of right strain or  cor pulmonale.  Those results are detailed below.    While hospitalized, he received unfractionated heparin both by bolus and continuous infusion by protocol beginning 11:00 AM on August 30.  A careful review of both his medication administration and laboratory studies suggest that a battery of laboratory studies to evaluate a hereditary/acquired thrombophilia was obtained on August 30 at 11:21 AM.  Thus it appears that he received unfractionated heparin bolus/infusion before his laboratory evaluation.   The laboratory evaluation on August 30 revealed PT/INR 15.3/1.22 PTT 28 Antithrombin III 91 protein C activity 94 protein S activity 40 (63-140%) lupus anticoagulant was affected by the presence of heparin (after heparin neutralization) it normalized; beta-2 glycoprotein 1 IgG <9; beta-2 glycoprotein 1 IgM <9; beta-2 glycoprotein 1 IgA <9; homocystine 13.7; factor V Leiden gene mutation by PCR: Negative; prothrombin gene mutation [id number]: Not identified; anticardiolipin antibodies IgG <9; anticardiolipin antibody IgA <9; anticardiolipin antibody IgM 13 (indeterminate 12-20).   Starting heparin before the vitamin K dependent coagulation studies would clearly contaminate the results of both the lupus anticoagulant and protein S activity.  After identifying heparin within the lupus anticoagulant test specimen, it was first neutralized and secondarily identified as a normal lupus anticoagulant. With protein S activity, heparin effect cannot be reversed.    Following his hospital discharge and start of treatment with rivaroxaban, his chest pain has improved significantly. He took medication at one time for gout, but thinks he has no gouty arthritis symptoms.  He has no prior blood disorder or bleeding tendency.  There is no history of blood disorder, bleeding tendency, or easy bruisability within his family.  For a lengthy period of time he has had "numbness" of the fifth digit of his right foot on a constant  basis.  He denies any numbness or tingling in his fingers.    At the time of his initial visit, we discussed in detail his recent hospitalization at [facility] and his the diagnosis of bilateral pulmonary emboli and right lower extremity deep vein thrombosis. Since starting treatment both his chest pain and right lower extremity pain have improved.  It was emphasized that it will take time before the pain and swelling subside.  With regard to his hypercoagulable work-up done while hospitalized, the very mild elevation in anticardiolipin antibody IgM (13) is not of clinical significance.  In the setting of an acute thrombotic event, frequently the anticardiolipin antibody profile is abnormal.  This very mild elevation is inconsequential.  It is recommended that it be repeated at a 12-week interval for completeness. He has been compliant with rivaroxaban.  On November 25, 2017 a repeat lower extremity venous Doppler was performed.  Those results are detailed below.  There is no evidence of clot progression within the right lower extremity.  The findings on Doppler were consistent with an age-indeterminate deep vein thrombosis involving the right popliteal vein.  A superficial vein thrombosis involving the right small saphenous vein was seen.  There is a cystic structure found in the popliteal fossa.  On the left there is no evidence of common femoral vein obstruction. These findings are consistent with a residual organized thrombus.  Because of his anemia persisted following his hospital discharge, an anemia work-up was obtained today to exclude iron deficiency and/or hemolysis.  There is no clinical or  laboratory suggestion that he has a nutritional deficiency.  On October 7, his hemoglobin was 12.8 g/dL.  Those results are detailed below. A complete blood count was obtained at that time he had a right lower extremity duplex venous Doppler. Those results are detailed below.  His  hemoglobin/hematocrit; white blood cell count; and platelets were normal.    In the interim since his last visit, his right knee/leg continues to bother him. He states also that he has degenerative joint disease involving the right knee which contributes to his pain. Both his appetite and weight remain stable.  He reports no rash or itching.  He reports no unusual headache, dizziness, lightheadedness, syncope, or near syncopal episodes.  He denies any new visual changes or hearing deficit.  He has no unusual cough, sore throat, or orthopnea.  He has no pain or difficulty in swallowing.  He denies dyspnea either at rest or on minimal exertion. No fever, shaking chills, sweats, or flulike symptoms are reported.  With medication, he has no heartburn or indigestion.  There has infrequent nausea without vomiting, now improved with pantoprazole. He denies diarrhea or constipation.  He moves his bowels twice daily.  He denies melena or bright red blood per rectum.  No urinary frequency, urgency, hematuria, or dysuria are evident. He continues to have very mild swelling of the right ankle extending to the mid calf along with tenderness above the knee, but steadily improving.    Recommendation/Plan: We reviewed in detail the results of his most recent duplex venous Doppler of the right lower extremity.  While there does appear to be persistent organized thrombus, it is dramatically less extensive than previously noted on August 30.  He has some degree of postphlebitic pain syndrome.  Those results are detailed above.  It was recommended that he avoid prolonged periods of standing and/or sitting.  Should he go on a long automobile or airplane trips, he was advised to move about as much as possible every 2 hours or less. Occasionally heat treatments are helpful.  While indoors, it was recommended that he keep his feet elevated.  If his pain is intolerable, a trial of nonsteroidal anti-inflammatories can be tried.  Some  individuals find benefit from support stockings.  There is no consistent data to support their use in preventing recurrent thrombosis.  He was reassured that the direct oral anticoagulant is working, and that it is likely that he will continue to improve with regard to the pain syndrome.  He was advised to notify us or Dr. Margarita Rana If the leg swelling or pain worsens.  His postphlebitic pain syndrome is slowly improving.  If he experiences a setback, then a vascular surgical consultation may be helpful.  It is much too late to consider thrombolytic therapy.  Because of the findings identified on CT imaging of the abdomen and pelvis from August 30 while hospitalized, I would consider a repeat dedicated CT scan of the pancreas and pancreatic bed to confirm its resolution.  That study if felt appropriate, should be generated through his primary care physician Dr. Margarita Rana.  With regard to his previous anemia, this has resolved without intervention.  There is no evidence of iron deficiency or hemolysis.   As mentioned before, I would consider a repeat antiphospholipid antibody profile and factor VIII at the time of his next visit.  For now he is scheduled for a return visit on January 21 after nearly 6 months on anticoagulation therapy before making a decision regarding long-term anticoagulation.  At present, because of his continued pain in the right lower extremity, although improving, I would not discontinue rivaroxaban.  A reevaluation in 6 months from the onset of his VTE is reasonable.  The total time spent discussing the results of his initial laboratory studies, recent right lower extremity duplex venous Doppler and its implications, and recommendations was 25 minutes. At least 50% of that time was spent in face-to-face discussion, counseling, and answering questions.  This note was dictated using voice activated technology/software.  Unfortunately, typographical errors are not uncommon, and transcription  is subject to mistakes and regrettably misinterpretation.  If necessary, clarification of the above information can be discussed with me at any time.  FOLLOW UP: AS DIRECTED   cc:    [name] MD   Henreitta Leber, MD  Hematology/Oncology [place] [address] [place], [place] [address] Office: [phone] [facility]: [phone]

## 2017-12-10 ENCOUNTER — Encounter: Payer: Self-pay | Admitting: Family Medicine

## 2017-12-10 ENCOUNTER — Ambulatory Visit: Payer: Self-pay | Attending: Family Medicine | Admitting: Family Medicine

## 2017-12-10 VITALS — BP 130/80 | HR 75 | Temp 97.7°F | Ht 69.0 in | Wt 229.0 lb

## 2017-12-10 DIAGNOSIS — Z7984 Long term (current) use of oral hypoglycemic drugs: Secondary | ICD-10-CM | POA: Insufficient documentation

## 2017-12-10 DIAGNOSIS — Z86718 Personal history of other venous thrombosis and embolism: Secondary | ICD-10-CM | POA: Insufficient documentation

## 2017-12-10 DIAGNOSIS — E785 Hyperlipidemia, unspecified: Secondary | ICD-10-CM | POA: Insufficient documentation

## 2017-12-10 DIAGNOSIS — M1711 Unilateral primary osteoarthritis, right knee: Secondary | ICD-10-CM | POA: Insufficient documentation

## 2017-12-10 DIAGNOSIS — Z7901 Long term (current) use of anticoagulants: Secondary | ICD-10-CM | POA: Insufficient documentation

## 2017-12-10 DIAGNOSIS — I824Z1 Acute embolism and thrombosis of unspecified deep veins of right distal lower extremity: Secondary | ICD-10-CM

## 2017-12-10 DIAGNOSIS — M109 Gout, unspecified: Secondary | ICD-10-CM | POA: Insufficient documentation

## 2017-12-10 DIAGNOSIS — E119 Type 2 diabetes mellitus without complications: Secondary | ICD-10-CM | POA: Insufficient documentation

## 2017-12-10 DIAGNOSIS — Z79899 Other long term (current) drug therapy: Secondary | ICD-10-CM | POA: Insufficient documentation

## 2017-12-10 DIAGNOSIS — I2699 Other pulmonary embolism without acute cor pulmonale: Secondary | ICD-10-CM | POA: Insufficient documentation

## 2017-12-10 LAB — GLUCOSE, POCT (MANUAL RESULT ENTRY): POC Glucose: 229 mg/dl — AB (ref 70–99)

## 2017-12-10 LAB — POCT GLYCOSYLATED HEMOGLOBIN (HGB A1C): HbA1c, POC (controlled diabetic range): 6.5 % (ref 0.0–7.0)

## 2017-12-10 MED ORDER — TRAMADOL HCL 50 MG PO TABS: 50.0000 mg | ORAL_TABLET | Freq: Two times a day (BID) | ORAL | 1 refills | Status: DC | PRN

## 2017-12-10 MED FILL — traMADol HCL 50 MG TABS: 50 | 20 days supply | Qty: 40 | Fill #0

## 2017-12-10 NOTE — Progress Notes (Signed)
Subjective:  Patient ID: Terry Poole, male    DOB: 1965/07/15  Age: 52 y.o. MRN: [id number]  CC: Diabetes   HPI Trigger Frasier is a 52 year old male with a history of type 2 diabetes mellitus (A1c 6.5) Hyperlipidemia, Gout, bilateral PE and right lower extremity DVT who presents today requesting a letter for work.  He would like to letter stating he is unable to maintain his second job until further notice as he works at both [employer] but has been out due to his right lower extremity DVT and PE. He remains on anticoagulation with Xarelto and at his last visit with oncology 2 days ago the plan was to continue Xarelto until his appointment next year at which time a decision would be made as to the duration of anticoagulation and also hypercoagulable labs would be sent off.  Pain and swelling in his right lower extremity has decreased significantly but he continues to have right knee pain and x-ray  from last month revealed moderate tricompartmental degenerative joint disease.  He has run out of tramadol which he received from the ED for knee pain.  Past Medical History:  Diagnosis Date  . Bronchitis   . Bronchitis   . DVT (deep venous thrombosis) ([place])   . Gout   . Hammertoe of second toe of right foot   . Smoker     Past Surgical History:  Procedure Laterality Date  . HAMMER TOE SURGERY Right 03/10/2016   Procedure: 2ND RIGHT HAMMER TOE CORRECTION;  Surgeon: Landis Martins, DPM;  Location: [place];  Service: Podiatry;  Laterality: Right;  . MASS EXCISION Right 03/10/2016   Procedure: EXCISION OF CALLUS 2ND RIGHT TOE;  Surgeon: Landis Martins, DPM;  Location: [place];  Service: Podiatry;  Laterality: Right;  . NO PAST SURGERIES      Allergies  Allergen Reactions  . Vicodin [Hydrocodone-Acetaminophen] Hives    Patient stated he is no longer allergic to vicodin, patient stated that he was allergic about 12 years ago.      Outpatient  Medications Prior to Visit  Medication Sig Dispense Refill  . cetirizine (ZYRTEC) 10 MG tablet Take 1 tablet (10 mg total) by mouth daily. 30 tablet 1  . lidocaine (LIDODERM) 5 % Place 1 patch onto the skin daily. Remove & Discard patch within 12 hours or as directed by MD 30 patch 0  . pantoprazole (PROTONIX) 40 MG tablet Take 1 tablet (40 mg total) by mouth 2 (two) times daily. (Patient taking differently: Take 40 mg by mouth daily. ) 60 tablet 3  . rivaroxaban (XARELTO) 20 MG TABS tablet Take 1 tablet (20 mg total) by mouth daily with supper. 30 tablet 2  . Sildenafil Citrate (VIAGRA PO) 50 mg.  1  . traMADol (ULTRAM) 50 MG tablet Take 1 tablet (50 mg total) by mouth every 6 (six) hours as needed. 8 tablet 0  . amoxicillin (AMOXIL) 500 MG capsule Take 1 capsule (500 mg total) by mouth 3 (three) times daily. (Patient not taking: Reported on 12/10/2017) 30 capsule 0  . metFORMIN (GLUCOPHAGE) 500 MG tablet Take 1 tablet (500 mg total) by mouth daily with breakfast. (Patient not taking: Reported on 11/24/2017) 30 tablet 3  . predniSONE (DELTASONE) 20 MG tablet Take 1 tablet (20 mg total) by mouth daily with breakfast. (Patient not taking: Reported on 12/10/2017) 5 tablet 0   No facility-administered medications prior to visit.     ROS Review of Systems  Constitutional: Negative for activity change and appetite change.  HENT: Negative for sinus pressure and sore throat.   Eyes: Negative for visual disturbance.  Respiratory: Negative for cough, chest tightness and shortness of breath.   Cardiovascular: Negative for chest pain and leg swelling.  Gastrointestinal: Negative for abdominal distention, abdominal pain, constipation and diarrhea.  Endocrine: Negative.   Genitourinary: Negative for dysuria.  Musculoskeletal:       See hpi  Skin: Negative for rash.  Allergic/Immunologic: Negative.   Neurological: Negative for weakness, light-headedness and numbness.  Psychiatric/Behavioral: Negative  for dysphoric mood and suicidal ideas.    Objective:  BP 130/80   Pulse 75   Temp 97.7 F (36.5 C) (Oral)   Ht 5\' 9"  (1.753 m)   Wt 229 lb (103.9 kg)   SpO2 100%   BMI 33.82 kg/m   BP/Weight 12/10/2017 12/08/2017 34/08/4257  Systolic BP 563 875 643  Diastolic BP 80 329 518  Wt. (Lbs) 229 228.8 -  BMI 33.82 33.79 -      Physical Exam  Constitutional: He is oriented to person, place, and time. He appears well-developed and well-nourished.  Cardiovascular: Normal rate, normal heart sounds and intact distal pulses.  No murmur heard. Pulmonary/Chest: Effort normal and breath sounds normal. He has no wheezes. He has no rales. He exhibits no tenderness.  Abdominal: Soft. Bowel sounds are normal. He exhibits no distension and no mass. There is no tenderness.  Musculoskeletal:  Slight right knee edema with associated crepitus and tenderness on range of motion  Neurological: He is alert and oriented to person, place, and time.     Lab Results  Component Value Date   HGBA1C 6.5 12/10/2017    Assessment & Plan:   1. Type 2 diabetes mellitus without complication, without long-term current use of insulin (HCC) Controlled with A1c of 6.5 Prescribed metformin which she is adamant about not taking Continue diabetic diet and lifestyle modifications - POCT glucose (manual entry) - POCT glycosylated hemoglobin (Hb A1C)  2. Acute pulmonary embolism without acute cor pulmonale, unspecified pulmonary embolism type (HCC) Continue Xarelto Duration of anticoagulation to be determined at next visit with hematology next year  3. Acute deep vein thrombosis (DVT) of distal vein of right lower extremity (HCC) Currently on Xarelto Pain and swelling have improved I have provided his letter as requested  4. Primary osteoarthritis of right knee Stable He will benefit from cortisone injections but I will hold off on referring him at this time given he is on Xarelto Advised to apply ice, use  knee brace - traMADol (ULTRAM) 50 MG tablet; Take 1 tablet (50 mg total) by mouth every 12 (twelve) hours as needed.  Dispense: 40 tablet; Refill: 1   Meds ordered this encounter  Medications  . traMADol (ULTRAM) 50 MG tablet    Sig: Take 1 tablet (50 mg total) by mouth every 12 (twelve) hours as needed.    Dispense:  40 tablet    Refill:  1    Follow-up: Return in about 3 months (around 03/12/2018) for Follow-up of chronic medical conditions.   [name] MD

## 2017-12-24 ENCOUNTER — Ambulatory Visit: Payer: Self-pay | Attending: Family Medicine | Admitting: Family Medicine

## 2017-12-24 ENCOUNTER — Encounter: Payer: Self-pay | Admitting: Family Medicine

## 2017-12-24 VITALS — BP 147/95 | HR 74 | Temp 97.6°F | Ht 69.0 in | Wt 226.4 lb

## 2017-12-24 DIAGNOSIS — I824Z1 Acute embolism and thrombosis of unspecified deep veins of right distal lower extremity: Secondary | ICD-10-CM | POA: Insufficient documentation

## 2017-12-24 DIAGNOSIS — Z885 Allergy status to narcotic agent status: Secondary | ICD-10-CM | POA: Insufficient documentation

## 2017-12-24 DIAGNOSIS — Z9889 Other specified postprocedural states: Secondary | ICD-10-CM | POA: Insufficient documentation

## 2017-12-24 DIAGNOSIS — E119 Type 2 diabetes mellitus without complications: Secondary | ICD-10-CM | POA: Insufficient documentation

## 2017-12-24 DIAGNOSIS — M1711 Unilateral primary osteoarthritis, right knee: Secondary | ICD-10-CM | POA: Insufficient documentation

## 2017-12-24 DIAGNOSIS — F17200 Nicotine dependence, unspecified, uncomplicated: Secondary | ICD-10-CM | POA: Insufficient documentation

## 2017-12-24 DIAGNOSIS — I10 Essential (primary) hypertension: Secondary | ICD-10-CM | POA: Insufficient documentation

## 2017-12-24 LAB — GLUCOSE, POCT (MANUAL RESULT ENTRY): POC GLUCOSE: 109 mg/dL — AB (ref 70–99)

## 2017-12-24 MED ORDER — LISINOPRIL 5 MG PO TABS: 5.0000 mg | ORAL_TABLET | Freq: Every day | ORAL | 3 refills | Status: DC

## 2017-12-24 MED FILL — LISINOPRIL 5 MG TAB: 5 | 30 days supply | Qty: 30 | Fill #0

## 2017-12-24 MED FILL — XARELTO 20 MG TABLET: 20 | 30 days supply | Qty: 30 | Fill #0

## 2017-12-24 NOTE — Progress Notes (Signed)
Patient would like a script for a knee brace.

## 2017-12-24 NOTE — Progress Notes (Signed)
Subjective:  Patient ID: Vinicio Lynk, male    DOB: 1965-09-19  Age: 52 y.o. MRN: [id number]  CC: Hospitalization Follow-up   HPI Javonn Gauger  is a 52 year old male with a history of type 2 diabetes mellitus (A1c 6.5) Hyperlipidemia, Gout, bilateral PE and right lower extremity DVT who presents today requesting a letter for work.  At his last visit he had stated he would like to letter stating he is unable to maintain his second job until further notice as he works at both [employer] but has been out due to his right lower extremity DVT and PE. Today he informs me he would like to return to his second job at [employer] due to financial reasons.  His right lower extremity edema has improved significantly and his knee pain is minimal and controlled on Tylenol 3. He would like a letter to return to work and is also requesting a right knee brace.  His blood pressure is elevated and he is currently not on any antihypertensives.  His blood pressures have been elevated in the past and today he is resisting initiation of an antihypertensive.  Past Medical History:  Diagnosis Date  . Bronchitis   . Bronchitis   . DVT (deep venous thrombosis) ([place])   . Gout   . Hammertoe of second toe of right foot   . Smoker     Past Surgical History:  Procedure Laterality Date  . HAMMER TOE SURGERY Right 03/10/2016   Procedure: 2ND RIGHT HAMMER TOE CORRECTION;  Surgeon: Landis Martins, DPM;  Location: [place];  Service: Podiatry;  Laterality: Right;  . MASS EXCISION Right 03/10/2016   Procedure: EXCISION OF CALLUS 2ND RIGHT TOE;  Surgeon: Landis Martins, DPM;  Location: [place];  Service: Podiatry;  Laterality: Right;  . NO PAST SURGERIES      Allergies  Allergen Reactions  . Vicodin [Hydrocodone-Acetaminophen] Hives    Patient stated he is no longer allergic to vicodin, patient stated that he was allergic about 12 years ago.      Outpatient Medications Prior  to Visit  Medication Sig Dispense Refill  . cetirizine (ZYRTEC) 10 MG tablet Take 1 tablet (10 mg total) by mouth daily. 30 tablet 1  . lidocaine (LIDODERM) 5 % Place 1 patch onto the skin daily. Remove & Discard patch within 12 hours or as directed by MD 30 patch 0  . pantoprazole (PROTONIX) 40 MG tablet Take 1 tablet (40 mg total) by mouth 2 (two) times daily. (Patient taking differently: Take 40 mg by mouth daily. ) 60 tablet 3  . rivaroxaban (XARELTO) 20 MG TABS tablet Take 1 tablet (20 mg total) by mouth daily with supper. 30 tablet 2  . Sildenafil Citrate (VIAGRA PO) 50 mg.  1  . traMADol (ULTRAM) 50 MG tablet Take 1 tablet (50 mg total) by mouth every 12 (twelve) hours as needed. 40 tablet 1  . amoxicillin (AMOXIL) 500 MG capsule Take 1 capsule (500 mg total) by mouth 3 (three) times daily. (Patient not taking: Reported on 12/10/2017) 30 capsule 0  . metFORMIN (GLUCOPHAGE) 500 MG tablet Take 1 tablet (500 mg total) by mouth daily with breakfast. (Patient not taking: Reported on 11/24/2017) 30 tablet 3   No facility-administered medications prior to visit.     ROS Review of Systems  Constitutional: Negative for activity change and appetite change.  HENT: Negative for sinus pressure and sore throat.   Eyes: Negative for visual disturbance.  Respiratory:  Negative for cough, chest tightness and shortness of breath.   Cardiovascular: Negative for chest pain and leg swelling.  Gastrointestinal: Negative for abdominal distention, abdominal pain, constipation and diarrhea.  Endocrine: Negative.   Genitourinary: Negative for dysuria.  Musculoskeletal: Negative for joint swelling and myalgias.  Skin: Negative for rash.  Allergic/Immunologic: Negative.   Neurological: Negative for weakness, light-headedness and numbness.  Psychiatric/Behavioral: Negative for dysphoric mood and suicidal ideas.    Objective:  BP (!) 147/95   Pulse 74   Temp 97.6 F (36.4 C) (Oral)   Ht 5\' 9"  (1.753 m)    Wt 226 lb 6.4 oz (102.7 kg)   SpO2 98%   BMI 33.43 kg/m   BP/Weight 12/24/2017 12/10/2017 83/03/5496  Systolic BP 264 158 309  Diastolic BP 95 80 407  Wt. (Lbs) 226.4 229 228.8  BMI 33.43 33.82 33.79      Physical Exam  Constitutional: He is oriented to person, place, and time. He appears well-developed and well-nourished.  Cardiovascular: Normal rate, normal heart sounds and intact distal pulses.  No murmur heard. Pulmonary/Chest: Effort normal and breath sounds normal. He has no wheezes. He has no rales. He exhibits no tenderness.  Abdominal: Soft. Bowel sounds are normal. He exhibits no distension and no mass. There is no tenderness.  Musculoskeletal: Normal range of motion. He exhibits no edema.  Normal appearance of both knees Crepitus and range of motion of both knees bilaterally No tenderness elicited  Neurological: He is alert and oriented to person, place, and time.    Lab Results  Component Value Date   HGBA1C 6.5 12/10/2017    Assessment & Plan:   1. Type 2 diabetes mellitus without complication, without long-term current use of insulin (HCC) Controlled with A1c of 6.5 Advised to continue metformin even though he declines and would like to remain dietary modification - POCT glucose (manual entry)  2. Essential hypertension Blood pressure has been elevated at several visits Commence lisinopril Counseled on blood pressure goal of less than 130/80, low-sodium, DASH diet, medication compliance, 150 minutes of moderate intensity exercise per week. Discussed medication compliance, adverse effects. - lisinopril (PRINIVIL,ZESTRIL) 5 MG tablet; Take 1 tablet (5 mg total) by mouth daily.  Dispense: 30 tablet; Refill: 3  3. Primary osteoarthritis of right knee Pain is minimal at this time Controlled on Tylenol - Knee brace  4. Acute deep vein thrombosis (DVT) of distal vein of right lower extremity (HCC) Left lower extremity edema has resolved He is of the opinion  that he is able to resume his second job and I have provided him a letter to this effect.   Meds ordered this encounter  Medications  . lisinopril (PRINIVIL,ZESTRIL) 5 MG tablet    Sig: Take 1 tablet (5 mg total) by mouth daily.    Dispense:  30 tablet    Refill:  3    Follow-up: Return for Up of chronic medical conditions, keep previously scheduled appointment.   [name] MD

## 2017-12-25 ENCOUNTER — Encounter: Payer: Self-pay | Admitting: Family Medicine

## 2017-12-28 ENCOUNTER — Encounter

## 2017-12-28 ENCOUNTER — Ambulatory Visit: Payer: Self-pay | Admitting: Family Medicine

## 2017-12-29 MED FILL — ALLOPURINOL 300 MG TAB: 300 | 30 days supply | Qty: 60 | Fill #2

## 2018-01-04 MED FILL — PANTOPRAZOLE SOD DR 40 MG T: 40 | 30 days supply | Qty: 60 | Fill #2

## 2018-01-19 MED FILL — $Viagra 50mg tablet: 50 | 90 days supply | Qty: 30 | Fill #0

## 2018-01-22 MED FILL — XARELTO 20 MG TABLET: 20 | 30 days supply | Qty: 30 | Fill #0

## 2018-01-29 ENCOUNTER — Ambulatory Visit: Payer: Self-pay | Attending: Family Medicine | Admitting: Physician Assistant

## 2018-01-29 VITALS — BP 147/93 | HR 78 | Temp 98.2°F | Resp 16 | Wt 221.0 lb

## 2018-01-29 DIAGNOSIS — E119 Type 2 diabetes mellitus without complications: Secondary | ICD-10-CM

## 2018-01-29 DIAGNOSIS — Z885 Allergy status to narcotic agent status: Secondary | ICD-10-CM | POA: Insufficient documentation

## 2018-01-29 DIAGNOSIS — I10 Essential (primary) hypertension: Secondary | ICD-10-CM

## 2018-01-29 DIAGNOSIS — Z86718 Personal history of other venous thrombosis and embolism: Secondary | ICD-10-CM | POA: Insufficient documentation

## 2018-01-29 DIAGNOSIS — J329 Chronic sinusitis, unspecified: Secondary | ICD-10-CM

## 2018-01-29 DIAGNOSIS — Z7984 Long term (current) use of oral hypoglycemic drugs: Secondary | ICD-10-CM | POA: Insufficient documentation

## 2018-01-29 DIAGNOSIS — Z7901 Long term (current) use of anticoagulants: Secondary | ICD-10-CM | POA: Insufficient documentation

## 2018-01-29 DIAGNOSIS — B9689 Other specified bacterial agents as the cause of diseases classified elsewhere: Secondary | ICD-10-CM | POA: Insufficient documentation

## 2018-01-29 DIAGNOSIS — F17200 Nicotine dependence, unspecified, uncomplicated: Secondary | ICD-10-CM | POA: Insufficient documentation

## 2018-01-29 DIAGNOSIS — Z79899 Other long term (current) drug therapy: Secondary | ICD-10-CM | POA: Insufficient documentation

## 2018-01-29 LAB — GLUCOSE, POCT (MANUAL RESULT ENTRY): POC Glucose: 123 mg/dL — AB (ref 70–99)

## 2018-01-29 MED ORDER — LISINOPRIL 5 MG PO TABS: 5.0000 mg | ORAL_TABLET | Freq: Every day | ORAL | 3 refills | Status: DC

## 2018-01-29 MED ORDER — FLUTICASONE PROPIONATE 50 MCG/ACT NA SUSP: 2.0000 | Freq: Every day | NASAL | 6 refills | Status: DC

## 2018-01-29 MED ORDER — BENZONATATE 100 MG PO CAPS: 200.0000 mg | ORAL_CAPSULE | Freq: Three times a day (TID) | ORAL | 0 refills | Status: DC | PRN

## 2018-01-29 MED ORDER — METFORMIN HCL 500 MG PO TABS: 500.0000 mg | ORAL_TABLET | Freq: Every day | ORAL | 3 refills | Status: DC

## 2018-01-29 MED ORDER — PANTOPRAZOLE SODIUM 40 MG PO TBEC: 40.0000 mg | DELAYED_RELEASE_TABLET | Freq: Two times a day (BID) | ORAL | 3 refills | Status: DC

## 2018-01-29 MED ORDER — AMOXICILLIN 500 MG PO CAPS: 1000.0000 mg | ORAL_CAPSULE | Freq: Two times a day (BID) | ORAL | 0 refills | Status: DC

## 2018-01-29 MED FILL — LISINOPRIL 5 MG TAB: 5 | 30 days supply | Qty: 30 | Fill #0

## 2018-01-29 MED FILL — metFORMIN HCL 500 MG TABS: 500 | 30 days supply | Qty: 30 | Fill #0

## 2018-01-29 MED FILL — AMOXICILLIN 500 MG CAPSULE: 500 | 10 days supply | Qty: 40 | Fill #0

## 2018-01-29 MED FILL — FLUTICASONE PROP 50 MCG SPR: 50 | 30 days supply | Qty: 16 | Fill #0

## 2018-01-29 MED FILL — BENZONATATE 100 MG CAP: 100 | 6 days supply | Qty: 40 | Fill #0

## 2018-01-29 NOTE — Progress Notes (Signed)
Sinus drainage Cough- clear phlegm Sx's onset x 2 weeks

## 2018-01-29 NOTE — Progress Notes (Signed)
Patient ID: Terry Poole, male   DOB: 01-21-1966, 52 y.o.   MRN: [id number]   Terry Poole, is a 52 y.o. male  ZYS:[id number]  [id number]  DOB - 11-06-1965  Subjective:  Chief Complaint and HPI: Terry Poole is a 52 y.o. male here today with 1.5 week h/o sinus congestion and now worsening sinus pain.  He has had some cough from post-nasal drip.  He denies fever.  Mucus he blows from nose is yellowish and thick.    He has been out of BP meds and metformin for a week.    ROS:   Constitutional:  No f/c, No night sweats, No unexplained weight loss. EENT:  No vision changes, No blurry vision, No hearing changes.  Respiratory: No cough, No SOB Cardiac: No CP, no palpitations GI:  No abd pain, No N/V/D. GU: No Urinary s/sx Musculoskeletal: No joint pain Neuro: No headache, no dizziness, no motor weakness.  Skin: No rash Endocrine:  No polydipsia. No polyuria.  Psych: Denies SI/HI  No problems updated.  ALLERGIES: Allergies  Allergen Reactions  . Vicodin [Hydrocodone-Acetaminophen] Hives    Patient stated he is no longer allergic to vicodin, patient stated that he was allergic about 12 years ago.     PAST MEDICAL HISTORY: Past Medical History:  Diagnosis Date  . Bronchitis   . Bronchitis   . DVT (deep venous thrombosis) ([place])   . Gout   . Hammertoe of second toe of right foot   . Smoker     MEDICATIONS AT HOME: Prior to Admission medications   Medication Sig Start Date End Date Taking? Authorizing Provider  rivaroxaban (XARELTO) 20 MG TABS tablet Take 1 tablet (20 mg total) by mouth daily with supper. 10/22/17  Yes [name], MD  amoxicillin (AMOXIL) 500 MG capsule Take 2 capsules (1,000 mg total) by mouth 2 (two) times daily. 01/29/18   Argentina Donovan, PA-C  benzonatate (TESSALON) 100 MG capsule Take 2 capsules (200 mg total) by mouth 3 (three) times daily as needed for cough. 01/29/18   Argentina Donovan, PA-C  cetirizine (ZYRTEC) 10 MG tablet Take 1 tablet  (10 mg total) by mouth daily. Patient not taking: Reported on 01/29/2018 11/24/17   [name], MD  fluticasone (FLONASE) 50 MCG/ACT nasal spray Place 2 sprays into both nostrils daily. 01/29/18   Argentina Donovan, PA-C  lisinopril (PRINIVIL,ZESTRIL) 5 MG tablet Take 1 tablet (5 mg total) by mouth daily. 01/29/18   Argentina Donovan, PA-C  metFORMIN (GLUCOPHAGE) 500 MG tablet Take 1 tablet (500 mg total) by mouth daily with breakfast. 01/29/18   Argentina Donovan, PA-C  pantoprazole (PROTONIX) 40 MG tablet Take 1 tablet (40 mg total) by mouth 2 (two) times daily. 01/29/18   Argentina Donovan, PA-C  Sildenafil Citrate (VIAGRA PO) 50 mg. 11/02/17   [provider]  traMADol (ULTRAM) 50 MG tablet Take 1 tablet (50 mg total) by mouth every 12 (twelve) hours as needed. Patient not taking: Reported on 01/29/2018 12/10/17   [name], MD     Objective:  EXAM:   Vitals:   01/29/18 1019  BP: (!) 147/93  Pulse: 78  Resp: 16  Temp: 98.2 F (36.8 C)  TempSrc: Oral  SpO2: 98%  Weight: 221 lb (100.2 kg)    General appearance : A&OX3. NAD. Non-toxic-appearing HEENT: Atraumatic and Normocephalic.  PERRLA. EOM intact.  TM with cerumen B.  TM barely visible but no infection.   Mouth-MMM, post pharynx WNL w/o erythema, +  PND.  B maxillary sinus TTP Neck: supple, no JVD. No cervical lymphadenopathy. No thyromegaly Chest/Lungs:  Breathing-non-labored, Good air entry bilaterally, breath sounds normal without rales, rhonchi, or wheezing  CVS: S1 S2 regular, no murmurs, gallops, rubs  Extremities: Bilateral Lower Ext shows no edema, both legs are warm to touch with = pulse throughout Neurology:  CN II-XII grossly intact, Non focal.   Psych:  TP linear. J/I WNL. Normal speech. Appropriate eye contact and affect.  Skin:  No Rash  Data Review Lab Results  Component Value Date   HGBA1C 6.5 12/10/2017   HGBA1C 6.5 (A) 09/24/2017   HGBA1C 6.5 09/24/2017   HGBA1C 6.5 (A) 09/24/2017    HGBA1C 6.5 09/24/2017     Assessment & Plan   1. Sinusitis, unspecified chronicity, unspecified location Fluids, rest, respiratory care - amoxicillin (AMOXIL) 500 MG capsule; Take 2 capsules (1,000 mg total) by mouth 2 (two) times daily.  Dispense: 40 capsule; Refill: 0 - benzonatate (TESSALON) 100 MG capsule; Take 2 capsules (200 mg total) by mouth 3 (three) times daily as needed for cough.  Dispense: 40 capsule; Refill: 0 - fluticasone (FLONASE) 50 MCG/ACT nasal spray; Place 2 sprays into both nostrils daily.  Dispense: 16 g; Refill: 6  2. Type 2 diabetes mellitus without complication, without long-term current use of insulin (HCC) Controlled-continue - Glucose (CBG) - metFORMIN (GLUCOPHAGE) 500 MG tablet; Take 1 tablet (500 mg total) by mouth daily with breakfast.  Dispense: 30 tablet; Refill: 3  3. Essential hypertension Has been off meds for > 1 week, so, not controlled- - lisinopril (PRINIVIL,ZESTRIL) 5 MG tablet; Take 1 tablet (5 mg total) by mouth daily.  Dispense: 30 tablet; Refill: 3   Patient have been counseled extensively about nutrition and exercise  Return for for 03/16/2018 appt with Dr Margarita Rana.  The patient was given clear instructions to go to ER or return to medical center if symptoms don't improve, worsen or new problems develop. The patient verbalized understanding. The patient was told to call to get lab results if they haven't heard anything in the next week.     Freeman Caldron, PA-C [facility] [place], [place]   01/29/2018, 10:31 AM

## 2018-02-08 MED FILL — ALLOPURINOL 300 MG TAB: 300 | 30 days supply | Qty: 60 | Fill #3

## 2018-02-09 MED FILL — metFORMIN HCL 500 MG TABS: 500 | 30 days supply | Qty: 30 | Fill #0

## 2018-02-10 ENCOUNTER — Telehealth: Payer: Self-pay | Admitting: Internal Medicine

## 2018-02-10 NOTE — Telephone Encounter (Signed)
RR out 1/21 moved [facility] 1/24. Left message schedule mailed.

## 2018-02-11 ENCOUNTER — Telehealth: Payer: Self-pay | Admitting: Internal Medicine

## 2018-02-11 NOTE — Telephone Encounter (Signed)
R/s appt per 1/9 sch message - left message for patient with appt date and time

## 2018-02-16 MED FILL — BENZONATATE 100 MG CAP: 100 | 6 days supply | Qty: 40 | Fill #0

## 2018-02-16 MED FILL — LISINOPRIL 5 MG TAB: 5 | 30 days supply | Qty: 30 | Fill #0

## 2018-02-19 MED FILL — XARELTO 20 MG TABLET: 20 | 30 days supply | Qty: 30 | Fill #1

## 2018-02-23 ENCOUNTER — Ambulatory Visit: Payer: Self-pay | Admitting: Hematology and Oncology

## 2018-02-23 ENCOUNTER — Other Ambulatory Visit: Payer: Self-pay

## 2018-02-26 ENCOUNTER — Inpatient Hospital Stay: Payer: BLUE CROSS/BLUE SHIELD | Attending: Hematology and Oncology

## 2018-02-26 ENCOUNTER — Ambulatory Visit: Payer: Self-pay | Admitting: Internal Medicine

## 2018-02-26 ENCOUNTER — Telehealth: Payer: Self-pay | Admitting: Internal Medicine

## 2018-02-26 ENCOUNTER — Other Ambulatory Visit: Payer: Self-pay

## 2018-02-26 ENCOUNTER — Other Ambulatory Visit: Payer: Self-pay | Admitting: *Deleted

## 2018-02-26 ENCOUNTER — Inpatient Hospital Stay (HOSPITAL_BASED_OUTPATIENT_CLINIC_OR_DEPARTMENT_OTHER): Payer: BLUE CROSS/BLUE SHIELD | Admitting: Internal Medicine

## 2018-02-26 VITALS — BP 133/85 | HR 88 | Temp 98.2°F | Resp 18 | Ht 69.0 in | Wt 228.3 lb

## 2018-02-26 DIAGNOSIS — I824Z1 Acute embolism and thrombosis of unspecified deep veins of right distal lower extremity: Secondary | ICD-10-CM

## 2018-02-26 DIAGNOSIS — Z7901 Long term (current) use of anticoagulants: Secondary | ICD-10-CM | POA: Diagnosis not present

## 2018-02-26 DIAGNOSIS — Z86718 Personal history of other venous thrombosis and embolism: Secondary | ICD-10-CM

## 2018-02-26 DIAGNOSIS — Z86711 Personal history of pulmonary embolism: Secondary | ICD-10-CM | POA: Insufficient documentation

## 2018-02-26 DIAGNOSIS — E119 Type 2 diabetes mellitus without complications: Secondary | ICD-10-CM

## 2018-02-26 DIAGNOSIS — F17210 Nicotine dependence, cigarettes, uncomplicated: Secondary | ICD-10-CM | POA: Diagnosis not present

## 2018-02-26 DIAGNOSIS — I2699 Other pulmonary embolism without acute cor pulmonale: Secondary | ICD-10-CM

## 2018-02-26 DIAGNOSIS — Z79899 Other long term (current) drug therapy: Secondary | ICD-10-CM

## 2018-02-26 DIAGNOSIS — Z7984 Long term (current) use of oral hypoglycemic drugs: Secondary | ICD-10-CM | POA: Insufficient documentation

## 2018-02-26 DIAGNOSIS — I10 Essential (primary) hypertension: Secondary | ICD-10-CM

## 2018-02-26 LAB — CBC WITH DIFFERENTIAL (CANCER CENTER ONLY)
Abs Immature Granulocytes: 0.01 10*3/uL (ref 0.00–0.07)
BASOS PCT: 1 %
Basophils Absolute: 0 10*3/uL (ref 0.0–0.1)
EOS PCT: 3 %
Eosinophils Absolute: 0.2 10*3/uL (ref 0.0–0.5)
HCT: 40.5 % (ref 39.0–52.0)
Hemoglobin: 12.9 g/dL — ABNORMAL LOW (ref 13.0–17.0)
Immature Granulocytes: 0 %
Lymphocytes Relative: 47 %
Lymphs Abs: 3.6 10*3/uL (ref 0.7–4.0)
MCH: 27.9 pg (ref 26.0–34.0)
MCHC: 31.9 g/dL (ref 30.0–36.0)
MCV: 87.5 fL (ref 80.0–100.0)
Monocytes Absolute: 0.4 10*3/uL (ref 0.1–1.0)
Monocytes Relative: 5 %
Neutro Abs: 3.3 10*3/uL (ref 1.7–7.7)
Neutrophils Relative %: 44 %
PLATELETS: 247 10*3/uL (ref 150–400)
RBC: 4.63 MIL/uL (ref 4.22–5.81)
RDW: 15.1 % (ref 11.5–15.5)
WBC: 7.5 10*3/uL (ref 4.0–10.5)
nRBC: 0 % (ref 0.0–0.2)

## 2018-02-26 LAB — CMP (CANCER CENTER ONLY)
ALK PHOS: 136 U/L — AB (ref 38–126)
ALT: 35 U/L (ref 0–44)
ANION GAP: 10 (ref 5–15)
AST: 20 U/L (ref 15–41)
Albumin: 3.8 g/dL (ref 3.5–5.0)
BUN: 19 mg/dL (ref 6–20)
CHLORIDE: 110 mmol/L (ref 98–111)
CO2: 24 mmol/L (ref 22–32)
Calcium: 9 mg/dL (ref 8.9–10.3)
Creatinine: 1.2 mg/dL (ref 0.61–1.24)
GFR, Est AFR Am: >60 mL/min (ref >60)
Glucose, Bld: 157 mg/dL — ABNORMAL HIGH (ref 70–99)
Potassium: 4.1 mmol/L (ref 3.5–5.1)
SODIUM: 144 mmol/L (ref 135–145)
Total Bilirubin: 0.4 mg/dL (ref 0.3–1.2)
Total Protein: 7.4 g/dL (ref 6.5–8.1)

## 2018-02-26 NOTE — Telephone Encounter (Signed)
Scheduled appt per 01/24 los. ° °Printed calendar and avs. °

## 2018-02-26 NOTE — Progress Notes (Signed)
Diagnosis Acute pulmonary embolism without acute cor pulmonale, unspecified pulmonary embolism type (HCC) - Plan: VAS [place] LOWER EXTREMITY VENOUS (DVT), CT ANGIO CHEST PE W OR WO CONTRAST  Acute deep vein thrombosis (DVT) of distal vein of right lower extremity (HCC) - Plan: VAS [place] LOWER EXTREMITY VENOUS (DVT), CT ANGIO CHEST PE W OR WO CONTRAST  Staging Cancer Staging No matching staging information was found for the patient.  Assessment and Plan: 1.  PE and DVT.  53 yr old male previously followed by Dr. Audelia Hives.  On October 02, 2017 he presented to the emergency department at [facility] emergency department complaining of increased swelling and pain in the right leg, worsening now over the past several months along with substernal chest pain which he reports began several days prior to his presentation.  On admission CT angiography of the chest was performed the results of which revealed bilateral pulmonary emboli involving the lower lobe and segmental and subsegmental branches.  A 2D echocardiogram was performed on September 2, the results of which revealed no evidence of right strain or cor pulmonale.    Following his hospital discharge he has remained on rivaroxaban.  Pt had Bilateral LE doppler done 11/25/2017 that showed  Summary: Right: Findings consistent with age indeterminate deep vein thrombosis involving the right popliteal vein. Findings consistent with age indeterminate superficial vein thrombosis involving the right small saphenous vein. A cystic structure is found in the  popliteal fossa. Left: No evidence of common femoral vein obstruction.  Hypercoagulable work-up was negative for common etiologies of thrombophilia other than some lab studies that may have been affected by heparin.    Labs done 02/26/2018 reviewed and showed WBC 7.5 HB 12.9 plts 247,000.    Pt will be set up for CTA of chest and bilateral LE doppler in February 2020 for follow-up of prior findings.   He had CT of abdomen and pelvis done 11/11/2017 that was reviewed and showed Impression:  No acute findings.  Resolution of focal pancreatitis and mild reactive lymphadenopathy since previous study.  Mild sigmoid diverticulosis, without radiographic evidence of diverticulitis.  Stable small paraumbilical hernia containing only fat.  Pt will follow-up in 03/2018 to go over results.  Due to extent of thrombosis, he will likely be recommended for ongoing anticoagulation.    25 minutes spent with more than 50% spent in review of records, counseling and coordination of care.    Interval History:   Historical data obtained from note dated 12/08/2017.  53 yr old male previously followed by Dr. Audelia Hives.  On October 02, 2017 he presented to the emergency department at [facility] emergency department complaining of increased swelling and pain in the right leg, worsening now over the past several months along with substernal chest pain which he reports began several days prior to his presentation.  On admission CT angiography of the chest was performed the results of which revealed bilateral pulmonary emboli involving the lower lobe and segmental and subsegmental branches.  A 2D echocardiogram was performed on September 2, the results of which revealed no evidence of right strain or cor pulmonale.    While hospitalized, he received unfractionated heparin both by bolus and continuous infusion. The laboratory evaluation on August 30 revealed PT/INR 15.3/1.22 PTT 28 Antithrombin III 91 protein C activity 94 protein S activity 40 (63-140%) lupus anticoagulant was affected by the presence of heparin (after heparin neutralization) it normalized; beta-2 glycoprotein 1 IgG <9; beta-2 glycoprotein 1 IgM <9;  beta-2 glycoprotein 1 IgA <9; homocystine 13.7; factor V Leiden gene mutation by PCR: Negative; prothrombin gene mutation [id number]: Not identified; anticardiolipin antibodies IgG <9; anticardiolipin  antibody IgA <9; anticardiolipin antibody IgM 13 (indeterminate 12-20).   Starting heparin before the vitamin K dependent coagulation studies was felt to have affected the results.  After identifying heparin within the lupus anticoagulant test specimen, it was first neutralized and secondarily identified as a normal lupus anticoagulant.   Following his hospital discharge he has remained on rivaroxaban.  Pt had Bilateral LE doppler done 11/25/2017 that showed  Summary: Right: Findings consistent with age indeterminate deep vein thrombosis involving the right popliteal vein. Findings consistent with age indeterminate superficial vein thrombosis involving the right small saphenous vein. A cystic structure is found in the  popliteal fossa. Left: No evidence of common femoral vein obstruction.  Current Status:  Pt is seen today for follow-up.  He is here to go over labs.  He reports he is tolerating rivaroxaban.   Problem List Patient Active Problem List   Diagnosis Date Noted  . Osteoarthritis of right knee [M17.11] 12/10/2017  . DVT (deep venous thrombosis) ([place]) [I82.409] 10/13/2017  . Pulmonary embolism ([place]) [I26.99] 10/02/2017  . AKI (acute kidney injury) ([place]) [N17.9] 09/22/2017  . Chest pain [R07.9] 09/22/2017  . Hypertension [I10] 11/19/2016  . Type 2 diabetes mellitus ([place]) [E11.9] 08/19/2016  . Hyperlipidemia [E78.5] 08/19/2016  . Back pain [M54.9] 05/13/2016  . Hammertoe of second toe of right foot [M20.41] 03/06/2016  . Onychomycosis [B35.1] 04/16/2015  . 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 Gout [M10.9] 03/28/2015    Past Medical History Past Medical History:  Diagnosis Date  . Bronchitis   . Bronchitis   . DVT (deep venous thrombosis) ([place])   . Gout   . Hammertoe of second toe of right foot   . Smoker     Past Surgical History Past Surgical History:  Procedure Laterality Date  . HAMMER TOE SURGERY Right 03/10/2016   Procedure: 2ND RIGHT HAMMER TOE  CORRECTION;  Surgeon: Landis Martins, DPM;  Location: [place];  Service: Podiatry;  Laterality: Right;  . MASS EXCISION Right 03/10/2016   Procedure: EXCISION OF CALLUS 2ND RIGHT TOE;  Surgeon: Landis Martins, DPM;  Location: [place];  Service: Podiatry;  Laterality: Right;  . NO PAST SURGERIES      Family History Family History  Problem Relation Age of Onset  . Heart disease Neg Hx   . Kidney disease Neg Hx      Social History  reports that he has been smoking cigarettes. He has been smoking about 0.25 packs per day. He has never used smokeless tobacco. He reports that he does not drink alcohol or use drugs.  Medications  Current Outpatient Medications:  .  benzonatate (TESSALON) 100 MG capsule, Take 2 capsules (200 mg total) by mouth 3 (three) times daily as needed for cough., Disp: 40 capsule, Rfl: 0 .  cetirizine (ZYRTEC) 10 MG tablet, Take 1 tablet (10 mg total) by mouth daily., Disp: 30 tablet, Rfl: 1 .  fluticasone (FLONASE) 50 MCG/ACT nasal spray, Place 2 sprays into both nostrils daily., Disp: 16 g, Rfl: 6 .  lisinopril (PRINIVIL,ZESTRIL) 5 MG tablet, Take 1 tablet (5 mg total) by mouth daily., Disp: 30 tablet, Rfl: 3 .  metFORMIN (GLUCOPHAGE) 500 MG tablet, Take 1 tablet (500 mg total) by mouth daily with breakfast., Disp: 30 tablet, Rfl: 3 .  pantoprazole (PROTONIX) 40 MG tablet, Take  1 tablet (40 mg total) by mouth 2 (two) times daily., Disp: 60 tablet, Rfl: 3 .  rivaroxaban (XARELTO) 20 MG TABS tablet, Take 1 tablet (20 mg total) by mouth daily with supper., Disp: 30 tablet, Rfl: 2 .  Sildenafil Citrate (VIAGRA PO), 50 mg., Disp: , Rfl: 1 .  traMADol (ULTRAM) 50 MG tablet, Take 1 tablet (50 mg total) by mouth every 12 (twelve) hours as needed. (Patient not taking: Reported on 01/29/2018), Disp: 40 tablet, Rfl: 1  Allergies Vicodin [hydrocodone-acetaminophen]  Review of Systems Review of Systems - Oncology ROS negative   Physical  Exam  Vitals Wt Readings from Last 3 Encounters:  02/26/18 228 lb 4.8 oz (103.6 kg)  01/29/18 221 lb (100.2 kg)  12/24/17 226 lb 6.4 oz (102.7 kg)   Temp Readings from Last 3 Encounters:  02/26/18 98.2 F (36.8 C) (Oral)  01/29/18 98.2 F (36.8 C) (Oral)  12/24/17 97.6 F (36.4 C) (Oral)   BP Readings from Last 3 Encounters:  02/26/18 133/85  01/29/18 (!) 147/93  12/24/17 (!) 147/95   Pulse Readings from Last 3 Encounters:  02/26/18 88  01/29/18 78  12/24/17 74   Constitutional: Well-developed, well-nourished, and in no distress.   HENT: Head: Normocephalic and atraumatic.  Mouth/Throat: No oropharyngeal exudate. Mucosa moist. Eyes: Pupils are equal, round, and reactive to light. Conjunctivae are normal. No scleral icterus.  Neck: Normal range of motion. Neck supple. No JVD present.  Cardiovascular: Normal rate, regular rhythm and normal heart sounds.  Exam reveals no gallop and no friction rub.   No murmur heard. Pulmonary/Chest: Effort normal and breath sounds normal. No respiratory distress. No wheezes.No rales.  Abdominal: Soft. Bowel sounds are normal. No distension. There is no tenderness. There is no guarding.  Musculoskeletal: No edema or tenderness.  Lymphadenopathy: No cervical, axillary or supraclavicular adenopathy.  Neurological: Alert and oriented to person, place, and time. No cranial nerve deficit.  Skin: Skin is warm and dry. No rash noted. No erythema. No pallor.  Psychiatric: Affect and judgment normal.   Labs Appointment on 02/26/2018  Component Date Value Ref Range Status  . WBC Count 02/26/2018 7.5  4.0 - 10.5 K/uL Final  . RBC 02/26/2018 4.63  4.22 - 5.81 MIL/uL Final  . Hemoglobin 02/26/2018 12.9* 13.0 - 17.0 g/dL Final  . HCT 02/26/2018 40.5  39.0 - 52.0 % Final  . MCV 02/26/2018 87.5  80.0 - 100.0 fL Final  . MCH 02/26/2018 27.9  26.0 - 34.0 pg Final  . MCHC 02/26/2018 31.9  30.0 - 36.0 g/dL Final  . RDW 02/26/2018 15.1  11.5 - 15.5 % Final   . Platelet Count 02/26/2018 247  150 - 400 K/uL Final  . nRBC 02/26/2018 0.0  0.0 - 0.2 % Final  . Neutrophils Relative % 02/26/2018 44  % Final  . Neutro Abs 02/26/2018 3.3  1.7 - 7.7 K/uL Final  . Lymphocytes Relative 02/26/2018 47  % Final  . Lymphs Abs 02/26/2018 3.6  0.7 - 4.0 K/uL Final  . Monocytes Relative 02/26/2018 5  % Final  . Monocytes Absolute 02/26/2018 0.4  0.1 - 1.0 K/uL Final  . Eosinophils Relative 02/26/2018 3  % Final  . Eosinophils Absolute 02/26/2018 0.2  0.0 - 0.5 K/uL Final  . Basophils Relative 02/26/2018 1  % Final  . Basophils Absolute 02/26/2018 0.0  0.0 - 0.1 K/uL Final  . Immature Granulocytes 02/26/2018 0  % Final  . Abs Immature Granulocytes 02/26/2018 0.01  0.00 -  0.07 K/uL Final   Performed at [facility], [place] [address], [place], [place] [address]  . Sodium 02/26/2018 144  135 - 145 mmol/L Final  . Potassium 02/26/2018 4.1  3.5 - 5.1 mmol/L Final  . Chloride 02/26/2018 110  98 - 111 mmol/L Final  . CO2 02/26/2018 24  22 - 32 mmol/L Final  . Glucose, Bld 02/26/2018 157* 70 - 99 mg/dL Final  . BUN 02/26/2018 19  6 - 20 mg/dL Final  . Creatinine 02/26/2018 1.20  0.61 - 1.24 mg/dL Final  . Calcium 02/26/2018 9.0  8.9 - 10.3 mg/dL Final  . Total Protein 02/26/2018 7.4  6.5 - 8.1 g/dL Final  . Albumin 02/26/2018 3.8  3.5 - 5.0 g/dL Final  . AST 02/26/2018 20  15 - 41 U/L Final  . ALT 02/26/2018 35  0 - 44 U/L Final  . Alkaline Phosphatase 02/26/2018 136* 38 - 126 U/L Final  . Total Bilirubin 02/26/2018 0.4  0.3 - 1.2 mg/dL Final  . GFR, Est Non Af Am 02/26/2018 >60  >60 mL/min Final  . GFR, Est AFR Am 02/26/2018 >60  >60 mL/min Final  . Anion gap 02/26/2018 10  5 - 15 Final   Performed at [facility], [place] [address] [place], [place] [address]     Pathology Orders Placed This Encounter  Procedures  . CT ANGIO CHEST PE W OR WO CONTRAST    Standing Status:   Future    Standing Expiration Date:    05/28/2019    Order Specific Question:   If indicated for the ordered procedure, I authorize the administration of contrast media per Radiology protocol    Answer:   Yes    Order Specific Question:   Preferred imaging location?    Answer:   [facility]    Order Specific Question:   Radiology Contrast Protocol - do NOT remove file path    Answer:   [email or web address]       Zoila Shutter MD

## 2018-03-08 ENCOUNTER — Other Ambulatory Visit: Payer: Self-pay | Admitting: Internal Medicine

## 2018-03-08 DIAGNOSIS — N528 Other male erectile dysfunction: Secondary | ICD-10-CM

## 2018-03-08 MED FILL — PANTOPRAZOLE SOD DR 40 MG T: 40 | 30 days supply | Qty: 60 | Fill #0

## 2018-03-08 MED FILL — $Viagra 50mg tablet: 50 | 90 days supply | Qty: 30 | Fill #0

## 2018-03-16 ENCOUNTER — Encounter: Payer: Self-pay | Admitting: Family Medicine

## 2018-03-16 ENCOUNTER — Ambulatory Visit: Payer: BLUE CROSS/BLUE SHIELD | Attending: Family Medicine | Admitting: Family Medicine

## 2018-03-16 VITALS — BP 155/97 | HR 80 | Temp 98.1°F | Ht 69.0 in | Wt 226.0 lb

## 2018-03-16 DIAGNOSIS — I10 Essential (primary) hypertension: Secondary | ICD-10-CM | POA: Diagnosis not present

## 2018-03-16 DIAGNOSIS — J018 Other acute sinusitis: Secondary | ICD-10-CM

## 2018-03-16 DIAGNOSIS — I2699 Other pulmonary embolism without acute cor pulmonale: Secondary | ICD-10-CM | POA: Diagnosis not present

## 2018-03-16 DIAGNOSIS — M1A0720 Idiopathic chronic gout, left ankle and foot, without tophus (tophi): Secondary | ICD-10-CM

## 2018-03-16 DIAGNOSIS — E119 Type 2 diabetes mellitus without complications: Secondary | ICD-10-CM | POA: Diagnosis not present

## 2018-03-16 LAB — GLUCOSE, POCT (MANUAL RESULT ENTRY): POC Glucose: 114 mg/dl — AB (ref 70–99)

## 2018-03-16 MED ORDER — METFORMIN HCL 500 MG PO TABS: 500.0000 mg | ORAL_TABLET | Freq: Every day | ORAL | 3 refills | Status: DC

## 2018-03-16 MED ORDER — PANTOPRAZOLE SODIUM 40 MG PO TBEC: 40.0000 mg | DELAYED_RELEASE_TABLET | Freq: Two times a day (BID) | ORAL | 3 refills | Status: DC

## 2018-03-16 MED ORDER — LISINOPRIL 5 MG PO TABS: 5.0000 mg | ORAL_TABLET | Freq: Every day | ORAL | 3 refills | Status: DC

## 2018-03-16 MED ORDER — AMOXICILLIN 500 MG PO CAPS: 500.0000 mg | ORAL_CAPSULE | Freq: Three times a day (TID) | ORAL | 0 refills | Status: DC

## 2018-03-16 MED ORDER — ALLOPURINOL 300 MG PO TABS: 300.0000 mg | ORAL_TABLET | Freq: Every day | ORAL | 3 refills | Status: DC

## 2018-03-16 MED ORDER — RIVAROXABAN 20 MG PO TABS: 20.0000 mg | ORAL_TABLET | Freq: Every day | ORAL | 0 refills | Status: DC

## 2018-03-16 MED FILL — metFORMIN HCL 500 MG TABS: 500 | 30 days supply | Qty: 30 | Fill #0

## 2018-03-16 MED FILL — ALLOPURINOL 300 MG TAB: 300 | 30 days supply | Qty: 30 | Fill #0

## 2018-03-16 MED FILL — XARELTO 20 MG TABLET: 20 | 30 days supply | Qty: 30 | Fill #0

## 2018-03-16 MED FILL — LISINOPRIL 5 MG TAB: 5 | 30 days supply | Qty: 30 | Fill #0

## 2018-03-16 MED FILL — AMOXICILLIN 500 MG CAPSULE: 500 | 10 days supply | Qty: 30 | Fill #0

## 2018-03-16 NOTE — Patient Instructions (Signed)
Diabetes Mellitus and Exercise Exercising regularly is important for your overall health, especially when you have diabetes (diabetes mellitus). Exercising is not only about losing weight. It has many other health benefits, such as increasing muscle strength and bone density and reducing body fat and stress. This leads to improved fitness, flexibility, and endurance, all of which result in better overall health. Exercise has additional benefits for people with diabetes, including:  Reducing appetite.  Helping to lower and control blood glucose.  Lowering blood pressure.  Helping to control amounts of fatty substances (lipids) in the blood, such as cholesterol and triglycerides.  Helping the body to respond better to insulin (improving insulin sensitivity).  Reducing how much insulin the body needs.  Decreasing the risk for heart disease by: ? Lowering cholesterol and triglyceride levels. ? Increasing the levels of good cholesterol. ? Lowering blood glucose levels. What is my activity plan? Your health care provider or certified diabetes educator can help you make a plan for the type and frequency of exercise (activity plan) that works for you. Make sure that you:  Do at least 150 minutes of moderate-intensity or vigorous-intensity exercise each week. This could be brisk walking, biking, or water aerobics. ? Do stretching and strength exercises, such as yoga or weightlifting, at least 2 times a week. ? Spread out your activity over at least 3 days of the week.  Get some form of physical activity every day. ? Do not go more than 2 days in a row without some kind of physical activity. ? Avoid being inactive for more than 30 minutes at a time. Take frequent breaks to walk or stretch.  Choose a type of exercise or activity that you enjoy, and set realistic goals.  Start slowly, and gradually increase the intensity of your exercise over time. What do I need to know about managing my  diabetes?   Check your blood glucose before and after exercising. ? If your blood glucose is 240 mg/dL (13.3 mmol/L) or higher before you exercise, check your urine for ketones. If you have ketones in your urine, do not exercise until your blood glucose returns to normal. ? If your blood glucose is 100 mg/dL (5.6 mmol/L) or lower, eat a snack containing 15-20 grams of carbohydrate. Check your blood glucose 15 minutes after the snack to make sure that your level is above 100 mg/dL (5.6 mmol/L) before you start your exercise.  Know the symptoms of low blood glucose (hypoglycemia) and how to treat it. Your risk for hypoglycemia increases during and after exercise. Common symptoms of hypoglycemia can include: ? Hunger. ? Anxiety. ? Sweating and feeling clammy. ? Confusion. ? Dizziness or feeling light-headed. ? Increased heart rate or palpitations. ? Blurry vision. ? Tingling or numbness around the mouth, lips, or tongue. ? Tremors or shakes. ? Irritability.  Keep a rapid-acting carbohydrate snack available before, during, and after exercise to help prevent or treat hypoglycemia.  Avoid injecting insulin into areas of the body that are going to be exercised. For example, avoid injecting insulin into: ? The arms, when playing tennis. ? The legs, when jogging.  Keep records of your exercise habits. Doing this can help you and your health care provider adjust your diabetes management plan as needed. Write down: ? Food that you eat before and after you exercise. ? Blood glucose levels before and after you exercise. ? The type and amount of exercise you have done. ? When your insulin is expected to peak, if you use   insulin. Avoid exercising at times when your insulin is peaking.  When you start a new exercise or activity, work with your health care provider to make sure the activity is safe for you, and to adjust your insulin, medicines, or food intake as needed.  Drink plenty of water while  you exercise to prevent dehydration or heat stroke. Drink enough fluid to keep your urine clear or pale yellow. Summary  Exercising regularly is important for your overall health, especially when you have diabetes (diabetes mellitus).  Exercising has many health benefits, such as increasing muscle strength and bone density and reducing body fat and stress.  Your health care provider or certified diabetes educator can help you make a plan for the type and frequency of exercise (activity plan) that works for you.  When you start a new exercise or activity, work with your health care provider to make sure the activity is safe for you, and to adjust your insulin, medicines, or food intake as needed. This information is not intended to replace advice given to you by your health care provider. Make sure you discuss any questions you have with your health care provider. Document Released: 04/12/2003 Document Revised: 07/31/2016 Document Reviewed: 07/02/2015 Elsevier Interactive Patient Education  2019 [employer]  

## 2018-03-16 NOTE — Progress Notes (Signed)
refill on allopurinol.

## 2018-03-16 NOTE — Progress Notes (Signed)
Subjective:  Patient ID: Terry Poole, male    DOB: 1965-08-06  Age: 53 y.o. MRN: [id number]  CC: Diabetes   HPI Terry Poole  is a 53 year old male with a history of type 2 diabetes mellitus (A1c 6.5) Hyperlipidemia, hypertension, gout, bilateral PE and right lower extremity DVT (diagnosed in 09/2017) who presents today for a follow-up visit. He is on anticoagulation with Xarelto and denies bruising or bleeding.  Last seen by hematology last month and CTA of the chest ordered in order to determine if he will remain on lifelong anticoagulation versus discontinuation of anticoagulation given he has been on Xarelto for 6 months.  He complains of gout flares and has not needed allopurinol since 05/2017 but informs me he has had repeated flares in his left foot and endorses eating hamburgers from [employer]. He complains of puffiness under his eyelids, postnasal drainage with associated sinus tenderness but no fever, otalgia, sore throat and symptoms have been uncontrolled despite use of Flonase and Zyrtec which he received last month.  Denies presence of fever.  Endorses compliance with metformin, statin and his antihypertensive but his blood pressure is elevated today.  He does not exercise regularly.  Past Medical History:  Diagnosis Date  . Bronchitis   . Bronchitis   . DVT (deep venous thrombosis) ([place])   . Gout   . Hammertoe of second toe of right foot   . Smoker     Past Surgical History:  Procedure Laterality Date  . HAMMER TOE SURGERY Right 03/10/2016   Procedure: 2ND RIGHT HAMMER TOE CORRECTION;  Surgeon: Landis Martins, DPM;  Location: [place];  Service: Podiatry;  Laterality: Right;  . MASS EXCISION Right 03/10/2016   Procedure: EXCISION OF CALLUS 2ND RIGHT TOE;  Surgeon: Landis Martins, DPM;  Location: [place];  Service: Podiatry;  Laterality: Right;  . NO PAST SURGERIES      Family History  Problem Relation Age of Onset  . Heart disease Neg  Hx   . Kidney disease Neg Hx     Allergies  Allergen Reactions  . Vicodin [Hydrocodone-Acetaminophen] Hives    Patient stated he is no longer allergic to vicodin, patient stated that he was allergic about 12 years ago.     Outpatient Medications Prior to Visit  Medication Sig Dispense Refill  . benzonatate (TESSALON) 100 MG capsule Take 2 capsules (200 mg total) by mouth 3 (three) times daily as needed for cough. 40 capsule 0  . cetirizine (ZYRTEC) 10 MG tablet Take 1 tablet (10 mg total) by mouth daily. 30 tablet 1  . fluticasone (FLONASE) 50 MCG/ACT nasal spray Place 2 sprays into both nostrils daily. 16 g 6  . traMADol (ULTRAM) 50 MG tablet Take 1 tablet (50 mg total) by mouth every 12 (twelve) hours as needed. 40 tablet 1  . VIAGRA 50 MG tablet TAKE 1 TABLET BY MOUTH DAILY AS NEEDED FOR ERECTILE DYSFUNCTION. AT LEAST 24 HOURS BETWEEN DOSES 10 tablet 2  . lisinopril (PRINIVIL,ZESTRIL) 5 MG tablet Take 1 tablet (5 mg total) by mouth daily. 30 tablet 3  . metFORMIN (GLUCOPHAGE) 500 MG tablet Take 1 tablet (500 mg total) by mouth daily with breakfast. 30 tablet 3  . pantoprazole (PROTONIX) 40 MG tablet Take 1 tablet (40 mg total) by mouth 2 (two) times daily. 60 tablet 3  . rivaroxaban (XARELTO) 20 MG TABS tablet Take 1 tablet (20 mg total) by mouth daily with supper. 30 tablet 2   No facility-administered  medications prior to visit.      ROS Review of Systems General: negative for fever, weight loss, appetite change Eyes: no visual symptoms. ENT: see hpi Neck: no pain  Respiratory: no wheezing, shortness of breath, cough Cardiovascular: no chest pain, no dyspnea on exertion, no pedal edema, no orthopnea. Gastrointestinal: no abdominal pain, no diarrhea, no constipation Genito-Urinary: no urinary frequency, no dysuria, no polyuria. Hematologic: no bruising Endocrine: no cold or heat intolerance Neurological: no headaches, no seizures, no tremors Musculoskeletal: no joint pains,  no joint swelling Skin: no pruritus, no rash. Psychological: no depression, no anxiety,    Objective:  BP (!) 155/97   Pulse 80   Temp 98.1 F (36.7 C) (Oral)   Ht 5\' 9"  (1.753 m)   Wt 226 lb (102.5 kg)   SpO2 100%   BMI 33.37 kg/m   BP/Weight 03/16/2018 02/26/2018 14/48/1856  Systolic BP 314 970 263  Diastolic BP 97 85 93  Wt. (Lbs) 226 228.3 221  BMI 33.37 33.71 32.64      Physical Exam Constitutional: normal appearing,  Eyes: PERRLA HEENT: Head is atraumatic, TTP of maxillary and ethmoidal sinuses, erythematous oropharynx, tonsils and palate, tympanic membrane is normal bilaterally. Neck: normal range of motion, no thyromegaly, no JVD Cardiovascular: normal rate and rhythm, normal heart sounds, no murmurs, rub or gallop, no pedal edema Respiratory: Normal breath sounds, clear to auscultation bilaterally, no wheezes, no rales, no rhonchi Abdomen: soft, not tender to palpation, normal bowel sounds, no enlarged organs Musculoskeletal: Full ROM, no tenderness in joints Skin: warm and dry, no lesions. Neurological: alert, oriented x3, cranial nerves I-XII grossly intact , normal motor strength, normal sensation. Psychological: normal mood.   CMP Latest Ref Rng & Units 02/26/2018 12/07/2017 11/09/2017  Glucose 70 - 99 mg/dL 157(H) 163(H) 93  BUN 6 - 20 mg/dL 19 11 10   Creatinine 0.61 - 1.24 mg/dL 1.20 1.17 1.17  Sodium 135 - 145 mmol/L 144 143 142  Potassium 3.5 - 5.1 mmol/L 4.1 3.4(L) 4.2  Chloride 98 - 111 mmol/L 110 108 105  CO2 22 - 32 mmol/L 24 24 29   Calcium 8.9 - 10.3 mg/dL 9.0 9.1 9.6  Total Protein 6.5 - 8.1 g/dL 7.4 - 8.5(H)  Total Bilirubin 0.3 - 1.2 mg/dL 0.4 - 0.7  Alkaline Phos 38 - 126 U/L 136(H) - 136(H)  AST 15 - 41 U/L 20 - 29  ALT 0 - 44 U/L 35 - 70(H)    Lipid Panel     Component Value Date/Time   CHOL 126 06/11/2017 0918   TRIG 105 06/11/2017 0918   HDL 48 06/11/2017 0918   CHOLHDL 2.6 06/11/2017 0918   CHOLHDL 2.6 09/21/2015 1053   VLDL 47  (H) 09/21/2015 1053   LDLCALC 57 06/11/2017 0918    CBC    Component Value Date/Time   WBC 7.5 02/26/2018 0936   WBC 7.3 12/07/2017 1412   RBC 4.63 02/26/2018 0936   HGB 12.9 (L) 02/26/2018 0936   HGB 14.0 09/24/2017 1106   HCT 40.5 02/26/2018 0936   HCT 43.6 09/24/2017 1106   PLT 247 02/26/2018 0936   PLT 205 09/24/2017 1106   MCV 87.5 02/26/2018 0936   MCV 86 09/24/2017 1106   MCH 27.9 02/26/2018 0936   MCHC 31.9 02/26/2018 0936   RDW 15.1 02/26/2018 0936   RDW 15.1 09/24/2017 1106   LYMPHSABS 3.6 02/26/2018 0936   LYMPHSABS 2.3 09/24/2017 1106   MONOABS 0.4 02/26/2018 0936   EOSABS 0.2 02/26/2018  0936   EOSABS 4.2 (H) 09/24/2017 1106   BASOSABS 0.0 02/26/2018 0936   BASOSABS 0.0 09/24/2017 1106    Lab Results  Component Value Date   HGBA1C 6.5 12/10/2017    Assessment & Plan:   1. Type 2 diabetes mellitus without complication, without long-term current use of insulin (HCC) Controlled with A1c of 6.5 Counseled on Diabetic diet, my plate method, 417 minutes of moderate intensity exercise/week Keep blood sugar logs with fasting goals of 80-120 mg/dl, random of less than 180 and in the event of sugars less than 60 mg/dl or greater than 400 mg/dl please notify the clinic ASAP. It is recommended that you undergo annual eye exams and annual foot exams. Pneumonia vaccine is recommended. - POCT glucose (manual entry) - Hemoglobin A1c - metFORMIN (GLUCOPHAGE) 500 MG tablet; Take 1 tablet (500 mg total) by mouth daily with breakfast.  Dispense: 30 tablet; Refill: 3  2. Essential hypertension Uncontrolled Endorses compliance with antihypertensive even though this cannot be confirmed as he had resisted initiation of lisinopril previously Counseled on blood pressure goal of less than 130/80, low-sodium, DASH diet, medication compliance, 150 minutes of moderate intensity exercise per week. Discussed medication compliance, adverse effects. - lisinopril (PRINIVIL,ZESTRIL) 5 MG  tablet; Take 1 tablet (5 mg total) by mouth daily.  Dispense: 30 tablet; Refill: 3  3. Acute non-recurrent sinusitis of other sinus Symptoms have been unresponsive to the use of nasal spray and antihistamine - amoxicillin (AMOXIL) 500 MG capsule; Take 1 capsule (500 mg total) by mouth 3 (three) times daily.  Dispense: 30 capsule; Refill: 0  4. Acute pulmonary embolism without acute cor pulmonale, unspecified pulmonary embolism type (HCC) Decision to continue Xarelto long-term versus discontinuation to be determined by hematology once CT angiogram of the chest has been obtained He will be following up with hematology this month. - rivaroxaban (XARELTO) 20 MG TABS tablet; Take 1 tablet (20 mg total) by mouth daily with supper.  Dispense: 30 tablet; Refill: 0  5. Chronic idiopathic gout involving toe of left foot without tophus Previously controlled now with recurrent flares due to noncompliance with low purine diet Educated on low purine eating plan - allopurinol (ZYLOPRIM) 300 MG tablet; Take 1 tablet (300 mg total) by mouth daily.  Dispense: 30 tablet; Refill: 3   Meds ordered this encounter  Medications  . allopurinol (ZYLOPRIM) 300 MG tablet    Sig: Take 1 tablet (300 mg total) by mouth daily.    Dispense:  30 tablet    Refill:  3  . lisinopril (PRINIVIL,ZESTRIL) 5 MG tablet    Sig: Take 1 tablet (5 mg total) by mouth daily.    Dispense:  30 tablet    Refill:  3  . metFORMIN (GLUCOPHAGE) 500 MG tablet    Sig: Take 1 tablet (500 mg total) by mouth daily with breakfast.    Dispense:  30 tablet    Refill:  3  . pantoprazole (PROTONIX) 40 MG tablet    Sig: Take 1 tablet (40 mg total) by mouth 2 (two) times daily.    Dispense:  60 tablet    Refill:  3  . rivaroxaban (XARELTO) 20 MG TABS tablet    Sig: Take 1 tablet (20 mg total) by mouth daily with supper.    Dispense:  30 tablet    Refill:  0    Commence on 10/28/2017 after completion of starter dose  . amoxicillin (AMOXIL) 500  MG capsule    Sig: Take 1 capsule (  500 mg total) by mouth 3 (three) times daily.    Dispense:  30 capsule    Refill:  0    Follow-up: Return in about 3 months (around 06/14/2018) for Follow-up of chronic medical conditions.       [name], MD, FAAFP. [facility] [place], [place]   03/16/2018, 2:10 PM

## 2018-03-17 LAB — HEMOGLOBIN A1C
Est. average glucose Bld gHb Est-mCnc: 140 mg/dL
Hgb A1c MFr Bld: 6.5 % — ABNORMAL HIGH (ref 4.8–5.6)

## 2018-03-18 ENCOUNTER — Telehealth: Payer: Self-pay

## 2018-03-18 NOTE — Telephone Encounter (Signed)
-----   Message from [name], MD sent at 03/17/2018  2:06 PM EST ----- A1c stable at 6.5.  Continue current management

## 2018-03-18 NOTE — Telephone Encounter (Signed)
Patient was called and informed of lab results via voicemail. 

## 2018-03-22 ENCOUNTER — Emergency Department (HOSPITAL_COMMUNITY)
Admission: EM | Admit: 2018-03-22 | Discharge: 2018-03-23 | Disposition: A | Discharge status: Home / Self Care | Payer: BLUE CROSS/BLUE SHIELD | Attending: Emergency Medicine | Admitting: Emergency Medicine

## 2018-03-22 DIAGNOSIS — Z8611 Personal history of tuberculosis: Secondary | ICD-10-CM | POA: Diagnosis not present

## 2018-03-22 DIAGNOSIS — E119 Type 2 diabetes mellitus without complications: Secondary | ICD-10-CM | POA: Insufficient documentation

## 2018-03-22 DIAGNOSIS — R202 Paresthesia of skin: Secondary | ICD-10-CM | POA: Insufficient documentation

## 2018-03-22 DIAGNOSIS — M545 Low back pain, unspecified: Secondary | ICD-10-CM

## 2018-03-22 DIAGNOSIS — F17210 Nicotine dependence, cigarettes, uncomplicated: Secondary | ICD-10-CM | POA: Diagnosis not present

## 2018-03-22 DIAGNOSIS — I10 Essential (primary) hypertension: Secondary | ICD-10-CM | POA: Diagnosis not present

## 2018-03-22 DIAGNOSIS — Z7984 Long term (current) use of oral hypoglycemic drugs: Secondary | ICD-10-CM | POA: Insufficient documentation

## 2018-03-22 DIAGNOSIS — Z7901 Long term (current) use of anticoagulants: Secondary | ICD-10-CM | POA: Diagnosis not present

## 2018-03-22 DIAGNOSIS — Z86718 Personal history of other venous thrombosis and embolism: Secondary | ICD-10-CM | POA: Diagnosis not present

## 2018-03-23 ENCOUNTER — Encounter (HOSPITAL_COMMUNITY): Payer: Self-pay | Admitting: Emergency Medicine

## 2018-03-23 ENCOUNTER — Other Ambulatory Visit: Payer: Self-pay

## 2018-03-23 ENCOUNTER — Emergency Department (HOSPITAL_COMMUNITY): Payer: BLUE CROSS/BLUE SHIELD

## 2018-03-23 LAB — CBC WITH DIFFERENTIAL/PLATELET
Abs Immature Granulocytes: 0.01 10*3/uL (ref 0.00–0.07)
Basophils Absolute: 0 10*3/uL (ref 0.0–0.1)
Basophils Relative: 0 %
Eosinophils Absolute: 0.2 10*3/uL (ref 0.0–0.5)
Eosinophils Relative: 3 %
HCT: 38.2 % — ABNORMAL LOW (ref 39.0–52.0)
Hemoglobin: 12.3 g/dL — ABNORMAL LOW (ref 13.0–17.0)
Immature Granulocytes: 0 %
Lymphocytes Relative: 51 %
Lymphs Abs: 2.9 10*3/uL (ref 0.7–4.0)
MCH: 28.5 pg (ref 26.0–34.0)
MCHC: 32.2 g/dL (ref 30.0–36.0)
MCV: 88.6 fL (ref 80.0–100.0)
Monocytes Absolute: 0.3 10*3/uL (ref 0.1–1.0)
Monocytes Relative: 6 %
Neutro Abs: 2.3 10*3/uL (ref 1.7–7.7)
Neutrophils Relative %: 40 %
Platelets: 217 10*3/uL (ref 150–400)
RBC: 4.31 MIL/uL (ref 4.22–5.81)
RDW: 15.8 % — ABNORMAL HIGH (ref 11.5–15.5)
WBC: 5.7 10*3/uL (ref 4.0–10.5)
nRBC: 0 % (ref 0.0–0.2)

## 2018-03-23 LAB — BASIC METABOLIC PANEL
Anion gap: 11 (ref 5–15)
BUN: 12 mg/dL (ref 6–20)
CO2: 21 mmol/L — ABNORMAL LOW (ref 22–32)
Calcium: 8.9 mg/dL (ref 8.9–10.3)
Chloride: 108 mmol/L (ref 98–111)
Creatinine, Ser: 0.94 mg/dL (ref 0.61–1.24)
GFR calc Af Amer: >60 mL/min (ref >60)
GFR calc non Af Amer: >60 mL/min (ref >60)
Glucose, Bld: 119 mg/dL — ABNORMAL HIGH (ref 70–99)
Potassium: 3.7 mmol/L (ref 3.5–5.1)
Sodium: 140 mmol/L (ref 135–145)

## 2018-03-23 MED ORDER — METHOCARBAMOL 500 MG PO TABS: 500.0000 mg | ORAL_TABLET | Freq: Two times a day (BID) | ORAL | 0 refills | Status: DC

## 2018-03-23 MED ORDER — KETOROLAC TROMETHAMINE 30 MG/ML IJ SOLN
15.0000 mg | Freq: Once | INTRAMUSCULAR | Status: AC
  Administered 2018-03-23: 15 mg via INTRAMUSCULAR
  Filled 2018-03-23: qty 1

## 2018-03-23 MED ORDER — ACETAMINOPHEN 500 MG PO TABS: 1000.0000 mg | ORAL_TABLET | Freq: Three times a day (TID) | ORAL | 0 refills | Status: DC | PRN

## 2018-03-23 MED FILL — METHOCARBAMOL 500 MG TABS: 500 | 10 days supply | Qty: 20 | Fill #0

## 2018-03-23 NOTE — ED Provider Notes (Signed)
[facility] EMERGENCY DEPARTMENT Provider Note   CSN: [id number] Arrival date & time: 03/22/18  2211    History   Chief Complaint Chief Complaint  Patient presents with  . Leg Pain    HPI Terry Poole is a 53 y.o. male with history of gout, DVT anticoagulated on Xarelto, osteoarthritis of right knee who presents with a several month history of progressively worsening low back pain and bilateral leg tingling and intermittent weakness.  Patient reports sometimes he feels like his legs are going to give out on him.  He reports if he is walking around and keeping busy at work he does not necessarily feel the symptoms, but does limp sometimes.  He reports his symptoms are improved with Tylenol, but return when the Tylenol wears off.  He has not seen his doctor specifically for this yet.  He denies any leg swelling or significant pain in his calf.  He reports sometimes having a pulling sensation from his knee when he walks.  He has been evaluated for his knee pain in the past and diagnosed with arthritis.  He is compliant with his Xarelto.  He denies any chest pain, shortness of breath, abdominal pain, nausea, vomiting, urinary symptoms.     HPI  Past Medical History:  Diagnosis Date  . Bronchitis   . Bronchitis   . DVT (deep venous thrombosis) ([place])   . Gout   . Hammertoe of second toe of right foot   . Smoker     Patient Active Problem List   Diagnosis Date Noted  . Osteoarthritis of right knee 12/10/2017  . DVT (deep venous thrombosis) ([place]) 10/13/2017  . Pulmonary embolism ([place]) 10/02/2017  . AKI (acute kidney injury) ([place]) 09/22/2017  . Chest pain 09/22/2017  . Hypertension 11/19/2016  . Type 2 diabetes mellitus ([place]) 08/19/2016  . Hyperlipidemia 08/19/2016  . Back pain 05/13/2016  . Hammertoe of second toe of right foot 03/06/2016  . Onychomycosis 04/16/2015  . 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 Gout 03/28/2015    Past Surgical History:    Procedure Laterality Date  . HAMMER TOE SURGERY Right 03/10/2016   Procedure: 2ND RIGHT HAMMER TOE CORRECTION;  Surgeon: Landis Martins, DPM;  Location: [place];  Service: Podiatry;  Laterality: Right;  . MASS EXCISION Right 03/10/2016   Procedure: EXCISION OF CALLUS 2ND RIGHT TOE;  Surgeon: Landis Martins, DPM;  Location: [place];  Service: Podiatry;  Laterality: Right;  . NO PAST SURGERIES          Home Medications    Prior to Admission medications   Medication Sig Start Date End Date Taking? Authorizing Provider  acetaminophen (TYLENOL) 500 MG tablet Take 2 tablets (1,000 mg total) by mouth every 8 (eight) hours as needed for moderate pain. 03/23/18   Babbette Dalesandro, Bea Graff, PA-C  allopurinol (ZYLOPRIM) 300 MG tablet Take 1 tablet (300 mg total) by mouth daily. 03/16/18   [name], MD  amoxicillin (AMOXIL) 500 MG capsule Take 1 capsule (500 mg total) by mouth 3 (three) times daily. 03/16/18   [name], MD  benzonatate (TESSALON) 100 MG capsule Take 2 capsules (200 mg total) by mouth 3 (three) times daily as needed for cough. 01/29/18   Argentina Donovan, PA-C  cetirizine (ZYRTEC) 10 MG tablet Take 1 tablet (10 mg total) by mouth daily. 11/24/17   [name], MD  fluticasone (FLONASE) 50 MCG/ACT nasal spray Place 2 sprays into both nostrils daily. 01/29/18   McClung,  Dionne Bucy, PA-C  lisinopril (PRINIVIL,ZESTRIL) 5 MG tablet Take 1 tablet (5 mg total) by mouth daily. 03/16/18   [name], MD  metFORMIN (GLUCOPHAGE) 500 MG tablet Take 1 tablet (500 mg total) by mouth daily with breakfast. 03/16/18   [name], MD  methocarbamol (ROBAXIN) 500 MG tablet Take 1 tablet (500 mg total) by mouth 2 (two) times daily. 03/23/18   Marni Franzoni, Bea Graff, PA-C  pantoprazole (PROTONIX) 40 MG tablet Take 1 tablet (40 mg total) by mouth 2 (two) times daily. 03/16/18   [name], MD  rivaroxaban (XARELTO) 20 MG TABS tablet Take 1 tablet (20 mg total) by  mouth daily with supper. 03/16/18   [name], MD  traMADol (ULTRAM) 50 MG tablet Take 1 tablet (50 mg total) by mouth every 12 (twelve) hours as needed. 12/10/17   [name], MD  VIAGRA 50 MG tablet TAKE 1 TABLET BY MOUTH DAILY AS NEEDED FOR ERECTILE DYSFUNCTION. AT LEAST 24 HOURS BETWEEN DOSES 03/08/18   [name], MD    Family History Family History  Problem Relation Age of Onset  . Heart disease Neg Hx   . Kidney disease Neg Hx     Social History Social History   Tobacco Use  . Smoking status: Current Every Day Smoker    Packs/day: 0.25    Types: Cigarettes  . Smokeless tobacco: Never Used  . Tobacco comment: 8 cigs daily  Substance Use Topics  . Alcohol use: No  . Drug use: No     Allergies   Vicodin [hydrocodone-acetaminophen]   Review of Systems Review of Systems  Constitutional: Negative for chills and fever.  HENT: Negative for facial swelling and sore throat.   Respiratory: Negative for shortness of breath.   Cardiovascular: Negative for chest pain.  Gastrointestinal: Negative for abdominal pain, nausea and vomiting.  Genitourinary: Negative for dysuria.  Musculoskeletal: Positive for back pain and myalgias.  Skin: Negative for rash and wound.  Neurological: Positive for numbness (paresthesia). Negative for headaches.  Psychiatric/Behavioral: The patient is not nervous/anxious.      Physical Exam Updated Vital Signs BP (!) 143/84 (BP Location: Right Arm)   Pulse 65   Temp 97.7 F (36.5 C) (Oral)   Resp 16   SpO2 98%   Physical Exam Vitals signs and nursing note reviewed.  Constitutional:      General: He is not in acute distress.    Appearance: He is well-developed. He is not diaphoretic.  HENT:     Head: Normocephalic and atraumatic.     Mouth/Throat:     Pharynx: No oropharyngeal exudate.  Eyes:     General: No scleral icterus.       Right eye: No discharge.        Left eye: No discharge.     Conjunctiva/sclera:  Conjunctivae normal.     Pupils: Pupils are equal, round, and reactive to light.  Neck:     Musculoskeletal: Normal range of motion and neck supple.     Thyroid: No thyromegaly.  Cardiovascular:     Rate and Rhythm: Normal rate and regular rhythm.     Heart sounds: Normal heart sounds. No murmur. No friction rub. No gallop.   Pulmonary:     Effort: Pulmonary effort is normal. No respiratory distress.     Breath sounds: Normal breath sounds. No stridor. No wheezing or rales.  Abdominal:     General: Bowel sounds are normal. There is no distension.     Palpations: Abdomen  is soft.     Tenderness: There is no abdominal tenderness. There is no guarding or rebound.  Musculoskeletal:       Back:     Comments: Tenderness over the lower lumbar spine and paraspinal regions into the buttocks region No calf tenderness or swelling bilaterally; some tenderness behind bilateral knees, however patient states this is chronic  Lymphadenopathy:     Cervical: No cervical adenopathy.  Skin:    General: Skin is warm and dry.     Coloration: Skin is not pale.     Findings: No rash.  Neurological:     Mental Status: He is alert.     Coordination: Coordination normal.     Comments: 5/5 strength to bilateral lower extremities, normal sensation, straight leg raise intact      ED Treatments / Results  Labs (all labs ordered are listed, but only abnormal results are displayed) Labs Reviewed  BASIC METABOLIC PANEL - Abnormal; Notable for the following components:      Result Value   CO2 21 (*)    Glucose, Bld 119 (*)    All other components within normal limits  CBC WITH DIFFERENTIAL/PLATELET - Abnormal; Notable for the following components:   Hemoglobin 12.3 (*)    HCT 38.2 (*)    RDW 15.8 (*)    All other components within normal limits    EKG None  Radiology Dg Lumbar Spine Complete  Result Date: 03/23/2018 CLINICAL DATA:  Low back pain radiating to the bilateral lower extremities for  several months. EXAM: LUMBAR SPINE - COMPLETE 4+ VIEW COMPARISON:  12/07/2017 FINDINGS: Generalized degenerative disc narrowing and spondylosis. Transitional L5 vertebra with pseudoarticulation between the left transverse process and sacrum. Mild dextrocurvature. No evidence of superimposed fracture or bone lesion. No endplate erosion. IMPRESSION: 1. No acute finding. 2. Advanced generalized spondylosis and degenerative disc narrowing. Electronically Signed   By: Monte Fantasia M.D.   On: 03/23/2018 08:55    Procedures Procedures (including critical care time)  Medications Ordered in ED Medications  ketorolac (TORADOL) 30 MG/ML injection 15 mg (15 mg Intramuscular Given 03/23/18 0851)     Initial Impression / Assessment and Plan / ED Course  I have reviewed the triage vital signs and the nursing notes.  Pertinent labs & imaging results that were available during my care of the patient were reviewed by me and considered in my medical decision making (see chart for details).        Patient presenting with suspected radicular back pain.  His pain is much improved after IM Toradol 15 mg.,  However considering Xarelto, cannot continue NSAIDs at home.  Patient will be given Tylenol 1000 mg every 8 hours and Robaxin.  Ice and heat discussed.  Exercises discussed.  Follow-up to PCP for further evaluation and possible MRI.  X-ray shows no acute finding, but advanced generalized spondylosis and degenerative disc narrowing.  Labs are within normal limits for the patient.  He is afebrile.  He is neurovascularly intact.  No signs of cauda equina.  Return precautions discussed.  Patient understands and agrees with plan.  Patient vital stable throughout ED course and discharged in satisfactory condition.  Final Clinical Impressions(s) / ED Diagnoses   Final diagnoses:  Acute bilateral low back pain without sciatica    ED Discharge Orders         Ordered    acetaminophen (TYLENOL) 500 MG tablet  Every  8 hours PRN     03/23/18 1019  methocarbamol (ROBAXIN) 500 MG tablet  2 times daily     03/23/18 [address] [place], [place] 03/23/18 1022    Fredia Sorrow, MD 03/24/18 1303

## 2018-03-23 NOTE — ED Notes (Signed)
Pt reports pain in lower back that goes down into bilateral legs with tingling. On exam no weakness noted in his legs however pt states when walking his legs "feel" weak. Pt has no swelling in lower legs and sensation is intact.

## 2018-03-23 NOTE — ED Triage Notes (Signed)
Pt c/o bilateral leg pain/weakness. Seen for same by PCP, states labs were normal, hx DVT. No swelling noted, denies chest pain and shortness of breath.

## 2018-03-23 NOTE — Discharge Instructions (Addendum)
Take Tylenol as prescribed every 8 hours.  Take Robaxin twice daily as needed for muscle pain or spasms.  Do not drive or operate machinery while taking this medication.  Use ice and heat to your back alternating 20 minutes on, 20 minutes off.  Attempt the exercises and stretches as below, as tolerated.  Please follow-up with your doctor for further management and possibly further imaging of your back.  Please return to the emergency department if you develop any new or worsening symptoms, including complete numbness of your groin, loss of bowel or bladder control, fever over 100.4, inability to urinate or move your bowels, or any other concerning symptoms

## 2018-03-29 ENCOUNTER — Ambulatory Visit (HOSPITAL_COMMUNITY)
Admission: RE | Admit: 2018-03-29 | Discharge: 2018-03-29 | Disposition: A | Discharge status: Home / Self Care | Payer: BLUE CROSS/BLUE SHIELD | Source: Ambulatory Visit | Attending: Internal Medicine | Admitting: Internal Medicine

## 2018-03-29 ENCOUNTER — Encounter (HOSPITAL_COMMUNITY): Payer: Self-pay

## 2018-03-29 ENCOUNTER — Ambulatory Visit (HOSPITAL_BASED_OUTPATIENT_CLINIC_OR_DEPARTMENT_OTHER)
Admission: RE | Admit: 2018-03-29 | Discharge: 2018-03-29 | Disposition: A | Discharge status: Home / Self Care | Payer: BLUE CROSS/BLUE SHIELD | Source: Ambulatory Visit | Attending: Internal Medicine | Admitting: Internal Medicine

## 2018-03-29 DIAGNOSIS — I2699 Other pulmonary embolism without acute cor pulmonale: Secondary | ICD-10-CM

## 2018-03-29 DIAGNOSIS — I824Z1 Acute embolism and thrombosis of unspecified deep veins of right distal lower extremity: Secondary | ICD-10-CM | POA: Diagnosis present

## 2018-03-29 MED ORDER — SODIUM CHLORIDE (PF) 0.9 % IJ SOLN
INTRAMUSCULAR | Status: AC
  Filled 2018-03-29: qty 50

## 2018-03-29 MED ORDER — IOPAMIDOL (ISOVUE-370) INJECTION 76%
100.0000 mL | Freq: Once | INTRAVENOUS | Status: AC | PRN
  Administered 2018-03-29: 100 mL via INTRAVENOUS

## 2018-03-29 MED ORDER — IOPAMIDOL (ISOVUE-370) INJECTION 76%
INTRAVENOUS | Status: AC
  Filled 2018-03-29: qty 100

## 2018-03-29 NOTE — Progress Notes (Signed)
Right lower extremity venous duplex has been completed. Preliminary results can be found in CV Proc through chart review.  Results were given to Dr. Walden Field.  03/29/18 8:52 AM Terry Poole RVT

## 2018-04-08 ENCOUNTER — Inpatient Hospital Stay: Payer: BLUE CROSS/BLUE SHIELD | Admitting: Internal Medicine

## 2018-04-14 ENCOUNTER — Other Ambulatory Visit: Payer: Self-pay | Admitting: Family Medicine

## 2018-04-14 DIAGNOSIS — I2699 Other pulmonary embolism without acute cor pulmonale: Secondary | ICD-10-CM

## 2018-04-14 MED FILL — XARELTO 20 MG TABLET: 20 | 30 days supply | Qty: 30 | Fill #2

## 2018-04-15 ENCOUNTER — Inpatient Hospital Stay: Payer: BLUE CROSS/BLUE SHIELD | Admitting: Internal Medicine

## 2018-04-15 NOTE — Telephone Encounter (Signed)
Duration of anticoagulation is still in question as per hematology.  I would suggest he discuss this with hematology at his upcoming visit in 1 week and they will refill it if he is to remain on long-term anticoagulation as he has completed short course of anticoagulation required.

## 2018-04-16 ENCOUNTER — Emergency Department (HOSPITAL_COMMUNITY)
Admission: EM | Admit: 2018-04-16 | Discharge: 2018-04-16 | Disposition: A | Discharge status: Home / Self Care | Payer: BLUE CROSS/BLUE SHIELD | Attending: Emergency Medicine | Admitting: Emergency Medicine

## 2018-04-16 ENCOUNTER — Encounter (HOSPITAL_COMMUNITY): Payer: Self-pay | Admitting: Emergency Medicine

## 2018-04-16 ENCOUNTER — Emergency Department (HOSPITAL_COMMUNITY): Payer: BLUE CROSS/BLUE SHIELD

## 2018-04-16 DIAGNOSIS — I10 Essential (primary) hypertension: Secondary | ICD-10-CM | POA: Insufficient documentation

## 2018-04-16 DIAGNOSIS — M25561 Pain in right knee: Secondary | ICD-10-CM | POA: Diagnosis present

## 2018-04-16 DIAGNOSIS — Z79899 Other long term (current) drug therapy: Secondary | ICD-10-CM | POA: Diagnosis not present

## 2018-04-16 DIAGNOSIS — F17210 Nicotine dependence, cigarettes, uncomplicated: Secondary | ICD-10-CM | POA: Diagnosis not present

## 2018-04-16 DIAGNOSIS — Z7984 Long term (current) use of oral hypoglycemic drugs: Secondary | ICD-10-CM | POA: Diagnosis not present

## 2018-04-16 DIAGNOSIS — E119 Type 2 diabetes mellitus without complications: Secondary | ICD-10-CM | POA: Insufficient documentation

## 2018-04-16 DIAGNOSIS — G8929 Other chronic pain: Secondary | ICD-10-CM

## 2018-04-16 NOTE — ED Provider Notes (Signed)
[facility] EMERGENCY DEPARTMENT Provider Note   CSN: [id number] Arrival date & time: 04/16/18  2030    History   Chief Complaint No chief complaint on file.   HPI Terry Poole is a 53 y.o. male.     Patient presents to the emergency department with a chief complaint of right knee pain.  He states that he has chronic right knee pain.  He also reports having a DVT and is anticoagulated on Xarelto.  He has been compliant with the medication.  States that he recently had an ultrasound, and it showed that the DVT still persists.  He is concerned about his ability to work.  He states that after standing on his feet all day for extended periods of time his knee begins to throb.  He states that he has known arthritis in his right knee, and feels like there is a bone pressing on something in his knee.  He has not seen any specialist.  He states that he would like an x-ray and a note for work.  The history is provided by the patient. No language interpreter was used.    Past Medical History:  Diagnosis Date  . Bronchitis   . Bronchitis   . DVT (deep venous thrombosis) ([place])   . Gout   . Hammertoe of second toe of right foot   . Smoker     Patient Active Problem List   Diagnosis Date Noted  . Osteoarthritis of right knee 12/10/2017  . DVT (deep venous thrombosis) ([place]) 10/13/2017  . Pulmonary embolism ([place]) 10/02/2017  . AKI (acute kidney injury) ([place]) 09/22/2017  . Chest pain 09/22/2017  . Hypertension 11/19/2016  . Type 2 diabetes mellitus ([place]) 08/19/2016  . Hyperlipidemia 08/19/2016  . Back pain 05/13/2016  . Hammertoe of second toe of right foot 03/06/2016  . Onychomycosis 04/16/2015  . 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 Gout 03/28/2015    Past Surgical History:  Procedure Laterality Date  . HAMMER TOE SURGERY Right 03/10/2016   Procedure: 2ND RIGHT HAMMER TOE CORRECTION;  Surgeon: Landis Martins, DPM;  Location: [facility];   Service: Podiatry;  Laterality: Right;  . MASS EXCISION Right 03/10/2016   Procedure: EXCISION OF CALLUS 2ND RIGHT TOE;  Surgeon: Landis Martins, DPM;  Location: [facility];  Service: Podiatry;  Laterality: Right;  . NO PAST SURGERIES          Home Medications    Prior to Admission medications   Medication Sig Start Date End Date Taking? Authorizing Provider  acetaminophen (TYLENOL) 500 MG tablet Take 2 tablets (1,000 mg total) by mouth every 8 (eight) hours as needed for moderate pain. 03/23/18   Law, Bea Graff, PA-C  allopurinol (ZYLOPRIM) 300 MG tablet Take 1 tablet (300 mg total) by mouth daily. 03/16/18   [name], MD  amoxicillin (AMOXIL) 500 MG capsule Take 1 capsule (500 mg total) by mouth 3 (three) times daily. 03/16/18   [name], MD  benzonatate (TESSALON) 100 MG capsule Take 2 capsules (200 mg total) by mouth 3 (three) times daily as needed for cough. 01/29/18   Argentina Donovan, PA-C  cetirizine (ZYRTEC) 10 MG tablet Take 1 tablet (10 mg total) by mouth daily. 11/24/17   [name], MD  fluticasone (FLONASE) 50 MCG/ACT nasal spray Place 2 sprays into both nostrils daily. 01/29/18   Argentina Donovan, PA-C  lisinopril (PRINIVIL,ZESTRIL) 5 MG tablet Take 1 tablet (5 mg total) by mouth daily. 03/16/18  [name], MD  metFORMIN (GLUCOPHAGE) 500 MG tablet Take 1 tablet (500 mg total) by mouth daily with breakfast. 03/16/18   [name], MD  methocarbamol (ROBAXIN) 500 MG tablet Take 1 tablet (500 mg total) by mouth 2 (two) times daily. 03/23/18   Law, Bea Graff, PA-C  pantoprazole (PROTONIX) 40 MG tablet Take 1 tablet (40 mg total) by mouth 2 (two) times daily. 03/16/18   [name], MD  rivaroxaban (XARELTO) 20 MG TABS tablet Take 1 tablet (20 mg total) by mouth daily with supper. 03/16/18   [name], MD  traMADol (ULTRAM) 50 MG tablet Take 1 tablet (50 mg total) by mouth every 12 (twelve) hours as needed. 12/10/17   [name], MD  VIAGRA 50 MG tablet TAKE 1 TABLET BY MOUTH DAILY AS NEEDED FOR ERECTILE DYSFUNCTION. AT LEAST 24 HOURS BETWEEN DOSES 03/08/18   [name], MD    Family History Family History  Problem Relation Age of Onset  . Heart disease Neg Hx   . Kidney disease Neg Hx     Social History Social History   Tobacco Use  . Smoking status: Current Every Day Smoker    Packs/day: 0.25    Types: Cigarettes  . Smokeless tobacco: Never Used  . Tobacco comment: 8 cigs daily  Substance Use Topics  . Alcohol use: No  . Drug use: No     Allergies   Vicodin [hydrocodone-acetaminophen]   Review of Systems Review of Systems  All other systems reviewed and are negative.    Physical Exam Updated Vital Signs BP 125/84   Pulse 94   Temp 98.4 F (36.9 C) (Oral)   Resp 16   SpO2 95%   Physical Exam  Nursing note and vitals reviewed.  Constitutional: Pt appears well-developed and well-nourished. No distress.  HENT:  Head: Normocephalic and atraumatic.  Eyes: Conjunctivae are normal.  Neck: Normal range of motion.  Cardiovascular: Normal rate, regular rhythm. Intact distal pulses.   Capillary refill < 3 sec.  Pulmonary/Chest: Effort normal and breath sounds normal.  Musculoskeletal:  R knee  Pt exhibits mild swelling, medial join line ttp, no bony deformity or abnormality.   ROM: 4/5  Strength: 5/5  Neurological: Pt  is alert. Coordination normal.  Sensation: 5/5 Skin: Skin is warm and dry. Pt is not diaphoretic.  No evidence of open wound or skin tenting Psychiatric: Pt has a normal mood and affect.    ED Treatments / Results  Labs (all labs ordered are listed, but only abnormal results are displayed) Labs Reviewed - No data to display  EKG None  Radiology No results found.  Procedures Procedures (including critical care time)  Medications Ordered in ED Medications - No data to display   Initial Impression / Assessment and Plan / ED Course  I have  reviewed the triage vital signs and the nursing notes.  Pertinent labs & imaging results that were available during my care of the patient were reviewed by me and considered in my medical decision making (see chart for details).        Patient presents with persistent pain to right knee x months.  DDx includes, fracture, strain, or sprain.  Consultants: none  Plain films reveal daughter to tricompartmental arthritis with associated effusion.  Pt advised to follow up with PCP and/or orthopedics. Patient given conservative therapy such as RICE recommended and discussed.   Patient will be discharged home & is agreeable with above plan. Returns precautions discussed. Pt appears  safe for discharge.   Final Clinical Impressions(s) / ED Diagnoses   Final diagnoses:  Chronic pain of right knee    ED Discharge Orders    None       Montine Circle, PA-C 04/16/18 2251    Little, Wenda Overland, MD 04/17/18 1141

## 2018-04-16 NOTE — ED Notes (Signed)
Patient transported to X-ray 

## 2018-04-16 NOTE — ED Triage Notes (Signed)
Pt reports right knee pain, feels like something is pressing on the bone x 3 months.

## 2018-04-16 NOTE — ED Notes (Signed)
Discharge instructions discussed with Pt. Pt verbalized understanding. Pt stable and ambulatory.    

## 2018-04-22 ENCOUNTER — Inpatient Hospital Stay: Payer: BLUE CROSS/BLUE SHIELD | Attending: Hematology and Oncology | Admitting: Internal Medicine

## 2018-04-22 ENCOUNTER — Telehealth: Payer: Self-pay | Admitting: Internal Medicine

## 2018-04-22 ENCOUNTER — Encounter: Payer: Self-pay | Admitting: Gastroenterology

## 2018-04-22 ENCOUNTER — Other Ambulatory Visit: Payer: Self-pay

## 2018-04-22 VITALS — BP 147/97 | HR 89 | Temp 98.2°F | Resp 16 | Ht 69.0 in | Wt 226.0 lb

## 2018-04-22 DIAGNOSIS — Z86718 Personal history of other venous thrombosis and embolism: Secondary | ICD-10-CM | POA: Diagnosis present

## 2018-04-22 DIAGNOSIS — I2699 Other pulmonary embolism without acute cor pulmonale: Secondary | ICD-10-CM

## 2018-04-22 DIAGNOSIS — Z7901 Long term (current) use of anticoagulants: Secondary | ICD-10-CM | POA: Diagnosis not present

## 2018-04-22 DIAGNOSIS — I824Z1 Acute embolism and thrombosis of unspecified deep veins of right distal lower extremity: Secondary | ICD-10-CM

## 2018-04-22 DIAGNOSIS — Z86711 Personal history of pulmonary embolism: Secondary | ICD-10-CM

## 2018-04-22 NOTE — Telephone Encounter (Signed)
Gave avs and calendar ° °

## 2018-04-22 NOTE — Progress Notes (Signed)
Diagnosis Acute pulmonary embolism without acute cor pulmonale, unspecified pulmonary embolism type ([place]) - Plan: AMB referral to orthopedics, CBC with Differential ([place] Only), CMP ([place] only), Lactate dehydrogenase (LDH), VAS [place] LOWER EXTREMITY VENOUS (DVT)  Acute deep vein thrombosis (DVT) of distal vein of right lower extremity ([facility]) - Plan: AMB referral to orthopedics, CBC with Differential ([facility] Only), CMP ([facility] only), Lactate dehydrogenase (LDH), VAS [place] LOWER EXTREMITY VENOUS (DVT)  Staging Cancer Staging No matching staging information was found for the patient.  Assessment and Plan:  1.  PE and DVT.  53 yr old male previously followed by Dr. Audelia Hives.  On October 02, 2017 he presented to the emergency department at [facility] emergency department complaining of increased swelling and pain in the right leg, worsening now over the past several months along with substernal chest pain which he reports began several days prior to his presentation.  On admission CT angiography of the chest was performed the results of which revealed bilateral pulmonary emboli involving the lower lobe and segmental and subsegmental branches.  A 2D echocardiogram was performed on September 2, the results of which revealed no evidence of right strain or cor pulmonale.    Following his hospital discharge he has remained on rivaroxaban.  Pt had Bilateral LE doppler done 11/25/2017 that showed  Summary: Right: Findings consistent with age indeterminate deep vein thrombosis involving the right popliteal vein. Findings consistent with age indeterminate superficial vein thrombosis involving the right small saphenous vein. A cystic structure is found in the  popliteal fossa. Left: No evidence of common femoral vein obstruction.  Hypercoagulable work-up was negative for common etiologies of thrombophilia other than some lab studies that may have been affected by heparin.     Labs done  03/23/2018 reviewed and showed WBC 5.7 Hb 12.3 plts 217,000.  Chemistries WNL with K+ 3.7 Cr 0.94.    CTA of chest done 03/29/2018 reviewed and showed  IMPRESSION: 1. No demonstrable pulmonary embolus. No thoracic aortic aneurysm or dissection.  2.  Bibasilar atelectasis.  No evident edema or consolidation.  3.  No evident adenopathy.  4. Apparent resolution of pancreatitis in the pancreatic tail region since prior CT.  5.  Small hiatal hernia noted.  Bilateral LE dopplers done 03/29/2018 showed  Summary: Right: Findings consistent with age indeterminate deep vein thrombosis involving the right popliteal vein. No cystic structure found in the popliteal fossa. Left: No evidence of common femoral vein obstruction  Despite resolution of PE and due to extent of thrombosis and likely chronic findings in right lower extremity, he will l be recommended for ongoing anticoagulation with Xarelto.   2.  Right knee pain.  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  Will refer to orthopedics for evaluation.  Option for lovenox transition if surgery planned.    3.  Smoking.  Cessation recommended especially due to history of thrombosis.     25 minutes spent with more than 50% spent in review of records, counseling and coordination of care.    Interval History:   Historical data obtained from note dated 12/08/2017.  53 yr old male previously followed by Dr. Audelia Hives.  On October 02, 2017 he presented to the emergency department at [facility] emergency department complaining of increased swelling and pain in the right leg, worsening now over the past several months along with substernal chest pain which he reports began several days prior to his  presentation.  On admission CT angiography of the chest was performed the results of which revealed bilateral pulmonary emboli involving the lower lobe and segmental and subsegmental branches.  A 2D echocardiogram was  performed on September 2, the results of which revealed no evidence of right strain or cor pulmonale.    While hospitalized, he received unfractionated heparin both by bolus and continuous infusion. The laboratory evaluation on August 30 revealed PT/INR 15.3/1.22 PTT 28 Antithrombin III 91 protein C activity 94 protein S activity 40 (63-140%) lupus anticoagulant was affected by the presence of heparin (after heparin neutralization) it normalized; beta-2 glycoprotein 1 IgG <9; beta-2 glycoprotein 1 IgM <9; beta-2 glycoprotein 1 IgA <9; homocystine 13.7; factor V Leiden gene mutation by PCR: Negative; prothrombin gene mutation [id number]: Not identified; anticardiolipin antibodies IgG <9; anticardiolipin antibody IgA <9; anticardiolipin antibody IgM 13 (indeterminate 12-20).   Starting heparin before the vitamin K dependent coagulation studies was felt to have affected the results.  After identifying heparin within the lupus anticoagulant test specimen, it was first neutralized and secondarily identified as a normal lupus anticoagulant.   Following his hospital discharge he has remained on xarelto.   Pt had Bilateral LE doppler done 11/25/2017 that showed:    Summary: Right: Findings consistent with age indeterminate deep vein thrombosis involving the right popliteal vein. Findings consistent with age indeterminate superficial vein thrombosis involving the right small saphenous vein. A cystic structure is found in the  popliteal fossa. Left: No evidence of common femoral vein obstruction.  Current Status:  Pt is seen today for follow-up.  He is here to go over scans and doppler.  He reports right knee pain and reports it is bone on bone.  He remains on Xarelto.    Problem List Patient Active Problem List   Diagnosis Date Noted  . Osteoarthritis of right knee [M17.11] 12/10/2017  . DVT (deep venous thrombosis) ([place]) [I82.409] 10/13/2017  . Pulmonary embolism ([place]) [I26.99] 10/02/2017  . AKI (acute  kidney injury) ([place]) [N17.9] 09/22/2017  . Chest pain [R07.9] 09/22/2017  . Hypertension [I10] 11/19/2016  . Type 2 diabetes mellitus ([place]) [E11.9] 08/19/2016  . Hyperlipidemia [E78.5] 08/19/2016  . Back pain [M54.9] 05/13/2016  . Hammertoe of second toe of right foot [M20.41] 03/06/2016  . Onychomycosis [B35.1] 04/16/2015  . 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 Gout [M10.9] 03/28/2015    Past Medical History Past Medical History:  Diagnosis Date  . Bronchitis   . Bronchitis   . DVT (deep venous thrombosis) ([place])   . Gout   . Hammertoe of second toe of right foot   . Smoker     Past Surgical History Past Surgical History:  Procedure Laterality Date  . HAMMER TOE SURGERY Right 03/10/2016   Procedure: 2ND RIGHT HAMMER TOE CORRECTION;  Surgeon: Landis Martins, DPM;  Location: [facility];  Service: Podiatry;  Laterality: Right;  . MASS EXCISION Right 03/10/2016   Procedure: EXCISION OF CALLUS 2ND RIGHT TOE;  Surgeon: Landis Martins, DPM;  Location: [facility];  Service: Podiatry;  Laterality: Right;  . NO PAST SURGERIES      Family History Family History  Problem Relation Age of Onset  . Heart disease Neg Hx   . Kidney disease Neg Hx      Social History  reports that he has been smoking cigarettes. He has been smoking about 0.25 packs per day. He has never used smokeless tobacco. He reports that he does not drink alcohol or use  drugs.  Medications  Current Outpatient Medications:  .  acetaminophen (TYLENOL) 500 MG tablet, Take 2 tablets (1,000 mg total) by mouth every 8 (eight) hours as needed for moderate pain., Disp: 30 tablet, Rfl: 0 .  allopurinol (ZYLOPRIM) 300 MG tablet, Take 1 tablet (300 mg total) by mouth daily., Disp: 30 tablet, Rfl: 3 .  cetirizine (ZYRTEC) 10 MG tablet, Take 1 tablet (10 mg total) by mouth daily., Disp: 30 tablet, Rfl: 1 .  fluticasone (FLONASE) 50 MCG/ACT nasal spray, Place 2 sprays into both nostrils  daily., Disp: 16 g, Rfl: 6 .  lisinopril (PRINIVIL,ZESTRIL) 5 MG tablet, Take 1 tablet (5 mg total) by mouth daily., Disp: 30 tablet, Rfl: 3 .  metFORMIN (GLUCOPHAGE) 500 MG tablet, Take 1 tablet (500 mg total) by mouth daily with breakfast., Disp: 30 tablet, Rfl: 3 .  methocarbamol (ROBAXIN) 500 MG tablet, Take 1 tablet (500 mg total) by mouth 2 (two) times daily., Disp: 20 tablet, Rfl: 0 .  pantoprazole (PROTONIX) 40 MG tablet, Take 1 tablet (40 mg total) by mouth 2 (two) times daily., Disp: 60 tablet, Rfl: 3 .  rivaroxaban (XARELTO) 20 MG TABS tablet, Take 1 tablet (20 mg total) by mouth daily with supper., Disp: 30 tablet, Rfl: 0 .  VIAGRA 50 MG tablet, TAKE 1 TABLET BY MOUTH DAILY AS NEEDED FOR ERECTILE DYSFUNCTION. AT LEAST 24 HOURS BETWEEN DOSES, Disp: 10 tablet, Rfl: 2  Allergies Vicodin [hydrocodone-acetaminophen]  Review of Systems Review of Systems - Oncology ROS negative   Physical Exam  Vitals Wt Readings from Last 3 Encounters:  04/22/18 226 lb (102.5 kg)  03/16/18 226 lb (102.5 kg)  02/26/18 228 lb 4.8 oz (103.6 kg)   Temp Readings from Last 3 Encounters:  04/22/18 98.2 F (36.8 C) (Oral)  04/16/18 98.4 F (36.9 C) (Oral)  03/23/18 97.7 F (36.5 C) (Oral)   BP Readings from Last 3 Encounters:  04/22/18 (!) 147/97  04/16/18 114/87  03/23/18 (!) 142/66   Pulse Readings from Last 3 Encounters:  04/22/18 89  04/16/18 93  03/23/18 66   Constitutional: Well-developed, well-nourished, and in no distress.   HENT: Head: Normocephalic and atraumatic.  Mouth/Throat: No oropharyngeal exudate. Mucosa moist. Eyes: Pupils are equal, round, and reactive to light. Conjunctivae are normal. No scleral icterus.  Neck: Normal range of motion. Neck supple. No JVD present.  Cardiovascular: Normal rate, regular rhythm and normal heart sounds.  Exam reveals no gallop and no friction rub.   No murmur heard. Pulmonary/Chest: Effort normal and breath sounds normal. No respiratory  distress. No wheezes.No rales.  Abdominal: Soft. Bowel sounds are normal. No distension. There is no tenderness. There is no guarding.  Musculoskeletal: No edema or tenderness.  Lymphadenopathy: No cervical, axillary or supraclavicular adenopathy.  Neurological: Alert and oriented to person, place, and time. No cranial nerve deficit.  Skin: Skin is warm and dry. No rash noted. No erythema. No pallor.  Psychiatric: Affect and judgment normal.   Labs No visits with results within 3 Day(s) from this visit.  Latest known visit with results is:  Admission on 03/22/2018, Discharged on 03/23/2018  Component Date Value Ref Range Status  . Sodium 03/23/2018 140  135 - 145 mmol/L Final  . Potassium 03/23/2018 3.7  3.5 - 5.1 mmol/L Final  . Chloride 03/23/2018 108  98 - 111 mmol/L Final  . CO2 03/23/2018 21* 22 - 32 mmol/L Final  . Glucose, Bld 03/23/2018 119* 70 - 99 mg/dL Final  . BUN 03/23/2018  12  6 - 20 mg/dL Final  . Creatinine, Ser 03/23/2018 0.94  0.61 - 1.24 mg/dL Final  . Calcium 03/23/2018 8.9  8.9 - 10.3 mg/dL Final  . GFR calc non Af Amer 03/23/2018 >60  >60 mL/min Final  . GFR calc Af Amer 03/23/2018 >60  >60 mL/min Final  . Anion gap 03/23/2018 11  5 - 15 Final   Performed at [facility], [place] [address], [place], [place] [address]  . WBC 03/23/2018 5.7  4.0 - 10.5 K/uL Final  . RBC 03/23/2018 4.31  4.22 - 5.81 MIL/uL Final  . Hemoglobin 03/23/2018 12.3* 13.0 - 17.0 g/dL Final  . HCT 03/23/2018 38.2* 39.0 - 52.0 % Final  . MCV 03/23/2018 88.6  80.0 - 100.0 fL Final  . MCH 03/23/2018 28.5  26.0 - 34.0 pg Final  . MCHC 03/23/2018 32.2  30.0 - 36.0 g/dL Final  . RDW 03/23/2018 15.8* 11.5 - 15.5 % Final  . Platelets 03/23/2018 217  150 - 400 K/uL Final  . nRBC 03/23/2018 0.0  0.0 - 0.2 % Final  . Neutrophils Relative % 03/23/2018 40  % Final  . Neutro Abs 03/23/2018 2.3  1.7 - 7.7 K/uL Final  . Lymphocytes Relative 03/23/2018 51  % Final  . Lymphs Abs 03/23/2018 2.9  0.7 -  4.0 K/uL Final  . Monocytes Relative 03/23/2018 6  % Final  . Monocytes Absolute 03/23/2018 0.3  0.1 - 1.0 K/uL Final  . Eosinophils Relative 03/23/2018 3  % Final  . Eosinophils Absolute 03/23/2018 0.2  0.0 - 0.5 K/uL Final  . Basophils Relative 03/23/2018 0  % Final  . Basophils Absolute 03/23/2018 0.0  0.0 - 0.1 K/uL Final  . Immature Granulocytes 03/23/2018 0  % Final  . Abs Immature Granulocytes 03/23/2018 0.01  0.00 - 0.07 K/uL Final   Performed at [facility], [place] [address] [place], [place] [address]     Pathology Orders Placed This Encounter  Procedures  . CBC with Differential ([facility] Only)    Standing Status:   Future    Standing Expiration Date:   04/22/2019  . CMP ([place] only)    Standing Status:   Future    Standing Expiration Date:   04/22/2019  . Lactate dehydrogenase (LDH)    Standing Status:   Future    Standing Expiration Date:   04/22/2019  . AMB referral to orthopedics    Referral Priority:   Routine    Referral Type:   Consultation    Number of Visits Requested:   1       Zoila Shutter MD

## 2018-04-28 MED FILL — ALLOPURINOL 300 MG TAB: 300 | 90 days supply | Qty: 90 | Fill #1

## 2018-04-28 MED FILL — LISINOPRIL 5 MG TAB: 5 | 90 days supply | Qty: 90 | Fill #1

## 2018-04-28 MED FILL — PANTOPRAZOLE SOD DR 40 MG T: 40 | 90 days supply | Qty: 180 | Fill #0

## 2018-05-03 ENCOUNTER — Other Ambulatory Visit: Payer: Self-pay | Admitting: Family Medicine

## 2018-05-03 DIAGNOSIS — I2699 Other pulmonary embolism without acute cor pulmonale: Secondary | ICD-10-CM

## 2018-05-03 MED ORDER — CETIRIZINE HCL 10 MG PO TABS: 10.0000 mg | ORAL_TABLET | Freq: Every day | ORAL | 2 refills | Status: DC

## 2018-05-03 NOTE — Telephone Encounter (Signed)
1) Medication(s) Requested (by name): methocarbamol (ROBAXIN) 500 MG tablet  cetirizine (ZYRTEC) 10 MG tablet   2) Pharmacy of Choice: [facility]  3) Special Requests: Pt verified address is current for med delivery  [place] [address] Approved medications will be sent to the pharmacy, we will reach out if there is an issue.  Requests made after 3pm may not be addressed until the following business day!  If a patient is unsure of the name of the medication(s) please note and ask patient to call back when they are able to provide all info, do not send to responsible party until all information is available!

## 2018-05-04 ENCOUNTER — Other Ambulatory Visit: Payer: Self-pay | Admitting: Pharmacist

## 2018-05-04 MED ORDER — METHOCARBAMOL 500 MG PO TABS: 500.0000 mg | ORAL_TABLET | Freq: Two times a day (BID) | ORAL | 0 refills | Status: DC

## 2018-05-04 MED FILL — METHOCARBAMOL 500 MG TABS: 500 | 10 days supply | Qty: 20 | Fill #0

## 2018-05-04 MED FILL — XARELTO 20 MG TABLET: 20 | 30 days supply | Qty: 30 | Fill #0

## 2018-05-04 NOTE — Progress Notes (Signed)
Opened in error

## 2018-05-13 ENCOUNTER — Inpatient Hospital Stay: Payer: BLUE CROSS/BLUE SHIELD | Admitting: Family Medicine

## 2018-05-13 MED FILL — metFORMIN HCL 500 MG TABS: 500 | 30 days supply | Qty: 30 | Fill #1

## 2018-05-18 ENCOUNTER — Telehealth: Payer: Self-pay | Admitting: Family Medicine

## 2018-05-18 DIAGNOSIS — M1711 Unilateral primary osteoarthritis, right knee: Secondary | ICD-10-CM

## 2018-05-18 NOTE — Telephone Encounter (Signed)
Patient called because they would like to get a orthopedic referral because they are in pain. Please follow up.

## 2018-05-19 NOTE — Telephone Encounter (Signed)
Patient was called to obtain more information for referral. A voicemail was left informing patient to return phone call.

## 2018-05-19 NOTE — Telephone Encounter (Signed)
Follow up ° °Pt returned call  °

## 2018-05-20 ENCOUNTER — Ambulatory Visit: Payer: BLUE CROSS/BLUE SHIELD | Attending: Family Medicine | Admitting: Primary Care

## 2018-05-20 ENCOUNTER — Telehealth: Payer: Self-pay | Admitting: Family Medicine

## 2018-05-20 ENCOUNTER — Encounter: Payer: Self-pay | Admitting: Primary Care

## 2018-05-20 ENCOUNTER — Other Ambulatory Visit: Payer: Self-pay

## 2018-05-20 DIAGNOSIS — I82401 Acute embolism and thrombosis of unspecified deep veins of right lower extremity: Secondary | ICD-10-CM

## 2018-05-20 DIAGNOSIS — M1711 Unilateral primary osteoarthritis, right knee: Secondary | ICD-10-CM | POA: Diagnosis not present

## 2018-05-20 DIAGNOSIS — I2699 Other pulmonary embolism without acute cor pulmonale: Secondary | ICD-10-CM | POA: Diagnosis not present

## 2018-05-20 DIAGNOSIS — I10 Essential (primary) hypertension: Secondary | ICD-10-CM

## 2018-05-20 NOTE — Telephone Encounter (Signed)
Patient was called and states that he is having knee pain and needs a referral to ortho.

## 2018-05-20 NOTE — Progress Notes (Signed)
Patient verified DOB Patient has taken tylenol arthritis medication for pain.

## 2018-05-20 NOTE — Progress Notes (Signed)
Subjective:  Patient ID: Terry Poole, male    DOB: [birth year]  Age: 53 y.o. MRN: [id number]  CC: Knee Pain   HPI: Patient had a tele visit due to COVID-19. He is followed by oncology for a hx of DVT and PE on  Xarelto . He major problem and concern is knee pain which is interfering in his ADL's unable to walk or stand for periods of the time or climb stairs. He is requesting a referral to a orthopedist for knee surgery x-rays were shown to him that his left knee had no cartilage and is bone on bone. I explained to him that I will place another referral and let the coordinator know the orthopedist has to take [place]. If surgery is required I explained to him that his oncologist must also be on board. Time spent 1hr.  Outpatient Medications Prior to Visit  Medication Sig Dispense Refill  . acetaminophen (TYLENOL) 500 MG tablet Take 2 tablets (1,000 mg total) by mouth every 8 (eight) hours as needed for moderate pain. 30 tablet 0  . allopurinol (ZYLOPRIM) 300 MG tablet Take 1 tablet (300 mg total) by mouth daily. 30 tablet 3  . cetirizine (ZYRTEC) 10 MG tablet Take 1 tablet (10 mg total) by mouth daily. 30 tablet 2  . fluticasone (FLONASE) 50 MCG/ACT nasal spray Place 2 sprays into both nostrils daily. 16 g 6  . lisinopril (PRINIVIL,ZESTRIL) 5 MG tablet Take 1 tablet (5 mg total) by mouth daily. 30 tablet 3  . metFORMIN (GLUCOPHAGE) 500 MG tablet Take 1 tablet (500 mg total) by mouth daily with breakfast. 30 tablet 3  . methocarbamol (ROBAXIN) 500 MG tablet Take 1 tablet (500 mg total) by mouth 2 (two) times daily. 20 tablet 0  . pantoprazole (PROTONIX) 40 MG tablet Take 1 tablet (40 mg total) by mouth 2 (two) times daily. 60 tablet 3  . VIAGRA 50 MG tablet TAKE 1 TABLET BY MOUTH DAILY AS NEEDED FOR ERECTILE DYSFUNCTION. AT LEAST 24 HOURS BETWEEN DOSES 10 tablet 2  . XARELTO 20 MG TABS tablet TAKE 1 TABLET BY MOUTH DAILY WITH SUPPER. 30 tablet 2   No facility-administered medications prior to  visit.     ROS Review of Systems  Constitutional: Negative.   HENT: Negative.   Eyes: Negative.   Respiratory: Negative.   Gastrointestinal: Negative.   Endocrine: Negative.   Genitourinary: Negative.   Musculoskeletal:       Knee pain  Skin: Negative.   Allergic/Immunologic: Negative.   Neurological: Positive for weakness.    Objective:  There were no vitals taken for this visit.  BP/Weight 04/22/2018 04/16/2018 8/54/6270  Systolic BP 350 093 818  Diastolic BP 97 87 66  Wt. (Lbs) 226 - -  BMI 33.37 - -     Physical Exam   Assessment & Plan:  Bayan was seen today for knee pain.  Diagnoses and all orders for this visit:  Osteoarthritis of right knee, unspecified osteoarthritis type  The incident occurred less than 1 hour ago. There was no injury mechanism. The pain is present in the left knee. The quality of the pain is described as aching, shooting and stabbing. The pain is at a severity of 10/10. The pain is severe. The pain has been constant since onset. Associated symptoms include an inability to bear weight and muscle weakness. The symptoms are aggravated by movement. He has tried acetaminophen, elevation, immobilization and non-weight bearing for the symptoms. The treatment provided no relief.  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  superficial vein thrombosis involving the right small saphenous vein. A cystic structure is found in the popliteal fossa. Followed by oncology Dr. Walden Field  Pulmonary embolism, other, unspecified chronicity, unspecified whether acute cor pulmonale present ([place]) 04/22/2018 CT angiography of the chest was performed the results of which revealed bilateral pulmonary emboli involving the lower lobe and segmental  and subsegmental branches Followed by oncology Dr. Walden Field  There are no diagnoses linked to this encounter.  No orders of the defined types were placed in this encounter.   Follow-up: Return in about 3 months (around 08/19/2018) for knee pain .   Kerin Perna NP

## 2018-05-20 NOTE — Telephone Encounter (Signed)
Done

## 2018-05-20 NOTE — Telephone Encounter (Signed)
Patient called requesting a letter for his job so he can be released and can return to work. Please follow up.

## 2018-05-24 ENCOUNTER — Telehealth: Payer: Self-pay | Admitting: Family Medicine

## 2018-05-24 MED FILL — MELOXICAM 15 MG TABLET: 15 | 30 days supply | Qty: 30 | Fill #0

## 2018-05-24 NOTE — Telephone Encounter (Signed)
Letter is ready for pick-up.

## 2018-05-24 NOTE — Telephone Encounter (Signed)
Pt called in stated he wanted to speak with his DR did not state reason please follow up

## 2018-05-24 NOTE — Telephone Encounter (Signed)
Patient was called and patient informed me that he has seen Ortho and he is having a knee replacement. Patient was informed that ortho will fax over a surgery clearance form for PCP to fill out and fax back.

## 2018-05-24 NOTE — Telephone Encounter (Signed)
Patient has been called and informed of letter being ready for pick up.

## 2018-06-07 MED FILL — XARELTO 20 MG TABLET: 20 | 30 days supply | Qty: 30 | Fill #1

## 2018-06-09 ENCOUNTER — Telehealth: Payer: Self-pay | Admitting: Family Medicine

## 2018-06-10 ENCOUNTER — Ambulatory Visit: Payer: BLUE CROSS/BLUE SHIELD | Attending: Family Medicine | Admitting: Physician Assistant

## 2018-06-10 ENCOUNTER — Other Ambulatory Visit: Payer: Self-pay

## 2018-06-10 DIAGNOSIS — E119 Type 2 diabetes mellitus without complications: Secondary | ICD-10-CM | POA: Diagnosis not present

## 2018-06-10 DIAGNOSIS — J329 Chronic sinusitis, unspecified: Secondary | ICD-10-CM | POA: Diagnosis not present

## 2018-06-10 MED ORDER — FLUTICASONE PROPIONATE 50 MCG/ACT NA SUSP: 2.0000 | Freq: Every day | NASAL | 6 refills | Status: DC

## 2018-06-10 MED ORDER — AMOXICILLIN 500 MG PO CAPS: 500.0000 mg | ORAL_CAPSULE | Freq: Three times a day (TID) | ORAL | 0 refills | Status: DC

## 2018-06-10 MED FILL — FLUTICASONE PROP 50 MCG SPR: 50 | 30 days supply | Qty: 16 | Fill #0

## 2018-06-10 MED FILL — AMOXICILLIN 500 MG CAPSULE: 500 | 10 days supply | Qty: 30 | Fill #0

## 2018-06-10 NOTE — Progress Notes (Signed)
Virtual Visit via Telephone Note  I connected with Terry Poole on 06/10/18 at  2:10 PM EDT by telephone and verified that I am speaking with the correct person using two identifiers.   I discussed the limitations, risks, security and privacy concerns of performing an evaluation and management service by telephone and the availability of in person appointments. I also discussed with the patient that there may be a patient responsible charge related to this service. The patient expressed understanding and agreed to proceed.  Patient location:  home My Location:  [place] office Persons on the call:  Myself and the patient.    History of Present Illness: Sinus congestion and drainage.  +PND.  No cough.  Mucus is thick and sometimes greenish brown.  No fever.  flonase hasn't helped.  Sinuses feel pain and pressure  No exposure to Covid.  No travel.     Blood sugars running ~100    Observations/Objective:  A&Ox3   Assessment and Plan: 1. Sinusitis, unspecified chronicity, unspecified location - amoxicillin (AMOXIL) 500 MG capsule; Take 1 capsule (500 mg total) by mouth 3 (three) times daily.  Dispense: 30 capsule; Refill: 0 - fluticasone (FLONASE) 50 MCG/ACT nasal spray; Place 2 sprays into both nostrils daily.  Dispense: 16 g; Refill: 6 Keep quarantine/Covid precautions.    2. Type 2 diabetes mellitus without complication, without long-term current use of insulin (HCC) Controlled, continue current regimen.  Follow diabetic diet.   Follow Up Instructions: 2-3 months with PCP;  Sooner if worse   I discussed the assessment and treatment plan with the patient. The patient was provided an opportunity to ask questions and all were answered. The patient agreed with the plan and demonstrated an understanding of the instructions.   The patient was advised to call back or seek an in-person evaluation if the symptoms worsen or if the condition fails to improve as anticipated.  I provided 8  minutes of non-face-to-face time during this encounter.   Freeman Caldron, PA-C

## 2018-06-10 NOTE — Progress Notes (Signed)
Called patient to initiate their telephone visit with provider Freeman Caldron, PA. Verified date of birth. Patient has c/o postnasal drainage, nasal congestion, headaches, sinus pain/pressure x 3 weeks. No known sick contact. Has taken OTC meds w/no relief. KWalker, CMA.

## 2018-06-29 ENCOUNTER — Other Ambulatory Visit: Payer: Self-pay | Admitting: Family Medicine

## 2018-06-29 DIAGNOSIS — M1A0720 Idiopathic chronic gout, left ankle and foot, without tophus (tophi): Secondary | ICD-10-CM

## 2018-07-06 MED FILL — XARELTO 20 MG TABLET: 20 | 30 days supply | Qty: 30 | Fill #2

## 2018-07-06 MED FILL — LISINOPRIL 5 MG TAB: 5 | 30 days supply | Qty: 30 | Fill #1

## 2018-07-13 ENCOUNTER — Telehealth: Payer: Self-pay | Admitting: Family Medicine

## 2018-07-13 NOTE — Telephone Encounter (Signed)
New Message  Pt states he is having blood clots in both legs and would like to speak with someone

## 2018-07-13 NOTE — Telephone Encounter (Signed)
Pt states he has pain in both legs and request imaging. Taking Tylenol 500 mg.  Left leg, mid ways down, stays sore a lot.  Sx's x 2 weeks States he noticed swelling and pain Continues to take Xarelto 20 mg daily   Denies SOB, new onset of intermittent  pain in chest with lying or walking around 8. He states it's d/t stress and feels like a strain and pressure on his chest at times.   Encouraged patient to go to the ED and to f/u with PCP after evaluation. Patient states he has someone to take him does not need for nurse to call 911.

## 2018-07-14 ENCOUNTER — Emergency Department (HOSPITAL_BASED_OUTPATIENT_CLINIC_OR_DEPARTMENT_OTHER): Payer: BLUE CROSS/BLUE SHIELD

## 2018-07-14 ENCOUNTER — Emergency Department (HOSPITAL_COMMUNITY): Payer: BLUE CROSS/BLUE SHIELD

## 2018-07-14 ENCOUNTER — Other Ambulatory Visit: Payer: Self-pay

## 2018-07-14 ENCOUNTER — Emergency Department (HOSPITAL_COMMUNITY)
Admission: EM | Admit: 2018-07-14 | Discharge: 2018-07-14 | Disposition: A | Discharge status: Home / Self Care | Payer: BLUE CROSS/BLUE SHIELD | Attending: Emergency Medicine | Admitting: Emergency Medicine

## 2018-07-14 ENCOUNTER — Encounter (HOSPITAL_COMMUNITY): Payer: Self-pay | Admitting: Emergency Medicine

## 2018-07-14 DIAGNOSIS — M7989 Other specified soft tissue disorders: Secondary | ICD-10-CM | POA: Diagnosis not present

## 2018-07-14 DIAGNOSIS — Z7901 Long term (current) use of anticoagulants: Secondary | ICD-10-CM | POA: Diagnosis not present

## 2018-07-14 DIAGNOSIS — Z86711 Personal history of pulmonary embolism: Secondary | ICD-10-CM | POA: Diagnosis not present

## 2018-07-14 DIAGNOSIS — R0789 Other chest pain: Secondary | ICD-10-CM | POA: Insufficient documentation

## 2018-07-14 DIAGNOSIS — R0602 Shortness of breath: Secondary | ICD-10-CM | POA: Insufficient documentation

## 2018-07-14 DIAGNOSIS — Z86718 Personal history of other venous thrombosis and embolism: Secondary | ICD-10-CM | POA: Insufficient documentation

## 2018-07-14 DIAGNOSIS — E119 Type 2 diabetes mellitus without complications: Secondary | ICD-10-CM | POA: Insufficient documentation

## 2018-07-14 DIAGNOSIS — Z7984 Long term (current) use of oral hypoglycemic drugs: Secondary | ICD-10-CM | POA: Diagnosis not present

## 2018-07-14 DIAGNOSIS — I10 Essential (primary) hypertension: Secondary | ICD-10-CM | POA: Diagnosis not present

## 2018-07-14 DIAGNOSIS — Z79899 Other long term (current) drug therapy: Secondary | ICD-10-CM | POA: Diagnosis not present

## 2018-07-14 DIAGNOSIS — F17210 Nicotine dependence, cigarettes, uncomplicated: Secondary | ICD-10-CM | POA: Insufficient documentation

## 2018-07-14 LAB — CBC
HCT: 37.3 % — ABNORMAL LOW (ref 39.0–52.0)
Hemoglobin: 12.1 g/dL — ABNORMAL LOW (ref 13.0–17.0)
MCH: 28.5 pg (ref 26.0–34.0)
MCHC: 32.4 g/dL (ref 30.0–36.0)
MCV: 88 fL (ref 80.0–100.0)
Platelets: 226 10*3/uL (ref 150–400)
RBC: 4.24 MIL/uL (ref 4.22–5.81)
RDW: 14.4 % (ref 11.5–15.5)
WBC: 5.5 10*3/uL (ref 4.0–10.5)
nRBC: 0 % (ref 0.0–0.2)

## 2018-07-14 LAB — BASIC METABOLIC PANEL
Anion gap: 10 (ref 5–15)
BUN: 11 mg/dL (ref 6–20)
CO2: 21 mmol/L — ABNORMAL LOW (ref 22–32)
Calcium: 9 mg/dL (ref 8.9–10.3)
Chloride: 112 mmol/L — ABNORMAL HIGH (ref 98–111)
Creatinine, Ser: 1.19 mg/dL (ref 0.61–1.24)
GFR calc Af Amer: >60 mL/min (ref >60)
GFR calc non Af Amer: >60 mL/min (ref >60)
Glucose, Bld: 116 mg/dL — ABNORMAL HIGH (ref 70–99)
Potassium: 3.5 mmol/L (ref 3.5–5.1)
Sodium: 143 mmol/L (ref 135–145)

## 2018-07-14 LAB — PROTIME-INR
INR: 1.8 — ABNORMAL HIGH (ref 0.8–1.2)
Prothrombin Time: 20.4 seconds — ABNORMAL HIGH (ref 11.4–15.2)

## 2018-07-14 LAB — BRAIN NATRIURETIC PEPTIDE: B Natriuretic Peptide: 11.3 pg/mL (ref 0.0–100.0)

## 2018-07-14 LAB — TROPONIN I: Troponin I: <0.03 ng/mL (ref <0.03)

## 2018-07-14 MED ORDER — SODIUM CHLORIDE 0.9% FLUSH: 3.0000 mL | Freq: Once | INTRAVENOUS | Status: DC

## 2018-07-14 MED ORDER — IOHEXOL 350 MG/ML SOLN
100.0000 mL | Freq: Once | INTRAVENOUS | Status: AC | PRN
  Administered 2018-07-14: 100 mL via INTRAVENOUS

## 2018-07-14 MED FILL — ALLOPURINOL 300 MG TAB: 300 | 30 days supply | Qty: 30 | Fill #0

## 2018-07-14 MED FILL — MELOXICAM 15 MG TABLET: 15 | 30 days supply | Qty: 30 | Fill #0

## 2018-07-14 NOTE — Discharge Instructions (Signed)
You have been evaluated for legs swelling and chest pain.  Fortunately no evidence of new blood clots or infection causing your symptoms.  Continue taking Xarelto as prescribe.  Call and follow up closely with your doctor for further care.  Return if you have any concerns.

## 2018-07-14 NOTE — ED Notes (Addendum)
Pt ambulated without assistance in the hallway. Pts O2 sats remained at 96% on room air.

## 2018-07-14 NOTE — Progress Notes (Signed)
Bilateral lower extremity venous duplex completed. Refer to "CV Proc" under chart review to view preliminary results.  Preliminary results discussed with Domenic Moras, PA-C.  07/14/2018 1:36 PM Maudry Mayhew, MHA, RVT, RDCS, RDMS

## 2018-07-14 NOTE — ED Triage Notes (Addendum)
Pt in with central cp, pain radiates to R shoulder x few wks. C/o n/v and sob only when lying down. Hx of DVT's and PE's, takes Xarelto

## 2018-07-14 NOTE — ED Notes (Signed)
Patient verbalizes understanding of discharge instructions. Opportunity for questioning and answers were provided. Armband removed by staff, pt discharged from ED ambulatory to home.  
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
## 2018-07-27 ENCOUNTER — Other Ambulatory Visit: Payer: Self-pay

## 2018-07-27 ENCOUNTER — Ambulatory Visit: Payer: BLUE CROSS/BLUE SHIELD | Attending: Family Medicine | Admitting: Family Medicine

## 2018-07-27 ENCOUNTER — Encounter: Payer: Self-pay | Admitting: Family Medicine

## 2018-07-27 VITALS — BP 156/85 | HR 80 | Temp 98.1°F | Ht 69.0 in | Wt 230.0 lb

## 2018-07-27 DIAGNOSIS — I10 Essential (primary) hypertension: Secondary | ICD-10-CM

## 2018-07-27 DIAGNOSIS — E119 Type 2 diabetes mellitus without complications: Secondary | ICD-10-CM | POA: Diagnosis not present

## 2018-07-27 DIAGNOSIS — M1711 Unilateral primary osteoarthritis, right knee: Secondary | ICD-10-CM | POA: Diagnosis not present

## 2018-07-27 DIAGNOSIS — M1A0720 Idiopathic chronic gout, left ankle and foot, without tophus (tophi): Secondary | ICD-10-CM | POA: Diagnosis not present

## 2018-07-27 DIAGNOSIS — R600 Localized edema: Secondary | ICD-10-CM

## 2018-07-27 DIAGNOSIS — Z1211 Encounter for screening for malignant neoplasm of colon: Secondary | ICD-10-CM

## 2018-07-27 DIAGNOSIS — I2699 Other pulmonary embolism without acute cor pulmonale: Secondary | ICD-10-CM

## 2018-07-27 LAB — GLUCOSE, POCT (MANUAL RESULT ENTRY): POC Glucose: 111 mg/dl — AB (ref 70–99)

## 2018-07-27 LAB — POCT GLYCOSYLATED HEMOGLOBIN (HGB A1C): HbA1c, POC (controlled diabetic range): 6.6 % (ref 0.0–7.0)

## 2018-07-27 MED ORDER — METHOCARBAMOL 500 MG PO TABS: 500.0000 mg | ORAL_TABLET | Freq: Two times a day (BID) | ORAL | 0 refills | Status: DC

## 2018-07-27 MED ORDER — FUROSEMIDE 20 MG PO TABS: 20.0000 mg | ORAL_TABLET | Freq: Every day | ORAL | 0 refills | Status: DC

## 2018-07-27 MED ORDER — COLCHICINE 0.6 MG PO TABS: ORAL_TABLET | ORAL | 1 refills | Status: DC

## 2018-07-27 MED ORDER — METFORMIN HCL 500 MG PO TABS: 500.0000 mg | ORAL_TABLET | Freq: Every day | ORAL | 3 refills | Status: DC

## 2018-07-27 MED ORDER — ATORVASTATIN CALCIUM 40 MG PO TABS: 40.0000 mg | ORAL_TABLET | Freq: Every day | ORAL | 3 refills | Status: DC

## 2018-07-27 MED ORDER — LISINOPRIL 5 MG PO TABS: 5.0000 mg | ORAL_TABLET | Freq: Every day | ORAL | 3 refills | Status: DC

## 2018-07-27 MED ORDER — TRAMADOL HCL 50 MG PO TABS: 50.0000 mg | ORAL_TABLET | Freq: Two times a day (BID) | ORAL | 0 refills | Status: AC | PRN

## 2018-07-27 MED FILL — FUROSEMIDE 20 MG TABS: 20 | 5 days supply | Qty: 5 | Fill #0

## 2018-07-27 MED FILL — metFORMIN HCL 500 MG TABS: 500 | 30 days supply | Qty: 30 | Fill #0

## 2018-07-27 MED FILL — traMADol HCL 50 MG TABS: 50 | 7 days supply | Qty: 14 | Fill #0

## 2018-07-27 MED FILL — ATORVASTATIN CALCIUM 40 MG: 40 | 30 days supply | Qty: 30 | Fill #0

## 2018-07-27 MED FILL — MITIGARE 0.6 MG CAPSULE: 0.6 | 15 days supply | Qty: 30 | Fill #0

## 2018-07-27 MED FILL — METHOCARBAMOL 500 MG TABS: 500 | 10 days supply | Qty: 20 | Fill #0

## 2018-07-27 NOTE — Patient Instructions (Signed)

## 2018-07-27 NOTE — Progress Notes (Signed)
Subjective:  Patient ID: Terry Poole, male    DOB: 06/25/1965  Age: 53 y.o. MRN: [id number]  CC: Diabetes and Gout   HPI Alphonsa Brickle is a 53 year old male with a history of type 2 diabetes mellitus (A1c 6.5) Hyperlipidemia, hypertension, gout, bilateral PE and right lower extremity DVT (diagnosed in 09/2017) who presents today for a follow-up visit.  He complains of bilateral pedal edema which worsened in the dependent position and is slightly improved after elevation.  He also has chronic right knee pain and informs me he was told he would need a right knee replacement however collaboration will be needed between his orthopedics and hematologist regarding his anticoagulation.  Due to the above he has quit working as his job entails prolonged standing and is filing for disability and wonders if I can write a letter stating he is disabled based on his gout and his knee pain.  I have informed him his gout does not make him disabled however his osteoarthritis is being managed by his orthopedics who could speak to his being disabled or not. He denies dyspnea, weight gain, orthopnea, paroxysmal nocturnal dyspnea. He thinks his pedal edema is due to a gout flare as he informs me he has a gout flare in both legs but denies swelling of any particular joint or redness.  Denies ingestion of red meats, wine.  He is doing well on his Xarelto and denies bleeding or bruising.  Currently followed by hematology. His blood pressure is elevated and he endorses taking his antihypertensive.  Currently on metformin for diabetes and he is due for an eye exam. Quit smoking 3 weeks ago and has smoked half a pack of cigarettes since he was about 53 years old.  He has been commended on quitting.  Past Medical History:  Diagnosis Date  . Bronchitis   . Bronchitis   . DVT (deep venous thrombosis) ([place])   . Gout   . Hammertoe of second toe of right foot   . Smoker     Past Surgical History:  Procedure Laterality  Date  . HAMMER TOE SURGERY Right 03/10/2016   Procedure: 2ND RIGHT HAMMER TOE CORRECTION;  Surgeon: Landis Martins, DPM;  Location: [facility];  Service: Podiatry;  Laterality: Right;  . MASS EXCISION Right 03/10/2016   Procedure: EXCISION OF CALLUS 2ND RIGHT TOE;  Surgeon: Landis Martins, DPM;  Location: [facility];  Service: Podiatry;  Laterality: Right;  . NO PAST SURGERIES      Family History  Problem Relation Age of Onset  . Heart disease Neg Hx   . Kidney disease Neg Hx     Allergies  Allergen Reactions  . Vicodin [Hydrocodone-Acetaminophen] Hives    Patient stated he is no longer allergic to vicodin, patient stated that he was allergic about 12 years ago.     Outpatient Medications Prior to Visit  Medication Sig Dispense Refill  . acetaminophen (TYLENOL) 500 MG tablet Take 2 tablets (1,000 mg total) by mouth every 8 (eight) hours as needed for moderate pain. 30 tablet 0  . allopurinol (ZYLOPRIM) 300 MG tablet TAKE 1 TABLET (300 MG TOTAL) BY MOUTH DAILY. 30 tablet 3  . cetirizine (ZYRTEC) 10 MG tablet Take 1 tablet (10 mg total) by mouth daily. 30 tablet 2  . fluticasone (FLONASE) 50 MCG/ACT nasal spray Place 2 sprays into both nostrils daily. 16 g 6  . pantoprazole (PROTONIX) 40 MG tablet Take 1 tablet (40 mg total) by mouth 2 (two)  times daily. 60 tablet 3  . VIAGRA 50 MG tablet TAKE 1 TABLET BY MOUTH DAILY AS NEEDED FOR ERECTILE DYSFUNCTION. AT LEAST 24 HOURS BETWEEN DOSES (Patient taking differently: Take 50 mg by mouth as needed for erectile dysfunction. ) 10 tablet 2  . XARELTO 20 MG TABS tablet TAKE 1 TABLET BY MOUTH DAILY WITH SUPPER. (Patient taking differently: Take 20 mg by mouth daily with breakfast. ) 30 tablet 2  . lisinopril (PRINIVIL,ZESTRIL) 5 MG tablet Take 1 tablet (5 mg total) by mouth daily. 30 tablet 3  . metFORMIN (GLUCOPHAGE) 500 MG tablet Take 1 tablet (500 mg total) by mouth daily with breakfast. 30 tablet 3  . amoxicillin  (AMOXIL) 500 MG capsule Take 1 capsule (500 mg total) by mouth 3 (three) times daily. (Patient not taking: Reported on 07/14/2018) 30 capsule 0  . methocarbamol (ROBAXIN) 500 MG tablet Take 1 tablet (500 mg total) by mouth 2 (two) times daily. (Patient not taking: Reported on 07/14/2018) 20 tablet 0   No facility-administered medications prior to visit.      ROS Review of Systems  Constitutional: Negative for activity change and appetite change.  HENT: Negative for sinus pressure and sore throat.   Eyes: Negative for visual disturbance.  Respiratory: Negative for cough, chest tightness and shortness of breath.   Cardiovascular: Positive for leg swelling. Negative for chest pain.  Gastrointestinal: Negative for abdominal distention, abdominal pain, constipation and diarrhea.  Endocrine: Negative.   Genitourinary: Negative for dysuria.  Musculoskeletal:       See hpi  Skin: Negative for rash.  Allergic/Immunologic: Negative.   Neurological: Negative for weakness, light-headedness and numbness.  Psychiatric/Behavioral: Negative for dysphoric mood and suicidal ideas.    Objective:  BP (!) 156/85   Pulse 80   Temp 98.1 F (36.7 C) (Oral)   Ht 5\' 9"  (1.753 m)   Wt 230 lb (104.3 kg)   SpO2 98%   BMI 33.97 kg/m   BP/Weight 07/27/2018 07/14/2018 2/53/6644  Systolic BP 034 742 595  Diastolic BP 85 95 97  Wt. (Lbs) 230 225.97 226  BMI 33.97 33.37 33.37      Physical Exam Constitutional:      Appearance: He is well-developed.  Cardiovascular:     Rate and Rhythm: Normal rate.     Heart sounds: Normal heart sounds. No murmur.  Pulmonary:     Effort: Pulmonary effort is normal.     Breath sounds: Normal breath sounds. No wheezing or rales.  Chest:     Chest wall: No tenderness.  Abdominal:     General: Bowel sounds are normal. There is no distension.     Palpations: Abdomen is soft. There is no mass.     Tenderness: There is no abdominal tenderness.  Musculoskeletal: Normal  range of motion.     Right lower leg: Edema (1+) present.     Left lower leg: Edema (1+) present.     Comments: No tenderness on palpation of joints of the toe or ankle Slight right knee tenderness on range of motion  Neurological:     Mental Status: He is alert and oriented to person, place, and time.   Sensory exam of the foot is normal, tested with the monofilament. Good pulses, no lesions or ulcers, good peripheral pulses.   CMP Latest Ref Rng & Units 07/14/2018 03/23/2018 02/26/2018  Glucose 70 - 99 mg/dL 116(H) 119(H) 157(H)  BUN 6 - 20 mg/dL 11 12 19   Creatinine 0.61 - 1.24 mg/dL  1.19 0.94 1.20  Sodium 135 - 145 mmol/L 143 140 144  Potassium 3.5 - 5.1 mmol/L 3.5 3.7 4.1  Chloride 98 - 111 mmol/L 112(H) 108 110  CO2 22 - 32 mmol/L 21(L) 21(L) 24  Calcium 8.9 - 10.3 mg/dL 9.0 8.9 9.0  Total Protein 6.5 - 8.1 g/dL - - 7.4  Total Bilirubin 0.3 - 1.2 mg/dL - - 0.4  Alkaline Phos 38 - 126 U/L - - 136(H)  AST 15 - 41 U/L - - 20  ALT 0 - 44 U/L - - 35    Lipid Panel     Component Value Date/Time   CHOL 126 06/11/2017 0918   TRIG 105 06/11/2017 0918   HDL 48 06/11/2017 0918   CHOLHDL 2.6 06/11/2017 0918   CHOLHDL 2.6 09/21/2015 1053   VLDL 47 (H) 09/21/2015 1053   LDLCALC 57 06/11/2017 0918    CBC    Component Value Date/Time   WBC 5.5 07/14/2018 1039   RBC 4.24 07/14/2018 1039   HGB 12.1 (L) 07/14/2018 1039   HGB 12.9 (L) 02/26/2018 0936   HGB 14.0 09/24/2017 1106   HCT 37.3 (L) 07/14/2018 1039   HCT 43.6 09/24/2017 1106   PLT 226 07/14/2018 1039   PLT 247 02/26/2018 0936   PLT 205 09/24/2017 1106   MCV 88.0 07/14/2018 1039   MCV 86 09/24/2017 1106   MCH 28.5 07/14/2018 1039   MCHC 32.4 07/14/2018 1039   RDW 14.4 07/14/2018 1039   RDW 15.1 09/24/2017 1106   LYMPHSABS 2.9 03/23/2018 0900   LYMPHSABS 2.3 09/24/2017 1106   MONOABS 0.3 03/23/2018 0900   EOSABS 0.2 03/23/2018 0900   EOSABS 4.2 (H) 09/24/2017 1106   BASOSABS 0.0 03/23/2018 0900   BASOSABS 0.0  09/24/2017 1106    Lab Results  Component Value Date   HGBA1C 6.6 07/27/2018    Assessment & Plan:   1. Type 2 diabetes mellitus without complication, without long-term current use of insulin (HCC) A1c of 6.6 Continue current regimen Counseled on Diabetic diet, my plate method, 924 minutes of moderate intensity exercise/week Keep blood sugar logs with fasting goals of 80-120 mg/dl, random of less than 180 and in the event of sugars less than 60 mg/dl or greater than 400 mg/dl please notify the clinic ASAP. It is recommended that you undergo annual eye exams and annual foot exams. Pneumonia vaccine is recommended. - POCT glucose (manual entry) - POCT glycosylated hemoglobin (Hb A1C) - Ambulatory referral to Ophthalmology - atorvastatin (LIPITOR) 40 MG tablet; Take 1 tablet (40 mg total) by mouth daily.  Dispense: 30 tablet; Refill: 3 - Lipid panel - Microalbumin / creatinine urine ratio - metFORMIN (GLUCOPHAGE) 500 MG tablet; Take 1 tablet (500 mg total) by mouth daily with breakfast.  Dispense: 30 tablet; Refill: 3  2. Essential hypertension Uncontrolled Would love to place on hydrochlorothiazide but this could precipitate his gout If blood pressure is elevated at next visit I will increase dose of lisinopril Counseled on blood pressure goal of less than 130/80, low-sodium, DASH diet, medication compliance, 150 minutes of moderate intensity exercise per week. Discussed medication compliance, adverse effects. - lisinopril (ZESTRIL) 5 MG tablet; Take 1 tablet (5 mg total) by mouth daily.  Dispense: 30 tablet; Refill: 3  3. Screening for colon cancer - Ambulatory referral to Gastroenterology  4. Osteoarthritis of right knee, unspecified osteoarthritis type Followed by orthopedics - traMADol (ULTRAM) 50 MG tablet; Take 1 tablet (50 mg total) by mouth every 12 (twelve) hours  as needed for up to 5 days.  Dispense: 30 tablet; Refill: 0  5. Pulmonary embolism, other, unspecified  chronicity, unspecified whether acute cor pulmonale present ([place]) On chronic anticoagulation with Xarelto  6. Chronic idiopathic gout involving toe of left foot without tophus He does not seem to have an acute gout flare We have discussed foods that trigger gout and the need to avoid them We will send of uric acid level - Uric Acid - colchicine 0.6 MG tablet; Take 1 tablet (0.6 mg) at the onset of a gout attack, may repeat 1 tablet in 2 hours if symptoms persist  Dispense: 30 tablet; Refill: 1  7. Pedal edema Likely dependent edema Short course of Lasix to prevent precipitation of gout flare - furosemide (LASIX) 20 MG tablet; Take 1 tablet (20 mg total) by mouth daily.  Dispense: 5 tablet; Refill: 0   Meds ordered this encounter  Medications  . methocarbamol (ROBAXIN) 500 MG tablet    Sig: Take 1 tablet (500 mg total) by mouth 2 (two) times daily.    Dispense:  20 tablet    Refill:  0  . colchicine 0.6 MG tablet    Sig: Take 1 tablet (0.6 mg) at the onset of a gout attack, may repeat 1 tablet in 2 hours if symptoms persist    Dispense:  30 tablet    Refill:  1  . atorvastatin (LIPITOR) 40 MG tablet    Sig: Take 1 tablet (40 mg total) by mouth daily.    Dispense:  30 tablet    Refill:  3  . traMADol (ULTRAM) 50 MG tablet    Sig: Take 1 tablet (50 mg total) by mouth every 12 (twelve) hours as needed for up to 5 days.    Dispense:  30 tablet    Refill:  0  . lisinopril (ZESTRIL) 5 MG tablet    Sig: Take 1 tablet (5 mg total) by mouth daily.    Dispense:  30 tablet    Refill:  3  . metFORMIN (GLUCOPHAGE) 500 MG tablet    Sig: Take 1 tablet (500 mg total) by mouth daily with breakfast.    Dispense:  30 tablet    Refill:  3  . furosemide (LASIX) 20 MG tablet    Sig: Take 1 tablet (20 mg total) by mouth daily.    Dispense:  5 tablet    Refill:  0    Follow-up: Return in about 3 months (around 10/27/2018) for Medical conditions.       [name], MD, FAAFP. [facility], [place]   07/27/2018, 10:53 AM

## 2018-07-28 LAB — URIC ACID: Uric Acid: 6 mg/dL (ref 3.7–8.6)

## 2018-07-28 LAB — LIPID PANEL
Chol/HDL Ratio: 3.7 ratio (ref 0.0–5.0)
Cholesterol, Total: 190 mg/dL (ref 100–199)
HDL: 51 mg/dL (ref >39)
LDL Calculated: 118 mg/dL — ABNORMAL HIGH (ref 0–99)
Triglycerides: 106 mg/dL (ref 0–149)
VLDL Cholesterol Cal: 21 mg/dL (ref 5–40)

## 2018-07-28 LAB — MICROALBUMIN / CREATININE URINE RATIO
Creatinine, Urine: 174.9 mg/dL
Microalb/Creat Ratio: 26 mg/g creat (ref 0–29)
Microalbumin, Urine: 44.8 ug/mL

## 2018-07-30 ENCOUNTER — Telehealth: Payer: Self-pay

## 2018-07-30 NOTE — Telephone Encounter (Signed)
-----   Message from [name], MD sent at 07/28/2018  8:23 AM EDT ----- Uric acid is normal which indicates he is not having a gout flare.  Cholesterol is elevated and will need him to commence his cholesterol pill (Lipitor), also encouraged to comply with a low-cholesterol diet, exercise and weight loss

## 2018-07-30 NOTE — Telephone Encounter (Signed)
Patient name and DOB has been verified Patient was informed of lab results. Patient had no questions.  

## 2018-08-04 ENCOUNTER — Other Ambulatory Visit: Payer: Self-pay | Admitting: Family Medicine

## 2018-08-04 DIAGNOSIS — N528 Other male erectile dysfunction: Secondary | ICD-10-CM

## 2018-08-04 MED FILL — LISINOPRIL 5 MG TAB: 5 | 30 days supply | Qty: 30 | Fill #0

## 2018-08-05 MED FILL — SILDENAFIL CITRATE 50 MG TA: 50 | 30 days supply | Qty: 4 | Fill #0

## 2018-08-12 ENCOUNTER — Other Ambulatory Visit: Payer: Self-pay | Admitting: Family Medicine

## 2018-08-12 ENCOUNTER — Telehealth: Payer: Self-pay | Admitting: Family Medicine

## 2018-08-12 DIAGNOSIS — I2699 Other pulmonary embolism without acute cor pulmonale: Secondary | ICD-10-CM

## 2018-08-12 NOTE — Telephone Encounter (Signed)
Pt called to confirm that a fax was received in regards to his knee replacement

## 2018-08-12 NOTE — Telephone Encounter (Signed)
Patient was called and a voicemail was left informing patient that paperwork has not been received.

## 2018-08-13 MED FILL — XARELTO 20 MG TABLET: 20 | 30 days supply | Qty: 30 | Fill #0

## 2018-08-19 ENCOUNTER — Telehealth: Payer: Self-pay | Admitting: *Deleted

## 2018-08-19 NOTE — Telephone Encounter (Signed)
Received call from Dr. Luanna Cole office regarding surgical clearance (knee replacement).  A surgical clearance note had been sent to them with pt ok to be off Xarelto for 3 days prior to surgery and resume 24 hours after surgery.  She states that the office notes a Lovenox bridge option.  They are asking which option is recommended for patient.  Please advise.

## 2018-08-20 ENCOUNTER — Telehealth: Payer: Self-pay | Admitting: *Deleted

## 2018-08-20 ENCOUNTER — Encounter: Payer: Self-pay | Admitting: Gastroenterology

## 2018-08-20 ENCOUNTER — Ambulatory Visit (INDEPENDENT_AMBULATORY_CARE_PROVIDER_SITE_OTHER): Payer: BLUE CROSS/BLUE SHIELD | Admitting: Gastroenterology

## 2018-08-20 ENCOUNTER — Telehealth: Payer: Self-pay

## 2018-08-20 ENCOUNTER — Other Ambulatory Visit: Payer: Self-pay

## 2018-08-20 DIAGNOSIS — Z1211 Encounter for screening for malignant neoplasm of colon: Secondary | ICD-10-CM | POA: Diagnosis not present

## 2018-08-20 DIAGNOSIS — Z1212 Encounter for screening for malignant neoplasm of rectum: Secondary | ICD-10-CM | POA: Diagnosis not present

## 2018-08-20 DIAGNOSIS — R1013 Epigastric pain: Secondary | ICD-10-CM | POA: Diagnosis not present

## 2018-08-20 MED ORDER — NA SULFATE-K SULFATE-MG SULF 17.5-3.13-1.6 GM/177ML PO SOLN: 1.0000 | Freq: Once | ORAL | 0 refills | Status: AC

## 2018-08-20 MED FILL — ALLOPURINOL 300 MG TAB: 300 | 30 days supply | Qty: 30 | Fill #1

## 2018-08-20 MED FILL — SUPREP BOWEL PREP KIT: 17.5-3.13-1 | 1 days supply | Qty: 354 | Fill #0

## 2018-08-20 NOTE — Progress Notes (Signed)
    History of Present Illness: This is a 53 year old male with epigastric pain that has improved on pantoprazole.  He was recommended to undergo EGD and colonoscopy after 4 to 6 months of anticoagulation for DVT and PE diagnosed in August 2019.  Please see the September 2019 GI office note.  In August 2019 CT scan showed questionable mild pancreatitis at the tail of the pancreas.  Repeat CT scan in October 2019 showed no pancreatic abnormalities. Denies weight loss, constipation, diarrhea, change in stool caliber, melena, hematochezia, nausea, vomiting, dysphagia, reflux symptoms, chest pain.  Current Medications, Allergies, Past Medical History, Past Surgical History, Family History and Social History were reviewed in [employer] record.   Physical Exam: Telemedicine - not performed   Assessment and Recommendations:  1. Epigastric pain. R/O GERD, gastritis, ulcer.  Continue pantoprazole 40 mg twice daily.  Schedule EGD. The risks (including bleeding, perforation, infection, missed lesions, medication reactions and possible hospitalization or surgery if complications occur), benefits, and alternatives to endoscopy with possible biopsy and possible dilation were discussed with the patient and they consent to proceed.   2. CRC screening, average risk. Schedule colonoscopy. The risks (including bleeding, perforation, infection, missed lesions, medication reactions and possible hospitalization or surgery if complications occur), benefits, and alternatives to colonoscopy with possible biopsy and possible polypectomy were discussed with the patient and they consent to proceed.   3. Hold Xarelto 2 days before procedure - will instruct when and how to resume after procedure. Low but real risk of cardiovascular event such as heart attack, stroke, embolism, thrombosis or ischemia/infarct of other organs off Xarleto explained and need to seek urgent help if this occurs. The patient  consents to proceed. Will communicate by phone or EMR with patient's prescribing provider to confirm that holding Xarelto is reasonable in this case.   4. Small paraumbilical hernia  5. Pancreatic abnormality, possible focal pancreatitis in the tail of the pancreas has resolved.   These services were provided via telemedicine, audio and video.  The patient was at home and the provider was in the office, alone.  We discussed the limitations of evaluation and management by telemedicine and the availability of in person appointments.  Patient consented for this telemedicine visit and is aware of possible charges for this service.  Office CMA or LPN participated in this telemedicine service.  Time spent on call: 7 minutes

## 2018-08-20 NOTE — Telephone Encounter (Signed)
Pre-op clearance form completed by Dr Walden Field regarding Xarelto and Lovenox instructions faxed to [employer].  Form sent to be scanned.

## 2018-08-20 NOTE — Telephone Encounter (Signed)
Okay to hold Xarelto for 2 days for Colonoscopy.

## 2018-08-20 NOTE — Telephone Encounter (Signed)
Informed patient per Dr. Margarita Rana he can hold Xarelto 2 days prior to his procedures. Patient verbalized understanding.

## 2018-08-20 NOTE — Telephone Encounter (Signed)
[facility] Pre-operative Risk Assessment     Request for surgical clearance:     Endoscopy Procedure  What type of surgery is being performed?     EGD/Colon  When is this surgery scheduled?     09/01/18  What type of clearance is required ?   Pharmacy  Are there any medications that need to be held prior to surgery and how long? Xarelto x 2 days  Practice name and name of physician performing surgery?      [facility]  What is your office phone and fax number?      Phone- [phone]  [phone]  Anesthesia type (None, local, MAC, general) ?       MAC

## 2018-08-20 NOTE — Patient Instructions (Signed)
You have been scheduled for an endoscopy and colonoscopy. Please follow the written instructions given to you at your visit today. Please pick up your prep supplies at the pharmacy within the next 1-3 days. If you use inhalers (even only as needed), please bring them with you on the day of your procedure. Your physician has requested that you go to www.startemmi.com and enter the access code given to you at your visit today. This web site gives a general overview about your procedure. However, you should still follow specific instructions given to you by our office regarding your preparation for the procedure.  Thank you for choosing me and [facility].  Malcolm T. Stark, Jr., MD., FACG  

## 2018-08-26 LAB — HM DIABETES EYE EXAM

## 2018-08-31 ENCOUNTER — Telehealth: Payer: Self-pay | Admitting: Gastroenterology

## 2018-08-31 NOTE — Telephone Encounter (Signed)
Covid-19 Screening Questions °  °  °Do you now or have you had a fever in the last 14 days?     NO °  °Do you have any respiratory symptoms of shortness of breath or cough now or in the last 14 days?    NO °  °Do you have any family members or close contacts with diagnosed or suspected Covid-19 in the past 14 days?     NO °  °Have you been tested for Covid-19 and found to be positive?    NO °  °Pt made aware of that care partner may wait in the car or come up to the lobby during the procedure but will need to provide their own mask. °

## 2018-09-01 ENCOUNTER — Ambulatory Visit (AMBULATORY_SURGERY_CENTER): Payer: BLUE CROSS/BLUE SHIELD | Admitting: Gastroenterology

## 2018-09-01 ENCOUNTER — Encounter: Payer: Self-pay | Admitting: Gastroenterology

## 2018-09-01 ENCOUNTER — Other Ambulatory Visit: Payer: Self-pay

## 2018-09-01 VITALS — BP 158/99 | HR 75 | Temp 98.7°F | Resp 14 | Ht 69.0 in | Wt 230.0 lb

## 2018-09-01 DIAGNOSIS — D123 Benign neoplasm of transverse colon: Secondary | ICD-10-CM | POA: Diagnosis not present

## 2018-09-01 DIAGNOSIS — D122 Benign neoplasm of ascending colon: Secondary | ICD-10-CM

## 2018-09-01 DIAGNOSIS — Z1211 Encounter for screening for malignant neoplasm of colon: Secondary | ICD-10-CM

## 2018-09-01 DIAGNOSIS — K219 Gastro-esophageal reflux disease without esophagitis: Secondary | ICD-10-CM

## 2018-09-01 DIAGNOSIS — D124 Benign neoplasm of descending colon: Secondary | ICD-10-CM

## 2018-09-01 DIAGNOSIS — D125 Benign neoplasm of sigmoid colon: Secondary | ICD-10-CM | POA: Diagnosis not present

## 2018-09-01 DIAGNOSIS — R1013 Epigastric pain: Secondary | ICD-10-CM

## 2018-09-01 MED ORDER — SODIUM CHLORIDE 0.9 % IV SOLN: 500.0000 mL | Freq: Once | INTRAVENOUS | Status: DC

## 2018-09-01 NOTE — Progress Notes (Signed)
Called to room to assist during endoscopic procedure.  Patient ID and intended procedure confirmed with present staff. Received instructions for my participation in the procedure from the performing physician.  

## 2018-09-01 NOTE — Op Note (Signed)
[facility] Patient Name: Terry Poole Procedure Date: 09/01/2018 3:16 PM MRN: [id number] Endoscopist: Ladene Artist , MD Age: 53 Referring MD:  Date of Birth: 08-Jun-1965 Gender: Male Account #: [id number] Procedure:                Colonoscopy Indications:              Screening for colorectal malignant neoplasm Medicines:                Monitored Anesthesia Care Procedure:                Pre-Anesthesia Assessment:                           - Prior to the procedure, a History and Physical                            was performed, and patient medications and                            allergies were reviewed. The patient's tolerance of                            previous anesthesia was also reviewed. The risks                            and benefits of the procedure and the sedation                            options and risks were discussed with the patient.                            All questions were answered, and informed consent                            was obtained. Prior Anticoagulants: The patient has                            taken Xarelto (rivaroxaban), last dose was 3 days                            prior to procedure. ASA Grade Assessment: III - A                            patient with severe systemic disease. After                            reviewing the risks and benefits, the patient was                            deemed in satisfactory condition to undergo the                            procedure.  After obtaining informed consent, the colonoscope                            was passed under direct vision. Throughout the                            procedure, the patient's blood pressure, pulse, and                            oxygen saturations were monitored continuously. The                            Colonoscope was introduced through the anus and                            advanced to the the cecum, identified by                  appendiceal orifice and ileocecal valve. The                            ileocecal valve, appendiceal orifice, and rectum                            were photographed. The quality of the bowel                            preparation was adequate after extensive lavage and                            suctioning. The colonoscopy was performed without                            difficulty. The patient tolerated the procedure                            well. Scope In: 3:32:17 PM Scope Out: 3:56:40 PM Scope Withdrawal Time: 0 hours 21 minutes 42 seconds  Total Procedure Duration: 0 hours 24 minutes 23 seconds  Findings:                 The perianal and digital rectal examinations were                            normal.                           Seven sessile polyps were found in the sigmoid                            colon (3), descending colon (1), transverse colon                            (2) and ascending colon (1). The polyps were 6 to 8  mm in size. These polyps were removed with a cold                            snare. Resection and retrieval were complete.                           Multiple small-mouthed diverticula were found in                            the left colon. There was evidence of diverticular                            spasm. There was no evidence of diverticular                            bleeding.                           Internal hemorrhoids were found during                            retroflexion. The hemorrhoids were medium-sized and                            Grade I (internal hemorrhoids that do not prolapse).                           The exam was otherwise without abnormality on                            direct and retroflexion views. Complications:            No immediate complications. Estimated blood loss:                            None. Estimated Blood Loss:     Estimated blood loss: none. Impression:                - Seven 6 to 8 mm polyps in the sigmoid colon, in                            the descending colon, in the transverse colon and                            in the ascending colon, removed with a cold snare.                            Resected and retrieved.                           - Moderate diverticulosis in the left colon.                           - Internal hemorrhoids.                           -  The examination was otherwise normal on direct                            and retroflexion views. Recommendation:           - Repeat colonoscopy for surveillance based on                            pathology results with a more extensive bowel prep.                           - Resume Xarelto (rivaroxaban) in 2 days at prior                            dose. Refer to managing physician for further                            adjustment of therapy.                           - Patient has a contact number available for                            emergencies. The signs and symptoms of potential                            delayed complications were discussed with the                            patient. Return to normal activities tomorrow.                            Written discharge instructions were provided to the                            patient.                           - High fiber diet.                           - Continue present medications.                           - Await pathology results. Ladene Artist, MD 09/01/2018 4:02:43 PM This report has been signed electronically.

## 2018-09-01 NOTE — Op Note (Signed)
[place] Patient Name: Terry Poole Procedure Date: 09/01/2018 3:17 PM MRN: [id number] Endoscopist: Ladene Artist , MD Age: 53 Referring MD:  Date of Birth: [birth year] Gender: Male Account #: [id number] Procedure:                Upper GI endoscopy Indications:              Epigastric abdominal pain, Gastro-esophageal reflux                            disease Medicines:                Monitored Anesthesia Care Procedure:                Pre-Anesthesia Assessment:                           - Prior to the procedure, a History and Physical                            was performed, and patient medications and                            allergies were reviewed. The patient's tolerance of                            previous anesthesia was also reviewed. The risks                            and benefits of the procedure and the sedation                            options and risks were discussed with the patient.                            All questions were answered, and informed consent                            was obtained. Prior Anticoagulants: The patient has                            taken Xarelto (rivaroxaban), last dose was 2 days                            prior to procedure. ASA Grade Assessment: III - A                            patient with severe systemic disease. After                            reviewing the risks and benefits, the patient was                            deemed in satisfactory condition to undergo the  procedure.                           After obtaining informed consent, the endoscope was                            passed under direct vision. Throughout the                            procedure, the patient's blood pressure, pulse, and                            oxygen saturations were monitored continuously. The                            Endoscope was introduced through the mouth, and   advanced to the second part of duodenum. The upper                            GI endoscopy was accomplished without difficulty.                            The patient tolerated the procedure well. Scope In: Scope Out: Findings:                 The esophagus was normal.                           The stomach was normal.                           The examined duodenum was normal.                           The cardia and gastric fundus were normal on                            retroflexion. Complications:            No immediate complications. Estimated Blood Loss:     Estimated blood loss: none. Impression:               - Normal esophagus.                           - Normal stomach.                           - Normal examined duodenum.                           - No specimens collected. Recommendation:           - Patient has a contact number available for                            emergencies. The signs and symptoms of potential  delayed complications were discussed with the                            patient. Return to normal activities tomorrow.                            Written discharge instructions were provided to the                            patient.                           - Resume previous diet.                           - Continue present medications.                           - Antireflux measures. Ladene Artist, MD 09/01/2018 3:31:12 PM This report has been signed electronically.

## 2018-09-01 NOTE — Patient Instructions (Signed)
GERD protocol High fiber diet and diverticulosis and internal hemorrhoids handouts given 4 Polyps removed RESUME XARELTO IN 2 DAYS AT PRIOR DOSE  YOU HAD AN ENDOSCOPIC PROCEDURE TODAY AT [facility]:   Refer to the procedure report that was given to you for any specific questions about what was found during the examination.  If the procedure report does not answer your questions, please call your gastroenterologist to clarify.  If you requested that your care partner not be given the details of your procedure findings, then the procedure report has been included in a sealed envelope for you to review at your convenience later.  YOU SHOULD EXPECT: Some feelings of bloating in the abdomen. Passage of more gas than usual.  Walking can help get rid of the air that was put into your GI tract during the procedure and reduce the bloating. If you had a lower endoscopy (such as a colonoscopy or flexible sigmoidoscopy) you may notice spotting of blood in your stool or on the toilet paper. If you underwent a bowel prep for your procedure, you may not have a normal bowel movement for a few days.  Please Note:  You might notice some irritation and congestion in your nose or some drainage.  This is from the oxygen used during your procedure.  There is no need for concern and it should clear up in a day or so.  SYMPTOMS TO REPORT IMMEDIATELY:   Following lower endoscopy (colonoscopy or flexible sigmoidoscopy):  Excessive amounts of blood in the stool  Significant tenderness or worsening of abdominal pains  Swelling of the abdomen that is new, acute  Fever of 100F or higher   Following upper endoscopy (EGD)  Vomiting of blood or coffee ground material  New chest pain or pain under the shoulder blades  Painful or persistently difficult swallowing  New shortness of breath  Fever of 100F or higher  Black, tarry-looking stools  For urgent or emergent issues, a gastroenterologist can be  reached at any hour by calling [phone].   DIET:  We do recommend a small meal at first, but then you may proceed to your regular diet.  Drink plenty of fluids but you should avoid alcoholic beverages for 24 hours.  ACTIVITY:  You should plan to take it easy for the rest of today and you should NOT DRIVE or use heavy machinery until tomorrow (because of the sedation medicines used during the test).    FOLLOW UP: Our staff will call the number listed on your records 48-72 hours following your procedure to check on you and address any questions or concerns that you may have regarding the information given to you following your procedure. If we do not reach you, we will leave a message.  We will attempt to reach you two times.  During this call, we will ask if you have developed any symptoms of COVID 19. If you develop any symptoms (ie: fever, flu-like symptoms, shortness of breath, cough etc.) before then, please call [phone].  If you test positive for Covid 19 in the 2 weeks post procedure, please call and report this information to [place].    If any biopsies were taken you will be contacted by phone or by letter within the next 1-3 weeks.  Please call us at [phone] if you have not heard about the biopsies in 3 weeks.    SIGNATURES/CONFIDENTIALITY: You and/or your care partner have signed paperwork which will be entered into your electronic  medical record.  These signatures attest to the fact that that the information above on your After Visit Summary has been reviewed and is understood.  Full responsibility of the confidentiality of this discharge information lies with you and/or your care-partner.

## 2018-09-01 NOTE — Progress Notes (Signed)
Report to PACU, RN, vss, BBS= Clear.  

## 2018-09-02 MED FILL — PANTOPRAZOLE SOD DR 40 MG T: 40 | 30 days supply | Qty: 60 | Fill #1

## 2018-09-03 ENCOUNTER — Telehealth: Payer: Self-pay

## 2018-09-03 NOTE — Telephone Encounter (Signed)
  Follow up Call-  Call back number 09/01/2018  Post procedure Call Back phone  # [phone]  Permission to leave phone message Yes  Some recent data might be hidden     Patient questions:  Do you have a fever, pain , or abdominal swelling? No. Pain Score  0 *  Have you tolerated food without any problems? Yes.    Have you been able to return to your normal activities? Yes.    Do you have any questions about your discharge instructions: Diet   No. Medications  No. Follow up visit  No.  Do you have questions or concerns about your Care? No.  Actions: * If pain score is 4 or above: No action needed, pain <4.   1. Have you developed a fever since your procedure? no  2.   Have you had an respiratory symptoms (SOB or cough) since your procedure? no  3.   Have you tested positive for COVID 19 since your procedure no  4.   Have you had any family members/close contacts diagnosed with the COVID 19 since your procedure?  no   If yes to any of these questions please route to Joylene John, RN and Alphonsa Gin, Therapist, sports.

## 2018-09-07 ENCOUNTER — Ambulatory Visit: Payer: BLUE CROSS/BLUE SHIELD | Admitting: Sports Medicine

## 2018-09-07 ENCOUNTER — Other Ambulatory Visit: Payer: Self-pay

## 2018-09-08 ENCOUNTER — Other Ambulatory Visit: Payer: Self-pay | Admitting: Family Medicine

## 2018-09-08 ENCOUNTER — Telehealth: Payer: Self-pay | Admitting: Family Medicine

## 2018-09-08 MED FILL — LISINOPRIL 5 MG TAB: 5 | 30 days supply | Qty: 30 | Fill #1

## 2018-09-08 MED FILL — ATORVASTATIN CALCIUM 40 MG: 40 | 30 days supply | Qty: 30 | Fill #1

## 2018-09-08 NOTE — Telephone Encounter (Signed)
Patient called wanting to know why his surgery was cancelled. Patient states he has been needing to get this procedure done for awhile. Please follow up.

## 2018-09-08 NOTE — Telephone Encounter (Signed)
Patient was called and a voicemail was left informing patient to return phone call. 

## 2018-09-09 ENCOUNTER — Other Ambulatory Visit: Payer: Self-pay | Admitting: Family Medicine

## 2018-09-10 ENCOUNTER — Encounter: Payer: Self-pay | Admitting: Gastroenterology

## 2018-09-13 ENCOUNTER — Other Ambulatory Visit: Payer: Self-pay | Admitting: Family Medicine

## 2018-09-14 ENCOUNTER — Other Ambulatory Visit: Payer: Self-pay | Admitting: Family Medicine

## 2018-09-22 ENCOUNTER — Encounter (HOSPITAL_COMMUNITY): Payer: BLUE CROSS/BLUE SHIELD

## 2018-09-22 ENCOUNTER — Telehealth: Payer: Self-pay | Admitting: Internal Medicine

## 2018-09-22 ENCOUNTER — Ambulatory Visit (HOSPITAL_COMMUNITY)
Admission: RE | Admit: 2018-09-22 | Discharge: 2018-09-22 | Disposition: A | Discharge status: Home / Self Care | Payer: BLUE CROSS/BLUE SHIELD | Source: Ambulatory Visit | Attending: Internal Medicine | Admitting: Internal Medicine

## 2018-09-22 ENCOUNTER — Other Ambulatory Visit: Payer: Self-pay

## 2018-09-22 ENCOUNTER — Inpatient Hospital Stay: Payer: BLUE CROSS/BLUE SHIELD | Attending: Internal Medicine

## 2018-09-22 DIAGNOSIS — Z86718 Personal history of other venous thrombosis and embolism: Secondary | ICD-10-CM | POA: Insufficient documentation

## 2018-09-22 DIAGNOSIS — F17210 Nicotine dependence, cigarettes, uncomplicated: Secondary | ICD-10-CM | POA: Diagnosis not present

## 2018-09-22 DIAGNOSIS — I10 Essential (primary) hypertension: Secondary | ICD-10-CM | POA: Insufficient documentation

## 2018-09-22 DIAGNOSIS — I824Z1 Acute embolism and thrombosis of unspecified deep veins of right distal lower extremity: Secondary | ICD-10-CM | POA: Insufficient documentation

## 2018-09-22 DIAGNOSIS — Z7901 Long term (current) use of anticoagulants: Secondary | ICD-10-CM | POA: Diagnosis not present

## 2018-09-22 DIAGNOSIS — I2699 Other pulmonary embolism without acute cor pulmonale: Secondary | ICD-10-CM

## 2018-09-22 DIAGNOSIS — Z86711 Personal history of pulmonary embolism: Secondary | ICD-10-CM | POA: Diagnosis not present

## 2018-09-22 LAB — CMP (CANCER CENTER ONLY)
ALT: 59 U/L — ABNORMAL HIGH (ref 0–44)
AST: 28 U/L (ref 15–41)
Albumin: 4.1 g/dL (ref 3.5–5.0)
Alkaline Phosphatase: 135 U/L — ABNORMAL HIGH (ref 38–126)
Anion gap: 11 (ref 5–15)
BUN: 15 mg/dL (ref 6–20)
CO2: 22 mmol/L (ref 22–32)
Calcium: 8.7 mg/dL — ABNORMAL LOW (ref 8.9–10.3)
Chloride: 108 mmol/L (ref 98–111)
Creatinine: 1.21 mg/dL (ref 0.61–1.24)
GFR, Est AFR Am: >60 mL/min (ref >60)
GFR, Estimated: >60 mL/min (ref >60)
Glucose, Bld: 101 mg/dL — ABNORMAL HIGH (ref 70–99)
Potassium: 3.9 mmol/L (ref 3.5–5.1)
Sodium: 141 mmol/L (ref 135–145)
Total Bilirubin: 0.6 mg/dL (ref 0.3–1.2)
Total Protein: 7.6 g/dL (ref 6.5–8.1)

## 2018-09-22 LAB — CBC WITH DIFFERENTIAL (CANCER CENTER ONLY)
Abs Immature Granulocytes: 0.02 10*3/uL (ref 0.00–0.07)
Basophils Absolute: 0 10*3/uL (ref 0.0–0.1)
Basophils Relative: 0 %
Eosinophils Absolute: 0.2 10*3/uL (ref 0.0–0.5)
Eosinophils Relative: 3 %
HCT: 38.3 % — ABNORMAL LOW (ref 39.0–52.0)
Hemoglobin: 12.5 g/dL — ABNORMAL LOW (ref 13.0–17.0)
Immature Granulocytes: 0 %
Lymphocytes Relative: 57 %
Lymphs Abs: 3.1 10*3/uL (ref 0.7–4.0)
MCH: 28.5 pg (ref 26.0–34.0)
MCHC: 32.6 g/dL (ref 30.0–36.0)
MCV: 87.4 fL (ref 80.0–100.0)
Monocytes Absolute: 0.3 10*3/uL (ref 0.1–1.0)
Monocytes Relative: 6 %
Neutro Abs: 1.9 10*3/uL (ref 1.7–7.7)
Neutrophils Relative %: 34 %
Platelet Count: 241 10*3/uL (ref 150–400)
RBC: 4.38 MIL/uL (ref 4.22–5.81)
RDW: 14.9 % (ref 11.5–15.5)
WBC Count: 5.6 10*3/uL (ref 4.0–10.5)
nRBC: 0 % (ref 0.0–0.2)

## 2018-09-22 LAB — LACTATE DEHYDROGENASE: LDH: 201 U/L — ABNORMAL HIGH (ref 98–192)

## 2018-09-22 NOTE — Progress Notes (Signed)
Bilateral lower extremity venous duplex has been completed. Preliminary results can be found in CV Proc through chart review.  Results were given to Tammy at Dr. Walden Field' office.  09/22/18 12:50 PM Terry Poole RVT

## 2018-09-22 NOTE — Telephone Encounter (Signed)
[facility] transfer to [facility]. Confirmed with patient. Per secure chat message from desk nurse call came in today re patient doppler study. Desk nurse referred to Dr. Julien Nordmann. Per follow up secure chat message from desk nurse schedule with MM/Cassie 8/26.

## 2018-09-28 ENCOUNTER — Ambulatory Visit: Admit: 2018-09-28 | Payer: BLUE CROSS/BLUE SHIELD | Admitting: Orthopedic Surgery

## 2018-09-28 SURGERY — ARTHROPLASTY, KNEE, UNICOMPARTMENTAL
Anesthesia: Choice | Laterality: Right

## 2018-09-29 ENCOUNTER — Ambulatory Visit: Payer: BLUE CROSS/BLUE SHIELD | Admitting: Internal Medicine

## 2018-09-29 ENCOUNTER — Other Ambulatory Visit: Payer: Self-pay

## 2018-09-29 ENCOUNTER — Encounter: Payer: Self-pay | Admitting: Physician Assistant

## 2018-09-29 ENCOUNTER — Inpatient Hospital Stay (HOSPITAL_BASED_OUTPATIENT_CLINIC_OR_DEPARTMENT_OTHER): Payer: BLUE CROSS/BLUE SHIELD | Admitting: Physician Assistant

## 2018-09-29 VITALS — BP 155/92 | HR 87 | Temp 97.8°F | Resp 18 | Ht 69.0 in | Wt 234.3 lb

## 2018-09-29 DIAGNOSIS — I82401 Acute embolism and thrombosis of unspecified deep veins of right lower extremity: Secondary | ICD-10-CM

## 2018-09-29 DIAGNOSIS — I2699 Other pulmonary embolism without acute cor pulmonale: Secondary | ICD-10-CM

## 2018-09-29 DIAGNOSIS — Z86718 Personal history of other venous thrombosis and embolism: Secondary | ICD-10-CM | POA: Diagnosis not present

## 2018-09-29 NOTE — Progress Notes (Signed)
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 First dose August 2019.   INTERVAL HISTORY: Terry Poole 53 y.o. male returns to the clinic for a follow up visit. The patient is feeling well today without any concerning complaints. The patient is followed by our clinic for a DVT and a history of a pulmonary embolism in August 2019. The patient is on Xarelto and has been on Xarelto for 1 year. He denies any adverse side effects of xarelto including bruising, epistaxis, gingival bleeding, hematuria, melena, or hematochezia. The patient's thrombophilia workup has been negative. The patient denied any surgeries, inactivity, travel, or injury to his right lower extremity prior to developing the DVT. The patient continues to smoke cigarettes intermittently when he is stressed out. He denies any chest pain, shortness of breath, cough, or hemoptysis. He notes occasional swelling of his right lower extremity.   Today, the patient is doing well. He was planning on having a knee replacement yesterday but it was cancelled. The patient endorses significant right knee and hip pain secondary to needing a knee replacement. He is followed by Dr. Case and has a follow up appointment tomorrow for evaluation.   The patient recently had a repeat doppler ultrasound of his lower extremity. He is here for evaluation and recommendations.     MEDICAL HISTORY: Past Medical History:  Diagnosis Date  . Bronchitis   . Bronchitis   . DVT (deep venous thrombosis) ([place])   . Gout   . Hammertoe of second toe of right foot   . Smoker     ALLERGIES:  is allergic to vicodin [hydrocodone-acetaminophen].  MEDICATIONS:  Current Outpatient Medications  Medication Sig Dispense Refill  . acetaminophen  (TYLENOL) 500 MG tablet Take 2 tablets (1,000 mg total) by mouth every 8 (eight) hours as needed for moderate pain. 30 tablet 0  . allopurinol (ZYLOPRIM) 300 MG tablet TAKE 1 TABLET (300 MG TOTAL) BY MOUTH DAILY. 30 tablet 3  . atorvastatin (LIPITOR) 40 MG tablet Take 1 tablet (40 mg total) by mouth daily. 30 tablet 3  . colchicine 0.6 MG tablet Take 1 tablet (0.6 mg) at the onset of a gout attack, may repeat 1 tablet in 2 hours if symptoms persist 30 tablet 1  . fluticasone (FLONASE) 50 MCG/ACT nasal spray Place 2 sprays into both nostrils daily. 16 g 6  . furosemide (LASIX) 20 MG tablet Take 1 tablet (20 mg total) by mouth daily. 5 tablet 0  . lisinopril (ZESTRIL) 5 MG tablet Take 1 tablet (5 mg total) by mouth daily. 30 tablet 3  . metFORMIN (GLUCOPHAGE) 500 MG tablet Take 1 tablet (500 mg total) by mouth daily with breakfast. 30 tablet 3  . pantoprazole (PROTONIX) 40 MG tablet Take 1 tablet (40 mg total) by mouth 2 (two) times daily. 60 tablet 3  . VIAGRA 50 MG tablet TAKE 1 TABLET BY MOUTH DAILY AS NEEDED FOR ERECTILE DYSFUNCTION. AT LEAST 24 HOURS BETWEEN DOSES 10 tablet 2  . XARELTO 20 MG TABS tablet TAKE 1 TABLET BY MOUTH DAILY WITH SUPPER. 30 tablet 2   No current facility-administered medications for this visit.     SURGICAL HISTORY:  Past Surgical History:  Procedure Laterality Date  . HAMMER TOE SURGERY Right 03/10/2016   Procedure: 2ND  RIGHT HAMMER TOE CORRECTION;  Surgeon: Terry Poole, DPM;  Location: [facility];  Service: Podiatry;  Laterality: Right;  . MASS EXCISION Right 03/10/2016   Procedure: EXCISION OF CALLUS 2ND RIGHT TOE;  Surgeon: Terry Poole, DPM;  Location: [facility];  Service: Podiatry;  Laterality: Right;  . NO PAST SURGERIES      REVIEW OF SYSTEMS:   Review of Systems  Constitutional: Negative for appetite change, chills, fatigue, fever and unexpected weight change.  HENT:   Negative for mouth sores, nosebleeds, sore throat  and trouble swallowing.   Eyes: Negative for eye problems and icterus.  Respiratory: Negative for cough, hemoptysis, shortness of breath and wheezing.   Cardiovascular: Positive for occasional right lower leg swelling. Negative for chest pain. Gastrointestinal: Negative for abdominal pain, constipation, diarrhea, nausea and vomiting.  Genitourinary: Negative for bladder incontinence, difficulty urinating, dysuria, frequency and hematuria.   Musculoskeletal: Positive for right knee and hip pain. Negative for back pain, gait problem, neck pain and neck stiffness.  Skin: Negative for itching and rash.  Neurological: Negative for dizziness, extremity weakness, gait problem, headaches, light-headedness and seizures.  Hematological: Negative for adenopathy. Does not bruise/bleed easily.  Psychiatric/Behavioral: Negative for confusion, depression and sleep disturbance. The patient is not nervous/anxious.     PHYSICAL EXAMINATION:  Blood pressure (!) 155/92, pulse 87, temperature 97.8 F (36.6 C), temperature source Temporal, resp. rate 18, height 5\' 9"  (1.753 m), weight 234 lb 4.8 oz (106.3 kg), SpO2 100 %.  ECOG PERFORMANCE STATUS: 1 - Symptomatic but completely ambulatory  Physical Exam  Constitutional: Oriented to person, place, and time and well-developed, well-nourished, and in no distress.  HENT:  Head: Normocephalic and atraumatic.  Mouth/Throat: Oropharynx is clear and moist. No oropharyngeal exudate.  Eyes: Conjunctivae are normal. Right eye exhibits no discharge. Left eye exhibits no discharge. No scleral icterus.  Neck: Normal range of motion. Neck supple.  Cardiovascular: Normal rate, regular rhythm, normal heart sounds and intact distal pulses.   Pulmonary/Chest: Effort normal and breath sounds normal. No respiratory distress. No wheezes. No rales.  Abdominal: Soft. Bowel sounds are normal. Exhibits no distension and no mass. There is no tenderness.  Musculoskeletal: Mild lower  extremity swelling. Normal range of motion. Lymphadenopathy:    No cervical adenopathy.  Neurological: Alert and oriented to person, place, and time. Exhibits normal muscle tone. Gait normal. Coordination normal.  Skin: Skin is warm and dry. No rash noted. Not diaphoretic. No erythema. No pallor.  Psychiatric: Mood, memory and judgment normal.  Vitals reviewed.  LABORATORY DATA: Lab Results  Component Value Date   WBC 5.6 09/22/2018   HGB 12.5 (L) 09/22/2018   HCT 38.3 (L) 09/22/2018   MCV 87.4 09/22/2018   PLT 241 09/22/2018      Chemistry      Component Value Date/Time   NA 141 09/22/2018 1156   NA 139 09/24/2017 1106   K 3.9 09/22/2018 1156   CL 108 09/22/2018 1156   CO2 22 09/22/2018 1156   BUN 15 09/22/2018 1156   BUN 10 09/24/2017 1106   CREATININE 1.21 09/22/2018 1156   CREATININE 0.94 09/21/2015 1053      Component Value Date/Time   CALCIUM 8.7 (L) 09/22/2018 1156   ALKPHOS 135 (H) 09/22/2018 1156   AST 28 09/22/2018 1156   ALT 59 (H) 09/22/2018 1156   BILITOT 0.6 09/22/2018 1156       RADIOGRAPHIC STUDIES:  Vas [place] Lower Extremity Venous (dvt)  Result Date:  09/22/2018  Lower Venous Study Indications: History of DVT.  Risk Factors: None identified. Comparison Study: 07/14/2018 - Age indeterminant R PopV DVT Performing Technologist: Terry Poole RVT  Examination Guidelines: A complete evaluation includes B-mode imaging, spectral Doppler, color Doppler, and power Doppler as needed of all accessible portions of each vessel. Bilateral testing is considered an integral part of a complete examination. Limited examinations for reoccurring indications may be performed as noted.  +---------+---------------+---------+-----------+----------+-----------------+ RIGHT    CompressibilityPhasicitySpontaneityPropertiesThrombus Aging    +---------+---------------+---------+-----------+----------+-----------------+ CFV      Full           Yes      Yes                                     +---------+---------------+---------+-----------+----------+-----------------+ SFJ      Full                                                           +---------+---------------+---------+-----------+----------+-----------------+ FV Prox  Full                                                           +---------+---------------+---------+-----------+----------+-----------------+ FV Mid   Full                                                           +---------+---------------+---------+-----------+----------+-----------------+ FV DistalFull                                                           +---------+---------------+---------+-----------+----------+-----------------+ PFV      Full                                                           +---------+---------------+---------+-----------+----------+-----------------+ POP      Partial        Yes      Yes                  Age Indeterminate +---------+---------------+---------+-----------+----------+-----------------+ PTV      Full                                                           +---------+---------------+---------+-----------+----------+-----------------+ PERO     Full                                                           +---------+---------------+---------+-----------+----------+-----------------+   +---------+---------------+---------+-----------+----------+--------------+  LEFT     CompressibilityPhasicitySpontaneityPropertiesThrombus Aging +---------+---------------+---------+-----------+----------+--------------+ CFV      Full           Yes      Yes                                 +---------+---------------+---------+-----------+----------+--------------+ SFJ      Full                                                        +---------+---------------+---------+-----------+----------+--------------+ FV Prox  Full                                                         +---------+---------------+---------+-----------+----------+--------------+ FV Mid   Full                                                        +---------+---------------+---------+-----------+----------+--------------+ FV DistalFull                                                        +---------+---------------+---------+-----------+----------+--------------+ PFV      Full                                                        +---------+---------------+---------+-----------+----------+--------------+ POP      Full           Yes      Yes                                 +---------+---------------+---------+-----------+----------+--------------+ PTV      Full                                                        +---------+---------------+---------+-----------+----------+--------------+ PERO     Full                                                        +---------+---------------+---------+-----------+----------+--------------+     Summary: Right: Findings consistent with age indeterminate deep vein thrombosis involving the right popliteal vein. No cystic structure found in the popliteal fossa. Left: No evidence of deep vein thrombosis in the lower extremity. No indirect evidence of obstruction proximal to the inguinal  ligament. No cystic structure found in the popliteal fossa. Possible obstruction proximal to the inguinal ligament.  *See table(s) above for measurements and observations. Electronically signed by Terry Jews MD on 09/22/2018 at 4:26:38 PM.    Final      ASSESSMENT/PLAN:  This is a very pleasant 53 year old [place] American male who is followed for a history of PE and DVT in August 2019. The patient's hypercoagulable workup is negative.   The patient was seen with Dr. Julien Nordmann today. The patient recently had a lower extremity doppler ultrasound performed which showed age indeterminate DVT in the right popliteal vein.   The  patient has completed anti-coagulation for 1 year with Xarelto. Dr. Julien Nordmann recommends that the patient discontinue Xarelto now that he has completed a year of anti-coagulation. It was recommended that he take a daily aspirin.   If the patient is to proceed with a knee replacement, then Dr. Julien Nordmann recommended that the patient be anti-coagulated for 3-6 weeks following the procedure.   We will see the patient on an as needed basis. The patient may follow with his primary care provider.   The patient was strongly encouraged to quit smoking.   For the hypertension, the patient was encouraged to take his anti-hypertensive medications as prescribed. If his blood pressure continues to be high, he was advised to follow up with his primary care provider for medication adjustment  The patient was advised to call immediately if he has any concerning symptoms in the interval. The patient voices understanding of current disease status and treatment options and is in agreement with the current care plan. All questions were answered. The patient knows to call the clinic with any problems, questions or concerns. We can certainly see the patient much sooner if necessary  No orders of the defined types were placed in this encounter.    Terry L Heilingoetter, PA-C 09/29/18  ADDENDUM: Hematology/Oncology Attending: I had a face-to-face encounter with the patient today.  I recommended his care plan.  This is a very pleasant 53 years old African-American male, a previous patient of Dr. Walden Field who came today for follow-up visit and evaluation regarding his history of pulmonary embolism and deep venous thrombosis that was diagnosed in August 2019.  The patient had hypercoagulable work-up performed at that time that was unremarkable.  He was a started on treatment with Xarelto for 1 year and has been tolerating this treatment well with no concerning bleeding issues. He had repeat Doppler of the lower extremities  performed recently that showed deep venous thrombosis in the left lower extremity of indeterminate age. I recommended for the patient to discontinue his current treatment with Xarelto at this point since the patient has no underlying genetic or acquired abnormality to recommend for lifelong anticoagulation. He will follow-up with his primary care physician at this point and I will be happy to see him in the future if needed.  If the patient has recurrent deep venous thrombosis or pulmonary embolism, he may need to be on anticoagulation for life. He was advised to call if he has any concerning symptoms.  Disclaimer: This note was dictated with voice recognition software. Similar sounding words can inadvertently be transcribed and may be missed upon review. Eilleen Kempf, MD 09/29/18

## 2018-09-29 NOTE — Patient Instructions (Signed)
-  If you are not going to have knee surgery, you do not need to continue Xarelto. But you should be taking a baby aspirin 81 mg  -If you do get knee surgery, then you will need to be on a blood thinner (Xarelto) for 3-6 weeks for prevention after surgery -If you develop another clot in the future, then you likely need to be on a blood thinner for the rest of long term  -You do not need to schedule anymore follow up appointments, however, if you need [place] in the future, then our number is [phone]

## 2018-10-04 MED FILL — ALLOPURINOL 300 MG TAB: 300 | 30 days supply | Qty: 30 | Fill #2

## 2018-10-12 MED FILL — LISINOPRIL 5 MG TABLET: 5 | 30 days supply | Qty: 30 | Fill #2

## 2018-10-12 MED FILL — metFORMIN HCL 500 MG TABS: 500 | 30 days supply | Qty: 30 | Fill #1

## 2018-10-12 MED FILL — MITIGARE 0.6 MG CAPSULE: 0.6 | 15 days supply | Qty: 30 | Fill #1

## 2018-10-21 ENCOUNTER — Emergency Department (HOSPITAL_COMMUNITY): Payer: BLUE CROSS/BLUE SHIELD

## 2018-10-21 ENCOUNTER — Emergency Department (HOSPITAL_COMMUNITY)
Admission: EM | Admit: 2018-10-21 | Discharge: 2018-10-21 | Disposition: A | Discharge status: Home / Self Care | Payer: BLUE CROSS/BLUE SHIELD | Attending: Emergency Medicine | Admitting: Emergency Medicine

## 2018-10-21 ENCOUNTER — Encounter (HOSPITAL_COMMUNITY): Payer: Self-pay | Admitting: Emergency Medicine

## 2018-10-21 ENCOUNTER — Other Ambulatory Visit: Payer: Self-pay

## 2018-10-21 DIAGNOSIS — I10 Essential (primary) hypertension: Secondary | ICD-10-CM | POA: Diagnosis not present

## 2018-10-21 DIAGNOSIS — M25511 Pain in right shoulder: Secondary | ICD-10-CM | POA: Insufficient documentation

## 2018-10-21 DIAGNOSIS — F17210 Nicotine dependence, cigarettes, uncomplicated: Secondary | ICD-10-CM | POA: Diagnosis not present

## 2018-10-21 DIAGNOSIS — E119 Type 2 diabetes mellitus without complications: Secondary | ICD-10-CM | POA: Diagnosis not present

## 2018-10-21 DIAGNOSIS — R0789 Other chest pain: Secondary | ICD-10-CM | POA: Diagnosis present

## 2018-10-21 DIAGNOSIS — Z7984 Long term (current) use of oral hypoglycemic drugs: Secondary | ICD-10-CM | POA: Diagnosis not present

## 2018-10-21 DIAGNOSIS — Z79899 Other long term (current) drug therapy: Secondary | ICD-10-CM | POA: Diagnosis not present

## 2018-10-21 LAB — CBC
HCT: 40.4 % (ref 39.0–52.0)
Hemoglobin: 12.8 g/dL — ABNORMAL LOW (ref 13.0–17.0)
MCH: 28.1 pg (ref 26.0–34.0)
MCHC: 31.7 g/dL (ref 30.0–36.0)
MCV: 88.8 fL (ref 80.0–100.0)
Platelets: 281 10*3/uL (ref 150–400)
RBC: 4.55 MIL/uL (ref 4.22–5.81)
RDW: 15 % (ref 11.5–15.5)
WBC: 6.8 10*3/uL (ref 4.0–10.5)
nRBC: 0 % (ref 0.0–0.2)

## 2018-10-21 LAB — BASIC METABOLIC PANEL
Anion gap: 11 (ref 5–15)
BUN: 13 mg/dL (ref 6–20)
CO2: 21 mmol/L — ABNORMAL LOW (ref 22–32)
Calcium: 9.1 mg/dL (ref 8.9–10.3)
Chloride: 107 mmol/L (ref 98–111)
Creatinine, Ser: 0.98 mg/dL (ref 0.61–1.24)
GFR calc Af Amer: >60 mL/min (ref >60)
GFR calc non Af Amer: >60 mL/min (ref >60)
Glucose, Bld: 102 mg/dL — ABNORMAL HIGH (ref 70–99)
Potassium: 3.6 mmol/L (ref 3.5–5.1)
Sodium: 139 mmol/L (ref 135–145)

## 2018-10-21 LAB — TROPONIN I (HIGH SENSITIVITY)
Troponin I (High Sensitivity): 5 ng/L (ref <18)
Troponin I (High Sensitivity): 6 ng/L (ref <18)

## 2018-10-21 MED ORDER — DICLOFENAC SODIUM 1 % TD GEL: 2.0000 g | Freq: Four times a day (QID) | TRANSDERMAL | 0 refills | Status: DC

## 2018-10-21 MED ORDER — IOHEXOL 350 MG/ML SOLN
100.0000 mL | Freq: Once | INTRAVENOUS | Status: AC | PRN
  Administered 2018-10-21: 100 mL via INTRAVENOUS

## 2018-10-21 MED ORDER — KETOROLAC TROMETHAMINE 15 MG/ML IJ SOLN
15.0000 mg | Freq: Once | INTRAMUSCULAR | Status: AC
  Administered 2018-10-21: 15 mg via INTRAVENOUS
  Filled 2018-10-21: qty 1

## 2018-10-21 MED ORDER — SODIUM CHLORIDE (PF) 0.9 % IJ SOLN
INTRAMUSCULAR | Status: AC
  Filled 2018-10-21: qty 50

## 2018-10-21 NOTE — Discharge Instructions (Signed)
Continue taking home medications as prescribed. Follow-up with your primary care doctor as needed for further evaluation. Use Voltaren gel to help with pain. Return to the emergency room if you develop fever, difficulty breathing, or any new, worsening, or concerning symptoms.

## 2018-10-21 NOTE — ED Notes (Signed)
Pt transported to CT ?

## 2018-10-21 NOTE — ED Triage Notes (Signed)
Pt c/o right arm and shoulder pains that started 4 days ago that radiates to right neck and chest areas. Reports is worse when changing positions. Pain not relieved with Tylenol extra strength. Hx DVTs but currently stopped taking Xeralto due to having right knee surgery.

## 2018-10-22 NOTE — ED Provider Notes (Signed)
Terry Poole Provider Note   CSN: [id number] Arrival date & time: 10/21/18  1528     History   Chief Complaint Chief Complaint  Patient presents with  . Chest Pain  . Shoulder Pain    HPI Terry Poole is a 53 y.o. male presenting for evaluation of chest and shoulder pain.  Patient states for the past 4 days, he has been having chest pain.  He states it began in his shoulder, but since has radiated to his chest.  It does move around various parts of his anterior chest, but mostly on the right side.  This began while he was sitting watching TV.  Pain is worse with movement and with deep inspiration.  Nothing makes it better.  He denies fall, trauma, or injury.  He denies fevers, chills, cough, shortness of breath, nausea, vomiting, abdominal pain.  Patient with a history of DVT, was recently taken off his Xarelto.  Last ultrasound (09/22/2018) showed age indeterminate clot.  He denies recent travel, surgeries, immobilization, history of cancer, or hormone use.     HPI  Past Medical History:  Diagnosis Date  . Bronchitis   . Bronchitis   . DVT (deep venous thrombosis) ([place])   . Gout   . Hammertoe of second toe of right foot   . Smoker     Patient Active Problem List   Diagnosis Date Noted  . Osteoarthritis of right knee 12/10/2017  . DVT (deep venous thrombosis) ([place]) 10/13/2017  . Pulmonary embolism ([place]) 10/02/2017  . AKI (acute kidney injury) ([place]) 09/22/2017  . Chest pain 09/22/2017  . Hypertension 11/19/2016  . Type 2 diabetes mellitus ([place]) 08/19/2016  . Hyperlipidemia 08/19/2016  . Back pain 05/13/2016  . Hammertoe of second toe of right foot 03/06/2016  . Onychomycosis 04/16/2015  . Pseudofolliculitis barbae [id number]  . Gout 03/28/2015    Past Surgical History:  Procedure Laterality Date  . HAMMER TOE SURGERY Right 03/10/2016   Procedure: 2ND RIGHT HAMMER TOE CORRECTION;  Surgeon: Landis Martins, DPM;  Location: [place];  Service: Podiatry;  Laterality: Right;  . MASS EXCISION Right 03/10/2016   Procedure: EXCISION OF CALLUS 2ND RIGHT TOE;  Surgeon: Landis Martins, DPM;  Location: [facility];  Service: Podiatry;  Laterality: Right;  . NO PAST SURGERIES          Home Medications    Prior to Admission medications   Medication Sig Start Date End Date Taking? Authorizing Provider  acetaminophen (TYLENOL) 500 MG tablet Take 2 tablets (1,000 mg total) by mouth every 8 (eight) hours as needed for moderate pain. 03/23/18  Yes Law, Bea Graff, PA-C  allopurinol (ZYLOPRIM) 300 MG tablet TAKE 1 TABLET (300 MG TOTAL) BY MOUTH DAILY. 06/30/18  Yes [name], MD  colchicine 0.6 MG tablet Take 1 tablet (0.6 mg) at the onset of a gout attack, may repeat 1 tablet in 2 hours if symptoms persist 07/27/18  Yes Newlin, Enobong, MD  fluticasone (FLONASE) 50 MCG/ACT nasal spray Place 2 sprays into both nostrils daily. Patient taking differently: Place 2 sprays into both nostrils daily as needed for allergies.  06/10/18  Yes Freeman Caldron M, PA-C  lisinopril (ZESTRIL) 5 MG tablet Take 1 tablet (5 mg total) by mouth daily. 07/27/18  Yes [name], MD  metFORMIN (GLUCOPHAGE) 500 MG tablet Take 1 tablet (500 mg total) by mouth daily with breakfast. 07/27/18  Yes Newlin, Enobong, MD  atorvastatin (LIPITOR) 40 MG tablet Take  1 tablet (40 mg total) by mouth daily. Patient not taking: Reported on 10/21/2018 07/27/18   [name], MD  diclofenac sodium (VOLTAREN) 1 % GEL Apply 2 g topically 4 (four) times daily. 10/21/18   Yisell Sprunger, PA-C  furosemide (LASIX) 20 MG tablet Take 1 tablet (20 mg total) by mouth daily. Patient not taking: Reported on 10/21/2018 07/27/18   [name], MD  pantoprazole (PROTONIX) 40 MG tablet Take 1 tablet (40 mg total) by mouth 2 (two) times daily. Patient not taking: Reported on 10/21/2018 03/16/18   [name], MD  VIAGRA 50 MG tablet TAKE 1 TABLET BY  MOUTH DAILY AS NEEDED FOR ERECTILE DYSFUNCTION. AT LEAST 24 HOURS BETWEEN DOSES Patient not taking: No sig reported 08/05/18   [name], MD  XARELTO 20 MG TABS tablet TAKE 1 TABLET BY MOUTH DAILY WITH SUPPER. Patient taking differently: Take 20 mg by mouth daily with supper.  08/13/18   [name], MD    Family History Family History  Problem Relation Age of Onset  . Heart disease Neg Hx   . Kidney disease Neg Hx     Social History Social History   Tobacco Use  . Smoking status: Current Every Day Smoker    Packs/day: 0.25    Types: Cigarettes  . Smokeless tobacco: Never Used  . Tobacco comment: 8 cigs daily  Substance Use Topics  . Alcohol use: No  . Drug use: No     Allergies   Vicodin [hydrocodone-acetaminophen]   Review of Systems Review of Systems  Cardiovascular: Positive for chest pain.  All other systems reviewed and are negative.    Physical Exam Updated Vital Signs BP (!) 162/104   Pulse 63   Temp 98.1 F (36.7 C) (Oral)   Resp 18   SpO2 99%   Physical Exam Vitals signs and nursing note reviewed.  Constitutional:      General: He is not in acute distress.    Appearance: He is well-developed.     Comments: Sitting in the bed in no acute distress  HENT:     Head: Normocephalic and atraumatic.  Eyes:     Extraocular Movements: Extraocular movements intact.     Conjunctiva/sclera: Conjunctivae normal.     Pupils: Pupils are equal, round, and reactive to light.  Neck:     Musculoskeletal: Normal range of motion and neck supple.  Cardiovascular:     Rate and Rhythm: Normal rate and regular rhythm.     Pulses: Normal pulses.  Pulmonary:     Effort: Pulmonary effort is normal. No respiratory distress.     Breath sounds: Normal breath sounds. No wheezing.     Comments: Speaking in full sentences.  Pleural rub heard on expiration, worse on the right side. No ttp of the chest wall Chest:    Abdominal:     General: There is no distension.      Palpations: Abdomen is soft. There is no mass.     Tenderness: There is no abdominal tenderness. There is no guarding or rebound.  Musculoskeletal: Normal range of motion.     Comments: No obvious calf swelling or edema.  Skin:    General: Skin is warm and dry.     Capillary Refill: Capillary refill takes less than 2 seconds.  Neurological:     Mental Status: He is alert and oriented to person, place, and time.      ED Treatments / Results  Labs (all labs ordered are listed, but  only abnormal results are displayed) Labs Reviewed  BASIC METABOLIC PANEL - Abnormal; Notable for the following components:      Result Value   CO2 21 (*)    Glucose, Bld 102 (*)    All other components within normal limits  CBC - Abnormal; Notable for the following components:   Hemoglobin 12.8 (*)    All other components within normal limits  TROPONIN I (HIGH SENSITIVITY)  TROPONIN I (HIGH SENSITIVITY)    EKG EKG Interpretation  Date/Time:  Thursday October 21 2018 15:45:17 EDT Ventricular Rate:  80 PR Interval:    QRS Duration: 109 QT Interval:  393 QTC Calculation: 454 R Axis:   23 Text Interpretation:  Sinus rhythm RSR' in V1 or V2, probably normal variant Minimal ST elevation, anterior leads no change from previous Confirmed by Charlesetta Shanks [phone]) on 10/21/2018 8:35:44 PM   Radiology Dg Chest 2 View  Result Date: 10/21/2018 CLINICAL DATA:  Chest pain radiating to right neck EXAM: CHEST - 2 VIEW COMPARISON:  07/14/2018 FINDINGS: The heart size and mediastinal contours are within normal limits. Both lungs are clear. Disc degenerative disease of the thoracic spine. IMPRESSION: No acute abnormality of the lungs. Electronically Signed   By: Eddie Candle M.D.   On: 10/21/2018 16:13   Ct Angio Chest Pe W And/or Wo Contrast  Result Date: 10/21/2018 CLINICAL DATA:  Right arm and shoulder pain right neck and chest pain. History of deep venous thrombosis. EXAM: CT ANGIOGRAPHY CHEST WITH  CONTRAST TECHNIQUE: Multidetector CT imaging of the chest was performed using the standard protocol during bolus administration of intravenous contrast. Multiplanar CT image reconstructions and MIPs were obtained to evaluate the vascular anatomy. CONTRAST:  114mL OMNIPAQUE IOHEXOL 350 MG/ML SOLN COMPARISON:  CT angiogram of the chest dated 07/14/2018 FINDINGS: Cardiovascular: Satisfactory opacification of the pulmonary arteries to the segmental level. No evidence of pulmonary embolism. Normal heart size. No pericardial effusion. Mediastinum/Nodes: No enlarged mediastinal, hilar, or axillary lymph nodes. Thyroid gland, trachea, and esophagus demonstrate no significant findings. Lungs/Pleura: Minimal atelectasis posterior medially in the right lower lobe adjacent to thoracic osteophytes. This is not be significant. The lungs are otherwise clear. No effusions. Upper Abdomen: No acute abnormality. Musculoskeletal: No chest wall abnormality. No acute or significant osseous findings. Review of the MIP images confirms the above findings. IMPRESSION: No pulmonary emboli or other significant abnormalities. Electronically Signed   By: Lorriane Shire M.D.   On: 10/21/2018 20:16    Procedures Procedures (including critical care time)  Medications Ordered in ED Medications  iohexol (OMNIPAQUE) 350 MG/ML injection 100 mL (100 mLs Intravenous Contrast Given 10/21/18 1959)  ketorolac (TORADOL) 15 MG/ML injection 15 mg (15 mg Intravenous Given 10/21/18 2026)     Initial Impression / Assessment and Plan / ED Course  I have reviewed the triage vital signs and the nursing notes.  Pertinent labs & imaging results that were available during my care of the patient were reviewed by me and considered in my medical decision making (see chart for details).        Patient presenting for evaluation of chest pain.  Physical exam shows patient appears nontoxic.  Low suspicion for cardiac cause, as pain is mostly on the right  side.  However cardiac labs ordered from triage.  Higher suspicion for possible PE, considering pleural rub and history of DVT not on thinners.  As such, will obtain CTA to rule out PE.  Labs overall reassuring.  Initial troponin 5, repeat in  2 hours.  No leukocytosis.  Creatinine stable.  Chest x-ray viewed and reviewed by me, no pneumonia, pneumothorax, effusion, cardiomegaly.  EKG unchanged from previous.  CTA negative for PE.  Delta Trop 6.  I still have very low suspicion for ACS.  Patient given Toradol, which improved his symptoms.  Discussed possible MSK cause, as pain also extends in his shoulder and is described as a shooting. Will trial voltaren gel.  Discussed importance of follow-up with primary care for further evaluation.  At this time, patient appears safe for discharge.  Return precautions given.  Patient states he understands and agrees to plan.    Final Clinical Impressions(s) / ED Diagnoses   Final diagnoses:  Atypical chest pain  Acute pain of right shoulder    ED Discharge Orders         Ordered    diclofenac sodium (VOLTAREN) 1 % GEL  4 times daily     10/21/18 2034           Franchot Heidelberg, PA-C 10/22/18 0026    Charlesetta Shanks, MD 10/25/18 1528

## 2018-10-26 ENCOUNTER — Ambulatory Visit: Payer: BLUE CROSS/BLUE SHIELD | Admitting: Family Medicine

## 2018-12-09 MED FILL — ATORVASTATIN CALCIUM 40 MG: 40 | 30 days supply | Qty: 30 | Fill #2

## 2018-12-09 MED FILL — metFORMIN HCL 500 MG TABS: 500 | 30 days supply | Qty: 30 | Fill #2

## 2018-12-09 MED FILL — LISINOPRIL 5 MG TABLET: 5 | 30 days supply | Qty: 30 | Fill #3

## 2018-12-13 MED FILL — traMADol HCL 50 MG TABS: 50 | 30 days supply | Qty: 90 | Fill #0

## 2018-12-21 MED FILL — PANTOPRAZOLE SOD DR 40 MG T: 40 | 30 days supply | Qty: 60 | Fill #1

## 2018-12-21 MED FILL — SILDENAFIL CITRATE 50 MG TA: 50 | 30 days supply | Qty: 4 | Fill #1

## 2019-01-07 MED FILL — CYCLOBENZAPRINE 10 MG TAB: 10 | 30 days supply | Qty: 30 | Fill #0

## 2019-01-07 MED FILL — ENOXAPARIN 40 MG/0.4 ML SYR: 40 | 14 days supply | Qty: 6 | Fill #0

## 2019-01-13 ENCOUNTER — Ambulatory Visit: Payer: BLUE CROSS/BLUE SHIELD | Attending: Internal Medicine | Admitting: Internal Medicine

## 2019-01-13 ENCOUNTER — Other Ambulatory Visit: Payer: Self-pay

## 2019-01-13 DIAGNOSIS — R0982 Postnasal drip: Secondary | ICD-10-CM | POA: Diagnosis not present

## 2019-01-13 DIAGNOSIS — M545 Low back pain, unspecified: Secondary | ICD-10-CM

## 2019-01-13 MED ORDER — NAPROXEN 500 MG PO TABS: 500.0000 mg | ORAL_TABLET | Freq: Two times a day (BID) | ORAL | 0 refills | Status: DC | PRN

## 2019-01-13 MED ORDER — CYCLOBENZAPRINE HCL 5 MG PO TABS: 5.0000 mg | ORAL_TABLET | Freq: Two times a day (BID) | ORAL | 0 refills | Status: DC | PRN

## 2019-01-13 MED ORDER — FLUTICASONE PROPIONATE 50 MCG/ACT NA SUSP: 2.0000 | Freq: Every day | NASAL | 2 refills | Status: DC | PRN

## 2019-01-13 MED FILL — CYCLOBENZAPRINE 5 MG TABLET: 5 | 15 days supply | Qty: 30 | Fill #0

## 2019-01-13 MED FILL — FLUTICASONE PROP 50 MCG SPR: 50 | 30 days supply | Qty: 16 | Fill #0

## 2019-01-13 MED FILL — NAPROXEN 500 MG TABLET: 500 | 15 days supply | Qty: 30 | Fill #0

## 2019-01-13 NOTE — Progress Notes (Signed)
Virtual Visit via Telephone Note Due to current restrictions/limitations of in-office visits due to the COVID-19 pandemic, this scheduled clinical appointment was converted to a telehealth visit  I connected with Terry Poole on 01/13/19 at 4:06 p.m by telephone and verified that I am speaking with the correct person using two identifiers. I am in my office.  The patient is at home.  Only the patient and myself participated in this encounter.  I discussed the limitations, risks, security and privacy concerns of performing an evaluation and management service by telephone and the availability of in person appointments. I also discussed with the patient that there may be a patient responsible charge related to this service. The patient expressed understanding and agreed to proceed.   History of Present Illness: Patient with history of type 2 diabetes mellitus (A1c 6.5) Hyperlipidemia, hypertension, gout, bilateral PE and right lower extremity DVT.  PCP is Dr. Margarita Rana.  Pt c/o pain in right lower back x 4 days. No initiating factors that he recalls.  No radiation down the legs.   Tingling feeling over the area. No swelling. No longer on Xarelto  No dysuria but urine looks dark.  No penile dischg.  No fever.  No hx of kidney stones. 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 Taking Tylenol which does not help help  Requests refill on Flonase for postnasal drip Outpatient Encounter Medications as of 01/13/2019  Medication Sig Note  . acetaminophen (TYLENOL) 500 MG tablet Take 2 tablets (1,000 mg total) by mouth every 8 (eight) hours as needed for moderate pain.   Marland Kitchen allopurinol (ZYLOPRIM) 300 MG tablet TAKE 1 TABLET (300 MG TOTAL) BY MOUTH DAILY.   Marland Kitchen atorvastatin (LIPITOR) 40 MG tablet Take 1 tablet (40 mg total) by mouth daily. (Patient not taking: Reported on 10/21/2018)   . colchicine 0.6 MG tablet Take 1 tablet  (0.6 mg) at the onset of a gout attack, may repeat 1 tablet in 2 hours if symptoms persist   . diclofenac sodium (VOLTAREN) 1 % GEL Apply 2 g topically 4 (four) times daily.   . fluticasone (FLONASE) 50 MCG/ACT nasal spray Place 2 sprays into both nostrils daily. (Patient taking differently: Place 2 sprays into both nostrils daily as needed for allergies. )   . furosemide (LASIX) 20 MG tablet Take 1 tablet (20 mg total) by mouth daily. (Patient not taking: Reported on 10/21/2018)   . lisinopril (ZESTRIL) 5 MG tablet Take 1 tablet (5 mg total) by mouth daily.   . metFORMIN (GLUCOPHAGE) 500 MG tablet Take 1 tablet (500 mg total) by mouth daily with breakfast.   . pantoprazole (PROTONIX) 40 MG tablet Take 1 tablet (40 mg total) by mouth 2 (two) times daily. (Patient not taking: Reported on 10/21/2018)   . VIAGRA 50 MG tablet TAKE 1 TABLET BY MOUTH DAILY AS NEEDED FOR ERECTILE DYSFUNCTION. AT LEAST 24 HOURS BETWEEN DOSES (Patient not taking: No sig reported)   . XARELTO 20 MG TABS tablet TAKE 1 TABLET BY MOUTH DAILY WITH SUPPER. (Patient taking differently: Take 20 mg by mouth daily with supper. ) 10/21/2018: Pt currently stopped for procedure tomorrow 9/18 will resume at later date.   No facility-administered encounter medications on file as of 01/13/2019.    Observations/Objective: No direct observation done as this was a telephone encounter  Assessment and Plan: 1. Acute right-sided low back pain without sciatica Possibly musculoskeletal in nature.  We will give a course of Naprosyn and Flexeril.  I warned that Flexeril can cause drowsiness.  Also advised using a heating pad to the lower back.  If no improvement in symptoms or any worsening he needs to follow-up with his PCP.  Avoid heavy lifting for the next several days.  He declines a note for work. - cyclobenzaprine (FLEXERIL) 5 MG tablet; Take 1 tablet (5 mg total) by mouth 2 (two) times daily as needed for muscle spasms.  Dispense: 30 tablet;  Refill: 0 - naproxen (NAPROSYN) 500 MG tablet; Take 1 tablet (500 mg total) by mouth 2 (two) times daily as needed for moderate pain (take with food).  Dispense: 30 tablet; Refill: 0  2. Postnasal drip - fluticasone (FLONASE) 50 MCG/ACT nasal spray; Place 2 sprays into both nostrils daily as needed for allergies.  Dispense: 9.9 mL; Refill: 2   Follow Up Instructions:    I discussed the assessment and treatment plan with the patient. The patient was provided an opportunity to ask questions and all were answered. The patient agreed with the plan and demonstrated an understanding of the instructions.   The patient was advised to call back or seek an in-person evaluation if the symptoms worsen or if the condition fails to improve as anticipated.  I provided 18 minutes of non-face-to-face time during this encounter.   Karle Plumber, MD

## 2019-02-01 ENCOUNTER — Other Ambulatory Visit: Payer: Self-pay | Admitting: Internal Medicine

## 2019-02-01 DIAGNOSIS — M545 Low back pain, unspecified: Secondary | ICD-10-CM

## 2019-02-01 MED FILL — ALLOPURINOL 300 MG TAB: 300 | 30 days supply | Qty: 30 | Fill #3

## 2019-02-02 ENCOUNTER — Other Ambulatory Visit: Payer: Self-pay | Admitting: Family Medicine

## 2019-02-02 DIAGNOSIS — I10 Essential (primary) hypertension: Secondary | ICD-10-CM

## 2019-02-02 MED FILL — LISINOPRIL 5 MG TABLET: 5 | 30 days supply | Qty: 30 | Fill #0

## 2019-02-03 MED FILL — CYCLOBENZAPRINE 5 MG TABLET: 5 | 15 days supply | Qty: 30 | Fill #0

## 2019-02-12 ENCOUNTER — Other Ambulatory Visit: Payer: Self-pay

## 2019-02-12 ENCOUNTER — Encounter (HOSPITAL_COMMUNITY): Payer: Self-pay | Admitting: Emergency Medicine

## 2019-02-12 ENCOUNTER — Emergency Department (HOSPITAL_COMMUNITY)
Admission: EM | Admit: 2019-02-12 | Discharge: 2019-02-12 | Disposition: A | Discharge status: Home / Self Care | Payer: BLUE CROSS/BLUE SHIELD | Attending: Emergency Medicine | Admitting: Emergency Medicine

## 2019-02-12 ENCOUNTER — Emergency Department (HOSPITAL_COMMUNITY): Payer: BLUE CROSS/BLUE SHIELD

## 2019-02-12 ENCOUNTER — Emergency Department (HOSPITAL_BASED_OUTPATIENT_CLINIC_OR_DEPARTMENT_OTHER): Payer: BLUE CROSS/BLUE SHIELD

## 2019-02-12 DIAGNOSIS — K625 Hemorrhage of anus and rectum: Secondary | ICD-10-CM | POA: Diagnosis not present

## 2019-02-12 DIAGNOSIS — Z79899 Other long term (current) drug therapy: Secondary | ICD-10-CM | POA: Insufficient documentation

## 2019-02-12 DIAGNOSIS — M79604 Pain in right leg: Secondary | ICD-10-CM | POA: Diagnosis not present

## 2019-02-12 DIAGNOSIS — Z96651 Presence of right artificial knee joint: Secondary | ICD-10-CM | POA: Diagnosis not present

## 2019-02-12 DIAGNOSIS — E119 Type 2 diabetes mellitus without complications: Secondary | ICD-10-CM | POA: Insufficient documentation

## 2019-02-12 DIAGNOSIS — I10 Essential (primary) hypertension: Secondary | ICD-10-CM | POA: Diagnosis not present

## 2019-02-12 DIAGNOSIS — R1084 Generalized abdominal pain: Secondary | ICD-10-CM | POA: Diagnosis not present

## 2019-02-12 DIAGNOSIS — F17210 Nicotine dependence, cigarettes, uncomplicated: Secondary | ICD-10-CM | POA: Diagnosis not present

## 2019-02-12 DIAGNOSIS — M79609 Pain in unspecified limb: Secondary | ICD-10-CM | POA: Diagnosis not present

## 2019-02-12 DIAGNOSIS — Z7901 Long term (current) use of anticoagulants: Secondary | ICD-10-CM | POA: Diagnosis not present

## 2019-02-12 DIAGNOSIS — Z7984 Long term (current) use of oral hypoglycemic drugs: Secondary | ICD-10-CM | POA: Diagnosis not present

## 2019-02-12 LAB — COMPREHENSIVE METABOLIC PANEL
ALT: 29 U/L (ref 0–44)
AST: 17 U/L (ref 15–41)
Albumin: 3.8 g/dL (ref 3.5–5.0)
Alkaline Phosphatase: 132 U/L — ABNORMAL HIGH (ref 38–126)
Anion gap: 9 (ref 5–15)
BUN: 12 mg/dL (ref 6–20)
CO2: 23 mmol/L (ref 22–32)
Calcium: 9.1 mg/dL (ref 8.9–10.3)
Chloride: 106 mmol/L (ref 98–111)
Creatinine, Ser: 0.88 mg/dL (ref 0.61–1.24)
GFR calc Af Amer: >60 mL/min (ref >60)
GFR calc non Af Amer: >60 mL/min (ref >60)
Glucose, Bld: 163 mg/dL — ABNORMAL HIGH (ref 70–99)
Potassium: 3.7 mmol/L (ref 3.5–5.1)
Sodium: 138 mmol/L (ref 135–145)
Total Bilirubin: 0.6 mg/dL (ref 0.3–1.2)
Total Protein: 7.1 g/dL (ref 6.5–8.1)

## 2019-02-12 LAB — POC OCCULT BLOOD, ED: Fecal Occult Bld: POSITIVE — AB

## 2019-02-12 LAB — CBC
HCT: 38.3 % — ABNORMAL LOW (ref 39.0–52.0)
Hemoglobin: 12 g/dL — ABNORMAL LOW (ref 13.0–17.0)
MCH: 27.8 pg (ref 26.0–34.0)
MCHC: 31.3 g/dL (ref 30.0–36.0)
MCV: 88.7 fL (ref 80.0–100.0)
Platelets: 264 10*3/uL (ref 150–400)
RBC: 4.32 MIL/uL (ref 4.22–5.81)
RDW: 15.2 % (ref 11.5–15.5)
WBC: 8.1 10*3/uL (ref 4.0–10.5)
nRBC: 0 % (ref 0.0–0.2)

## 2019-02-12 LAB — TYPE AND SCREEN
ABO/RH(D): B POS
Antibody Screen: NEGATIVE

## 2019-02-12 LAB — ABO/RH: ABO/RH(D): B POS

## 2019-02-12 MED ORDER — IOHEXOL 300 MG/ML  SOLN
100.0000 mL | Freq: Once | INTRAMUSCULAR | Status: AC | PRN
  Administered 2019-02-12: 100 mL via INTRAVENOUS

## 2019-02-12 MED ORDER — PANTOPRAZOLE SODIUM 40 MG IV SOLR
40.0000 mg | Freq: Once | INTRAVENOUS | Status: AC
  Administered 2019-02-12: 40 mg via INTRAVENOUS
  Filled 2019-02-12: qty 40

## 2019-02-12 MED ORDER — MORPHINE SULFATE (PF) 4 MG/ML IV SOLN
4.0000 mg | Freq: Once | INTRAVENOUS | Status: AC
  Administered 2019-02-12: 4 mg via INTRAVENOUS
  Filled 2019-02-12: qty 1

## 2019-02-12 NOTE — ED Triage Notes (Signed)
C/o bloody diarrhea x 2 days with generalized abd pain.  Pt had R knee surgery early December and also reports pain to R posterior leg (upper and lower leg) x 2-3 weeks.

## 2019-02-12 NOTE — ED Notes (Signed)
Patient verbalizes understanding of discharge instructions. Opportunity for questioning and answers were provided. Armband removed by staff, pt discharged from ED ambulatory to home.  

## 2019-02-12 NOTE — Discharge Instructions (Addendum)
Your laboratory results were within normal limits.    You will need to make an appointment with Terry Poole, Dr. Fuller Plan to further evaluate your rectal bleeding.   The ultrasound of your leg did not show any clots, please continue physical therapy as prescribed.

## 2019-02-12 NOTE — ED Provider Notes (Addendum)
[facility] EMERGENCY DEPARTMENT Provider Note   CSN: [id number] Arrival date & time: 02/12/19  [id number]     History Chief Complaint  Patient presents with  . Rectal Bleeding  . Leg Pain    Terry Poole is a 54 y.o. male.  54 y.o male with a PMH of DVT, OA, HTN, gout presents to the ED with a chief complaint of rectal bleeding along with right leg pain.  Patient reports of rectal bleeding began 2 days ago, he reports he is noted several episodes of blood in his stool, reports he feels like food particles remain in the toilet.  He also endorses left-sided abdominal pain, reports this feels like a pulling sensation to the left upper quadrant.  He does not have any prior history medical history to his abdomen.  He is currently taking subcu Lovenox injections after his right knee replacement last month.  Patient also endorses continuing oxycodone as he has now started therapy for rehabilitation of his right knee.  He also reports right knee pain and swelling.  This has been ongoing, he did have a total right knee replacement last month.  She has been receiving physical therapy, his next appointment is 02/17/2018.  He denies any chest pain, shortness of breath, weakness.  No prior history of GI bleed.  EKG has been reviewed without any blood thinners.   The history is provided by the patient and medical records.  Rectal Bleeding Associated symptoms: abdominal pain   Associated symptoms: no fever and no vomiting   Leg Pain Associated symptoms: no back pain and no fever        Past Medical History:  Diagnosis Date  . Bronchitis   . Bronchitis   . DVT (deep venous thrombosis) ([place])   . Gout   . Hammertoe of second toe of right foot   . Smoker     Patient Active Problem List   Diagnosis Date Noted  . Osteoarthritis of right knee 12/10/2017  . DVT (deep venous thrombosis) ([place]) 10/13/2017  . Pulmonary embolism ([place]) 10/02/2017  . AKI (acute kidney injury) ([place])  09/22/2017  . Chest pain 09/22/2017  . Hypertension 11/19/2016  . Type 2 diabetes mellitus ([place]) 08/19/2016  . Hyperlipidemia 08/19/2016  . Back pain 05/13/2016  . Hammertoe of second toe of right foot 03/06/2016  . Onychomycosis 04/16/2015  . Pseudofolliculitis barbae [id number]  . Gout 03/28/2015    Past Surgical History:  Procedure Laterality Date  . HAMMER TOE SURGERY Right 03/10/2016   Procedure: 2ND RIGHT HAMMER TOE CORRECTION;  Surgeon: Landis Martins, DPM;  Location: [place];  Service: Podiatry;  Laterality: Right;  . MASS EXCISION Right 03/10/2016   Procedure: EXCISION OF CALLUS 2ND RIGHT TOE;  Surgeon: Landis Martins, DPM;  Location: [place];  Service: Podiatry;  Laterality: Right;  . NO PAST SURGERIES         Family History  Problem Relation Age of Onset  . Heart disease Neg Hx   . Kidney disease Neg Hx     Social History   Tobacco Use  . Smoking status: Current Every Day Smoker    Packs/day: 0.25    Types: Cigarettes  . Smokeless tobacco: Never Used  . Tobacco comment: 8 cigs daily  Substance Use Topics  . Alcohol use: No  . Drug use: No    Home Medications Prior to Admission medications   Medication Sig Start Date End Date Taking? Authorizing Provider  acetaminophen (TYLENOL) 500  MG tablet Take 2 tablets (1,000 mg total) by mouth every 8 (eight) hours as needed for moderate pain. 03/23/18   Law, Bea Graff, PA-C  allopurinol (ZYLOPRIM) 300 MG tablet TAKE 1 TABLET (300 MG TOTAL) BY MOUTH DAILY. 06/30/18   [name], MD  atorvastatin (LIPITOR) 40 MG tablet Take 1 tablet (40 mg total) by mouth daily. Patient not taking: Reported on 10/21/2018 07/27/18   [name], MD  colchicine 0.6 MG tablet Take 1 tablet (0.6 mg) at the onset of a gout attack, may repeat 1 tablet in 2 hours if symptoms persist 07/27/18   [name], MD  cyclobenzaprine (FLEXERIL) 5 MG tablet TAKE 1 TABLET (5 MG TOTAL) BY MOUTH 2 (TWO) TIMES  DAILY AS NEEDED FOR MUSCLE SPASMS. 02/03/19   [name], MD  diclofenac sodium (VOLTAREN) 1 % GEL Apply 2 g topically 4 (four) times daily. 10/21/18   Caccavale, Sophia, PA-C  fluticasone (FLONASE) 50 MCG/ACT nasal spray Place 2 sprays into both nostrils daily as needed for allergies. 01/13/19   Ladell Pier, MD  furosemide (LASIX) 20 MG tablet Take 1 tablet (20 mg total) by mouth daily. Patient not taking: Reported on 10/21/2018 07/27/18   [name], MD  lisinopril (ZESTRIL) 5 MG tablet TAKE 1 TABLET (5 MG TOTAL) BY MOUTH DAILY. 02/02/19   [name], MD  metFORMIN (GLUCOPHAGE) 500 MG tablet Take 1 tablet (500 mg total) by mouth daily with breakfast. 07/27/18   [name], MD  naproxen (NAPROSYN) 500 MG tablet Take 1 tablet (500 mg total) by mouth 2 (two) times daily as needed for moderate pain (take with food). 01/13/19   Ladell Pier, MD  pantoprazole (PROTONIX) 40 MG tablet Take 1 tablet (40 mg total) by mouth 2 (two) times daily. Patient not taking: Reported on 10/21/2018 03/16/18   [name], MD  VIAGRA 50 MG tablet TAKE 1 TABLET BY MOUTH DAILY AS NEEDED FOR ERECTILE DYSFUNCTION. AT LEAST 24 HOURS BETWEEN DOSES Patient not taking: No sig reported 08/05/18   [name], MD  XARELTO 20 MG TABS tablet TAKE 1 TABLET BY MOUTH DAILY WITH SUPPER. Patient taking differently: Take 20 mg by mouth daily with supper.  08/13/18   [name], MD    Allergies    Vicodin [hydrocodone-acetaminophen]  Review of Systems   Review of Systems  Constitutional: Negative for chills and fever.  HENT: Negative for ear pain and sore throat.   Eyes: Negative for pain and visual disturbance.  Respiratory: Negative for cough and shortness of breath.   Cardiovascular: Positive for leg swelling. Negative for chest pain and palpitations.  Gastrointestinal: Positive for abdominal pain, blood in stool and hematochezia. Negative for nausea and vomiting.  Genitourinary: Negative  for dysuria and hematuria.  Musculoskeletal: Positive for arthralgias and joint swelling. Negative for back pain.  Skin: Negative for color change and rash.  Neurological: Negative for seizures and syncope.  All other systems reviewed and are negative.   Physical Exam Updated Vital Signs BP (!) 137/100   Pulse 81   Temp 98.4 F (36.9 C) (Oral)   Resp 17   SpO2 95%   Physical Exam Vitals and nursing note reviewed.  Constitutional:      Appearance: He is well-developed.  HENT:     Head: Normocephalic and atraumatic.  Eyes:     General: No scleral icterus.    Pupils: Pupils are equal, round, and reactive to light.  Cardiovascular:     Pulses:  Dorsalis pedis pulses are 2+ on the right side and 2+ on the left side.       Posterior tibial pulses are 2+ on the right side and 2+ on the left side.     Heart sounds: Normal heart sounds.  Pulmonary:     Effort: Pulmonary effort is normal.     Breath sounds: Normal breath sounds. No wheezing.     Comments: Lungs are clear to auscultation without any wheezing, rhonchi, rales. Chest:     Chest wall: No tenderness.  Abdominal:     General: Bowel sounds are normal. There is distension.     Palpations: Abdomen is soft.     Tenderness: There is generalized abdominal tenderness. There is no right CVA tenderness or left CVA tenderness.     Comments: Mild generalized tenderness throughout the whole abdomen without focal.  No visible hernias are palpated.  Musculoskeletal:        General: No tenderness or deformity.     Cervical back: Normal range of motion.     Right lower leg: 1+ Edema present.  Feet:     Comments: Bilateral pulses are symmetric and equal.  Mild pitting edema to the right leg. Skin:    General: Skin is warm and dry.  Neurological:     Mental Status: He is alert and oriented to person, place, and time.     ED Results / Procedures / Treatments   Labs (all labs ordered are listed, but only abnormal results  are displayed) Labs Reviewed  COMPREHENSIVE METABOLIC PANEL - Abnormal; Notable for the following components:      Result Value   Glucose, Bld 163 (*)    Alkaline Phosphatase 132 (*)    All other components within normal limits  CBC - Abnormal; Notable for the following components:   Hemoglobin 12.0 (*)    HCT 38.3 (*)    All other components within normal limits  POC OCCULT BLOOD, ED - Abnormal; Notable for the following components:   Fecal Occult Bld POSITIVE (*)    All other components within normal limits  TYPE AND SCREEN  ABO/RH    EKG None  Radiology CT ABDOMEN PELVIS W CONTRAST  Result Date: 02/12/2019 CLINICAL DATA:  Bloody diarrhea x2 days with generalized abdominal pain. EXAM: CT ABDOMEN AND PELVIS WITH CONTRAST TECHNIQUE: Multidetector CT imaging of the abdomen and pelvis was performed using the standard protocol following bolus administration of intravenous contrast. CONTRAST:  120mL OMNIPAQUE IOHEXOL 300 MG/ML  SOLN COMPARISON:  11/11/2017. FINDINGS: Lower chest: The lung bases are clear. The heart size is normal. Hepatobiliary: There is decreased hepatic attenuation suggestive of hepatic steatosis. Normal gallbladder.There is no biliary ductal dilation. Pancreas: Normal contours without ductal dilatation. No peripancreatic fluid collection. Spleen: No splenic laceration or hematoma. Adrenals/Urinary Tract: --Adrenal glands: No adrenal hemorrhage. --Right kidney/ureter: No hydronephrosis or perinephric hematoma. --Left kidney/ureter: No hydronephrosis or perinephric hematoma. --Urinary bladder: Bladder is decompressed. Stomach/Bowel: --Stomach/Duodenum: No hiatal hernia or other gastric abnormality. Normal duodenal course and caliber. --Small bowel: No dilatation or inflammation. --Colon: There is scattered colonic diverticula. There is no CT evidence for diverticulitis. There is liquid stool in the right hemicolon. There is no CT evidence for diverticulitis. --Appendix: Normal.  Vascular/Lymphatic: Atherosclerotic calcification is present within the non-aneurysmal abdominal aorta, without hemodynamically significant stenosis. --No retroperitoneal lymphadenopathy. --No mesenteric lymphadenopathy. --No pelvic or inguinal lymphadenopathy. Reproductive: Unremarkable Other: No ascites or free air. The abdominal wall is normal. Musculoskeletal. No acute displaced fractures. IMPRESSION:  1. No CT evidence for colitis or diverticulitis. 2. Liquid stool in the right hemicolon consistent with diarrhea. There is scattered colonic diverticula without CT evidence for diverticulitis 3. Hepatic steatosis. Aortic Atherosclerosis (ICD10-I70.0). Electronically Signed   By: Constance Holster M.D.   On: 02/12/2019 15:28   VAS [place] LOWER EXTREMITY VENOUS (DVT) (ONLY [facility] & [facility] 7a-7p)  Result Date: 02/12/2019  Lower Venous Study Indications: Pain.  Risk Factors: History of PE 10/02/2017. Comparison Study: Prior study done 09/22/18 Performing Technologist: Sharion Dove RVS  Examination Guidelines: A complete evaluation includes B-mode imaging, spectral Doppler, color Doppler, and power Doppler as needed of all accessible portions of each vessel. Bilateral testing is considered an integral part of a complete examination. Limited examinations for reoccurring indications may be performed as noted.  +---------+---------------+---------+-----------+----------+--------------+ RIGHT    CompressibilityPhasicitySpontaneityPropertiesThrombus Aging +---------+---------------+---------+-----------+----------+--------------+ CFV      Full           Yes      Yes                                 +---------+---------------+---------+-----------+----------+--------------+ SFJ      Full                                                        +---------+---------------+---------+-----------+----------+--------------+ FV Prox  Full                                                         +---------+---------------+---------+-----------+----------+--------------+ FV Mid   Full                                                        +---------+---------------+---------+-----------+----------+--------------+ FV DistalFull                                                        +---------+---------------+---------+-----------+----------+--------------+ PFV      Full                                                        +---------+---------------+---------+-----------+----------+--------------+ POP      Partial        Yes      Yes                  Chronic        +---------+---------------+---------+-----------+----------+--------------+ PTV      Full                                                        +---------+---------------+---------+-----------+----------+--------------+  PERO     Full                                                        +---------+---------------+---------+-----------+----------+--------------+   +----+---------------+---------+-----------+----------+--------------+ LEFTCompressibilityPhasicitySpontaneityPropertiesThrombus Aging +----+---------------+---------+-----------+----------+--------------+ CFV Full           Yes      Yes                                 +----+---------------+---------+-----------+----------+--------------+     Summary: Right: Findings consistent with chronic deep vein thrombosis involving the right popliteal vein. Left: No evidence of common femoral vein obstruction.  *See table(s) above for measurements and observations.    Preliminary     Procedures Procedures (including critical care time)  Medications Ordered in ED Medications  morphine 4 MG/ML injection 4 mg (has no administration in time range)  pantoprazole (PROTONIX) injection 40 mg (40 mg Intravenous Given 02/12/19 1312)  morphine 4 MG/ML injection 4 mg (4 mg Intravenous Given 02/12/19 1310)  iohexol (OMNIPAQUE) 300 MG/ML solution  100 mL (100 mLs Intravenous Contrast Given 02/12/19 1508)    ED Course  I have reviewed the triage vital signs and the nursing notes.  Pertinent labs & imaging results that were available during my care of the patient were reviewed by me and considered in my medical decision making (see chart for details).  Clinical Course as of Feb 12 1532  Sat Feb 12, 2019  1036 Hemoglobin(!): 12.0 [JS]  1204 Fecal Occult Blood, POC(!): POSITIVE [JS]    Clinical Course User Index [JS] Janeece Fitting, PA-C   MDM Rules/Calculators/A&P     Patient with a past medical history of DVT on Xarelto, presents to the ED with a chief complaint of rectal bleeding along with right knee pain.  Patient noted the rectal bleeding several days ago, reports he sees blood around his stool but not on the toilet, he also reports he is able to visualize food that has not been processed on the toilet.  He also endorses some left-sided abdominal pain, this feels like a pulling sensation without any radiation.  Reports his last bowel movement was prior to arrival.  He denies any diarrhea or constipation.  According to his chart, he was previously placed on oxycodone to help with his pain after a total knee replacement in the month of December 2020.  Abdomen is otherwise soft, does report tenderness along the left upper quadrant, generalized without focal point.  Bowel sounds are increased.  Vitals are within normal limits, he is afebrile, no tachycardia, 99% on room air. Ortho exam performed by me, no gross blood on my exam, small hemorrhoid noted to the external aspect without any bleeding.  CBC without any leukocytosis, hemoglobin slightly decreased since his visit 3 months ago however patient did just have total knee replacement.  Electrolytes are within normal limits, glucose is slightly elevated, LFTs unremarkable.  Creatinine level is within normal limits.-Screen was obtained along with bilateral IVs were placed on patient.   Patient was given morphine along with Protonix to help with pain.  We will touch base with GI to further pain recommendations.  Denies any prior history of diverticulitis, has had a colonoscopy in the past and was followed by [facility].  Right  knee pain is more so behind the right knee, with some radiation into his calf and toes.  He is neurovascularly intact, there is 1+ pitting edema to the right lower extremity.  Will obtain vascular study to further rule out DVT.  Vascular study showed: Right: Findings consistent with chronic deep vein thrombosis involving the right popliteal vein.  Left: No evidence of common femoral vein obstruction.     Spoke to [facility], she recommended outpatient follow-up.  Patient will need to call in order to schedule an appointment.  02:15 PM I have personally called CT, spoke to Legrand Como, patient is next in line for CT imaging after code stroke.  CT results were discussed with patient at length, he is advised to follow-up with [facility] in an outpatient basis.  He will also continue physical therapy as recommended for his total right knee replacement.  Will provide patient with 1 more round of pain medication, he is aware he will not be going home on any prescription for pain.  Patient desires management, return precautions discussed at length.   Portions of this note were generated with [employer]. Dictation errors may occur despite best attempts at proofreading.  Final Clinical Impression(s) / ED Diagnoses Final diagnoses:  Rectal bleeding  Right leg pain    Rx / DC Orders ED Discharge Orders    None       Janeece Fitting, PA-C 02/12/19 [place], Lewanna Petrak, PA-C 02/12/19 1534    Lucrezia Starch, MD 02/14/19 [phone]

## 2019-02-12 NOTE — ED Notes (Signed)
Hooked patient up to the monitor gave patient a warm blanket

## 2019-02-12 NOTE — Progress Notes (Signed)
VASCULAR LAB PRELIMINARY  PRELIMINARY  PRELIMINARY  PRELIMINARY  Right lower extremity venous duplex completed.    Preliminary report:  See CV proc for preliminary results.  Gave report to [employer], PA-C  Maryama Kuriakose, RVT 02/12/2019, 1:12 PM

## 2019-02-14 ENCOUNTER — Ambulatory Visit: Payer: BLUE CROSS/BLUE SHIELD | Attending: Family Medicine | Admitting: Family Medicine

## 2019-02-14 ENCOUNTER — Encounter: Payer: Self-pay | Admitting: Family Medicine

## 2019-02-14 DIAGNOSIS — J0110 Acute frontal sinusitis, unspecified: Secondary | ICD-10-CM | POA: Diagnosis not present

## 2019-02-14 DIAGNOSIS — I10 Essential (primary) hypertension: Secondary | ICD-10-CM | POA: Diagnosis not present

## 2019-02-14 DIAGNOSIS — E119 Type 2 diabetes mellitus without complications: Secondary | ICD-10-CM | POA: Diagnosis not present

## 2019-02-14 DIAGNOSIS — M1A0720 Idiopathic chronic gout, left ankle and foot, without tophus (tophi): Secondary | ICD-10-CM | POA: Diagnosis not present

## 2019-02-14 MED ORDER — AMOXICILLIN 500 MG PO CAPS: 500.0000 mg | ORAL_CAPSULE | Freq: Three times a day (TID) | ORAL | 0 refills | Status: DC

## 2019-02-14 MED ORDER — ATORVASTATIN CALCIUM 40 MG PO TABS: 40.0000 mg | ORAL_TABLET | Freq: Every day | ORAL | 3 refills | Status: DC

## 2019-02-14 MED ORDER — ALLOPURINOL 300 MG PO TABS: 300.0000 mg | ORAL_TABLET | Freq: Every day | ORAL | 3 refills | Status: DC

## 2019-02-14 MED ORDER — METFORMIN HCL 500 MG PO TABS: 500.0000 mg | ORAL_TABLET | Freq: Every day | ORAL | 3 refills | Status: DC

## 2019-02-14 MED ORDER — LISINOPRIL 5 MG PO TABS: 5.0000 mg | ORAL_TABLET | Freq: Every day | ORAL | 3 refills | Status: DC

## 2019-02-14 MED FILL — metFORMIN HCL 500 MG TABS: 500 | 30 days supply | Qty: 30 | Fill #0

## 2019-02-14 MED FILL — ATORVASTATIN CALCIUM 40 MG: 40 | 30 days supply | Qty: 30 | Fill #0

## 2019-02-14 MED FILL — AMOXICILLIN 500 MG CAPSULE: 500 | 10 days supply | Qty: 30 | Fill #0

## 2019-02-14 NOTE — Progress Notes (Signed)
Patient has been called and DOB has been verified. Patient has been screened and transferred to PCP to start phone visit.  Eyes are puffy, nose hurting, congestion in chest.

## 2019-02-14 NOTE — Progress Notes (Signed)
Virtual Visit via Telephone Note  I connected with Terry Poole, on 02/14/2019 at 11:30 AM by telephone due to the COVID-19 pandemic and verified that I am speaking with the correct person using two identifiers.   Consent: I discussed the limitations, risks, security and privacy concerns of performing an evaluation and management service by telephone and the availability of in person appointments. I also discussed with the patient that there may be a patient responsible charge related to this service. The patient expressed understanding and agreed to proceed.   Location of Patient: Home  Location of Provider: Clinic   Persons participating in Telemedicine visit: Mickell Pichette [place]-CMA Dr. Margarita Rana     History of Present Illness: Terry Poole is a 54 year old male with a history of type 2 diabetes mellitus (A1c 6.5) Hyperlipidemia, hypertension, gout, bilateral PE and right lower extremity DVT (diagnosed in 09/2017), s/p R TKR who presents today for a follow-up visit. His left knee surgery will be in the next 10 weeks.  Complains of puffiness of his eyes, drainage in his throat for the last couple of weeks.He has some cough with yellowish mucus, denies presence of fever. He does have frontal sinus pressure. Using Flonase with no relief.  2 days ago he had an ED visit for lower GI bleed and will be seeing them on 03/03/19.  He has had no repeat GI bleed.  Remains on Xarelto chronically. Endorses compliance with his diabetic medications and antihypertensive.  He has had no gout flares.  Past Medical History:  Diagnosis Date  . Bronchitis   . Bronchitis   . DVT (deep venous thrombosis) ([place])   . Gout   . Hammertoe of second toe of right foot   . Smoker    Allergies  Allergen Reactions  . Vicodin [Hydrocodone-Acetaminophen] Hives    Patient stated he is no longer allergic to vicodin, patient stated that he was allergic about 12 years ago.     Current Outpatient  Medications on File Prior to Visit  Medication Sig Dispense Refill  . acetaminophen (TYLENOL) 500 MG tablet Take 2 tablets (1,000 mg total) by mouth every 8 (eight) hours as needed for moderate pain. 30 tablet 0  . allopurinol (ZYLOPRIM) 300 MG tablet TAKE 1 TABLET (300 MG TOTAL) BY MOUTH DAILY. 30 tablet 3  . atorvastatin (LIPITOR) 40 MG tablet Take 1 tablet (40 mg total) by mouth daily. 30 tablet 3  . colchicine 0.6 MG tablet Take 1 tablet (0.6 mg) at the onset of a gout attack, may repeat 1 tablet in 2 hours if symptoms persist 30 tablet 1  . cyclobenzaprine (FLEXERIL) 5 MG tablet TAKE 1 TABLET (5 MG TOTAL) BY MOUTH 2 (TWO) TIMES DAILY AS NEEDED FOR MUSCLE SPASMS. 30 tablet 0  . diclofenac sodium (VOLTAREN) 1 % GEL Apply 2 g topically 4 (four) times daily. 100 g 0  . fluticasone (FLONASE) 50 MCG/ACT nasal spray Place 2 sprays into both nostrils daily as needed for allergies. 9.9 mL 2  . lisinopril (ZESTRIL) 5 MG tablet TAKE 1 TABLET (5 MG TOTAL) BY MOUTH DAILY. 30 tablet 3  . metFORMIN (GLUCOPHAGE) 500 MG tablet Take 1 tablet (500 mg total) by mouth daily with breakfast. 30 tablet 3  . naproxen (NAPROSYN) 500 MG tablet Take 1 tablet (500 mg total) by mouth 2 (two) times daily as needed for moderate pain (take with food). 30 tablet 0  . furosemide (LASIX) 20 MG tablet Take 1 tablet (20 mg total) by mouth daily. (  Patient not taking: Reported on 10/21/2018) 5 tablet 0  . pantoprazole (PROTONIX) 40 MG tablet Take 1 tablet (40 mg total) by mouth 2 (two) times daily. (Patient not taking: Reported on 10/21/2018) 60 tablet 3  . VIAGRA 50 MG tablet TAKE 1 TABLET BY MOUTH DAILY AS NEEDED FOR ERECTILE DYSFUNCTION. AT LEAST 24 HOURS BETWEEN DOSES (Patient not taking: No sig reported) 10 tablet 2  . XARELTO 20 MG TABS tablet TAKE 1 TABLET BY MOUTH DAILY WITH SUPPER. (Patient not taking: No sig reported) 30 tablet 2   No current facility-administered medications on file prior to visit.     Observations/Objective: Awake, alert, oriented x3 Not in acute distress  Lab Results  Component Value Date   HGBA1C 6.6 07/27/2018    Assessment and Plan: 1. Type 2 diabetes mellitus without complication, without long-term current use of insulin (HCC) Controlled with A1c of 6.6 He is due for an A1c which will be done at next in person visit Continue diabetic diet and other lifestyle modifications. - Hemoglobin A1c; Future - Lipid panel; Future - metFORMIN (GLUCOPHAGE) 500 MG tablet; Take 1 tablet (500 mg total) by mouth daily with breakfast.  Dispense: 30 tablet; Refill: 3 - atorvastatin (LIPITOR) 40 MG tablet; Take 1 tablet (40 mg total) by mouth daily.  Dispense: 30 tablet; Refill: 3  2. Acute non-recurrent frontal sinusitis - amoxicillin (AMOXIL) 500 MG capsule; Take 1 capsule (500 mg total) by mouth 3 (three) times daily.  Dispense: 30 capsule; Refill: 0  3. Essential hypertension Stable Counseled on low sodium diet - lisinopril (ZESTRIL) 5 MG tablet; Take 1 tablet (5 mg total) by mouth daily.  Dispense: 30 tablet; Refill: 3  4. Chronic idiopathic gout involving toe of left foot without tophus No recent flares - allopurinol (ZYLOPRIM) 300 MG tablet; Take 1 tablet (300 mg total) by mouth daily.  Dispense: 30 tablet; Refill: 3   Follow Up Instructions: Return in about 3 months (around 05/15/2019), or if symptoms worsen or fail to improve, for Chronic medical conditions.    I discussed the assessment and treatment plan with the patient. The patient was provided an opportunity to ask questions and all were answered. The patient agreed with the plan and demonstrated an understanding of the instructions.   The patient was advised to call back or seek an in-person evaluation if the symptoms worsen or if the condition fails to improve as anticipated.     I provided 31 minutes total of non-face-to-face time during this encounter including median intraservice time, reviewing  previous notes, investigations, ordering medications, medical decision making, coordinating care and patient verbalized understanding at the end of the visit.     [name], MD, FAAFP. [facility] [place], [place]   02/14/2019, 11:30 AM

## 2019-02-18 ENCOUNTER — Telehealth: Payer: Self-pay | Admitting: Family Medicine

## 2019-03-03 ENCOUNTER — Ambulatory Visit: Payer: BLUE CROSS/BLUE SHIELD | Admitting: Physician Assistant

## 2019-03-14 ENCOUNTER — Ambulatory Visit: Payer: BLUE CROSS/BLUE SHIELD | Attending: Family Medicine

## 2019-03-14 ENCOUNTER — Other Ambulatory Visit: Payer: Self-pay

## 2019-03-14 DIAGNOSIS — E119 Type 2 diabetes mellitus without complications: Secondary | ICD-10-CM

## 2019-03-15 LAB — HEMOGLOBIN A1C
Est. average glucose Bld gHb Est-mCnc: 148 mg/dL
Hgb A1c MFr Bld: 6.8 % — ABNORMAL HIGH (ref 4.8–5.6)

## 2019-03-15 LAB — LIPID PANEL
Chol/HDL Ratio: 2.9 ratio (ref 0.0–5.0)
Cholesterol, Total: 128 mg/dL (ref 100–199)
HDL: 44 mg/dL (ref >39)
LDL Chol Calc (NIH): 36 mg/dL (ref 0–99)
Triglycerides: 330 mg/dL — ABNORMAL HIGH (ref 0–149)
VLDL Cholesterol Cal: 48 mg/dL — ABNORMAL HIGH (ref 5–40)

## 2019-03-17 ENCOUNTER — Telehealth: Payer: Self-pay

## 2019-03-17 ENCOUNTER — Ambulatory Visit: Payer: BLUE CROSS/BLUE SHIELD | Attending: Family Medicine | Admitting: Physician Assistant

## 2019-03-17 DIAGNOSIS — I10 Essential (primary) hypertension: Secondary | ICD-10-CM

## 2019-03-17 DIAGNOSIS — E119 Type 2 diabetes mellitus without complications: Secondary | ICD-10-CM

## 2019-03-17 DIAGNOSIS — R040 Epistaxis: Secondary | ICD-10-CM | POA: Diagnosis not present

## 2019-03-17 NOTE — Telephone Encounter (Signed)
-----   Message from [name], MD sent at 03/15/2019  1:01 PM EST ----- A1c is 6.8 which is good but is elevated compared to 6.5 previously.  Please encourage to comply with his Metformin and a diabetic diet and weight loss.  Total cholesterol is normal but triglycerides which is a type of cholesterol is elevated compared to last set of labs.  Please advise him to cut down on fried foods and intake of omega-3 capsules will be beneficial.

## 2019-03-17 NOTE — Telephone Encounter (Signed)
Patient states that when he blows his nose clots of blood come out he is concerned.

## 2019-03-17 NOTE — Telephone Encounter (Signed)
Patient name and DOB has been verified Patient was informed of lab results. Patient had no questions.  

## 2019-03-17 NOTE — Progress Notes (Signed)
Virtual Visit via Telephone Note  I connected with Jaison Schelle on 03/17/19 at  3:50 PM EST by telephone and verified that I am speaking with the correct person using two identifiers.   I discussed the limitations, risks, security and privacy concerns of performing an evaluation and management service by telephone and the availability of in person appointments. I also discussed with the patient that there may be a patient responsible charge related to this service. The patient expressed understanding and agreed to proceed.  PATIENT visit by telephone virtually in the context of Covid-19 pandemic.  Patient location:  Home  My Location:  [place] office Persons on the call:  Me and the patient  History of Present Illness: Complaining of nose bleeds for the last week to 10 days.  No longer taking xarelto or any blood thinners.  Michela Pitcher he was told he does not need them.  He had knee replacement in December.    Nose bleeds occurring 2-3 times daily.  Last 10-15 mins and stop with pressure and tissue.  No BRBPR or melena.denies abdominal pain.  No bleeding gums.    Last BP in Epic 02/12/2019 122/91.  He quit smoking about 3 months ago.  Blood sugars running ~150.  A1C=6.8 3 days ago   Observations/Objective:  NAD.  A&Ox3   Assessment and Plan: 1. Epistaxis Hold nsaids/asa(not currently on any) - CBC with Differential/Platelet; Future - Ambulatory referral to ENT - Platelet function assay; Future -cold air humidifier and vaseline intranasally bid -to ED if unable to stop bleeding after 10 mins  2. Essential hypertension Continue current regimen-last BP in Epic 122/91 We have discussed target BP range and blood pressure goal. I have advised patient to check BP regularly and to call us back or report to clinic if the numbers are consistently higher than 140/90. We discussed the importance of compliance with medical therapy and DASH diet recommended, consequences of uncontrolled hypertension  discussed.     3. Type 2 diabetes mellitus without complication, without long-term current use of insulin (HCC) Continue current regimen Last BP in Epic 02/12/2019 122/91.  He quit smoking about 3 months ago.  Blood sugars running ~150.  A1C=6.8 3 days ago Diabetic diet adherence imperative   Follow Up Instructions: See PCP in 3 weeks   I discussed the assessment and treatment plan with the patient. The patient was provided an opportunity to ask questions and all were answered. The patient agreed with the plan and demonstrated an understanding of the instructions.   The patient was advised to call back or seek an in-person evaluation if the symptoms worsen or if the condition fails to improve as anticipated.  I provided 12 minutes of non-face-to-face time during this encounter.   Freeman Caldron, PA-C  Patient ID: Terry Poole, male   DOB: February 24, 1965, 54 y.o.   MRN: [id number]

## 2019-03-17 NOTE — Telephone Encounter (Signed)
I would recommend he go to Urgent Care for evaluation

## 2019-03-17 NOTE — Progress Notes (Signed)
Hospital f /u  Pt states" having a clot behind right knee " and from time to time experiencing pain

## 2019-03-18 ENCOUNTER — Ambulatory Visit: Payer: BLUE CROSS/BLUE SHIELD | Attending: Family Medicine

## 2019-03-18 ENCOUNTER — Other Ambulatory Visit: Payer: Self-pay

## 2019-03-18 DIAGNOSIS — R040 Epistaxis: Secondary | ICD-10-CM

## 2019-03-18 NOTE — Telephone Encounter (Signed)
Patient was called and informed to go to urgent care.

## 2019-03-19 LAB — CBC WITH DIFFERENTIAL/PLATELET
Basophils Absolute: 0 10*3/uL (ref 0.0–0.2)
Basos: 1 %
EOS (ABSOLUTE): 0.2 10*3/uL (ref 0.0–0.4)
Eos: 3 %
Hematocrit: 39.8 % (ref 37.5–51.0)
Hemoglobin: 13 g/dL (ref 13.0–17.7)
Immature Grans (Abs): 0 10*3/uL (ref 0.0–0.1)
Immature Granulocytes: 0 %
Lymphocytes Absolute: 3.6 10*3/uL — ABNORMAL HIGH (ref 0.7–3.1)
Lymphs: 54 %
MCH: 27.7 pg (ref 26.6–33.0)
MCHC: 32.7 g/dL (ref 31.5–35.7)
MCV: 85 fL (ref 79–97)
Monocytes Absolute: 0.3 10*3/uL (ref 0.1–0.9)
Monocytes: 5 %
Neutrophils Absolute: 2.4 10*3/uL (ref 1.4–7.0)
Neutrophils: 37 %
Platelets: 300 10*3/uL (ref 150–450)
RBC: 4.69 x10E6/uL (ref 4.14–5.80)
RDW: 14 % (ref 11.6–15.4)
WBC: 6.5 10*3/uL (ref 3.4–10.8)

## 2019-03-28 ENCOUNTER — Other Ambulatory Visit: Payer: Self-pay | Admitting: Physician Assistant

## 2019-03-28 MED FILL — SILDENAFIL CITRATE 50 MG TA: 50 | 30 days supply | Qty: 4 | Fill #2

## 2019-03-28 MED FILL — ALLOPURINOL 300 MG TAB: 300 | 30 days supply | Qty: 30 | Fill #0

## 2019-03-28 MED FILL — PANTOPRAZOLE SOD DR 40 MG T: 40 | 30 days supply | Qty: 60 | Fill #0

## 2019-03-28 MED FILL — LISINOPRIL 5 MG TABLET: 5 | 30 days supply | Qty: 30 | Fill #0

## 2019-03-29 ENCOUNTER — Encounter (HOSPITAL_COMMUNITY): Payer: Self-pay

## 2019-03-29 ENCOUNTER — Other Ambulatory Visit: Payer: Self-pay

## 2019-03-29 ENCOUNTER — Ambulatory Visit (HOSPITAL_COMMUNITY)
Admission: EM | Admit: 2019-03-29 | Discharge: 2019-03-29 | Disposition: A | Discharge status: Home / Self Care | Payer: BLUE CROSS/BLUE SHIELD

## 2019-03-29 DIAGNOSIS — R319 Hematuria, unspecified: Secondary | ICD-10-CM

## 2019-03-29 DIAGNOSIS — M51369 Other intervertebral disc degeneration, lumbar region without mention of lumbar back pain or lower extremity pain: Secondary | ICD-10-CM

## 2019-03-29 DIAGNOSIS — G8929 Other chronic pain: Secondary | ICD-10-CM | POA: Diagnosis not present

## 2019-03-29 DIAGNOSIS — I10 Essential (primary) hypertension: Secondary | ICD-10-CM

## 2019-03-29 DIAGNOSIS — M5136 Other intervertebral disc degeneration, lumbar region: Secondary | ICD-10-CM

## 2019-03-29 DIAGNOSIS — Z86718 Personal history of other venous thrombosis and embolism: Secondary | ICD-10-CM

## 2019-03-29 DIAGNOSIS — R040 Epistaxis: Secondary | ICD-10-CM

## 2019-03-29 DIAGNOSIS — M47816 Spondylosis without myelopathy or radiculopathy, lumbar region: Secondary | ICD-10-CM

## 2019-03-29 DIAGNOSIS — R030 Elevated blood-pressure reading, without diagnosis of hypertension: Secondary | ICD-10-CM

## 2019-03-29 DIAGNOSIS — M79604 Pain in right leg: Secondary | ICD-10-CM

## 2019-03-29 LAB — POCT URINALYSIS DIP (DEVICE)
Bilirubin Urine: NEGATIVE
Glucose, UA: NEGATIVE mg/dL
Hgb urine dipstick: NEGATIVE
Ketones, ur: NEGATIVE mg/dL
Leukocytes,Ua: NEGATIVE
Nitrite: NEGATIVE
Protein, ur: 30 mg/dL — AB
Specific Gravity, Urine: >=1.03 (ref 1.005–1.030)
Urobilinogen, UA: 0.2 mg/dL (ref 0.0–1.0)
pH: 5.5 (ref 5.0–8.0)

## 2019-03-29 MED ORDER — PREDNISONE 20 MG PO TABS: ORAL_TABLET | ORAL | 0 refills | Status: DC

## 2019-03-29 MED ORDER — TIZANIDINE HCL 6 MG PO CAPS: 6.0000 mg | ORAL_CAPSULE | Freq: Three times a day (TID) | ORAL | 0 refills | Status: DC

## 2019-03-29 NOTE — ED Triage Notes (Signed)
Pt is here with constant nosebleeds and blood in urine that started 3 weeks ago. Pt has not taken any meds to relieve discomfort.

## 2019-03-29 NOTE — Discharge Instructions (Addendum)
Please make sure you contact your PCP to see about having your prostate checked.  When you see blood in your urine that is 1 possibility that the blood is coming from a prostate issue.  Your blood pressure may also be an issue.  Make sure you continue to work with your PCP on managing this.  Avoid sodium/salt in your diet.  I am providing you with information to to different specialist clinics including an ear nose throat doctor and a back surgeon.  These places can help with your persistent nosebleeds and your chronic back pain.  In the meantime we are using a steroid course to help with your chronic back and knee pain.  Tizanidine is also the muscle relaxant will use for your back.  If you develop chest pain, difficulty with your breathing, confusion, weakness on one side of your body then please report to the emergency room as this may be a medical emergency involving your heart or a stroke.  Otherwise request another appointment with your regular doctor for recheck.

## 2019-03-29 NOTE — ED Provider Notes (Signed)
[place]   MRN: [id number] DOB: 1965/12/12  Subjective:   Terry Poole is a 54 y.o. male presenting for intermittent headaches, nosebleeds, back pain, right leg pain and hematuria.  Patient has a PCP, had a recent office visit with them and did blood work.  Kidney function was normal.  He is diabetic, has high blood pressure and is on medication for this.  He states that generally his blood pressure is okay unless he has flareup of his chronic pain.  Right now patient reports that his back is flaring up, pain is moderate to severe and radiates into his posterior thigh down to his knee.  He has a remote history of a severe back injury.  Imaging from 2020 showed advanced degenerative disc disease and spondylosis.  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  He is working with an orthopedist for arthritis of both of his knees.  Reports intermittent nosebleeds for the past 3 months.  Denies chest pain, heart racing, confusion, weakness.  Denies alcohol use.  Has a history of DVT, was on Xarelto and enoxaparin until last year.  He takes a baby aspirin now.  No current facility-administered medications for this encounter.  Current Outpatient Medications:    acetaminophen (TYLENOL) 500 MG tablet, Take by mouth., Disp: , Rfl:    allopurinol (ZYLOPRIM) 300 MG tablet, Take by mouth., Disp: , Rfl:    atorvastatin (LIPITOR) 40 MG tablet, Take by mouth., Disp: , Rfl:    iron polysaccharides (NIFEREX) 150 MG capsule, Take by mouth., Disp: , Rfl:    lisinopril (ZESTRIL) 5 MG tablet, Take by mouth., Disp: , Rfl:    metFORMIN (GLUCOPHAGE) 500 MG tablet, Take by mouth., Disp: , Rfl:    pantoprazole (PROTONIX) 40 MG tablet, Take by mouth., Disp: , Rfl:    acetaminophen (TYLENOL) 500 MG tablet, Take 2 tablets (1,000 mg total) by mouth every 8 (eight) hours as needed for moderate pain., Disp: 30 tablet, Rfl: 0   allopurinol (ZYLOPRIM) 300 MG tablet, Take 1 tablet (300 mg total) by mouth daily., Disp: 30  tablet, Rfl: 3   amoxicillin (AMOXIL) 500 MG capsule, Take 1 capsule (500 mg total) by mouth 3 (three) times daily. (Patient not taking: Reported on 03/17/2019), Disp: 30 capsule, Rfl: 0   atorvastatin (LIPITOR) 40 MG tablet, Take 1 tablet (40 mg total) by mouth daily., Disp: 30 tablet, Rfl: 3   colchicine 0.6 MG tablet, Take 1 tablet (0.6 mg) at the onset of a gout attack, may repeat 1 tablet in 2 hours if symptoms persist, Disp: 30 tablet, Rfl: 1   cyclobenzaprine (FLEXERIL) 10 MG tablet, Take 10 mg by mouth at bedtime., Disp: , Rfl:    cyclobenzaprine (FLEXERIL) 5 MG tablet, TAKE 1 TABLET (5 MG TOTAL) BY MOUTH 2 (TWO) TIMES DAILY AS NEEDED FOR MUSCLE SPASMS., Disp: 30 tablet, Rfl: 0   diclofenac sodium (VOLTAREN) 1 % GEL, Apply 2 g topically 4 (four) times daily. (Patient not taking: Reported on 03/17/2019), Disp: 100 g, Rfl: 0   enoxaparin (LOVENOX) 40 MG/0.4ML injection, , Disp: , Rfl:    fluticasone (FLONASE) 50 MCG/ACT nasal spray, Place 2 sprays into both nostrils daily as needed for allergies., Disp: 9.9 mL, Rfl: 2   furosemide (LASIX) 20 MG tablet, Take 1 tablet (20 mg total) by mouth daily. (Patient not taking: Reported on 03/17/2019), Disp: 5 tablet, Rfl: 0   lisinopril (ZESTRIL) 5 MG tablet, Take 1 tablet (5 mg total) by mouth daily., Disp: 30 tablet, Rfl:  3   metFORMIN (GLUCOPHAGE) 500 MG tablet, Take 1 tablet (500 mg total) by mouth daily with breakfast., Disp: 30 tablet, Rfl: 3   naproxen (NAPROSYN) 500 MG tablet, Take 1 tablet (500 mg total) by mouth 2 (two) times daily as needed for moderate pain (take with food)., Disp: 30 tablet, Rfl: 0   oxyCODONE (OXY IR/ROXICODONE) 5 MG immediate release tablet, Take 5 mg by mouth every 6 (six) hours as needed., Disp: , Rfl:    pantoprazole (PROTONIX) 40 MG tablet, TAKE 1 TABLET (40 MG TOTAL) BY MOUTH 2 (TWO) TIMES DAILY., Disp: 60 tablet, Rfl: 3   traMADol (ULTRAM) 50 MG tablet, Take 50 mg by mouth 3 (three) times daily as needed.,  Disp: , Rfl:    VIAGRA 50 MG tablet, TAKE 1 TABLET BY MOUTH DAILY AS NEEDED FOR ERECTILE DYSFUNCTION. AT LEAST 24 HOURS BETWEEN DOSES (Patient not taking: No sig reported), Disp: 10 tablet, Rfl: 2   XARELTO 20 MG TABS tablet, TAKE 1 TABLET BY MOUTH DAILY WITH SUPPER. (Patient not taking: No sig reported), Disp: 30 tablet, Rfl: 2   Allergies  Allergen Reactions   Vicodin [Hydrocodone-Acetaminophen] Hives    Patient stated he is no longer allergic to vicodin, patient stated that he was allergic about 12 years ago.     Past Medical History:  Diagnosis Date   Bronchitis    Bronchitis    DVT (deep venous thrombosis) (HCC)    Gout    Hammertoe of second toe of right foot    Smoker      Past Surgical History:  Procedure Laterality Date   HAMMER TOE SURGERY Right 03/10/2016   Procedure: 2ND RIGHT HAMMER TOE CORRECTION;  Surgeon: Landis Martins, DPM;  Location: [facility];  Service: Podiatry;  Laterality: Right;   MASS EXCISION Right 03/10/2016   Procedure: EXCISION OF CALLUS 2ND RIGHT TOE;  Surgeon: Landis Martins, DPM;  Location: [facility];  Service: Podiatry;  Laterality: Right;   NO PAST SURGERIES      Family History  Problem Relation Age of Onset   Diabetes Mother    Healthy Father    Heart disease Neg Hx    Kidney disease Neg Hx     Social History   Tobacco Use   Smoking status: Former Smoker    Packs/day: 0.25    Types: Cigarettes    Quit date: 01/25/2019    Years since quitting: 0.1   Smokeless tobacco: Never Used   Tobacco comment: 8 cigs daily  Substance Use Topics   Alcohol use: No   Drug use: No    ROS   Objective:   Vitals: BP (!) 162/102 (BP Location: Left Arm)    Pulse (!) 109    Temp 98.4 F (36.9 C) (Oral)    Resp 18    Wt 239 lb 9.6 oz (108.7 kg)    SpO2 96%    BMI 35.38 kg/m   BP Readings from Last 3 Encounters:  03/29/19 (!) 162/102  02/12/19 (!) 122/91  10/21/18 (!) 162/104   Recheck of BP  148/99 by PA-Johnda Billiot.   Physical Exam Constitutional:      General: He is not in acute distress.    Appearance: Normal appearance. He is well-developed. He is not ill-appearing, toxic-appearing or diaphoretic.  HENT:     Head: Normocephalic and atraumatic.     Right Ear: External ear normal.     Left Ear: External ear normal.     Nose:  Nose normal.     Mouth/Throat:     Mouth: Mucous membranes are moist.     Pharynx: Oropharynx is clear.  Eyes:     General: No scleral icterus.    Extraocular Movements: Extraocular movements intact.     Pupils: Pupils are equal, round, and reactive to light.  Cardiovascular:     Rate and Rhythm: Normal rate and regular rhythm.     Heart sounds: Normal heart sounds. No murmur. No friction rub. No gallop.   Pulmonary:     Effort: Pulmonary effort is normal. No respiratory distress.     Breath sounds: Normal breath sounds. No stridor. No wheezing, rhonchi or rales.  Musculoskeletal:     Lumbar back: Spasms and tenderness (low back, chronic not acute) present. No swelling, edema, deformity, signs of trauma, lacerations or bony tenderness. Decreased range of motion. Positive right straight leg raise test. Negative left straight leg raise test. No scoliosis.  Skin:    General: Skin is warm and dry.  Neurological:     Mental Status: He is alert and oriented to person, place, and time.     Cranial Nerves: No cranial nerve deficit.     Motor: No weakness.     Coordination: Coordination normal.     Gait: Gait normal.     Deep Tendon Reflexes: Reflexes normal.  Psychiatric:        Mood and Affect: Mood normal.        Behavior: Behavior normal.        Thought Content: Thought content normal.        Judgment: Judgment normal.     Results for orders placed or performed during the hospital encounter of 03/29/19 (from the past 24 hour(s))  POCT urinalysis dip (device)     Status: Abnormal   Collection Time: 03/29/19  2:12 PM  Result Value Ref Range    Glucose, UA NEGATIVE NEGATIVE mg/dL   Bilirubin Urine NEGATIVE NEGATIVE   Ketones, ur NEGATIVE NEGATIVE mg/dL   Specific Gravity, Urine >=1.030 1.005 - 1.030   Hgb urine dipstick NEGATIVE NEGATIVE   pH 5.5 5.0 - 8.0   Protein, ur 30 (A) NEGATIVE mg/dL   Urobilinogen, UA 0.2 0.0 - 1.0 mg/dL   Nitrite NEGATIVE NEGATIVE   Leukocytes,Ua NEGATIVE NEGATIVE    Assessment and Plan :   1. Other chronic pain   2. Hematuria, unspecified type   3. Epistaxis   4. Essential hypertension   5. Elevated blood pressure reading   6. Right leg pain   7. History of blood clots   8. Spondylosis of lumbar spine   9. Degenerative disc disease, lumbar     I help patient honing on the fact that his chronic pain is flaring up and may be a source of his current malaise.  He was in agreement and would like to focus on his back pain.  We will use of prednisone course for this and a muscle relaxant.  His blood pressure has fluctuated based on the past three readings, recommended close follow-up with his PCP.  He is to follow-up with [facility] and [place] [facility] regarding his nosebleeds and chronic back pain.  Information provided to the patient.  Counseled patient on need for follow-up with his PCP for recheck of his blood pressure, further work-up of hematuria (not seen today on urinalysis).  I declined to provide patient with prescription for narcotic as he requested.  He was agreeable to this. Counseled patient on potential for  adverse effects with medications prescribed/recommended today, strict ER and return-to-clinic precautions discussed, patient verbalized understanding.    Terry Eagles, PA-C 03/29/19 1431

## 2019-03-30 ENCOUNTER — Telehealth (HOSPITAL_COMMUNITY): Payer: Self-pay | Admitting: Emergency Medicine

## 2019-03-30 MED ORDER — TIZANIDINE HCL 4 MG PO TABS: 4.0000 mg | ORAL_TABLET | Freq: Three times a day (TID) | ORAL | 0 refills | Status: DC | PRN

## 2019-03-30 MED FILL — tiZANidine HCL 4 MG TABS: 4 | 30 days supply | Qty: 90 | Fill #0

## 2019-03-30 MED FILL — predniSONE 20 MG TABS: 20 | 5 days supply | Qty: 10 | Fill #0

## 2019-03-30 NOTE — Telephone Encounter (Signed)
6 mg tizanidine not covered, switching to 4 mg tablets

## 2019-04-14 ENCOUNTER — Other Ambulatory Visit: Payer: Self-pay

## 2019-04-14 ENCOUNTER — Ambulatory Visit: Payer: BLUE CROSS/BLUE SHIELD | Attending: Family Medicine | Admitting: Family Medicine

## 2019-04-14 ENCOUNTER — Encounter: Payer: Self-pay | Admitting: Family Medicine

## 2019-04-14 DIAGNOSIS — M25561 Pain in right knee: Secondary | ICD-10-CM

## 2019-04-14 DIAGNOSIS — G8929 Other chronic pain: Secondary | ICD-10-CM

## 2019-04-14 DIAGNOSIS — R040 Epistaxis: Secondary | ICD-10-CM

## 2019-04-14 DIAGNOSIS — I2699 Other pulmonary embolism without acute cor pulmonale: Secondary | ICD-10-CM

## 2019-04-14 DIAGNOSIS — I82401 Acute embolism and thrombosis of unspecified deep veins of right lower extremity: Secondary | ICD-10-CM | POA: Diagnosis not present

## 2019-04-14 DIAGNOSIS — Z96651 Presence of right artificial knee joint: Secondary | ICD-10-CM | POA: Diagnosis not present

## 2019-04-14 MED ORDER — IBUPROFEN 800 MG PO TABS: 800.0000 mg | ORAL_TABLET | Freq: Two times a day (BID) | ORAL | 1 refills | Status: DC | PRN

## 2019-04-14 MED FILL — IBUPROFEN 800 MG TABLET: 800 | 30 days supply | Qty: 60 | Fill #0

## 2019-04-14 NOTE — Progress Notes (Signed)
Virtual Visit via Telephone Note  I connected with Terry Poole, on 04/14/2019 at 8:38 AM by telephone due to the COVID-19 pandemic and verified that I am speaking with the correct person using two identifiers.   Consent: I discussed the limitations, risks, security and privacy concerns of performing an evaluation and management service by telephone and the availability of in person appointments. I also discussed with the patient that there may be a patient responsible charge related to this service. The patient expressed understanding and agreed to proceed.   Location of Patient: Home  Location of Provider: Clinic   Persons participating in Telemedicine visit: Terry Poole-CMA Dr. Margarita Rana     History of Present Illness: Terry Poole a 54 year old male with a history of type 2 diabetes mellitus (A1c 6.5) Hyperlipidemia, hypertension, gout, bilateral PE and right lower extremity DVT (diagnosed in 09/2017), s/p R TKR who presents today for a follow-up visit. Requests Ibuprofen as he so he can alternate with Tylenol He has been having intermittent R knee swelling since his surgery also has R knee pain which responds to the use of Tylenol. Entire RLE is swollen and tender; pain is 8/10.  'He was taken off Xarelto by ENT' due to nose bleeds which he had for about 3 weeks and he is now wondering if he has a blood clot. States he also had rectal bleeding; ENT notes reviewed and no recommendation to discontinue Xarelto but use nasal sprays, referral to General Surgery placed.  Past Medical History:  Diagnosis Date  . Bronchitis   . Bronchitis   . DVT (deep venous thrombosis) ([place])   . Gout   . Hammertoe of second toe of right foot   . Smoker    Allergies  Allergen Reactions  . Vicodin [Hydrocodone-Acetaminophen] Hives    Patient stated he is no longer allergic to vicodin, patient stated that he was allergic about 12 years ago.     Current Outpatient Medications  on File Prior to Visit  Medication Sig Dispense Refill  . acetaminophen (TYLENOL) 500 MG tablet Take 2 tablets (1,000 mg total) by mouth every 8 (eight) hours as needed for moderate pain. 30 tablet 0  . acetaminophen (TYLENOL) 500 MG tablet Take by mouth.    Marland Kitchen allopurinol (ZYLOPRIM) 300 MG tablet Take 1 tablet (300 mg total) by mouth daily. 30 tablet 3  . allopurinol (ZYLOPRIM) 300 MG tablet Take by mouth.    Marland Kitchen atorvastatin (LIPITOR) 40 MG tablet Take 1 tablet (40 mg total) by mouth daily. 30 tablet 3  . atorvastatin (LIPITOR) 40 MG tablet Take by mouth.    . colchicine 0.6 MG tablet Take 1 tablet (0.6 mg) at the onset of a gout attack, may repeat 1 tablet in 2 hours if symptoms persist 30 tablet 1  . cyclobenzaprine (FLEXERIL) 10 MG tablet Take 10 mg by mouth at bedtime.    . cyclobenzaprine (FLEXERIL) 5 MG tablet TAKE 1 TABLET (5 MG TOTAL) BY MOUTH 2 (TWO) TIMES DAILY AS NEEDED FOR MUSCLE SPASMS. 30 tablet 0  . enoxaparin (LOVENOX) 40 MG/0.4ML injection     . fluticasone (FLONASE) 50 MCG/ACT nasal spray Place 2 sprays into both nostrils daily as needed for allergies. 9.9 mL 2  . furosemide (LASIX) 20 MG tablet Take 1 tablet (20 mg total) by mouth daily. 5 tablet 0  . iron polysaccharides (NIFEREX) 150 MG capsule Take by mouth.    Marland Kitchen lisinopril (ZESTRIL) 5 MG tablet Take 1 tablet (5 mg total)  by mouth daily. 30 tablet 3  . lisinopril (ZESTRIL) 5 MG tablet Take by mouth.    . metFORMIN (GLUCOPHAGE) 500 MG tablet Take 1 tablet (500 mg total) by mouth daily with breakfast. 30 tablet 3  . metFORMIN (GLUCOPHAGE) 500 MG tablet Take by mouth.    . naproxen (NAPROSYN) 500 MG tablet Take 1 tablet (500 mg total) by mouth 2 (two) times daily as needed for moderate pain (take with food). 30 tablet 0  . pantoprazole (PROTONIX) 40 MG tablet TAKE 1 TABLET (40 MG TOTAL) BY MOUTH 2 (TWO) TIMES DAILY. 60 tablet 3  . pantoprazole (PROTONIX) 40 MG tablet Take by mouth.    Marland Kitchen tiZANidine (ZANAFLEX) 4 MG tablet Take 1  tablet (4 mg total) by mouth every 8 (eight) hours as needed for muscle spasms. 90 tablet 0  . traMADol (ULTRAM) 50 MG tablet Take 50 mg by mouth 3 (three) times daily as needed.    Marland Kitchen amoxicillin (AMOXIL) 500 MG capsule Take 1 capsule (500 mg total) by mouth 3 (three) times daily. (Patient not taking: Reported on 03/17/2019) 30 capsule 0  . diclofenac sodium (VOLTAREN) 1 % GEL Apply 2 g topically 4 (four) times daily. (Patient not taking: Reported on 03/17/2019) 100 g 0  . oxyCODONE (OXY IR/ROXICODONE) 5 MG immediate release tablet Take 5 mg by mouth every 6 (six) hours as needed.    . predniSONE (DELTASONE) 20 MG tablet Take 2 tablets daily with breakfast. (Patient not taking: Reported on 04/14/2019) 10 tablet 0  . VIAGRA 50 MG tablet TAKE 1 TABLET BY MOUTH DAILY AS NEEDED FOR ERECTILE DYSFUNCTION. AT LEAST 24 HOURS BETWEEN DOSES (Patient not taking: No sig reported) 10 tablet 2  . XARELTO 20 MG TABS tablet TAKE 1 TABLET BY MOUTH DAILY WITH SUPPER. (Patient not taking: No sig reported) 30 tablet 2   No current facility-administered medications on file prior to visit.    Observations/Objective: Awake, alert, oriented x3 Not in acute distress  Lab Results  Component Value Date   HGBA1C 6.8 (H) 03/14/2019    Assessment and Plan: 1. Total knee replacement status, right Ongoing knee pain I have prescribed a short course of Ibuprofen given he is not on anticoagulant at the moment Should he restart his Xarelto he will need to discontinue Ibuprofen due to increased risk of bleeding - ibuprofen (ADVIL) 800 MG tablet; Take 1 tablet (800 mg total) by mouth every 12 (twelve) hours as needed.  Dispense: 60 tablet; Refill: 1  2. Epistaxis None at this time ENT recommendations include use of nasal spray - Ambulatory referral to Hematology  3. Deep vein thrombosis (DVT) of right lower extremity, unspecified chronicity, unspecified vein (HCC) Currently off Xarelto I am unable to identify a  documentation where he was advised to discontinue Xarelto Discussed risks and benefits of anticoagulation - his bleeding risk seems to be low. Will refer to hematology - VAS [place] LOWER EXTREMITY VENOUS (DVT); Future - Ambulatory referral to Hematology  4. Pulmonary embolism, other, unspecified chronicity, unspecified whether acute cor pulmonale present [facility]) See #3 above - Ambulatory referral to Hematology   Follow Up Instructions: Keep previously scheduled appointment   I discussed the assessment and treatment plan with the patient. The patient was provided an opportunity to ask questions and all were answered. The patient agreed with the plan and demonstrated an understanding of the instructions.   The patient was advised to call back or seek an in-person evaluation if the symptoms worsen or if  the condition fails to improve as anticipated.     I provided 12 minutes total of non-face-to-face time during this encounter including median intraservice time, reviewing previous notes, investigations, ordering medications, medical decision making, coordinating care and patient verbalized understanding at the end of the visit.     [name], MD, FAAFP. [facility] [place], [place]   04/14/2019, 8:38 AM

## 2019-04-14 NOTE — Progress Notes (Signed)
Patient has been called and DOB has been verified. Patient has been screened and transferred to PCP to start phone visit.   Patient states that he wants to be checked again to make sure that he does not have any clots.  Feet and legs are swelling.

## 2019-04-15 ENCOUNTER — Telehealth: Payer: Self-pay | Admitting: Oncology

## 2019-04-15 NOTE — Telephone Encounter (Signed)
Scheduled per referral. Called and spoke with pt, confirmed 3/19 appt. Pt aware to arrive 15-30 mins early and to bring insurance card with photo ID

## 2019-04-16 ENCOUNTER — Emergency Department (HOSPITAL_COMMUNITY)
Admission: EM | Admit: 2019-04-16 | Discharge: 2019-04-16 | Disposition: A | Discharge status: Home / Self Care | Payer: BLUE CROSS/BLUE SHIELD | Attending: Emergency Medicine | Admitting: Emergency Medicine

## 2019-04-16 ENCOUNTER — Other Ambulatory Visit: Payer: Self-pay

## 2019-04-16 ENCOUNTER — Emergency Department (HOSPITAL_COMMUNITY): Payer: BLUE CROSS/BLUE SHIELD

## 2019-04-16 ENCOUNTER — Encounter (HOSPITAL_COMMUNITY): Payer: Self-pay

## 2019-04-16 DIAGNOSIS — Z86718 Personal history of other venous thrombosis and embolism: Secondary | ICD-10-CM | POA: Diagnosis not present

## 2019-04-16 DIAGNOSIS — Z7984 Long term (current) use of oral hypoglycemic drugs: Secondary | ICD-10-CM | POA: Diagnosis not present

## 2019-04-16 DIAGNOSIS — R0789 Other chest pain: Secondary | ICD-10-CM | POA: Diagnosis present

## 2019-04-16 DIAGNOSIS — M5441 Lumbago with sciatica, right side: Secondary | ICD-10-CM | POA: Insufficient documentation

## 2019-04-16 DIAGNOSIS — I10 Essential (primary) hypertension: Secondary | ICD-10-CM | POA: Insufficient documentation

## 2019-04-16 DIAGNOSIS — R0602 Shortness of breath: Secondary | ICD-10-CM | POA: Diagnosis not present

## 2019-04-16 DIAGNOSIS — Z86711 Personal history of pulmonary embolism: Secondary | ICD-10-CM | POA: Diagnosis not present

## 2019-04-16 DIAGNOSIS — R519 Headache, unspecified: Secondary | ICD-10-CM | POA: Insufficient documentation

## 2019-04-16 DIAGNOSIS — E119 Type 2 diabetes mellitus without complications: Secondary | ICD-10-CM | POA: Diagnosis not present

## 2019-04-16 DIAGNOSIS — I82511 Chronic embolism and thrombosis of right femoral vein: Secondary | ICD-10-CM

## 2019-04-16 DIAGNOSIS — G8929 Other chronic pain: Secondary | ICD-10-CM

## 2019-04-16 DIAGNOSIS — Z79899 Other long term (current) drug therapy: Secondary | ICD-10-CM | POA: Insufficient documentation

## 2019-04-16 LAB — CBC
HCT: 42 % (ref 39.0–52.0)
Hemoglobin: 12.9 g/dL — ABNORMAL LOW (ref 13.0–17.0)
MCH: 27.3 pg (ref 26.0–34.0)
MCHC: 30.7 g/dL (ref 30.0–36.0)
MCV: 88.8 fL (ref 80.0–100.0)
Platelets: 272 10*3/uL (ref 150–400)
RBC: 4.73 MIL/uL (ref 4.22–5.81)
RDW: 15.6 % — ABNORMAL HIGH (ref 11.5–15.5)
WBC: 7 10*3/uL (ref 4.0–10.5)
nRBC: 0 % (ref 0.0–0.2)

## 2019-04-16 LAB — COMPREHENSIVE METABOLIC PANEL
ALT: 61 U/L — ABNORMAL HIGH (ref 0–44)
AST: 32 U/L (ref 15–41)
Albumin: 4 g/dL (ref 3.5–5.0)
Alkaline Phosphatase: 100 U/L (ref 38–126)
Anion gap: 13 (ref 5–15)
BUN: 15 mg/dL (ref 6–20)
CO2: 19 mmol/L — ABNORMAL LOW (ref 22–32)
Calcium: 9 mg/dL (ref 8.9–10.3)
Chloride: 109 mmol/L (ref 98–111)
Creatinine, Ser: 1.2 mg/dL (ref 0.61–1.24)
GFR calc Af Amer: >60 mL/min (ref >60)
GFR calc non Af Amer: >60 mL/min (ref >60)
Glucose, Bld: 186 mg/dL — ABNORMAL HIGH (ref 70–99)
Potassium: 4.4 mmol/L (ref 3.5–5.1)
Sodium: 141 mmol/L (ref 135–145)
Total Bilirubin: 0.5 mg/dL (ref 0.3–1.2)
Total Protein: 7.1 g/dL (ref 6.5–8.1)

## 2019-04-16 LAB — URINALYSIS, ROUTINE W REFLEX MICROSCOPIC
Bilirubin Urine: NEGATIVE
Glucose, UA: NEGATIVE mg/dL
Hgb urine dipstick: NEGATIVE
Ketones, ur: NEGATIVE mg/dL
Leukocytes,Ua: NEGATIVE
Nitrite: NEGATIVE
Protein, ur: NEGATIVE mg/dL
Specific Gravity, Urine: 1.013 (ref 1.005–1.030)
pH: 5 (ref 5.0–8.0)

## 2019-04-16 LAB — TROPONIN I (HIGH SENSITIVITY)
Troponin I (High Sensitivity): 9 ng/L (ref <18)
Troponin I (High Sensitivity): 5 ng/L (ref <18)

## 2019-04-16 LAB — LIPASE, BLOOD: Lipase: 17 U/L (ref 11–51)

## 2019-04-16 MED ORDER — DICLOFENAC SODIUM 75 MG PO TBEC: 75.0000 mg | DELAYED_RELEASE_TABLET | Freq: Two times a day (BID) | ORAL | 0 refills | Status: DC

## 2019-04-16 MED ORDER — IOHEXOL 350 MG/ML SOLN
100.0000 mL | Freq: Once | INTRAVENOUS | Status: AC | PRN
  Administered 2019-04-16: 100 mL via INTRAVENOUS

## 2019-04-16 MED ORDER — SODIUM CHLORIDE 0.9% FLUSH: 3.0000 mL | Freq: Once | INTRAVENOUS | Status: DC

## 2019-04-16 MED ORDER — METHOCARBAMOL 500 MG PO TABS: 500.0000 mg | ORAL_TABLET | Freq: Two times a day (BID) | ORAL | 0 refills | Status: DC

## 2019-04-16 NOTE — ED Provider Notes (Signed)
[name] Provider Note   CSN: [id number] Arrival date & time: 04/16/19  1337     History No chief complaint on file.   Terry Poole is a 54 y.o. male.  Patient reports multiple complaints.  He is concerned that he could have a blood clot in his lungs.  He states he stopped taking Xarelto a couple of weeks ago and that he experienced some discomfort in his chest today and was worried that he could have a blood clot.  Patient states he would also like evaluation for headaches.  He had a head injury is used and reports that he has had headaches on and off since that time.  Reports he injured his back 20 years ago and did not get it checked he is now concerned that there is something wrong with his back.  Patient reports he stopped taking Xarelto because he was having nosebleeds.  Patient denies any nausea or vomiting he is not photophobic denies any weakness in any extremities.  No difficulty with vision or hearing.  He reports he has problems with both knees he has had a knee replacement to his right knee and he is waiting for it to heal until he has a knee replacement for his left knee.   The history is provided by the patient. No language interpreter was used.       Past Medical History:  Diagnosis Date  . Bronchitis   . Bronchitis   . DVT (deep venous thrombosis) ([place])   . Gout   . Hammertoe of second toe of right foot   . Smoker     Patient Active Problem List   Diagnosis Date Noted  . Total knee replacement status, right 04/14/2019  . Osteoarthritis of right knee 12/10/2017  . DVT (deep venous thrombosis) ([place]) 10/13/2017  . Pulmonary embolism ([place]) 10/02/2017  . AKI (acute kidney injury) ([place]) 09/22/2017  . Chest pain 09/22/2017  . Hypertension 11/19/2016  . Type 2 diabetes mellitus ([place]) 08/19/2016  . Hyperlipidemia 08/19/2016  . Back pain 05/13/2016  . Hammertoe of second toe of right foot 03/06/2016  . Onychomycosis  04/16/2015  . Pseudofolliculitis barbae [id number]  . Gout 03/28/2015    Past Surgical History:  Procedure Laterality Date  . HAMMER TOE SURGERY Right 03/10/2016   Procedure: 2ND RIGHT HAMMER TOE CORRECTION;  Surgeon: Landis Martins, DPM;  Location: [facility];  Service: Podiatry;  Laterality: Right;  . MASS EXCISION Right 03/10/2016   Procedure: EXCISION OF CALLUS 2ND RIGHT TOE;  Surgeon: Landis Martins, DPM;  Location: [facility];  Service: Podiatry;  Laterality: Right;  . NO PAST SURGERIES         Family History  Problem Relation Age of Onset  . Diabetes Mother   . Healthy Father   . Heart disease Neg Hx   . Kidney disease Neg Hx     Social History   Tobacco Use  . Smoking status: Former Smoker    Packs/day: 0.25    Types: Cigarettes    Quit date: 01/25/2019    Years since quitting: 0.2  . Smokeless tobacco: Never Used  . Tobacco comment: 8 cigs daily  Substance Use Topics  . Alcohol use: No  . Drug use: No    Home Medications Prior to Admission medications   Medication Sig Start Date End Date Taking? Authorizing Provider  acetaminophen (TYLENOL) 500 MG tablet Take 2 tablets (1,000 mg total) by mouth every 8 (eight)  hours as needed for moderate pain. 03/23/18   Law, Bea Graff, PA-C  acetaminophen (TYLENOL) 500 MG tablet Take by mouth. 03/23/18   [provider]  allopurinol (ZYLOPRIM) 300 MG tablet Take 1 tablet (300 mg total) by mouth daily. 02/14/19   [name], MD  allopurinol (ZYLOPRIM) 300 MG tablet Take by mouth. 06/30/18   [provider]  amoxicillin (AMOXIL) 500 MG capsule Take 1 capsule (500 mg total) by mouth 3 (three) times daily. Patient not taking: Reported on 03/17/2019 02/14/19   [name], MD  atorvastatin (LIPITOR) 40 MG tablet Take 1 tablet (40 mg total) by mouth daily. 02/14/19   [name], MD  atorvastatin (LIPITOR) 40 MG tablet Take by mouth. 07/27/18   [provider]    colchicine 0.6 MG tablet Take 1 tablet (0.6 mg) at the onset of a gout attack, may repeat 1 tablet in 2 hours if symptoms persist 07/27/18   [name], MD  cyclobenzaprine (FLEXERIL) 10 MG tablet Take 10 mg by mouth at bedtime. 01/07/19   [provider]  cyclobenzaprine (FLEXERIL) 5 MG tablet TAKE 1 TABLET (5 MG TOTAL) BY MOUTH 2 (TWO) TIMES DAILY AS NEEDED FOR MUSCLE SPASMS. 02/03/19   [name], MD  diclofenac sodium (VOLTAREN) 1 % GEL Apply 2 g topically 4 (four) times daily. Patient not taking: Reported on 03/17/2019 10/21/18   Caccavale, Sophia, PA-C  enoxaparin (LOVENOX) 40 MG/0.4ML injection  01/07/19   [provider]  fluticasone (FLONASE) 50 MCG/ACT nasal spray Place 2 sprays into both nostrils daily as needed for allergies. 01/13/19   Ladell Pier, MD  furosemide (LASIX) 20 MG tablet Take 1 tablet (20 mg total) by mouth daily. 07/27/18   [name], MD  ibuprofen (ADVIL) 800 MG tablet Take 1 tablet (800 mg total) by mouth every 12 (twelve) hours as needed. 04/14/19   [name], MD  iron polysaccharides (NIFEREX) 150 MG capsule Take by mouth. 01/08/19   [provider]  lisinopril (ZESTRIL) 5 MG tablet Take 1 tablet (5 mg total) by mouth daily. 02/14/19   [name], MD  lisinopril (ZESTRIL) 5 MG tablet Take by mouth. 07/27/18   [provider]  metFORMIN (GLUCOPHAGE) 500 MG tablet Take 1 tablet (500 mg total) by mouth daily with breakfast. 02/14/19   [name], MD  metFORMIN (GLUCOPHAGE) 500 MG tablet Take by mouth. 07/27/18   [provider]  naproxen (NAPROSYN) 500 MG tablet Take 1 tablet (500 mg total) by mouth 2 (two) times daily as needed for moderate pain (take with food). 01/13/19   Ladell Pier, MD  oxyCODONE (OXY IR/ROXICODONE) 5 MG immediate release tablet Take 5 mg by mouth every 6 (six) hours as needed. 01/25/19   [provider]  pantoprazole (PROTONIX) 40 MG tablet TAKE 1 TABLET (40 MG  TOTAL) BY MOUTH 2 (TWO) TIMES DAILY. 03/28/19   [name], MD  pantoprazole (PROTONIX) 40 MG tablet Take by mouth. 03/16/18   [provider]  predniSONE (DELTASONE) 20 MG tablet Take 2 tablets daily with breakfast. Patient not taking: Reported on 04/14/2019 03/29/19   Jaynee Eagles, PA-C  tiZANidine (ZANAFLEX) 4 MG tablet Take 1 tablet (4 mg total) by mouth every 8 (eight) hours as needed for muscle spasms. 03/30/19   Wieters, Hallie C, PA-C  traMADol (ULTRAM) 50 MG tablet Take 50 mg by mouth 3 (three) times daily as needed. 12/13/18   [provider]  VIAGRA 50 MG tablet TAKE 1 TABLET BY  MOUTH DAILY AS NEEDED FOR ERECTILE DYSFUNCTION. AT LEAST 24 HOURS BETWEEN DOSES Patient not taking: No sig reported 08/05/18   [name], MD  XARELTO 20 MG TABS tablet TAKE 1 TABLET BY MOUTH DAILY WITH SUPPER. Patient not taking: No sig reported 08/13/18   [name], MD    Allergies    Vicodin [hydrocodone-acetaminophen]  Review of Systems   Review of Systems  All other systems reviewed and are negative.   Physical Exam Updated Vital Signs BP (!) 155/94   Pulse 75   Temp 98.3 F (36.8 C) (Oral)   Resp (!) 23   Ht 5\' 9"  (1.753 m)   Wt 106.6 kg   SpO2 100%   BMI 34.70 kg/m   Physical Exam Vitals and nursing note reviewed.  Constitutional:      Appearance: He is well-developed.  HENT:     Head: Normocephalic and atraumatic.     Nose: Nose normal.     Mouth/Throat:     Mouth: Mucous membranes are moist.  Eyes:     Conjunctiva/sclera: Conjunctivae normal.  Cardiovascular:     Rate and Rhythm: Normal rate and regular rhythm.     Heart sounds: No murmur.  Pulmonary:     Effort: Pulmonary effort is normal. No respiratory distress.     Breath sounds: Normal breath sounds.  Abdominal:     General: Abdomen is flat.     Palpations: Abdomen is soft.     Tenderness: There is no abdominal tenderness.  Musculoskeletal:        General: Normal range of motion.      Cervical back: Neck supple.  Skin:    General: Skin is warm and dry.  Neurological:     Mental Status: He is alert.  Psychiatric:        Mood and Affect: Mood normal.     ED Results / Procedures / Treatments   Labs (all labs ordered are listed, but only abnormal results are displayed) Labs Reviewed  COMPREHENSIVE METABOLIC PANEL - Abnormal; Notable for the following components:      Result Value   CO2 19 (*)    Glucose, Bld 186 (*)    ALT 61 (*)    All other components within normal limits  CBC - Abnormal; Notable for the following components:   Hemoglobin 12.9 (*)    RDW 15.6 (*)    All other components within normal limits  URINALYSIS, ROUTINE W REFLEX MICROSCOPIC - Abnormal; Notable for the following components:   Color, Urine STRAW (*)    All other components within normal limits  LIPASE, BLOOD  TROPONIN I (HIGH SENSITIVITY)  TROPONIN I (HIGH SENSITIVITY)    EKG None  Radiology CT Angio Chest PE W and/or Wo Contrast  Result Date: 04/16/2019 CLINICAL DATA:  Back pain EXAM: CT ANGIOGRAPHY CHEST WITH CONTRAST TECHNIQUE: Multidetector CT imaging of the chest was performed using the standard protocol during bolus administration of intravenous contrast. Multiplanar CT image reconstructions and MIPs were obtained to evaluate the vascular anatomy. CONTRAST:  118mL OMNIPAQUE IOHEXOL 350 MG/ML SOLN COMPARISON:  10/21/2018 FINDINGS: Cardiovascular: Satisfactory opacification of the pulmonary arteries to the segmental level. No evidence of pulmonary embolism. Normal heart size. No pericardial effusion. Mediastinum/Nodes: No enlarged mediastinal, hilar, or axillary lymph nodes. Thyroid gland, trachea, and esophagus demonstrate no significant findings. Lungs/Pleura: Lungs are clear. No pleural effusion or pneumothorax. Upper Abdomen: No acute abnormality. Musculoskeletal: No chest wall abnormality. No acute or significant osseous findings. Review of the  MIP images confirms the above  findings. IMPRESSION: Negative examination for pulmonary embolism. Electronically Signed   By: Eddie Candle M.D.   On: 04/16/2019 18:03    Procedures Procedures (including critical care time)  Medications Ordered in ED Medications  sodium chloride flush (NS) 0.9 % injection 3 mL (3 mLs Intravenous Not Given 04/16/19 1536)  iohexol (OMNIPAQUE) 350 MG/ML injection 100 mL (100 mLs Intravenous Contrast Given 04/16/19 1754)    ED Course  I have reviewed the triage vital signs and the nursing notes.  Pertinent labs & imaging results that were available during my care of the patient were reviewed by me and considered in my medical decision making (see chart for details).    MDM Rules/Calculators/A&P                      MDM: Patient's back pain by history is chronic headaches are chronic.  I advised patient that he should follow-up with his primary care physician for these concerns.  Patient's EKG is normal.  laboratory evaluation showed a glucose of 186.  CT scan of patient's chest was obtained due to concern of possible pulmonary embolus CT scan per radiologist shows no evidence of PE.  Patient is scheduled to see oncology on 3/19 for evaluation.  I think patient should continue anticoagulation given his history.  I advised him to keep appointment with oncology.   Ffinal Clinical Impression(s) / ED Diagnoses Final diagnoses:  Acute nonintractable headache, unspecified headache type  Shortness of breath  Chronic low back pain with right-sided sciatica, unspecified back pain laterality    Rx / DC Orders ED Discharge Orders    None    An After Visit Summary was printed and given to the patient.    Fransico Meadow, [place] 04/16/19 1928    Maudie Flakes, MD 04/17/19 [phone]

## 2019-04-16 NOTE — ED Triage Notes (Signed)
Patient complains of 1 day of lower back, chest, head and abdominal pain. No nausea, no vomiting. Patient alert and oriented, NAD. States that the back pain radiating down right leg.

## 2019-04-16 NOTE — Discharge Instructions (Signed)
Follow up with your Physician for recheck,

## 2019-04-18 MED FILL — DICLOFENAC SOD EC 75 MG TAB: 75 | 10 days supply | Qty: 20 | Fill #0

## 2019-04-18 MED FILL — METHOCARBAMOL 500 MG TABS: 500 | 10 days supply | Qty: 20 | Fill #0

## 2019-04-20 ENCOUNTER — Ambulatory Visit (HOSPITAL_COMMUNITY)
Admission: RE | Admit: 2019-04-20 | Discharge: 2019-04-20 | Disposition: A | Discharge status: Home / Self Care | Payer: BLUE CROSS/BLUE SHIELD | Source: Ambulatory Visit | Attending: Family Medicine | Admitting: Family Medicine

## 2019-04-20 ENCOUNTER — Other Ambulatory Visit: Payer: Self-pay

## 2019-04-20 ENCOUNTER — Telehealth: Payer: Self-pay

## 2019-04-20 DIAGNOSIS — I82401 Acute embolism and thrombosis of unspecified deep veins of right lower extremity: Secondary | ICD-10-CM | POA: Diagnosis not present

## 2019-04-20 DIAGNOSIS — I2699 Other pulmonary embolism without acute cor pulmonale: Secondary | ICD-10-CM

## 2019-04-20 NOTE — Telephone Encounter (Signed)
Contacted pt to go over DVT results pt is aware. Pt is requesting a new rx for Xarelto and would like for it to be sent to [facility]

## 2019-04-21 ENCOUNTER — Telehealth: Payer: Self-pay | Admitting: Family Medicine

## 2019-04-21 MED ORDER — RIVAROXABAN 20 MG PO TABS: ORAL_TABLET | ORAL | 2 refills | Status: DC

## 2019-04-21 MED FILL — XARELTO 20 MG TABLET: 20 | 30 days supply | Qty: 30 | Fill #0

## 2019-04-21 NOTE — Telephone Encounter (Signed)
Patient would like PCP to advise him if it is ok to get the COVID shot. Please call patient

## 2019-04-21 NOTE — Telephone Encounter (Signed)
Contacted pt to go over Dr. Margarita Rana respponse pt is aware and doesn't have any questuons or concerns

## 2019-04-21 NOTE — Telephone Encounter (Signed)
Done. Please advise to discontinue ibuprofen and other NSAIDs as this can increase risk of bleeding when taken along with Xarelto.

## 2019-04-22 ENCOUNTER — Other Ambulatory Visit: Payer: Self-pay

## 2019-04-22 ENCOUNTER — Inpatient Hospital Stay: Payer: BLUE CROSS/BLUE SHIELD | Attending: Oncology | Admitting: Oncology

## 2019-04-22 VITALS — BP 148/96 | HR 87 | Temp 97.8°F | Resp 18 | Ht 69.0 in | Wt 242.9 lb

## 2019-04-22 DIAGNOSIS — Z86718 Personal history of other venous thrombosis and embolism: Secondary | ICD-10-CM | POA: Insufficient documentation

## 2019-04-22 DIAGNOSIS — I82401 Acute embolism and thrombosis of unspecified deep veins of right lower extremity: Secondary | ICD-10-CM | POA: Diagnosis not present

## 2019-04-22 DIAGNOSIS — Z87891 Personal history of nicotine dependence: Secondary | ICD-10-CM | POA: Diagnosis not present

## 2019-04-22 DIAGNOSIS — Z79899 Other long term (current) drug therapy: Secondary | ICD-10-CM | POA: Diagnosis not present

## 2019-04-22 DIAGNOSIS — Z7901 Long term (current) use of anticoagulants: Secondary | ICD-10-CM | POA: Diagnosis not present

## 2019-04-22 DIAGNOSIS — Z86711 Personal history of pulmonary embolism: Secondary | ICD-10-CM | POA: Diagnosis not present

## 2019-04-22 NOTE — Progress Notes (Signed)
Reason for the request:    Venous thromboembolism  HPI: I was asked by Dr. Loma Sender  to evaluate [name] for the evaluation of venous thromboembolism.  He is a 54 year old man hospitalized in August 2019 with chest pain and acute renal failure related to nonsteroidal anti-inflammatories.  He has subsequently presented in August 2019 with acute right leg pain and swelling.  At that time his evaluation revealed a acute deep vein thrombosis of the right distal femoral popliteal vein without any iliac involvement.  CT scan of the chest showed bilateral lower lobe PE at that time.  He was started on heparin initially and has been on Xarelto since that time.  He had multiple follow-up lower extremity Dopplers with the most recent of which on April 20, 2019.  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  He did have an episode of chest pain on April 16, 2019 and CT scan did not show any evidence of pulmonary embolism.  He also had issues with rectal bleeding as well as epistaxis at times on Xarelto.  He has stopped Xarelto intermittently because of rectal bleeding, occasional hemoptysis and occasional epistaxis.  Hypercoagulable work-up obtained in 2019 did not show any clear-cut inherited or acquired thrombophilia.  His protein S was slightly low in the setting of acute thrombosis.  He has been evaluated previously by Dr. Audelia Hives and Dr. Frutoso Schatz at the time of diagnosis.  Clinically, he does report chronic knee pain and discomfort.  He did have knee replacement surgery which have caused some chronic swelling and discomfort.  He is participating in physical therapy and is able to ambulate without any major difficulties.  He does not report any headaches, blurry vision, syncope or seizures. Does not report any fevers, chills or sweats.  Does not report any cough, wheezing or hemoptysis.  Does not report any chest pain, palpitation, orthopnea or leg edema.  Does  not report any nausea, vomiting or abdominal pain.  Does not report any constipation or diarrhea.  Does not report any skeletal complaints.    Does not report frequency, urgency or hematuria.  Does not report any skin rashes or lesions. Does not report any heat or cold intolerance.  Does not report any lymphadenopathy or petechiae.  Does not report any anxiety or depression.  Remaining review of systems is negative.    Past Medical History:  Diagnosis Date  . Bronchitis   . Bronchitis   . DVT (deep venous thrombosis) ([place])   . Gout   . Hammertoe of second toe of right foot   . Smoker   :  Past Surgical History:  Procedure Laterality Date  . HAMMER TOE SURGERY Right 03/10/2016   Procedure: 2ND RIGHT HAMMER TOE CORRECTION;  Surgeon: Landis Martins, DPM;  Location: [facility];  Service: Podiatry;  Laterality: Right;  . MASS EXCISION Right 03/10/2016   Procedure: EXCISION OF CALLUS 2ND RIGHT TOE;  Surgeon: Landis Martins, DPM;  Location: [facility];  Service: Podiatry;  Laterality: Right;  . NO PAST SURGERIES    :   Current Outpatient Medications:  .  acetaminophen (TYLENOL) 500 MG tablet, Take 2 tablets (1,000 mg total) by mouth every 8 (eight) hours as needed for moderate pain., Disp: 30 tablet, Rfl: 0 .  acetaminophen (TYLENOL) 500 MG tablet, Take by mouth., Disp: , Rfl:  .  allopurinol (ZYLOPRIM) 300 MG tablet, Take 1 tablet (300 mg total) by mouth daily., Disp: 30 tablet,  Rfl: 3 .  allopurinol (ZYLOPRIM) 300 MG tablet, Take by mouth., Disp: , Rfl:  .  amoxicillin (AMOXIL) 500 MG capsule, Take 1 capsule (500 mg total) by mouth 3 (three) times daily. (Patient not taking: Reported on 03/17/2019), Disp: 30 capsule, Rfl: 0 .  atorvastatin (LIPITOR) 40 MG tablet, Take 1 tablet (40 mg total) by mouth daily., Disp: 30 tablet, Rfl: 3 .  atorvastatin (LIPITOR) 40 MG tablet, Take by mouth., Disp: , Rfl:  .  colchicine 0.6 MG tablet, Take 1 tablet (0.6 mg) at the onset of  a gout attack, may repeat 1 tablet in 2 hours if symptoms persist, Disp: 30 tablet, Rfl: 1 .  cyclobenzaprine (FLEXERIL) 10 MG tablet, Take 10 mg by mouth at bedtime., Disp: , Rfl:  .  cyclobenzaprine (FLEXERIL) 5 MG tablet, TAKE 1 TABLET (5 MG TOTAL) BY MOUTH 2 (TWO) TIMES DAILY AS NEEDED FOR MUSCLE SPASMS., Disp: 30 tablet, Rfl: 0 .  diclofenac (VOLTAREN) 75 MG EC tablet, Take 1 tablet (75 mg total) by mouth 2 (two) times daily., Disp: 20 tablet, Rfl: 0 .  diclofenac sodium (VOLTAREN) 1 % GEL, Apply 2 g topically 4 (four) times daily. (Patient not taking: Reported on 03/17/2019), Disp: 100 g, Rfl: 0 .  enoxaparin (LOVENOX) 40 MG/0.4ML injection, , Disp: , Rfl:  .  fluticasone (FLONASE) 50 MCG/ACT nasal spray, Place 2 sprays into both nostrils daily as needed for allergies., Disp: 9.9 mL, Rfl: 2 .  furosemide (LASIX) 20 MG tablet, Take 1 tablet (20 mg total) by mouth daily., Disp: 5 tablet, Rfl: 0 .  iron polysaccharides (NIFEREX) 150 MG capsule, Take by mouth., Disp: , Rfl:  .  lisinopril (ZESTRIL) 5 MG tablet, Take 1 tablet (5 mg total) by mouth daily., Disp: 30 tablet, Rfl: 3 .  lisinopril (ZESTRIL) 5 MG tablet, Take by mouth., Disp: , Rfl:  .  metFORMIN (GLUCOPHAGE) 500 MG tablet, Take 1 tablet (500 mg total) by mouth daily with breakfast., Disp: 30 tablet, Rfl: 3 .  metFORMIN (GLUCOPHAGE) 500 MG tablet, Take by mouth., Disp: , Rfl:  .  methocarbamol (ROBAXIN) 500 MG tablet, Take 1 tablet (500 mg total) by mouth 2 (two) times daily., Disp: 20 tablet, Rfl: 0 .  oxyCODONE (OXY IR/ROXICODONE) 5 MG immediate release tablet, Take 5 mg by mouth every 6 (six) hours as needed., Disp: , Rfl:  .  pantoprazole (PROTONIX) 40 MG tablet, TAKE 1 TABLET (40 MG TOTAL) BY MOUTH 2 (TWO) TIMES DAILY., Disp: 60 tablet, Rfl: 3 .  pantoprazole (PROTONIX) 40 MG tablet, Take by mouth., Disp: , Rfl:  .  predniSONE (DELTASONE) 20 MG tablet, Take 2 tablets daily with breakfast. (Patient not taking: Reported on 04/14/2019),  Disp: 10 tablet, Rfl: 0 .  rivaroxaban (XARELTO) 20 MG TABS tablet, TAKE 1 TABLET BY MOUTH DAILY WITH SUPPER., Disp: 30 tablet, Rfl: 2 .  tiZANidine (ZANAFLEX) 4 MG tablet, Take 1 tablet (4 mg total) by mouth every 8 (eight) hours as needed for muscle spasms., Disp: 90 tablet, Rfl: 0 .  traMADol (ULTRAM) 50 MG tablet, Take 50 mg by mouth 3 (three) times daily as needed., Disp: , Rfl:  .  VIAGRA 50 MG tablet, TAKE 1 TABLET BY MOUTH DAILY AS NEEDED FOR ERECTILE DYSFUNCTION. AT LEAST 24 HOURS BETWEEN DOSES (Patient not taking: No sig reported), Disp: 10 tablet, Rfl: 2:  Allergies  Allergen Reactions  . Vicodin [Hydrocodone-Acetaminophen] Hives    Patient stated he is no longer allergic to vicodin, patient stated  that he was allergic about 12 years ago.   :  Family History  Problem Relation Age of Onset  . Diabetes Mother   . Healthy Father   . Heart disease Neg Hx   . Kidney disease Neg Hx   :  Social History   Socioeconomic History  . Marital status: Single    Spouse name: Not on file  . Number of children: Not on file  . Years of education: Not on file  . Highest education level: Not on file  Occupational History  . Not on file  Tobacco Use  . Smoking status: Former Smoker    Packs/day: 0.25    Types: Cigarettes    Quit date: 01/25/2019    Years since quitting: 0.2  . Smokeless tobacco: Never Used  . Tobacco comment: 8 cigs daily  Substance and Sexual Activity  . Alcohol use: No  . Drug use: No  . Sexual activity: Yes  Other Topics Concern  . Not on file  Social History Narrative  . Not on file   Social Determinants of Health   Financial Resource Strain:   . Difficulty of Paying Living Expenses:   Food Insecurity:   . Worried About Charity fundraiser in the Last Year:   . Arboriculturist in the Last Year:   Transportation Needs:   . Film/video editor (Medical):   Marland Kitchen Lack of Transportation (Non-Medical):   Physical Activity:   . Days of Exercise per Week:    . Minutes of Exercise per Session:   Stress:   . Feeling of Stress :   Social Connections:   . Frequency of Communication with Friends and Family:   . Frequency of Social Gatherings with Friends and Family:   . Attends Religious Services:   . Active Member of Clubs or Organizations:   . Attends Archivist Meetings:   Marland Kitchen Marital Status:   Intimate Partner Violence:   . Fear of Current or Ex-Partner:   . Emotionally Abused:   Marland Kitchen Physically Abused:   . Sexually Abused:   :  Pertinent items are noted in HPI.  Exam: Blood pressure (!) 148/96, pulse 87, temperature 97.8 F (36.6 C), temperature source Temporal, resp. rate 18, height 5\' 9"  (1.753 m), weight 242 lb 14.4 oz (110.2 kg), SpO2 100 %.   ECOG 1 General appearance: alert and cooperative appeared without distress. Head: atraumatic without any abnormalities. Eyes: conjunctivae/corneas clear. PERRL.  Sclera anicteric. Throat: lips, mucosa, and tongue normal; without oral thrush or ulcers. Resp: clear to auscultation bilaterally without rhonchi, wheezes or dullness to percussion. Cardio: regular rate and rhythm, S1, S2 normal, no murmur, click, rub or gallop.  No edema noted. GI: soft, non-tender; bowel sounds normal; no masses,  no organomegaly Skin: Skin color, texture, turgor normal. No rashes or lesions Lymph nodes: Cervical, supraclavicular, and axillary nodes normal. Neurologic: Grossly normal without any motor, sensory or deep tendon reflexes. Musculoskeletal: Mild effusion noted on his right knee.    CT Angio Chest PE W and/or Wo Contrast  Result Date: 04/16/2019 CLINICAL DATA:  Back pain EXAM: CT ANGIOGRAPHY CHEST WITH CONTRAST TECHNIQUE: Multidetector CT imaging of the chest was performed using the standard protocol during bolus administration of intravenous contrast. Multiplanar CT image reconstructions and MIPs were obtained to evaluate the vascular anatomy. CONTRAST:  1102mL OMNIPAQUE IOHEXOL 350 MG/ML  SOLN COMPARISON:  10/21/2018 FINDINGS: Cardiovascular: Satisfactory opacification of the pulmonary arteries to the segmental level. No evidence of  pulmonary embolism. Normal heart size. No pericardial effusion. Mediastinum/Nodes: No enlarged mediastinal, hilar, or axillary lymph nodes. Thyroid gland, trachea, and esophagus demonstrate no significant findings. Lungs/Pleura: Lungs are clear. No pleural effusion or pneumothorax. Upper Abdomen: No acute abnormality. Musculoskeletal: No chest wall abnormality. No acute or significant osseous findings. Review of the MIP images confirms the above findings. IMPRESSION: Negative examination for pulmonary embolism. Electronically Signed   By: Eddie Candle M.D.   On: 04/16/2019 18:03   VAS [place] LOWER EXTREMITY VENOUS (DVT)  Result Date: 04/20/2019  Lower Venous DVTStudy Indications: Swelling, and History of right leg DVT found on 10/02/17.  Anticoagulation: None. Comparison Study: 02/13/19 - Right LEV - chronic popliteal thrombus. Performing Technologist: Oda Cogan RDMS, RVT  Examination Guidelines: A complete evaluation includes B-mode imaging, spectral Doppler, color Doppler, and power Doppler as needed of all accessible portions of each vessel. Bilateral testing is considered an integral part of a complete examination. Limited examinations for reoccurring indications may be performed as noted. The reflux portion of the exam is performed with the patient in reverse Trendelenburg.  +---------+---------------+---------+-----------+----------+--------------+ RIGHT    CompressibilityPhasicitySpontaneityPropertiesThrombus Aging +---------+---------------+---------+-----------+----------+--------------+ CFV      Full           Yes      Yes                                 +---------+---------------+---------+-----------+----------+--------------+ SFJ      Full                                                         +---------+---------------+---------+-----------+----------+--------------+ FV Prox  Full                                                        +---------+---------------+---------+-----------+----------+--------------+ FV Mid   Full                                                        +---------+---------------+---------+-----------+----------+--------------+ FV DistalFull                                                        +---------+---------------+---------+-----------+----------+--------------+ PFV      Full                                                        +---------+---------------+---------+-----------+----------+--------------+ POP      Partial        Yes      Yes                  Chronic        +---------+---------------+---------+-----------+----------+--------------+  PTV      Full                                                        +---------+---------------+---------+-----------+----------+--------------+ PERO     Full                                                        +---------+---------------+---------+-----------+----------+--------------+   +---------+---------------+---------+-----------+----------+--------------+ LEFT     CompressibilityPhasicitySpontaneityPropertiesThrombus Aging +---------+---------------+---------+-----------+----------+--------------+ CFV      Full           No       Yes                                 +---------+---------------+---------+-----------+----------+--------------+ SFJ      Full                                                        +---------+---------------+---------+-----------+----------+--------------+ FV Prox  Full                                                        +---------+---------------+---------+-----------+----------+--------------+ FV Mid   Full                                                         +---------+---------------+---------+-----------+----------+--------------+ FV DistalFull                                                        +---------+---------------+---------+-----------+----------+--------------+ PFV      Full                                                        +---------+---------------+---------+-----------+----------+--------------+ POP      Full           Yes      Yes                                 +---------+---------------+---------+-----------+----------+--------------+ PTV      Full                                                        +---------+---------------+---------+-----------+----------+--------------+  PERO     Full                                                        +---------+---------------+---------+-----------+----------+--------------+    Summary: RIGHT: - Findings consistent with chronic deep vein thrombosis involving the right popliteal vein. - Findings appear essentially unchanged compared to previous examination. - No cystic structure found in the popliteal fossa.  LEFT: - There is no evidence of deep vein thrombosis in the lower extremity.  - No cystic structure found in the popliteal fossa.  *See table(s) above for measurements and observations. Electronically signed by Curt Jews MD on 04/20/2019 at 3:48:08 PM.    Final     Assessment and Plan:    54 year old man with:  1.  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  He was found to have right popliteal vein thrombosis as well as bilateral pulmonary emboli involving the lower lobe segmental and subsegmental branches.  This was in the setting of a recent hospitalization and immobilization and appears to be provoked.  He has been on Xarelto since that time has had multiple imaging studies of the chest and February 2020, June 2020, September 2020 and recently in March 2021.  He also ultrasound Doppler of her lower extremity which showed chronic thrombosis  with recannulization of his popliteal vein.  He did have episodic mild bleeding since that time.  Treatment options and the natural course of venous thromboembolism was reviewed.  His thrombosis appeared to be provoked because of acute illness and immobilization which led to DVT and PE.  At that time 6 months of anticoagulation may be reasonable and he had received longer treatment than that.  I do not feel continuous anticoagulation is needed given the fact that his popliteal vein thrombosis appears to be chronic and recannulization has occurred.  I recommended discontinuation of Xarelto at this time.  He understands he might require lifetime anticoagulation if any additional thrombosis occurs in the future.  I do not think his knee pain or leg pain in general is related to this thrombosis.  2.  Follow-up: I am happy to see him in the future as needed.  45  minutes were dedicated to this visit. The time was spent on reviewing laboratory data, imaging studies, discussing treatment options, and answering questions regarding future plan.      A copy of this consult has been forwarded to the requesting physician.

## 2019-04-22 NOTE — Telephone Encounter (Signed)
I would recommend he receive the COVID-19 vaccine

## 2019-04-25 NOTE — Telephone Encounter (Signed)
Patient was called and informed of PCP recommendations.

## 2019-04-28 MED FILL — LISINOPRIL 5 MG TABLET: 5 | 30 days supply | Qty: 30 | Fill #1

## 2019-05-05 MED FILL — SILDENAFIL CITRATE 50 MG TA: 50 | 30 days supply | Qty: 4 | Fill #3

## 2019-05-05 MED FILL — ALLOPURINOL 300 MG TAB: 300 | 30 days supply | Qty: 30 | Fill #1

## 2019-05-24 ENCOUNTER — Other Ambulatory Visit: Payer: Self-pay | Admitting: Family Medicine

## 2019-05-24 DIAGNOSIS — M1A0720 Idiopathic chronic gout, left ankle and foot, without tophus (tophi): Secondary | ICD-10-CM

## 2019-05-24 MED FILL — ATORVASTATIN CALCIUM 40 MG: 40 | 30 days supply | Qty: 30 | Fill #1

## 2019-05-24 MED FILL — PANTOPRAZOLE SOD DR 40 MG T: 40 | 30 days supply | Qty: 60 | Fill #1

## 2019-05-25 MED FILL — MITIGARE 0.6 MG CAPSULE: 0.6 | 15 days supply | Qty: 30 | Fill #0

## 2019-05-26 ENCOUNTER — Other Ambulatory Visit: Payer: Self-pay | Admitting: Pharmacist

## 2019-05-26 MED ORDER — METHOCARBAMOL 500 MG PO TABS: 500.0000 mg | ORAL_TABLET | Freq: Two times a day (BID) | ORAL | 0 refills | Status: DC

## 2019-05-26 MED FILL — METHOCARBAMOL 500 MG TABS: 500 | 10 days supply | Qty: 20 | Fill #0

## 2019-05-30 ENCOUNTER — Telehealth: Payer: Self-pay | Admitting: Family Medicine

## 2019-05-30 DIAGNOSIS — F99 Mental disorder, not otherwise specified: Secondary | ICD-10-CM

## 2019-05-30 NOTE — Telephone Encounter (Signed)
I have placed referral to [facility] as requested.  Can you please provide him with the mobile crisis number?  Also have [facility] reach out to him.  Thank you
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
## 2019-05-30 NOTE — Telephone Encounter (Signed)
Patient called in and requested for a referral to be sent to [facility] in [place] to help with his mental health. Please follow up at your earliest convenience.

## 2019-05-30 NOTE — Telephone Encounter (Signed)
Delana Meyer has been notified to reach out to patient.

## 2019-06-20 ENCOUNTER — Ambulatory Visit: Payer: Self-pay | Attending: Family Medicine | Admitting: Family Medicine

## 2019-06-20 ENCOUNTER — Encounter: Payer: Self-pay | Admitting: Family Medicine

## 2019-06-20 ENCOUNTER — Ambulatory Visit: Payer: Medicaid Other | Attending: Family Medicine | Admitting: Licensed Clinical Social Worker

## 2019-06-20 ENCOUNTER — Other Ambulatory Visit: Payer: Self-pay

## 2019-06-20 VITALS — BP 167/94 | HR 85 | Ht 69.0 in | Wt 241.8 lb

## 2019-06-20 DIAGNOSIS — F3289 Other specified depressive episodes: Secondary | ICD-10-CM

## 2019-06-20 DIAGNOSIS — F332 Major depressive disorder, recurrent severe without psychotic features: Secondary | ICD-10-CM

## 2019-06-20 DIAGNOSIS — Z029 Encounter for administrative examinations, unspecified: Secondary | ICD-10-CM

## 2019-06-20 DIAGNOSIS — E119 Type 2 diabetes mellitus without complications: Secondary | ICD-10-CM

## 2019-06-20 DIAGNOSIS — Z96651 Presence of right artificial knee joint: Secondary | ICD-10-CM

## 2019-06-20 DIAGNOSIS — I82401 Acute embolism and thrombosis of unspecified deep veins of right lower extremity: Secondary | ICD-10-CM

## 2019-06-20 DIAGNOSIS — Z0289 Encounter for other administrative examinations: Secondary | ICD-10-CM

## 2019-06-20 LAB — POCT GLYCOSYLATED HEMOGLOBIN (HGB A1C): HbA1c, POC (controlled diabetic range): 7.4 % — AB (ref 0.0–7.0)

## 2019-06-20 LAB — GLUCOSE, POCT (MANUAL RESULT ENTRY): POC Glucose: 180 mg/dl — AB (ref 70–99)

## 2019-06-20 MED FILL — SILDENAFIL CITRATE 50 MG TA: 50 | 30 days supply | Qty: 4 | Fill #4

## 2019-06-20 MED FILL — METFORMIN HCL 500 MG TABS: 500 | 30 days supply | Qty: 30 | Fill #1

## 2019-06-20 MED FILL — ALLOPURINOL 300 MG TAB: 300 | 30 days supply | Qty: 30 | Fill #2

## 2019-06-20 MED FILL — LISINOPRIL 5 MG TABLET: 5 | 30 days supply | Qty: 30 | Fill #2

## 2019-06-20 NOTE — Progress Notes (Signed)
Patient is having knee and hip pain.  Patient has disability paperwork to be filled out.

## 2019-06-20 NOTE — Progress Notes (Signed)
Subjective:  Patient ID: Terry Poole, male    DOB: 1965/06/23  Age: 54 y.o. MRN: [id number]  CC: Knee Pain and Hip Pain   HPI Terry Poole is a 54 year old male with a history of type 2 diabetes mellitus (A1c 7.4) Hyperlipidemia, hypertension, gout, bilateral PE and right lower extremity DVT (diagnosed in 09/2017), s/p R TKRwho presents today for completion of disability paperwork.  He has disability paperwork from his attorney to be completed for Physical and Psychological conditions. He is seeking this disability due to his chronic low back pain, knee pain and right leg DVT. I am aware of his Physical conditions however he informs me he has Depression which is new to me and he is scheduled to see a Psychiatrist on 6/1 -[facility]. He informs me he has been trying to cope with this by means of his church family and so has not sought help for his mental health condition. His PHQ-9 scores have all been under 6 but today his PHQ-9 is 27 and he endorses suicidal ideations but denies any intent.  Past Medical History:  Diagnosis Date  . Bronchitis   . Bronchitis   . DVT (deep venous thrombosis) ([place])   . Gout   . Hammertoe of second toe of right foot   . Smoker     Past Surgical History:  Procedure Laterality Date  . HAMMER TOE SURGERY Right 03/10/2016   Procedure: 2ND RIGHT HAMMER TOE CORRECTION;  Surgeon: Landis Martins, DPM;  Location: [place];  Service: Podiatry;  Laterality: Right;  . MASS EXCISION Right 03/10/2016   Procedure: EXCISION OF CALLUS 2ND RIGHT TOE;  Surgeon: Landis Martins, DPM;  Location: [place];  Service: Podiatry;  Laterality: Right;  . NO PAST SURGERIES      Family History  Problem Relation Age of Onset  . Diabetes Mother   . Healthy Father   . Heart disease Neg Hx   . Kidney disease Neg Hx     Allergies  Allergen Reactions  . Vicodin [Hydrocodone-Acetaminophen] Hives    Patient stated he is no longer  allergic to vicodin, patient stated that he was allergic about 12 years ago.     Outpatient Medications Prior to Visit  Medication Sig Dispense Refill  . acetaminophen (TYLENOL) 500 MG tablet Take 2 tablets (1,000 mg total) by mouth every 8 (eight) hours as needed for moderate pain. 30 tablet 0  . acetaminophen (TYLENOL) 500 MG tablet Take by mouth.    Marland Kitchen allopurinol (ZYLOPRIM) 300 MG tablet Take 1 tablet (300 mg total) by mouth daily. 30 tablet 3  . allopurinol (ZYLOPRIM) 300 MG tablet Take by mouth.    Marland Kitchen atorvastatin (LIPITOR) 40 MG tablet Take 1 tablet (40 mg total) by mouth daily. 30 tablet 3  . atorvastatin (LIPITOR) 40 MG tablet Take by mouth.    . cyclobenzaprine (FLEXERIL) 10 MG tablet Take 10 mg by mouth at bedtime.    . cyclobenzaprine (FLEXERIL) 5 MG tablet TAKE 1 TABLET (5 MG TOTAL) BY MOUTH 2 (TWO) TIMES DAILY AS NEEDED FOR MUSCLE SPASMS. 30 tablet 0  . diclofenac (VOLTAREN) 75 MG EC tablet Take 1 tablet (75 mg total) by mouth 2 (two) times daily. 20 tablet 0  . enoxaparin (LOVENOX) 40 MG/0.4ML injection     . fluticasone (FLONASE) 50 MCG/ACT nasal spray Place 2 sprays into both nostrils daily as needed for allergies. 9.9 mL 2  . furosemide (LASIX) 20 MG tablet Take 1 tablet (20  mg total) by mouth daily. 5 tablet 0  . iron polysaccharides (NIFEREX) 150 MG capsule Take by mouth.    Marland Kitchen lisinopril (ZESTRIL) 5 MG tablet Take 1 tablet (5 mg total) by mouth daily. 30 tablet 3  . lisinopril (ZESTRIL) 5 MG tablet Take by mouth.    . metFORMIN (GLUCOPHAGE) 500 MG tablet Take 1 tablet (500 mg total) by mouth daily with breakfast. 30 tablet 3  . metFORMIN (GLUCOPHAGE) 500 MG tablet Take by mouth.    . methocarbamol (ROBAXIN) 500 MG tablet Take 1 tablet (500 mg total) by mouth 2 (two) times daily. 20 tablet 0  . MITIGARE 0.6 MG CAPS TAKE 1 CAPSULE (0.6 MG) BY MOUTH AT THE ONSET OF A GOUT ATTACK, MAY REPEAT 1 CAPSULE IN 2 HOURS IF SYMPTOMS PERSIST 30 capsule 1  . pantoprazole (PROTONIX) 40 MG  tablet TAKE 1 TABLET (40 MG TOTAL) BY MOUTH 2 (TWO) TIMES DAILY. 60 tablet 3  . pantoprazole (PROTONIX) 40 MG tablet Take by mouth.    . rivaroxaban (XARELTO) 20 MG TABS tablet TAKE 1 TABLET BY MOUTH DAILY WITH SUPPER. 30 tablet 2  . tiZANidine (ZANAFLEX) 4 MG tablet Take 1 tablet (4 mg total) by mouth every 8 (eight) hours as needed for muscle spasms. 90 tablet 0  . amoxicillin (AMOXIL) 500 MG capsule Take 1 capsule (500 mg total) by mouth 3 (three) times daily. (Patient not taking: Reported on 03/17/2019) 30 capsule 0  . diclofenac sodium (VOLTAREN) 1 % GEL Apply 2 g topically 4 (four) times daily. (Patient not taking: Reported on 03/17/2019) 100 g 0  . oxyCODONE (OXY IR/ROXICODONE) 5 MG immediate release tablet Take 5 mg by mouth every 6 (six) hours as needed.    . predniSONE (DELTASONE) 20 MG tablet Take 2 tablets daily with breakfast. (Patient not taking: Reported on 04/14/2019) 10 tablet 0  . traMADol (ULTRAM) 50 MG tablet Take 50 mg by mouth 3 (three) times daily as needed.    Marland Kitchen VIAGRA 50 MG tablet TAKE 1 TABLET BY MOUTH DAILY AS NEEDED FOR ERECTILE DYSFUNCTION. AT LEAST 24 HOURS BETWEEN DOSES (Patient not taking: No sig reported) 10 tablet 2   No facility-administered medications prior to visit.     ROS Review of Systems  Constitutional: Negative for activity change and appetite change.  HENT: Negative for sinus pressure and sore throat.   Eyes: Negative for visual disturbance.  Respiratory: Negative for cough, chest tightness and shortness of breath.   Cardiovascular: Negative for chest pain and leg swelling.  Gastrointestinal: Negative for abdominal distention, abdominal pain, constipation and diarrhea.  Endocrine: Negative.   Genitourinary: Negative for dysuria.  Musculoskeletal:       See HPI  Skin: Negative for rash.  Allergic/Immunologic: Negative.   Neurological: Negative for weakness, light-headedness and numbness.  Psychiatric/Behavioral: Negative for dysphoric mood and  suicidal ideas.    Objective:  BP (!) 167/94   Pulse 85   Ht 5\' 9"  (1.753 m)   Wt 241 lb 12.8 oz (109.7 kg)   SpO2 97%   BMI 35.71 kg/m   BP/Weight 06/20/2019 04/22/2019 [id number]  Systolic BP [id number]  Diastolic BP 94 96 90  Wt. (Lbs) 241.8 242.9 235  BMI 35.71 35.87 34.7      Physical Exam Constitutional:      Appearance: He is well-developed.  Neck:     Vascular: No JVD.  Cardiovascular:     Rate and Rhythm: Normal rate.     Heart sounds: Normal  heart sounds. No murmur.  Pulmonary:     Effort: Pulmonary effort is normal.     Breath sounds: Normal breath sounds. No wheezing or rales.  Chest:     Chest wall: No tenderness.  Abdominal:     General: Bowel sounds are normal. There is no distension.     Palpations: Abdomen is soft. There is no mass.     Tenderness: There is no abdominal tenderness.  Musculoskeletal:        General: Normal range of motion.     Right lower leg: No edema.     Left lower leg: No edema.  Neurological:     Mental Status: He is alert and oriented to person, place, and time.  Psychiatric:        Mood and Affect: Mood normal.     CMP Latest Ref Rng & Units 04/16/2019 02/12/2019 10/21/2018  Glucose 70 - 99 mg/dL 186(H) 163(H) 102(H)  BUN 6 - 20 mg/dL 15 12 13   Creatinine 0.61 - 1.24 mg/dL 1.20 0.88 0.98  Sodium 135 - 145 mmol/L 141 138 139  Potassium 3.5 - 5.1 mmol/L 4.4 3.7 3.6  Chloride 98 - 111 mmol/L 109 106 107  CO2 22 - 32 mmol/L 19(L) 23 21(L)  Calcium 8.9 - 10.3 mg/dL 9.0 9.1 9.1  Total Protein 6.5 - 8.1 g/dL 7.1 7.1 -  Total Bilirubin 0.3 - 1.2 mg/dL 0.5 0.6 -  Alkaline Phos 38 - 126 U/L 100 132(H) -  AST 15 - 41 U/L 32 17 -  ALT 0 - 44 U/L 61(H) 29 -    Lipid Panel     Component Value Date/Time   CHOL 128 03/14/2019 1410   TRIG 330 (H) 03/14/2019 1410   HDL 44 03/14/2019 1410   CHOLHDL 2.9 03/14/2019 1410   CHOLHDL 2.6 09/21/2015 1053   VLDL 47 (H) 09/21/2015 1053   LDLCALC 36 03/14/2019 1410    CBC      Component Value Date/Time   WBC 7.0 04/16/2019 1414   RBC 4.73 04/16/2019 1414   HGB 12.9 (L) 04/16/2019 1414   HGB 13.0 03/18/2019 1410   HCT 42.0 04/16/2019 1414   HCT 39.8 03/18/2019 1410   PLT 272 04/16/2019 1414   PLT 300 03/18/2019 1410   MCV 88.8 04/16/2019 1414   MCV 85 03/18/2019 1410   MCH 27.3 04/16/2019 1414   MCHC 30.7 04/16/2019 1414   RDW 15.6 (H) 04/16/2019 1414   RDW 14.0 03/18/2019 1410   LYMPHSABS 3.6 (H) 03/18/2019 1410   MONOABS 0.3 09/22/2018 1156   EOSABS 0.2 03/18/2019 1410   BASOSABS 0.0 03/18/2019 1410    Lab Results  Component Value Date   HGBA1C 7.4 (A) 06/20/2019    Depression screen PHQ 2/9 06/20/2019 03/17/2019 05/20/2018  Decreased Interest 3 0 0  Down, Depressed, Hopeless 3 1 0  PHQ - 2 Score 6 1 0  Altered sleeping 3 1 -  Tired, decreased energy 3 1 -  Change in appetite 3 0 -  Feeling bad or failure about yourself  3 1 -  Trouble concentrating 3 0 -  Moving slowly or fidgety/restless 3 0 -  Suicidal thoughts 3 1 -  PHQ-9 Score 27 5 -  Some recent data might be hidden    Assessment & Plan:  1. Type 2 diabetes mellitus without complication, without long-term current use of insulin (HCC) A1c of 7.4 Chronic disease management at next visit - POCT glucose (manual entry) - POCT glycosylated hemoglobin (  Hb A1C)  2. Deep vein thrombosis (DVT) of right lower extremity, unspecified chronicity, unspecified vein (HCC) Xarelto discontinued at visit with oncology No indication for lifelong anticoagulation at this time per Oncology except if he develops another thrombotic event.  3. Other depression PHQ scores have been all under 6 until today when he scored a 20 LCSW called in for counseling and to determine need for crises line or commitment given suicidal ideation Advised unfortunately I am unable to complete disability form for Psychological conditions (as this is new to me with no record in his chart previously) but will complete that for  Physical conditions Advised to keep appointment with Psych for initiation of ,medications  4. Total knee replacement status, right Completed disability paperwork to this effect  5.  Encounter for administrative purposes See #4 above   Return in about 1 month (around 07/21/2019) for chronic disease management.      [name], MD, FAAFP. [facility] [place], [place]   06/20/2019, 4:22 PM

## 2019-07-06 ENCOUNTER — Ambulatory Visit: Payer: Self-pay | Admitting: Licensed Clinical Social Worker

## 2019-07-06 ENCOUNTER — Other Ambulatory Visit: Payer: Self-pay

## 2019-07-07 ENCOUNTER — Ambulatory Visit: Payer: Self-pay | Attending: Family Medicine | Admitting: Licensed Clinical Social Worker

## 2019-07-07 ENCOUNTER — Telehealth: Payer: Self-pay | Admitting: Licensed Clinical Social Worker

## 2019-07-07 ENCOUNTER — Other Ambulatory Visit: Payer: Self-pay

## 2019-07-07 DIAGNOSIS — F4323 Adjustment disorder with mixed anxiety and depressed mood: Secondary | ICD-10-CM

## 2019-07-07 NOTE — Telephone Encounter (Signed)
LCSW placed call regarding scheduled appointment. LCSW left message requesting a return call.

## 2019-07-15 NOTE — BH Specialist Note (Signed)
[place] Visit via Telemedicine (Telephone)  07/07/2019 Terry Poole [id number]   Session Start time: 4:30 PM  Session End time: 5:00 PM  Total time: 30  Referring Provider: Dr. Margarita Rana Type of Visit: Telephonic Patient location: Home [facility] Provider location: Office All persons participating in visit: LCSW and patient  Confirmed patient's address: Yes  Confirmed patient's phone number: Yes  Any changes to demographics: No   Confirmed patient's insurance: Yes  Any changes to patient's insurance: No   Discussed confidentiality: Yes    The following statements were read to the patient and/or legal guardian that are established with the [facility] Provider.  "The purpose of this phone visit is to provide behavioral health care while limiting exposure to the coronavirus (COVID19).  There is a possibility of technology failure and discussed alternative modes of communication if that failure occurs."  "By engaging in this telephone visit, you consent to the provision of healthcare.  Additionally, you authorize for your insurance to be billed for the services provided during this telephone visit."   Patient and/or legal guardian consented to telephone visit: Yes   PRESENTING CONCERNS: Patient and/or family reports the following symptoms/concerns: Patient reports increase in depression anxiety symptoms.  Patient shared that he recently completed initial appointment with [facility] for medication management and counseling Duration of problem: Ongoing; Severity of problem: severe  STRENGTHS (Protective Factors/Coping Skills): Patient has good insight Patient has desire for change Patient has initiated mental health services Pt has pending disability claim and has lawyer to provide assistance  GOALS ADDRESSED: Patient will: 1.  Reduce symptoms of: anxiety and depression  2.  Increase knowledge and/or ability of: coping skills and healthy habits  3.   Demonstrate ability to: Increase healthy adjustment to current life circumstances and Increase adequate support systems for patient/family  INTERVENTIONS: Interventions utilized:  Solution-Focused Strategies, Supportive Counseling and Psychoeducation and/or Health Education Standardized Assessments completed: C-SSRS Short  ASSESSMENT: Patient currently experiencing increase in depression and anxiety symptoms triggered by chronic pain and psychosocial stressors.  Patient endorses suicidal ideations daily with no intent or plan.  Protective factors identified, safety plan discussed, and crisis intervention resources provided.   Patient may benefit from new medication management and psychotherapy validation and support provided.  Therapeutic strategies discussed to assist with decrease and/or management of symptoms.  Patient was successful in identifying healthy coping skills and received strong support from church members.  PLAN: 1. Follow up with behavioral health clinician on : Pt was encouraged to contact LCSWA if symptoms worsen or fail to improve to schedule behavioral appointments at [facility]. 2. Behavioral recommendations: Utilize strategies discussed and continue to engage in services through [facility] 3. Referral(s): [place] (In Clinic)  Rebekah Chesterfield, [place] 07/15/19 9:53 PM

## 2019-07-20 ENCOUNTER — Other Ambulatory Visit: Payer: Self-pay | Admitting: Family Medicine

## 2019-07-20 MED FILL — LISINOPRIL 5 MG TABLET: 5 | 30 days supply | Qty: 30 | Fill #3

## 2019-07-20 MED FILL — SILDENAFIL CITRATE 50 MG TA: 50 | 30 days supply | Qty: 4 | Fill #5

## 2019-07-20 MED FILL — METHOCARBAMOL 500 MG TABS: 500 | 10 days supply | Qty: 20 | Fill #0

## 2019-07-22 ENCOUNTER — Other Ambulatory Visit: Payer: Self-pay | Admitting: Family Medicine

## 2019-07-22 ENCOUNTER — Other Ambulatory Visit: Payer: Self-pay

## 2019-07-25 ENCOUNTER — Ambulatory Visit (HOSPITAL_BASED_OUTPATIENT_CLINIC_OR_DEPARTMENT_OTHER): Payer: No Typology Code available for payment source | Admitting: Licensed Clinical Social Worker

## 2019-07-25 ENCOUNTER — Encounter: Payer: Self-pay | Admitting: Family Medicine

## 2019-07-25 ENCOUNTER — Other Ambulatory Visit: Payer: Self-pay | Admitting: Family Medicine

## 2019-07-25 ENCOUNTER — Ambulatory Visit: Payer: No Typology Code available for payment source | Attending: Family Medicine | Admitting: Family Medicine

## 2019-07-25 ENCOUNTER — Other Ambulatory Visit: Payer: Self-pay

## 2019-07-25 VITALS — BP 150/88 | HR 94 | Ht 69.0 in | Wt 240.0 lb

## 2019-07-25 DIAGNOSIS — G44209 Tension-type headache, unspecified, not intractable: Secondary | ICD-10-CM

## 2019-07-25 DIAGNOSIS — I10 Essential (primary) hypertension: Secondary | ICD-10-CM | POA: Diagnosis not present

## 2019-07-25 DIAGNOSIS — E119 Type 2 diabetes mellitus without complications: Secondary | ICD-10-CM | POA: Diagnosis not present

## 2019-07-25 DIAGNOSIS — M545 Low back pain: Secondary | ICD-10-CM | POA: Diagnosis not present

## 2019-07-25 DIAGNOSIS — F3289 Other specified depressive episodes: Secondary | ICD-10-CM

## 2019-07-25 DIAGNOSIS — M1A0720 Idiopathic chronic gout, left ankle and foot, without tophus (tophi): Secondary | ICD-10-CM | POA: Diagnosis not present

## 2019-07-25 DIAGNOSIS — F332 Major depressive disorder, recurrent severe without psychotic features: Secondary | ICD-10-CM | POA: Diagnosis not present

## 2019-07-25 DIAGNOSIS — G8929 Other chronic pain: Secondary | ICD-10-CM

## 2019-07-25 LAB — GLUCOSE, POCT (MANUAL RESULT ENTRY): POC Glucose: 173 mg/dl — AB (ref 70–99)

## 2019-07-25 MED ORDER — LISINOPRIL 10 MG PO TABS: 10.0000 mg | ORAL_TABLET | Freq: Every day | ORAL | 3 refills | Status: DC

## 2019-07-25 MED ORDER — ATORVASTATIN CALCIUM 40 MG PO TABS: 40.0000 mg | ORAL_TABLET | Freq: Every day | ORAL | 3 refills | Status: DC

## 2019-07-25 MED ORDER — FLUOXETINE HCL 20 MG PO TABS: 20.0000 mg | ORAL_TABLET | Freq: Every day | ORAL | 3 refills | Status: DC

## 2019-07-25 MED ORDER — METFORMIN HCL 500 MG PO TABS: 500.0000 mg | ORAL_TABLET | Freq: Two times a day (BID) | ORAL | 3 refills | Status: DC

## 2019-07-25 MED ORDER — ALLOPURINOL 300 MG PO TABS: 300.0000 mg | ORAL_TABLET | Freq: Every day | ORAL | 3 refills | Status: DC

## 2019-07-25 MED ORDER — TOPIRAMATE 50 MG PO TABS: 50.0000 mg | ORAL_TABLET | Freq: Two times a day (BID) | ORAL | 3 refills | Status: DC

## 2019-07-25 MED ORDER — PANTOPRAZOLE SODIUM 40 MG PO TBEC: 40.0000 mg | DELAYED_RELEASE_TABLET | Freq: Two times a day (BID) | ORAL | 3 refills | Status: DC
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
## 2019-07-25 NOTE — Patient Instructions (Signed)
Persistent Depressive Disorder  Persistent depressive disorder (PDD) is a mental health condition. PDD causes symptoms of low-level depression for 2 years or longer. It may also be called long-term (chronic) depression or dysthymia. PDD may include episodes of more severe depression that last for about 2 weeks (major depressive disorder or MDD). PDD can affect the way you think, feel, and sleep. This condition may also affect your relationships. You may be more likely to get sick if you have PDD. Symptoms of PDD occur for most of the day and may include:  Feeling tired (fatigue).  Low energy.  Eating too much or too little.  Sleeping too much or too little.  Feeling restless or agitated.  Feeling hopeless.  Feeling worthless or guilty.  Feeling worried or nervous (anxiety).  Trouble concentrating or making decisions.  Low self-esteem.  A negative way of looking at things ([place]).  Not being able to have fun or feel pleasure.  Avoiding interacting with people.  Getting angry or annoyed easily (irritability).  Acting aggressive or angry. Follow these instructions at home: Activity  Go back to your normal activities as told by your doctor.  Exercise regularly as told by your doctor. General instructions  Take over-the-counter and prescription medicines only as told by your doctor.  Do not drink alcohol. Or, limit how much alcohol you drink to no more than 1 drink a day for nonpregnant women and 2 drinks a day for men. One drink equals 12 oz of beer, 5 oz of wine, or 1 oz of hard liquor. Alcohol can affect any antidepressant medicines you are taking. Talk with your doctor about your alcohol use.  Eat a healthy diet and get plenty of sleep.  Find activities that you enjoy each day.  Consider joining a support group. Your doctor may be able to suggest a support group.  Keep all follow-up visits as told by your doctor. This is important. Where to find more  information National Alliance on Mental Illness  www.nami.org [place] National Institute of Mental Health  www.nimh.nih.gov National Suicide Prevention Lifeline  (1-800-273-8255).  This is free, 24-hour help. Contact a doctor if:  Your symptoms get worse.  You have new symptoms.  You have trouble sleeping or doing your daily activities. Get help right away if:  You self-harm.  You have serious thoughts about hurting yourself or others.  You see, hear, taste, smell, or feel things that are not there (hallucinate). This information is not intended to replace advice given to you by your health care provider. Make sure you discuss any questions you have with your health care provider. Document Revised: 01/02/2017 Document Reviewed: 09/14/2015 Elsevier Patient Education  2020 Elsevier Inc.  

## 2019-07-26 MED FILL — PANTOPRAZOLE SOD DR 40 MG T: 40 | 30 days supply | Qty: 60 | Fill #0

## 2019-07-26 MED FILL — METFORMIN HCL 500 MG TABS: 500 | 30 days supply | Qty: 60 | Fill #0

## 2019-07-26 MED FILL — FLUoxetine HCL 20 MG TABS: 20 | 30 days supply | Qty: 30 | Fill #0

## 2019-07-26 MED FILL — ALLOPURINOL 300 MG TAB: 300 | 30 days supply | Qty: 30 | Fill #0

## 2019-07-26 MED FILL — TOPIRAMATE 50 MG TABLET: 50 | 30 days supply | Qty: 60 | Fill #0

## 2019-07-26 MED FILL — LISINOPRIL 10 MG TABS: 10 | 30 days supply | Qty: 30 | Fill #0

## 2019-07-26 MED FILL — ATORVASTATIN CALCIUM 40 MG: 40 | 30 days supply | Qty: 30 | Fill #0

## 2019-07-26 NOTE — BH Specialist Note (Signed)
Integrated Behavioral Health Initial Visit  MRN: [id number] Name: Terry Poole  Number of [facility] Clinician visits:: 1/6 Session Start time: 4:30 PM  Session End time: 5:00 PM Total time: 30  Type of Service: [place] Interpretor:No. Interpretor Name and Language: NA   Warm Hand Off Completed.       SUBJECTIVE: Terry Poole is a 54 y.o. male accompanied by self Patient was referred by Dr. Margarita Rana for depression. Patient reports the following symptoms/concerns: Pt reports difficulty managing depression and anxiety. Symptoms include difficulty concentrating, lapses in memory, irritability, and withdrawn behavior. Pt endorses SI daily Duration of problem: Ongoing; Severity of problem: severe  OBJECTIVE: Mood: Depressed and Affect: Depressed Risk of harm to self or others: Suicidal ideation Thoughts of violence towards others No plan to harm self or others Pt screened positive on phq9;however, denies plan or intent to harm self or others. Protective factors identified, safety plan discussed, and crisis intervention resources provided  LIFE CONTEXT: Family and Social: Pt receives support from church. He resides with friends School/Work: Pt has an upcoming disability hearing and receives support from lawyer Self-Care: Pt has been referred to [facility] and has scheduled appointment on July 06, 2019 Life Changes: Pt experiencing psychosocial stressors  GOALS ADDRESSED: Patient will: 1. Reduce symptoms of: agitation, anxiety and depression 2. Increase knowledge and/or ability of: coping skills and healthy habits  3. Demonstrate ability to: Increase healthy adjustment to current life circumstances and Increase adequate support systems for patient/family  INTERVENTIONS: Interventions utilized: Solution-Focused Strategies, Supportive Counseling, Psychoeducation and/or Health Education and Link to [employer]  Standardized  Assessments completed: GAD-7 and PHQ 2&9 with C-SSRS  ASSESSMENT: Patient currently experiencing difficulty managing depression and anxiety.   Patient may benefit from medication management and therapy. Healthy coping skills discussed. Pt has upcoming scheduled appointment with [facility]. Safety plan discussed and crisis intervention resources provided  PLAN: 1. Follow up with behavioral health clinician on : 07/06/2019 2. Behavioral recommendations: Utilize strategies discussed and resources provided 3. Referral(s): [facility] (In Clinic) and [facility] (LME/Outside Clinic) 4. "From scale of 1-10, how likely are you to follow plan?":   Rebekah Chesterfield, LCSW 07/26/2019 9:12 AM

## 2019-08-03 NOTE — BH Specialist Note (Signed)
Integrated Behavioral Health Follow Up Visit  MRN: [id number] Name: Terry Poole  Number of [facility] Clinician visits: 2/6 Session Start time: 4:10 PM  Session End time: 4:35 PM Total time: 25  Type of Service: [place] Interpretor:No. Interpretor Name and Language: NA  SUBJECTIVE: Terry Poole is a 54 y.o. male accompanied by self Patient was referred by Dr. Margarita Rana for depression. Patient reports the following symptoms/concerns: Patient reports ongoing difficulty managing depression anxiety symptoms.  Patient shares increase in irritability Duration of problem: Ongoing; Severity of problem: severe  OBJECTIVE: Mood: Depressed and Affect: Appropriate Risk of harm to self or others: Suicidal ideation No plan to harm self or others patient endorses history of suicidal ideations however denies current suicidal ideations or intent  LIFE CONTEXT: Family and Social: Patient reports limited support in the community School/Work: Patient has pending disability claim Self-Care: Patient is open to initiating medication management.  He has initiated therapy services through [facility] Life Changes: Patient is experiencing psychosocial stressors and difficulty managing health conditions  GOALS ADDRESSED: Patient will: 1.  Reduce symptoms of: agitation, anxiety and depression  2.  Increase knowledge and/or ability of: self-management skills  3.  Demonstrate ability to: Increase healthy adjustment to current life circumstances and Increase adequate support systems for patient/family  INTERVENTIONS: Interventions utilized:  Solution-Focused Strategies, Supportive Counseling and Psychoeducation and/or Health Education Standardized Assessments completed: GAD-7 and PHQ 2&9 with C-SSRS  ASSESSMENT: Patient currently experiencing increase in depression anxiety symptoms triggered by psychosocial stressors.  Patient endorses increase in  irritability.   Patient may benefit from medication management and psychotherapy.  Patient states he has 2 appointments in July and August with [facility].  Strategies to assist with managing anger were discussed and patient is open to medication management through PCP.  PLAN: 1. Follow up with behavioral health clinician on : Contact LCSW with any additional behavioral health and/or resource needs 2. Behavioral recommendations: Continue therapy and medication management through [facility].  Utilize strategies discussed 3. Referral(s): [facility] (In Clinic) 4. "From scale of 1-10, how likely are you to follow plan?":   Rebekah Chesterfield, LCSW 08/03/2019 6:43 AM

## 2019-08-10 ENCOUNTER — Telehealth: Payer: Self-pay

## 2019-08-10 NOTE — Telephone Encounter (Signed)
Copied from [place] [phone]. Topic: General - Other >> Aug 04, 2019 10:59 AM Rainey Pines A wrote: Patient is requesting handicap sticker paperwork be filled out by Dr. Margarita Rana and patient would like a call once paperwork is complete.

## 2019-08-11 ENCOUNTER — Telehealth: Payer: Self-pay | Admitting: Family Medicine

## 2019-08-11 NOTE — Telephone Encounter (Signed)
Copied from [place] [phone]. Topic: Quick Sport and exercise psychologist Patient (Clinic Use ONLY) >> Aug 11, 2019 11:51 AM Gomez Cleverly, CMA wrote: Reason for CRM: call was [placed to patient to inform him that Dr.Newlin does not feel that he is handicap and will not be completing the form for him

## 2019-08-11 NOTE — Telephone Encounter (Signed)
Patient was called and informed to return phone call .

## 2019-08-11 NOTE — Telephone Encounter (Signed)
Patient returned call and was informed that Dr. Margarita Rana will not be filling out a handicap form.

## 2019-08-11 NOTE — Telephone Encounter (Signed)
Unfortunately I would not classify him as handicapped.

## 2019-08-11 NOTE — Telephone Encounter (Signed)
Does patient qualify? ?

## 2019-08-25 ENCOUNTER — Telehealth: Payer: Self-pay | Admitting: Family Medicine

## 2019-08-25 NOTE — Telephone Encounter (Signed)
Terry Poole of records faxed on 08/11/19 & no additional records available until pt returns for follow up appt. Per Terry Poole's request, placed physician medical condition form in provider's in-box for completion after pt next appt 08/31/19.

## 2019-08-25 NOTE — Telephone Encounter (Signed)
Kyung Rudd calling from [employer] is calling to check on the status of a disabilty claim form. Form was faxed on 08/22/19 Please advise Cb- [phone]

## 2019-08-26 ENCOUNTER — Other Ambulatory Visit: Payer: Self-pay

## 2019-08-26 ENCOUNTER — Encounter (HOSPITAL_COMMUNITY): Payer: Self-pay | Admitting: Pharmacy Technician

## 2019-08-26 ENCOUNTER — Emergency Department (HOSPITAL_COMMUNITY)
Admission: EM | Admit: 2019-08-26 | Discharge: 2019-08-26 | Disposition: A | Discharge status: Home / Self Care | Payer: Medicaid Other | Attending: Emergency Medicine | Admitting: Emergency Medicine

## 2019-08-26 ENCOUNTER — Emergency Department (HOSPITAL_COMMUNITY): Payer: Medicaid Other

## 2019-08-26 DIAGNOSIS — M25511 Pain in right shoulder: Secondary | ICD-10-CM | POA: Diagnosis present

## 2019-08-26 DIAGNOSIS — Z87891 Personal history of nicotine dependence: Secondary | ICD-10-CM | POA: Insufficient documentation

## 2019-08-26 DIAGNOSIS — E119 Type 2 diabetes mellitus without complications: Secondary | ICD-10-CM | POA: Insufficient documentation

## 2019-08-26 DIAGNOSIS — Z79899 Other long term (current) drug therapy: Secondary | ICD-10-CM | POA: Insufficient documentation

## 2019-08-26 DIAGNOSIS — I10 Essential (primary) hypertension: Secondary | ICD-10-CM | POA: Insufficient documentation

## 2019-08-26 MED ORDER — TRAMADOL HCL 50 MG PO TABS: 50.0000 mg | ORAL_TABLET | Freq: Four times a day (QID) | ORAL | 0 refills | Status: DC | PRN

## 2019-08-26 MED ORDER — OXYCODONE HCL 5 MG PO TABS
5.0000 mg | ORAL_TABLET | Freq: Once | ORAL | Status: AC
  Administered 2019-08-26: 5 mg via ORAL
  Filled 2019-08-26: qty 1

## 2019-08-26 MED FILL — traMADol HCL 50 MG TABS: 50 | 2 days supply | Qty: 10 | Fill #0

## 2019-08-26 NOTE — ED Provider Notes (Signed)
[facility] EMERGENCY DEPARTMENT Provider Note   CSN: [id number] Arrival date & time: 08/26/19  2585     History Chief Complaint  Patient presents with  . Shoulder Pain    Terry Poole is a 54 y.o. male.  Patient with medical history of type 2 diabetes mellitus, hyperlipidemia, hypertension, gout, RLE DVT (09/2017, not on anticoagulation due to provoked DVT, GI bleed) and R knee TKA -- presents with acute shoulder pain. He reports he woke up yesterday morning with acute onset right shoulder pain. He describes it as soreness throughout his shoulder that 'shoots' down his arm to his mid forearm. It is worse with movement and palpation.  Denies chest pain or shortness of breath, diaphoresis, vomiting, or exertional symptoms.  He denies neck pain, weakness, numbness, tingling, skin changes, or swelling in his right arm. He denies frequent repetitive motions with his upper extremities and denies any recent fall or trauma to his arm or shoulder. He has seen an orthopedist in the past for his R knee replacement (late 2020) but has not had his shoulder evaluated. He has no other acute complaints. Of note, he has a history of AKI secondary to increased NSAID intake in 09/2017.        Past Medical History:  Diagnosis Date  . Bronchitis   . Bronchitis   . DVT (deep venous thrombosis) ([place])   . Gout   . Hammertoe of second toe of right foot   . Smoker     Patient Active Problem List   Diagnosis Date Noted  . Total knee replacement status, right 04/14/2019  . Osteoarthritis of right knee 12/10/2017  . DVT (deep venous thrombosis) ([place]) 10/13/2017  . Pulmonary embolism ([place]) 10/02/2017  . AKI (acute kidney injury) ([place]) 09/22/2017  . Chest pain 09/22/2017  . Hypertension 11/19/2016  . Type 2 diabetes mellitus ([place]) 08/19/2016  . Hyperlipidemia 08/19/2016  . Back pain 05/13/2016  . Hammertoe of second toe of right foot 03/06/2016  . Onychomycosis 04/16/2015  .  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 Gout 03/28/2015    Past Surgical History:  Procedure Laterality Date  . HAMMER TOE SURGERY Right 03/10/2016   Procedure: 2ND RIGHT HAMMER TOE CORRECTION;  Surgeon: Landis Martins, DPM;  Location: [place];  Service: Podiatry;  Laterality: Right;  . MASS EXCISION Right 03/10/2016   Procedure: EXCISION OF CALLUS 2ND RIGHT TOE;  Surgeon: Landis Martins, DPM;  Location: [place];  Service: Podiatry;  Laterality: Right;  . NO PAST SURGERIES         Family History  Problem Relation Age of Onset  . Diabetes Mother   . Healthy Father   . Heart disease Neg Hx   . Kidney disease Neg Hx     Social History   Tobacco Use  . Smoking status: Former Smoker    Packs/day: 0.25    Types: Cigarettes    Quit date: 01/25/2019    Years since quitting: 0.5  . Smokeless tobacco: Never Used  . Tobacco comment: 8 cigs daily  Vaping Use  . Vaping Use: Never used  Substance Use Topics  . Alcohol use: No  . Drug use: No    Home Medications Prior to Admission medications   Medication Sig Start Date End Date Taking? Authorizing Provider  acetaminophen (TYLENOL) 500 MG tablet Take by mouth. 03/23/18   [provider]  allopurinol (ZYLOPRIM) 300 MG tablet Take by mouth. 06/30/18   [provider]  allopurinol (  ZYLOPRIM) 300 MG tablet Take 1 tablet (300 mg total) by mouth daily. 07/25/19   [name], MD  atorvastatin (LIPITOR) 40 MG tablet Take by mouth. 07/27/18   [provider]  atorvastatin (LIPITOR) 40 MG tablet Take 1 tablet (40 mg total) by mouth daily. 07/25/19   [name], MD  cyclobenzaprine (FLEXERIL) 5 MG tablet TAKE 1 TABLET (5 MG TOTAL) BY MOUTH 2 (TWO) TIMES DAILY AS NEEDED FOR MUSCLE SPASMS. 02/03/19   [name], MD  FLUoxetine (PROZAC) 20 MG tablet Take 1 tablet (20 mg total) by mouth daily. 07/25/19   [name], MD  fluticasone (FLONASE) 50 MCG/ACT nasal spray Place 2  sprays into both nostrils daily as needed for allergies. 01/13/19   Ladell Pier, MD  furosemide (LASIX) 20 MG tablet Take 1 tablet (20 mg total) by mouth daily. 07/27/18   [name], MD  iron polysaccharides (NIFEREX) 150 MG capsule Take by mouth. 01/08/19   [provider]  lisinopril (ZESTRIL) 10 MG tablet Take 1 tablet (10 mg total) by mouth daily. 07/25/19   [name], MD  metFORMIN (GLUCOPHAGE) 500 MG tablet Take 1 tablet (500 mg total) by mouth 2 (two) times daily with a meal. 07/25/19   Newlin, Charlane Ferretti, MD  methocarbamol (ROBAXIN) 500 MG tablet TAKE 1 TABLET (500 MG TOTAL) BY MOUTH 2 (TWO) TIMES DAILY. 07/22/19   Newlin, Charlane Ferretti, MD  MITIGARE 0.6 MG CAPS TAKE 1 CAPSULE (0.6 MG) BY MOUTH AT THE ONSET OF A GOUT ATTACK, MAY REPEAT 1 CAPSULE IN 2 HOURS IF SYMPTOMS PERSIST 05/25/19   [name], MD  pantoprazole (PROTONIX) 40 MG tablet Take by mouth. 03/16/18   [provider]  pantoprazole (PROTONIX) 40 MG tablet Take 1 tablet (40 mg total) by mouth 2 (two) times daily. 07/25/19   [name], MD  tiZANidine (ZANAFLEX) 4 MG tablet Take 1 tablet (4 mg total) by mouth every 8 (eight) hours as needed for muscle spasms. 03/30/19   Wieters, Hallie C, PA-C  topiramate (TOPAMAX) 50 MG tablet Take 1 tablet (50 mg total) by mouth 2 (two) times daily. 07/25/19   [name], MD  traMADol (ULTRAM) 50 MG tablet Take 1 tablet (50 mg total) by mouth every 6 (six) hours as needed. 08/26/19   Carlisle Cater, PA-C  VIAGRA 50 MG tablet TAKE 1 TABLET BY MOUTH DAILY AS NEEDED FOR ERECTILE DYSFUNCTION. AT LEAST 24 HOURS BETWEEN DOSES Patient not taking: No sig reported 08/05/18 08/26/19  [name], MD    Allergies    Vicodin [hydrocodone-acetaminophen]  Review of Systems   Review of Systems  Constitutional: Negative for fever.  Musculoskeletal: Positive for arthralgias. Negative for back pain, gait problem, joint swelling and neck pain.  Skin: Negative for wound.    Neurological: Negative for weakness and numbness.    Physical Exam Updated Vital Signs BP (!) 139/89   Pulse 72   Temp 98.4 F (36.9 C) (Oral)   Resp 16   Ht 5\' 9"  (1.753 m)   Wt (!) 104.3 kg   SpO2 100%   BMI 33.97 kg/m   Physical Exam Vitals and nursing note reviewed.  Constitutional:      Appearance: He is well-developed.  HENT:     Head: Normocephalic and atraumatic.  Eyes:     Conjunctiva/sclera: Conjunctivae normal.  Cardiovascular:     Pulses: Normal pulses. No decreased pulses.          Radial pulses are 2+ on the right side and 2+  on the left side.  Musculoskeletal:        General: Tenderness present.     Right shoulder: Tenderness present. No bony tenderness. Decreased range of motion.     Right upper arm: No tenderness.     Right elbow: Normal range of motion.     Cervical back: Normal range of motion and neck supple. No spasms or tenderness. Normal range of motion.     Comments: Patient with tenderness to palpation over the right shoulder worse posterior and anteriorly.  Compartments of the forearm are soft.  Upper arm is soft.  Distal pulses and sensation intact.  Skin:    General: Skin is warm and dry.  Neurological:     Mental Status: He is alert.     Sensory: No sensory deficit.     Comments: Motor, sensation, and vascular distal to the injury is fully intact.      ED Results / Procedures / Treatments   Labs (all labs ordered are listed, but only abnormal results are displayed) Labs Reviewed - No data to display  EKG None  Radiology DG Shoulder Right  Result Date: 08/26/2019 CLINICAL DATA:  54 year old male with acute RIGHT shoulder pain for 1 day. No known injury. Initial encounter. EXAM: RIGHT SHOULDER - 2+ VIEW COMPARISON:  None. FINDINGS: No fracture, subluxation or dislocation identified. Minimal glenohumeral joint space narrowing identified. No focal bony lesions are present. Reactive changes at the rotator cuff insertion noted.  IMPRESSION: 1. No acute abnormality. 2. Minimal glenohumeral joint space narrowing. Electronically Signed   By: Margarette Canada M.D.   On: 08/26/2019 08:57    Procedures Procedures (including critical care time)  Medications Ordered in ED Medications  oxyCODONE (Oxy IR/ROXICODONE) immediate release tablet 5 mg (has no administration in time range)    ED Course  I have reviewed the triage vital signs and the nursing notes.  Pertinent labs & imaging results that were available during my care of the patient were reviewed by me and considered in my medical decision making (see chart for details).  Patient seen and examined. Informed of x-ray results.    Vital signs reviewed and are as follows: BP (!) 139/89   Pulse 72   Temp 98.4 F (36.9 C) (Oral)   Resp 16   Ht 5\' 9"  (1.753 m)   Wt (!) 104.3 kg   SpO2 100%   BMI 33.97 kg/m   Patient will be provided with a sling.  He has a history of previous GI bleed and NSAID induced nephropathy.  Given this, will avoid NSAIDs.  He also has multiple cardiovascular risk factors including diabetes.  Patient has been on tramadol in the past.  Will give short course.  No seizure history.  Patient does have history of lower extremity DVT.  He does not have any upper extremity swelling today.  He has normal distal pulses and sensation.  I do not suspect an acute arterial or venous occlusion.  Pain begins in the shoulder and radiates down into the arm.  Suspect radiculopathy or shoulder impingement.  Patient courage to follow-up with PCP.  Will provide sling for comfort.   No features concerning for ACS. Pain is very much related to movement and palpation of the shoulder.   Patient counseled on use of narcotic pain medications. Counseled not to combine these medications with others containing tylenol. Urged not to drink alcohol, drive, or perform any other activities that requires focus while taking these medications. The patient verbalizes understanding  and  agrees with the plan.      MDM Rules/Calculators/A&P                           Final Clinical Impression(s) / ED Diagnoses Final diagnoses:  Acute pain of right shoulder    Rx / DC Orders ED Discharge Orders         Ordered    traMADol (ULTRAM) 50 MG tablet  Every 6 hours PRN     Discontinue  Reprint     08/26/19 1037           Carlisle Cater, PA-C 08/26/19 1052    Blanchie Dessert, MD 08/26/19 1135

## 2019-08-26 NOTE — Discharge Instructions (Signed)
Please read and follow all provided instructions.  Your diagnoses today include:  1. Acute pain of right shoulder     Tests performed today include:  An x-ray of the affected area - does NOT show any broken bones  Vital signs. See below for your results today.   Medications prescribed:   Tramadol - narcotic-like pain medication  DO NOT drive or perform any activities that require you to be awake and alert because this medicine can make you drowsy.   Take any prescribed medications only as directed.  Home care instructions:   Follow any educational materials contained in this packet  Follow R.I.C.E. Protocol:  R - rest your injury   I  - use ice on injury without applying directly to skin  C - compress injury with bandage or splint  E - elevate the injury as much as possible  Follow-up instructions: Please follow-up with your primary care provider if you continue to have significant pain in 1 week. In this case you may have a more severe injury that requires further care.   Return instructions:   Please return if your fingers are numb or tingling, appear gray or blue, or you have severe pain (also elevate the arm and loosen splint or wrap if you were given one)  Please return to the Emergency Department if you experience worsening symptoms.   Please return if you have any other emergent concerns.  Additional Information:  Your vital signs today were: BP (!) 139/89   Pulse 72   Temp 98.4 F (36.9 C) (Oral)   Resp 16   Ht 5\' 9"  (1.753 m)   Wt (!) 104.3 kg   SpO2 100%   BMI 33.97 kg/m  If your blood pressure (BP) was elevated above 135/85 this visit, please have this repeated by your doctor within one month. --------------

## 2019-08-26 NOTE — ED Triage Notes (Signed)
Pt presents with R shoulder pain since yesterday. Denies injury. CNS intact.

## 2019-08-31 ENCOUNTER — Encounter: Payer: Self-pay | Admitting: Family Medicine

## 2019-08-31 ENCOUNTER — Ambulatory Visit: Payer: No Typology Code available for payment source | Attending: Family Medicine | Admitting: Family Medicine

## 2019-08-31 ENCOUNTER — Other Ambulatory Visit: Payer: Self-pay

## 2019-08-31 DIAGNOSIS — M1711 Unilateral primary osteoarthritis, right knee: Secondary | ICD-10-CM | POA: Diagnosis not present

## 2019-08-31 DIAGNOSIS — G8929 Other chronic pain: Secondary | ICD-10-CM

## 2019-08-31 DIAGNOSIS — M545 Low back pain, unspecified: Secondary | ICD-10-CM

## 2019-08-31 DIAGNOSIS — I82401 Acute embolism and thrombosis of unspecified deep veins of right lower extremity: Secondary | ICD-10-CM

## 2019-08-31 NOTE — Progress Notes (Signed)
Patient is needing disability paperwork filled out.

## 2019-08-31 NOTE — Progress Notes (Signed)
Virtual Visit via Telephone Note  I connected with Terry Poole, on 08/31/2019 at 10:44 AM by telephone due to the COVID-19 pandemic and verified that I am speaking with the correct person using two identifiers.   Consent: I discussed the limitations, risks, security and privacy concerns of performing an evaluation and management service by telephone and the availability of in person appointments. I also discussed with the patient that there may be a patient responsible charge related to this service. The patient expressed understanding and agreed to proceed.   Location of Patient: Home  Location of Provider: Clinic   Persons participating in Telemedicine visit: [name] Dr. Margarita Rana     History of Present Illness: Terry Poole is a 54 year old male with a history of type 2 diabetes mellitus (A1c7.4) Hyperlipidemia, hypertension, gout, bilateral PE and right lower extremity DVT (diagnosed in 09/2017), s/p R TKR, depressionwho presents todayfor completion of disability form. He is seeking disability based on his depression, his right knee osteoarthritis status post replacement and chronic low back pain. He also has chronic right lower extremity edema ever since his chronic right lower extremity DVT and symptoms worsen with prolonged standing or prolonged sitting.   Past Medical History:  Diagnosis Date  . Bronchitis   . Bronchitis   . DVT (deep venous thrombosis) ([place])   . Gout   . Hammertoe of second toe of right foot   . Smoker    Allergies  Allergen Reactions  . Vicodin [Hydrocodone-Acetaminophen] Hives    Patient stated he is no longer allergic to vicodin, patient stated that he was allergic about 12 years ago.     Current Outpatient Medications on File Prior to Visit  Medication Sig Dispense Refill  . acetaminophen (TYLENOL) 500 MG tablet Take by mouth.    Marland Kitchen allopurinol (ZYLOPRIM) 300 MG tablet Take by mouth.    Marland Kitchen allopurinol (ZYLOPRIM) 300  MG tablet Take 1 tablet (300 mg total) by mouth daily. 30 tablet 3  . atorvastatin (LIPITOR) 40 MG tablet Take by mouth.    Marland Kitchen atorvastatin (LIPITOR) 40 MG tablet Take 1 tablet (40 mg total) by mouth daily. 30 tablet 3  . cyclobenzaprine (FLEXERIL) 5 MG tablet TAKE 1 TABLET (5 MG TOTAL) BY MOUTH 2 (TWO) TIMES DAILY AS NEEDED FOR MUSCLE SPASMS. 30 tablet 0  . FLUoxetine (PROZAC) 20 MG tablet Take 1 tablet (20 mg total) by mouth daily. 30 tablet 3  . fluticasone (FLONASE) 50 MCG/ACT nasal spray Place 2 sprays into both nostrils daily as needed for allergies. 9.9 mL 2  . furosemide (LASIX) 20 MG tablet Take 1 tablet (20 mg total) by mouth daily. 5 tablet 0  . iron polysaccharides (NIFEREX) 150 MG capsule Take by mouth.    Marland Kitchen lisinopril (ZESTRIL) 10 MG tablet Take 1 tablet (10 mg total) by mouth daily. 30 tablet 3  . metFORMIN (GLUCOPHAGE) 500 MG tablet Take 1 tablet (500 mg total) by mouth 2 (two) times daily with a meal. 60 tablet 3  . methocarbamol (ROBAXIN) 500 MG tablet TAKE 1 TABLET (500 MG TOTAL) BY MOUTH 2 (TWO) TIMES DAILY. 20 tablet 0  . MITIGARE 0.6 MG CAPS TAKE 1 CAPSULE (0.6 MG) BY MOUTH AT THE ONSET OF A GOUT ATTACK, MAY REPEAT 1 CAPSULE IN 2 HOURS IF SYMPTOMS PERSIST 30 capsule 1  . pantoprazole (PROTONIX) 40 MG tablet Take by mouth.    . pantoprazole (PROTONIX) 40 MG tablet Take 1 tablet (40 mg total) by mouth 2 (two)  times daily. 60 tablet 3  . tiZANidine (ZANAFLEX) 4 MG tablet Take 1 tablet (4 mg total) by mouth every 8 (eight) hours as needed for muscle spasms. 90 tablet 0  . topiramate (TOPAMAX) 50 MG tablet Take 1 tablet (50 mg total) by mouth 2 (two) times daily. 60 tablet 3  . traMADol (ULTRAM) 50 MG tablet Take 1 tablet (50 mg total) by mouth every 6 (six) hours as needed. 10 tablet 0  . [DISCONTINUED] VIAGRA 50 MG tablet TAKE 1 TABLET BY MOUTH DAILY AS NEEDED FOR ERECTILE DYSFUNCTION. AT LEAST 24 HOURS BETWEEN DOSES (Patient not taking: No sig reported) 10 tablet 2   No current  facility-administered medications on file prior to visit.    Observations/Objective: Awake, alert, oriented x3 Not in acute distress  Lab Results  Component Value Date   HGBA1C 7.4 (A) 06/20/2019    Assessment and Plan: 1. Chronic right-sided low back pain without sciatica Stable  2. Deep vein thrombosis (DVT) of right lower extremity, unspecified chronicity, unspecified vein (HCC) Currently with recurrent right lower extremity edema Anticoagulation discontinued by hematology Unable to stand or sit for prolonged periods due to above  3. Osteoarthritis of right knee, unspecified osteoarthritis type S/p replacement See #2 above   Follow Up Instructions: Keep previously scheduled appointment   I discussed the assessment and treatment plan with the patient. The patient was provided an opportunity to ask questions and all were answered. The patient agreed with the plan and demonstrated an understanding of the instructions.   The patient was advised to call back or seek an in-person evaluation if the symptoms worsen or if the condition fails to improve as anticipated.     I provided 14 minutes total of non-face-to-face time during this encounter including median intraservice time, reviewing previous notes, investigations, ordering medications, medical decision making, coordinating care and patient verbalized understanding at the end of the visit.     [name], MD, FAAFP. [facility] [place], [place]   08/31/2019, 10:44 AM

## 2019-09-01 DIAGNOSIS — Z0289 Encounter for other administrative examinations: Secondary | ICD-10-CM

## 2019-09-08 ENCOUNTER — Telehealth: Payer: Self-pay | Admitting: Family Medicine

## 2019-09-08 NOTE — Telephone Encounter (Signed)
Copied from [place] [phone]. Topic: General - Other >> Sep 01, 2019  4:17 PM Leward Quan A wrote: Reason for CRM: [employer] called to check status of forms faxed over on 08/04/19 and again on 08/25/19  Ref# [id number] Ph# [phone] >> Sep 06, 2019 12:23 PM Percell Belt A wrote: [employer] called again to check the status of the Disability paperwork  Fx number [phone]

## 2019-09-08 NOTE — Telephone Encounter (Signed)
Forms has been faxed over to number provided.  Patient has been informed that paperwork was faxed.

## 2019-09-09 ENCOUNTER — Other Ambulatory Visit: Payer: Self-pay | Admitting: Family Medicine

## 2019-09-09 DIAGNOSIS — N528 Other male erectile dysfunction: Secondary | ICD-10-CM

## 2019-09-09 MED FILL — METHOCARBAMOL 500 MG TABS: 500 | 10 days supply | Qty: 20 | Fill #0

## 2019-09-09 MED FILL — LISINOPRIL 10 MG TABS: 10 | 30 days supply | Qty: 30 | Fill #1

## 2019-09-09 MED FILL — ATORVASTATIN CALCIUM 40 MG: 40 | 30 days supply | Qty: 30 | Fill #1

## 2019-09-09 MED FILL — ALLOPURINOL 300 MG TAB: 300 | 30 days supply | Qty: 30 | Fill #1

## 2019-10-05 ENCOUNTER — Other Ambulatory Visit: Payer: Self-pay | Admitting: Family Medicine

## 2019-10-05 DIAGNOSIS — N528 Other male erectile dysfunction: Secondary | ICD-10-CM

## 2019-10-05 MED FILL — ALLOPURINOL 300 MG TAB: 300 | 30 days supply | Qty: 30 | Fill #2

## 2019-10-05 MED FILL — METFORMIN HCL 500 MG TABS: 500 | 30 days supply | Qty: 60 | Fill #1

## 2019-10-05 MED FILL — FLUoxetine HCL 20 MG TABS: 20 | 30 days supply | Qty: 30 | Fill #1

## 2019-10-05 MED FILL — PANTOPRAZOLE SOD DR 40 MG T: 40 | 30 days supply | Qty: 60 | Fill #1

## 2019-10-05 MED FILL — ATORVASTATIN CALCIUM 40 MG: 40 | 30 days supply | Qty: 30 | Fill #2

## 2019-10-05 MED FILL — LISINOPRIL 10 MG TABS: 10 | 30 days supply | Qty: 30 | Fill #2

## 2019-10-05 MED FILL — TOPIRAMATE 50 MG TABLET: 50 | 30 days supply | Qty: 60 | Fill #1

## 2019-10-25 ENCOUNTER — Other Ambulatory Visit: Payer: Self-pay | Admitting: Family Medicine

## 2019-10-25 ENCOUNTER — Ambulatory Visit: Payer: No Typology Code available for payment source | Attending: Family Medicine | Admitting: Family Medicine

## 2019-10-25 ENCOUNTER — Other Ambulatory Visit: Payer: Self-pay

## 2019-10-25 ENCOUNTER — Encounter: Payer: Self-pay | Admitting: Family Medicine

## 2019-10-25 VITALS — BP 122/72 | HR 81 | Ht 69.0 in | Wt 238.0 lb

## 2019-10-25 DIAGNOSIS — M1A0720 Idiopathic chronic gout, left ankle and foot, without tophus (tophi): Secondary | ICD-10-CM | POA: Diagnosis not present

## 2019-10-25 DIAGNOSIS — G44209 Tension-type headache, unspecified, not intractable: Secondary | ICD-10-CM | POA: Diagnosis not present

## 2019-10-25 DIAGNOSIS — Z23 Encounter for immunization: Secondary | ICD-10-CM | POA: Diagnosis not present

## 2019-10-25 DIAGNOSIS — E1169 Type 2 diabetes mellitus with other specified complication: Secondary | ICD-10-CM

## 2019-10-25 DIAGNOSIS — I10 Essential (primary) hypertension: Secondary | ICD-10-CM

## 2019-10-25 DIAGNOSIS — F3289 Other specified depressive episodes: Secondary | ICD-10-CM

## 2019-10-25 LAB — POCT GLYCOSYLATED HEMOGLOBIN (HGB A1C): HbA1c, POC (controlled diabetic range): 7.7 % — AB (ref 0.0–7.0)

## 2019-10-25 LAB — GLUCOSE, POCT (MANUAL RESULT ENTRY): POC Glucose: 126 mg/dl — AB (ref 70–99)

## 2019-10-25 MED ORDER — ALLOPURINOL 300 MG PO TABS: 300.0000 mg | ORAL_TABLET | Freq: Every day | ORAL | 6 refills | Status: DC

## 2019-10-25 MED ORDER — ATORVASTATIN CALCIUM 40 MG PO TABS: 40.0000 mg | ORAL_TABLET | Freq: Every day | ORAL | 6 refills | Status: DC

## 2019-10-25 MED ORDER — PANTOPRAZOLE SODIUM 40 MG PO TBEC: 40.0000 mg | DELAYED_RELEASE_TABLET | Freq: Two times a day (BID) | ORAL | 6 refills | Status: DC

## 2019-10-25 MED ORDER — TOPIRAMATE 100 MG PO TABS: 100.0000 mg | ORAL_TABLET | Freq: Two times a day (BID) | ORAL | 6 refills | Status: DC

## 2019-10-25 MED ORDER — LISINOPRIL 10 MG PO TABS: 10.0000 mg | ORAL_TABLET | Freq: Every day | ORAL | 6 refills | Status: DC

## 2019-10-25 MED ORDER — COLCHICINE 0.6 MG PO CAPS: ORAL_CAPSULE | ORAL | 1 refills | Status: DC

## 2019-10-25 MED ORDER — FLUOXETINE HCL 20 MG PO TABS: 20.0000 mg | ORAL_TABLET | Freq: Every day | ORAL | 6 refills | Status: DC

## 2019-10-25 MED ORDER — METFORMIN HCL 500 MG PO TABS: 1000.0000 mg | ORAL_TABLET | Freq: Two times a day (BID) | ORAL | 6 refills | Status: DC

## 2019-10-25 MED FILL — MITIGARE 0.6 MG CAPS: 0.6 | 15 days supply | Qty: 30 | Fill #0

## 2019-10-25 NOTE — Progress Notes (Signed)
Has had covid vaccine does not have card with him.

## 2019-10-25 NOTE — Progress Notes (Signed)
Subjective:  Patient ID: Terry Poole, male    DOB: 1965/05/18  Age: 54 y.o. MRN: [id number]  CC: Diabetes   HPI Terry Poole a 54 year old male with a history of type 2 diabetes mellitus (A1c7.7) Hyperlipidemia, hypertension, gout, bilateral PE and right lower extremity DVT (diagnosed in 09/2017, completed course of anticoagulation.), s/p R TKR, depression  He has chronic headaches and feels like the room is spinning and he sometimes gets lightheaded.  Topamax was initiated at his last office visit which he reports has brought about some improvement in headaches but headache is still present and occurs daily.  Denies presence of nausea, vomiting, blurry vision.  Not taking Metformin twice daily but rather taking daily.  Not checking his sugars regularly and denies hypoglycemia, numbness in extremities, visual concerns. Compliant with his antihypertensive and medication for gout and denies recent gout flares. Tolerating his antihypertensive. Past Medical History:  Diagnosis Date  . Bronchitis   . Bronchitis   . DVT (deep venous thrombosis) ([place])   . Gout   . Hammertoe of second toe of right foot   . Smoker     Past Surgical History:  Procedure Laterality Date  . HAMMER TOE SURGERY Right 03/10/2016   Procedure: 2ND RIGHT HAMMER TOE CORRECTION;  Surgeon: Landis Martins, DPM;  Location: [facility];  Service: Podiatry;  Laterality: Right;  . MASS EXCISION Right 03/10/2016   Procedure: EXCISION OF CALLUS 2ND RIGHT TOE;  Surgeon: Landis Martins, DPM;  Location: [facility];  Service: Podiatry;  Laterality: Right;  . NO PAST SURGERIES      Family History  Problem Relation Age of Onset  . Diabetes Mother   . Healthy Father   . Heart disease Neg Hx   . Kidney disease Neg Hx     Allergies  Allergen Reactions  . Vicodin [Hydrocodone-Acetaminophen] Hives    Patient stated he is no longer allergic to vicodin, patient stated that he was  allergic about 12 years ago.     Outpatient Medications Prior to Visit  Medication Sig Dispense Refill  . acetaminophen (TYLENOL) 500 MG tablet Take by mouth.    Marland Kitchen allopurinol (ZYLOPRIM) 300 MG tablet Take by mouth.    Marland Kitchen allopurinol (ZYLOPRIM) 300 MG tablet Take 1 tablet (300 mg total) by mouth daily. 30 tablet 3  . atorvastatin (LIPITOR) 40 MG tablet Take by mouth.    Marland Kitchen atorvastatin (LIPITOR) 40 MG tablet Take 1 tablet (40 mg total) by mouth daily. 30 tablet 3  . cyclobenzaprine (FLEXERIL) 5 MG tablet TAKE 1 TABLET (5 MG TOTAL) BY MOUTH 2 (TWO) TIMES DAILY AS NEEDED FOR MUSCLE SPASMS. 30 tablet 0  . FLUoxetine (PROZAC) 20 MG tablet Take 1 tablet (20 mg total) by mouth daily. 30 tablet 3  . fluticasone (FLONASE) 50 MCG/ACT nasal spray Place 2 sprays into both nostrils daily as needed for allergies. 9.9 mL 2  . furosemide (LASIX) 20 MG tablet Take 1 tablet (20 mg total) by mouth daily. 5 tablet 0  . iron polysaccharides (NIFEREX) 150 MG capsule Take by mouth.    Marland Kitchen lisinopril (ZESTRIL) 10 MG tablet Take 1 tablet (10 mg total) by mouth daily. 30 tablet 3  . metFORMIN (GLUCOPHAGE) 500 MG tablet Take 1 tablet (500 mg total) by mouth 2 (two) times daily with a meal. 60 tablet 3  . methocarbamol (ROBAXIN) 500 MG tablet TAKE 1 TABLET (500 MG TOTAL) BY MOUTH 2 (TWO) TIMES DAILY. 20 tablet 0  .  MITIGARE 0.6 MG CAPS TAKE 1 CAPSULE (0.6 MG) BY MOUTH AT THE ONSET OF A GOUT ATTACK, MAY REPEAT 1 CAPSULE IN 2 HOURS IF SYMPTOMS PERSIST 30 capsule 1  . pantoprazole (PROTONIX) 40 MG tablet Take by mouth.    . pantoprazole (PROTONIX) 40 MG tablet Take 1 tablet (40 mg total) by mouth 2 (two) times daily. 60 tablet 3  . tiZANidine (ZANAFLEX) 4 MG tablet Take 1 tablet (4 mg total) by mouth every 8 (eight) hours as needed for muscle spasms. 90 tablet 0  . topiramate (TOPAMAX) 50 MG tablet Take 1 tablet (50 mg total) by mouth 2 (two) times daily. 60 tablet 3  . traMADol (ULTRAM) 50 MG tablet Take 1 tablet (50 mg  total) by mouth every 6 (six) hours as needed. 10 tablet 0   No facility-administered medications prior to visit.     ROS Review of Systems  Constitutional: Negative for activity change and appetite change.  HENT: Negative for sinus pressure and sore throat.   Eyes: Negative for visual disturbance.  Respiratory: Negative for cough, chest tightness and shortness of breath.   Cardiovascular: Negative for chest pain and leg swelling.  Gastrointestinal: Negative for abdominal distention, abdominal pain, constipation and diarrhea.  Endocrine: Negative.   Genitourinary: Negative for dysuria.  Musculoskeletal: Negative for joint swelling and myalgias.  Skin: Negative for rash.  Allergic/Immunologic: Negative.   Neurological: Positive for headaches. Negative for weakness, light-headedness and numbness.  Psychiatric/Behavioral: Positive for sleep disturbance. Negative for dysphoric mood and suicidal ideas.    Objective:  BP 122/72   Pulse 81   Ht 5\' 9"  (1.753 m)   Wt 238 lb (108 kg)   SpO2 99%   BMI 35.15 kg/m   BP/Weight 10/25/2019 08/26/2019 03/15/4707  Systolic BP 628 366 294  Diastolic BP 72 89 88  Wt. (Lbs) 238 230 240  BMI 35.15 33.97 35.44      Physical Exam Constitutional:      Appearance: He is well-developed.  Neck:     Vascular: No JVD.  Cardiovascular:     Rate and Rhythm: Normal rate.     Heart sounds: Normal heart sounds. No murmur heard.   Pulmonary:     Effort: Pulmonary effort is normal.     Breath sounds: Normal breath sounds. No wheezing or rales.  Chest:     Chest wall: No tenderness.  Abdominal:     General: Bowel sounds are normal. There is no distension.     Palpations: Abdomen is soft. There is no mass.     Tenderness: There is no abdominal tenderness.  Musculoskeletal:        General: Normal range of motion.     Right lower leg: No edema.     Left lower leg: No edema.  Neurological:     Mental Status: He is alert and oriented to person,  place, and time.  Psychiatric:        Mood and Affect: Mood normal.     CMP Latest Ref Rng & Units 04/16/2019 02/12/2019 10/21/2018  Glucose 70 - 99 mg/dL 186(H) 163(H) 102(H)  BUN 6 - 20 mg/dL 15 12 13   Creatinine 0.61 - 1.24 mg/dL 1.20 0.88 0.98  Sodium 135 - 145 mmol/L 141 138 139  Potassium 3.5 - 5.1 mmol/L 4.4 3.7 3.6  Chloride 98 - 111 mmol/L 109 106 107  CO2 22 - 32 mmol/L 19(L) 23 21(L)  Calcium 8.9 - 10.3 mg/dL 9.0 9.1 9.1  Total Protein 6.5 -  8.1 g/dL 7.1 7.1 -  Total Bilirubin 0.3 - 1.2 mg/dL 0.5 0.6 -  Alkaline Phos 38 - 126 U/L 100 132(H) -  AST 15 - 41 U/L 32 17 -  ALT 0 - 44 U/L 61(H) 29 -    Lipid Panel     Component Value Date/Time   CHOL 128 03/14/2019 1410   TRIG 330 (H) 03/14/2019 1410   HDL 44 03/14/2019 1410   CHOLHDL 2.9 03/14/2019 1410   CHOLHDL 2.6 09/21/2015 1053   VLDL 47 (H) 09/21/2015 1053   LDLCALC 36 03/14/2019 1410    CBC    Component Value Date/Time   WBC 7.0 04/16/2019 1414   RBC 4.73 04/16/2019 1414   HGB 12.9 (L) 04/16/2019 1414   HGB 13.0 03/18/2019 1410   HCT 42.0 04/16/2019 1414   HCT 39.8 03/18/2019 1410   PLT 272 04/16/2019 1414   PLT 300 03/18/2019 1410   MCV 88.8 04/16/2019 1414   MCV 85 03/18/2019 1410   MCH 27.3 04/16/2019 1414   MCHC 30.7 04/16/2019 1414   RDW 15.6 (H) 04/16/2019 1414   RDW 14.0 03/18/2019 1410   LYMPHSABS 3.6 (H) 03/18/2019 1410   MONOABS 0.3 09/22/2018 1156   EOSABS 0.2 03/18/2019 1410   BASOSABS 0.0 03/18/2019 1410    Lab Results  Component Value Date   HGBA1C 7.7 (A) 10/25/2019    Assessment & Plan:  1. Type 2 diabetes mellitus with other specified complication, without long-term current use of insulin (HCC) Not at goal with A1c of 7.7; goal is less than 7.0 Increased dose of Metformin Counseled on blood pressure goal of less than 130/80, low-sodium, DASH diet, medication compliance, 150 minutes of moderate intensity exercise per week. Discussed medication compliance, adverse effects. -  POCT glucose (manual entry) - POCT glycosylated hemoglobin (Hb A1C) - metFORMIN (GLUCOPHAGE) 500 MG tablet; Take 2 tablets (1,000 mg total) by mouth 2 (two) times daily with a meal.  Dispense: 120 tablet; Refill: 6 - Basic Metabolic Panel - atorvastatin (LIPITOR) 40 MG tablet; Take 1 tablet (40 mg total) by mouth daily.  Dispense: 30 tablet; Refill: 6  2. Tension headache Uncontrolled Increase Topamax dose He attributes this to previous brain injury several years ago - topiramate (TOPAMAX) 100 MG tablet; Take 1 tablet (100 mg total) by mouth 2 (two) times daily.  Dispense: 60 tablet; Refill: 6  3. Chronic idiopathic gout involving toe of left foot without tophus Stable - Colchicine (MITIGARE) 0.6 MG CAPS; TAKE 1 CAPSULE (0.6 MG) BY MOUTH AT THE ONSET OF A GOUT ATTACK, MAY REPEAT 1 CAPSULE IN 2 HOURS IF SYMPTOMS PERSIST  Dispense: 30 capsule; Refill: 1 - allopurinol (ZYLOPRIM) 300 MG tablet; Take 1 tablet (300 mg total) by mouth daily.  Dispense: 30 tablet; Refill: 6  4. Essential hypertension Controlled Counseled on blood pressure goal of less than 130/80, low-sodium, DASH diet, medication compliance, 150 minutes of moderate intensity exercise per week. Discussed medication compliance, adverse effects. - lisinopril (ZESTRIL) 10 MG tablet; Take 1 tablet (10 mg total) by mouth daily.  Dispense: 30 tablet; Refill: 6  5. Other depression Stable Undergoing psychotherapy - FLUoxetine (PROZAC) 20 MG tablet; Take 1 tablet (20 mg total) by mouth daily.  Dispense: 30 tablet; Refill: 6  6. Need for immunization against influenza - Flu Vaccine QUAD 36+ mos IM   No orders of the defined types were placed in this encounter.   Return in about 6 months (around 04/23/2020) for Chronic disease management.  [name], MD, FAAFP. [facility] and [place], [employer]   10/25/2019, 3:44 PM

## 2019-10-26 ENCOUNTER — Encounter: Payer: Self-pay | Admitting: *Deleted

## 2019-10-26 LAB — BASIC METABOLIC PANEL
BUN/Creatinine Ratio: 11 (ref 9–20)
BUN: 11 mg/dL (ref 6–24)
CO2: 20 mmol/L (ref 20–29)
Calcium: 9.3 mg/dL (ref 8.7–10.2)
Chloride: 108 mmol/L — ABNORMAL HIGH (ref 96–106)
Creatinine, Ser: 1.01 mg/dL (ref 0.76–1.27)
GFR calc Af Amer: 97 mL/min/{1.73_m2} (ref >59)
GFR calc non Af Amer: 84 mL/min/{1.73_m2} (ref >59)
Glucose: 115 mg/dL — ABNORMAL HIGH (ref 65–99)
Potassium: 4 mmol/L (ref 3.5–5.2)
Sodium: 139 mmol/L (ref 134–144)

## 2019-10-26 MED FILL — FLUoxetine HCL 20 MG TABS: 20 | 30 days supply | Qty: 30 | Fill #0

## 2019-10-26 MED FILL — TOPIRAMATE 100 MG TABS: 100 | 30 days supply | Qty: 60 | Fill #0

## 2019-10-28 ENCOUNTER — Telehealth: Payer: Self-pay

## 2019-10-28 NOTE — Telephone Encounter (Signed)
Will route to PCP for review. 

## 2019-10-28 NOTE — Telephone Encounter (Signed)
Patient called in stating that he think he has a sinus infection and forgot to state and tell the Dr on his last visit 10/25/2019. Wanting to know if Dr could send in him a antibotics and also refill his muscle relaxer as well.    Please call and advise

## 2019-10-31 ENCOUNTER — Telehealth: Payer: Self-pay | Admitting: Family Medicine

## 2019-10-31 MED ORDER — TIZANIDINE HCL 4 MG PO TABS: 4.0000 mg | ORAL_TABLET | Freq: Three times a day (TID) | ORAL | 0 refills | Status: DC | PRN

## 2019-10-31 MED FILL — tiZANidine HCL 4 MG TABS: 4 | 30 days supply | Qty: 90 | Fill #0

## 2019-10-31 NOTE — Telephone Encounter (Signed)
I have refilled his muscle relaxant.  No antibiotic is indicated at this time for his sinuses but rather would recommend he use Mucinex, nasal irrigation and Tylenol for his symptoms.

## 2019-10-31 NOTE — Telephone Encounter (Signed)
Copied from [place] [id number]. Topic: General - Other >> Oct 27, 2019  9:50 AM Leward Quan A wrote: Reason for CRM: Patient called to inform Dr Margarita Rana that he have a sinus infection that is also contributing to his headaches. Asking if Dr Margarita Rana can please prescribe him something for this issue. Please call Ph# [phone]

## 2019-10-31 NOTE — Telephone Encounter (Signed)
Patient was called and informed of medication being refilled and to obtain OTC medications.

## 2019-11-09 ENCOUNTER — Telehealth: Payer: Self-pay | Admitting: Family Medicine

## 2019-11-09 DIAGNOSIS — G44209 Tension-type headache, unspecified, not intractable: Secondary | ICD-10-CM

## 2019-11-09 NOTE — Telephone Encounter (Signed)
Copied from [place] [id number]. Topic: Referral - Request for Referral >> Nov 09, 2019  9:44 AM Scherrie Gerlach wrote: Pt wants Dr Margarita Rana to know he is still having headaches every day.  Sometimes they cause him to be dizzy at times, and it will catch him off guard and he about falls in the floor.  Pt states he has told Dr about this before. Pt wants to know if Dr Margarita Rana will refer him to a neurologist.

## 2019-11-09 NOTE — Telephone Encounter (Signed)
Will route to PCP for review. 

## 2019-11-10 ENCOUNTER — Encounter: Payer: Self-pay | Admitting: Neurology

## 2019-11-10 NOTE — Telephone Encounter (Signed)
I have placed a neurology referral and also ordered CT head.  Can you please schedule the CT for him?  Thanks

## 2019-11-11 NOTE — Telephone Encounter (Signed)
Patient has been informed of CT scan and referral being placed

## 2019-11-22 ENCOUNTER — Ambulatory Visit (HOSPITAL_COMMUNITY): Payer: No Typology Code available for payment source

## 2019-11-23 ENCOUNTER — Ambulatory Visit (HOSPITAL_COMMUNITY)
Admission: RE | Admit: 2019-11-23 | Discharge: 2019-11-23 | Disposition: A | Discharge status: Home / Self Care | Payer: Medicaid Other | Source: Ambulatory Visit | Attending: Family Medicine | Admitting: Family Medicine

## 2019-11-23 ENCOUNTER — Encounter (HOSPITAL_COMMUNITY): Payer: Self-pay

## 2019-11-23 ENCOUNTER — Other Ambulatory Visit: Payer: Self-pay

## 2019-11-23 DIAGNOSIS — G44209 Tension-type headache, unspecified, not intractable: Secondary | ICD-10-CM | POA: Insufficient documentation

## 2019-11-23 MED ORDER — IOHEXOL 300 MG/ML  SOLN
75.0000 mL | Freq: Once | INTRAMUSCULAR | Status: AC | PRN
  Administered 2019-11-23: 75 mL via INTRAVENOUS

## 2019-11-23 NOTE — Telephone Encounter (Signed)
error 

## 2019-11-25 ENCOUNTER — Telehealth: Payer: Self-pay

## 2019-11-25 NOTE — Telephone Encounter (Signed)
Patient name and DOB has been verified Patient was informed of lab results. Patient had no questions.  

## 2019-11-25 NOTE — Telephone Encounter (Signed)
-----   Message from [name], MD sent at 11/23/2019  5:43 PM EDT ----- CT head is unremarkable

## 2019-12-06 ENCOUNTER — Other Ambulatory Visit: Payer: Self-pay | Admitting: Family Medicine

## 2019-12-06 ENCOUNTER — Telehealth: Payer: Self-pay | Admitting: Family Medicine

## 2019-12-06 DIAGNOSIS — M1A0720 Idiopathic chronic gout, left ankle and foot, without tophus (tophi): Secondary | ICD-10-CM

## 2019-12-06 DIAGNOSIS — F3289 Other specified depressive episodes: Secondary | ICD-10-CM

## 2019-12-06 DIAGNOSIS — I10 Essential (primary) hypertension: Secondary | ICD-10-CM

## 2019-12-06 DIAGNOSIS — E1169 Type 2 diabetes mellitus with other specified complication: Secondary | ICD-10-CM

## 2019-12-06 DIAGNOSIS — N528 Other male erectile dysfunction: Secondary | ICD-10-CM

## 2019-12-06 DIAGNOSIS — G44209 Tension-type headache, unspecified, not intractable: Secondary | ICD-10-CM

## 2019-12-06 NOTE — Telephone Encounter (Signed)
Medication Refill - Medication: metFORMIN (GLUCOPHAGE) 500 MG tablet ,FLUoxetine (PROZAC) 20 MG tablet,allopurinol (ZYLOPRIM) 300 MG tablet,tiZANidine (ZANAFLEX) 4 MG tablet,pantoprazole (PROTONIX) 40 MG tablet,lisinopril (ZESTRIL) 10 MG tablet,topiramate (TOPAMAX) 100 MG tablet,VIAGRA 50 MG tablet   Has the patient contacted their pharmacy? yes (Agent: If no, request that the patient contact the pharmacy for the refill.) (Agent: If yes, when and what did the pharmacy advise?)Contact PCP  Preferred Pharmacy (with phone number or street name):  [place], [place] [address] Phone:  [phone]  Fax:  [phone]       Agent: Please be advised that RX refills may take up to 3 business days. We ask that you follow-up with your pharmacy.

## 2019-12-06 NOTE — Telephone Encounter (Signed)
Requested Prescriptions  Pending Prescriptions Disp Refills   metFORMIN (GLUCOPHAGE) 500 MG tablet 120 tablet     Sig: Take 2 tablets (1,000 mg total) by mouth 2 (two) times daily with a meal.     Endocrinology:  Diabetes - Biguanides Passed - 12/06/2019  3:19 PM      Passed - Cr in normal range and within 360 days    Creatinine  Date Value Ref Range Status  09/22/2018 1.21 0.61 - 1.24 mg/dL Final   Creat  Date Value Ref Range Status  09/21/2015 0.94 0.70 - 1.33 mg/dL Final    Comment:      For patients > or = 54 years of age: The upper reference limit for Creatinine is approximately 13% higher for people identified as African-American.      Creatinine, Ser  Date Value Ref Range Status  10/25/2019 1.01 0.76 - 1.27 mg/dL Final   Creatinine, Urine  Date Value Ref Range Status  09/21/2015 171 20 - 370 mg/dL Final         Passed - HBA1C is between 0 and 7.9 and within 180 days    HbA1c, POC (prediabetic range)  Date Value Ref Range Status  09/24/2017 6.5 (A) 5.7 - 6.4 % Final   HbA1c, POC (controlled diabetic range)  Date Value Ref Range Status  10/25/2019 7.7 (A) 0.0 - 7.0 % Final         Passed - AA eGFR in normal range and within 360 days    GFR, Est African American  Date Value Ref Range Status  09/21/2015 >89 >=60 mL/min Final   GFR, Est AFR Am  Date Value Ref Range Status  09/22/2018 >60 >60 mL/min Final   GFR calc Af Amer  Date Value Ref Range Status  10/25/2019 97 >59 mL/min/1.73 Final    Comment:    **[facility] currently reports eGFR in compliance with the current**   recommendations of the [employer]. [facility] will   update reporting as new guidelines are published from the NKF-ASN   Task force.    GFR, Est Non African American  Date Value Ref Range Status  09/21/2015 >89 >=60 mL/min Final   GFR, Estimated  Date Value Ref Range Status  09/22/2018 >60 >60 mL/min Final   GFR calc non Af Amer  Date Value Ref Range Status   10/25/2019 84 >59 mL/min/1.73 Final         Passed - Valid encounter within last 6 months    Recent Outpatient Visits          1 month ago Type 2 diabetes mellitus with other specified complication, without long-term current use of insulin ([place])   Zena, Orof[place], MD   3 months ago Chronic right-sided low back pain without sciatica   Sanders, [place], MD   4 months ago Type 2 diabetes mellitus without complication, without long-term current use of insulin ([place])   [place]te, Larned, MD   5 months ago Type 2 diabetes mellitus without complication, without long-term current use of insulin ([place])   [place], Charlane Ferretti, MD   7 months ago Epistaxis   [place], Enobong, MD      Future Appointments            In 1 month Pieter Partridge, DO [facility] [place]   In 4 months  [name], MD [place]            FLUoxetine (PROZAC) 20 MG tablet 30 tablet     Sig: Take 1 tablet (20 mg total) by mouth daily.     Psychiatry:  Antidepressants - SSRI Passed - 12/06/2019  3:19 PM      Passed - Valid encounter within last 6 months    Recent Outpatient Visits          1 month ago Type 2 diabetes mellitus with other specified complication, without long-term current use of insulin ([place])   [place]ore[place], [place], MD   3 months ago Chronic right-sided low back pain without sciatica   [place], [place], MD   4 months ago Type 2 diabetes mellitus without complication, without long-term current use of insulin ([place])   [place], [place], MD   5 months ago Type 2 diabetes mellitus without complication, without long-term current use of insulin ([place])    [place], Enobong, MD   7 months ago Epistaxis   [place], Enobong, MD      Future Appointments            In 1 month Pieter Partridge, DO [place] [facility] [place]   In 4 months [name], MD [place]            allopurinol (ZYLOPRIM) 300 MG tablet 30 tablet     Sig: Take 1 tablet (300 mg total) by mouth daily.     Endocrinology:  Gout Agents Failed - 12/06/2019  3:19 PM      Failed - Uric Acid in normal range and within 360 days    Uric Acid  Date Value Ref Range Status  07/27/2018 6.0 3.7 - 8.6 mg/dL Final    Comment:               Therapeutic target for gout patients: <6.0         Passed - Cr in normal range and within 360 days    Creatinine  Date Value Ref Range Status  09/22/2018 1.21 0.61 - 1.24 mg/dL Final   Creat  Date Value Ref Range Status  09/21/2015 0.94 0.70 - 1.33 mg/dL Final    Comment:      For patients > or = 54 years of age: The upper reference limit for Creatinine is approximately 13% higher for people identified as African-American.      Creatinine, Ser  Date Value Ref Range Status  10/25/2019 1.01 0.76 - 1.27 mg/dL Final   Creatinine, Urine  Date Value Ref Range Status  09/21/2015 171 20 - 370 mg/dL Final         Passed - Valid encounter within last 12 months    Recent Outpatient Visits          1 month ago Type 2 diabetes mellitus with other specified complication, without long-term current use of insulin ([place])   [place], [place], MD   3 months ago Chronic right-sided low back pain without sciatica   [place], [place], MD   4 months ago Type 2 diabetes mellitus without complication, without long-term current use of insulin ([place])   [place], Enobong, MD   5 months  ago Type 2 diabetes mellitus  without complication, without long-term current use of insulin ([place])   [place], Charlane Ferretti, MD   7 months ago Epistaxis   [place], Charlane Ferretti, MD      Future Appointments            In 1 month Pieter Partridge, DO [place] [facility] [place]   In 4 months [name], MD [place]            tiZANidine (ZANAFLEX) 4 MG tablet 90 tablet     Sig: Take 1 tablet (4 mg total) by mouth every 8 (eight) hours as needed for muscle spasms.     Not Delegated - Cardiovascular:  Alpha-2 Agonists - tizanidine Failed - 12/06/2019  3:19 PM      Failed - This refill cannot be delegated      Passed - Valid encounter within last 6 months    Recent Outpatient Visits          1 month ago Type 2 diabetes mellitus with other specified complication, without long-term current use of insulin ([place])   [place], [place], MD   3 months ago Chronic right-sided low back pain without sciatica   [place], [place], MD   4 months ago Type 2 diabetes mellitus without complication, without long-term current use of insulin ([place])   [place], [place]acl[place], MD   5 months ago Type 2 diabetes mellitus without complication, without long-term current use of insulin ([place])   [place], Enobong, MD   7 months ago Epistaxis   [place], Enobong, MD      Future Appointments            In 1 month Pieter Partridge, DO [place] [facility] [place]   In 4 months [name], MD [place]            lisinopril (ZESTRIL) 10 MG tablet 30 tablet     Sig: Take 1 tablet (10 mg total) by mouth daily.     Cardiovascular:  ACE Inhibitors Passed - 12/06/2019  3:19 PM      Passed - Cr in normal range  and within 180 days    Creatinine  Date Value Ref Range Status  09/22/2018 1.21 0.61 - 1.24 mg/dL Final   Creat  Date Value Ref Range Status  09/21/2015 0.94 0.70 - 1.33 mg/dL Final    Comment:      For patients > or = 54 years of age: The upper reference limit for Creatinine is approximately 13% higher for people identified as African-American.      Creatinine, Ser  Date Value Ref Range Status  10/25/2019 1.01 0.76 - 1.27 mg/dL Final   Creatinine, Urine  Date Value Ref Range Status  09/21/2015 171 20 - 370 mg/dL Final         Passed - K in normal range and within 180 days    Potassium  Date Value Ref Range Status  10/25/2019 4.0 3.5 - 5.2 mmol/L Final         Passed - Patient is not pregnant      Passed - Last BP in normal range    BP Readings from Last 1 Encounters:  10/25/19 122/72  Passed - Valid encounter within last 6 months    Recent Outpatient Visits          1 month ago Type 2 diabetes mellitus with other specified complication, without long-term current use of insulin ([place])   [place], [place], MD   3 months ago Chronic right-sided low back pain without sciatica   [place], [place], MD   4 months ago Type 2 diabetes mellitus without complication, without long-term current use of insulin ([place])   [place], Shickley, MD   5 months ago Type 2 diabetes mellitus without complication, without long-term current use of insulin ([place])   [place], Enobong, MD   7 months ago Epistaxis   [place], Enobong, MD      Future Appointments            In 1 month Pieter Partridge, DO [place] [facility] [place]   In 4 months [name], MD [place]            topiramate (TOPAMAX) 100 MG tablet 60 tablet     Sig: Take 1 tablet  (100 mg total) by mouth 2 (two) times daily.     Not Delegated - Neurology: Anticonvulsants - topiramate & zonisamide Failed - 12/06/2019  3:19 PM      Failed - This refill cannot be delegated      Passed - Cr in normal range and within 360 days    Creatinine  Date Value Ref Range Status  09/22/2018 1.21 0.61 - 1.24 mg/dL Final   Creat  Date Value Ref Range Status  09/21/2015 0.94 0.70 - 1.33 mg/dL Final    Comment:      For patients > or = 54 years of age: The upper reference limit for Creatinine is approximately 13% higher for people identified as African-American.      Creatinine, Ser  Date Value Ref Range Status  10/25/2019 1.01 0.76 - 1.27 mg/dL Final   Creatinine, Urine  Date Value Ref Range Status  09/21/2015 171 20 - 370 mg/dL Final         Passed - CO2 in normal range and within 360 days    CO2  Date Value Ref Range Status  10/25/2019 20 20 - 29 mmol/L Final         Passed - Valid encounter within last 12 months    Recent Outpatient Visits          1 month ago Type 2 diabetes mellitus with other specified complication, without long-term current use of insulin ([place])   [place], [place], MD   3 months ago Chronic right-sided low back pain without sciatica   [place], [place], MD   4 months ago Type 2 diabetes mellitus without complication, without long-term current use of insulin ([place])   [place], [place], MD   5 months ago Type 2 diabetes mellitus without complication, without long-term current use of insulin ([place])   [place], Enobong, MD   7 months ago Epistaxis   [place], Enobong, MD      Future Appointments            In 1 month Pieter Partridge, DO  [place] [facility] [place]   In 4 months [name], MD [place]             sildenafil (VIAGRA) 50 MG tablet 10 tablet     Sig: TAKE 1 TABLET BY MOUTH DAILY AS NEEDED FOR ERECTILE DYSFUNCTION. AT LEAST 24 HOURS BETWEEN DOSES     Urology: Erectile Dysfunction Agents Passed - 12/06/2019  3:19 PM      Passed - Last BP in normal range    BP Readings from Last 1 Encounters:  10/25/19 122/72         Passed - Valid encounter within last 12 months    Recent Outpatient Visits          1 month ago Type 2 diabetes mellitus with other specified complication, without long-term current use of insulin ([place])   [place], [place], MD   3 months ago Chronic right-sided low back pain without sciatica   [place]man, [place], MD   4 months ago Type 2 diabetes mellitus without complication, without long-term current use of insulin ([place])   [place], [place], MD   5 months ago Type 2 diabetes mellitus without complication, without long-term current use of insulin ([place])   [place], Enobong, MD   7 months ago Epistaxis   [place], Enobong, MD      Future Appointments            In 1 month Pieter Partridge, DO [place] [facility] [place]   In 4 months [name], MD [place]            pantoprazole (PROTONIX) 40 MG tablet 60 tablet     Sig: Take 1 tablet (40 mg total) by mouth 2 (two) times daily.     Gastroenterology: Proton Pump Inhibitors Passed - 12/06/2019  3:19 PM      Passed - Valid encounter within last 12 months    Recent Outpatient Visits          1 month ago Type 2 diabetes mellitus with other specified complication, without long-term current use of insulin ([place])   [place], Lillie, MD   3 months ago Chronic right-sided low back pain without sciatica   [place], Charlane Ferretti, MD   4 months ago Type 2 diabetes mellitus without complication, without long-term current use of insulin ([place])   [place], Charlane Ferretti, MD   5 months ago Type 2 diabetes mellitus without complication, without long-term current use of insulin ([place])   [place], Charlane Ferretti, MD   7 months ago Epistaxis   [place], Enobong, MD      Future Appointments            In 1 month Tomi Likens, Stephan Minister, DO [place] [facility] [place]   In 4 months [name], MD [place]

## 2019-12-06 NOTE — Telephone Encounter (Signed)
Requested medication (s) are due for refill today: tizanidine: yes   Topamax : no  Requested medication (s) are on the active medication list: yes  Last refill:  tizanidine: 10/31/19       Topamax: 10/25/19  Future visit scheduled: yes  Notes to clinic:  med not delegated to NT to RF   Requested Prescriptions  Pending Prescriptions Disp Refills   tiZANidine (ZANAFLEX) 4 MG tablet 90 tablet     Sig: Take 1 tablet (4 mg total) by mouth every 8 (eight) hours as needed for muscle spasms.      Not Delegated - Cardiovascular:  Alpha-2 Agonists - tizanidine Failed - 12/06/2019  3:19 PM      Failed - This refill cannot be delegated      Passed - Valid encounter within last 6 months    Recent Outpatient Visits           1 month ago Type 2 diabetes mellitus with other specified complication, without long-term current use of insulin ([place])   [place], Puako, MD   3 months ago Chronic right-sided low back pain without sciatica   Magn[place], Deshler, MD   4 months ago Type 2 diabetes mellitus without complication, without long-term current use of insulin ([place])   [place]oloa, Tomales, MD   5 months ago Type 2 diabetes mellitus without complication, without long-term current use of insulin ([place])   [place]send, Enobong, MD   7 months ago Epistaxis   [place], Enobong, MD       Future Appointments             In 1 month Pieter Partridge, DO [facility] [place]   In 4 months [name], MD [place]              topiramate (TOPAMAX) 100 MG tablet 60 tablet     Sig: Take 1 tablet (100 mg total) by mouth 2 (two) times daily.      Not Delegated - Neurology: Anticonvulsants - topiramate & zonisamide Failed - 12/06/2019  3:19 PM      Failed - This refill  cannot be delegated      Passed - Cr in normal range and within 360 days    Creatinine  Date Value Ref Range Status  09/22/2018 1.21 0.61 - 1.24 mg/dL Final   Creat  Date Value Ref Range Status  09/21/2015 0.94 0.70 - 1.33 mg/dL Final    Comment:      For patients > or = 54 years of age: The upper reference limit for Creatinine is approximately 13% higher for people identified as African-American.      Creatinine, Ser  Date Value Ref Range Status  10/25/2019 1.01 0.76 - 1.27 mg/dL Final   Creatinine, Urine  Date Value Ref Range Status  09/21/2015 171 20 - 370 mg/dL Final          Passed - CO2 in normal range and within 360 days    CO2  Date Value Ref Range Status  10/25/2019 20 20 - 29 mmol/L Final          Passed - Valid encounter within last 12 months    Recent Outpatient Visits           1 month ago Type 2 diabetes mellitus with other  specified complication, without long-term current use of insulin ([place])   [place]risdale, Millersville, MD   3 months ago Chronic right-sided low back pain without sciatica   [place]wick, Baltic, MD   4 months ago Type 2 diabetes mellitus without complication, without long-term current use of insulin ([place])   [place], Rowes Run, MD   5 months ago Type 2 diabetes mellitus without complication, without long-term current use of insulin ([place])   [place], Enobong, MD   7 months ago Epistaxis   [place], Charlane Ferretti, MD       Future Appointments             In 1 month Pieter Partridge, DO [facility] [place]   In 4 months [name], MD [place]             Refused Prescriptions Disp Refills   metFORMIN (GLUCOPHAGE) 500 MG tablet 120 tablet     Sig: Take 2 tablets (1,000 mg total) by mouth 2 (two) times  daily with a meal.      Endocrinology:  Diabetes - Biguanides Passed - 12/06/2019  3:19 PM      Passed - Cr in normal range and within 360 days    Creatinine  Date Value Ref Range Status  09/22/2018 1.21 0.61 - 1.24 mg/dL Final   Creat  Date Value Ref Range Status  09/21/2015 0.94 0.70 - 1.33 mg/dL Final    Comment:      For patients > or = 54 years of age: The upper reference limit for Creatinine is approximately 13% higher for people identified as African-American.      Creatinine, Ser  Date Value Ref Range Status  10/25/2019 1.01 0.76 - 1.27 mg/dL Final   Creatinine, Urine  Date Value Ref Range Status  09/21/2015 171 20 - 370 mg/dL Final          Passed - HBA1C is between 0 and 7.9 and within 180 days    HbA1c, POC (prediabetic range)  Date Value Ref Range Status  09/24/2017 6.5 (A) 5.7 - 6.4 % Final   HbA1c, POC (controlled diabetic range)  Date Value Ref Range Status  10/25/2019 7.7 (A) 0.0 - 7.0 % Final          Passed - AA eGFR in normal range and within 360 days    GFR, Est African American  Date Value Ref Range Status  09/21/2015 >89 >=60 mL/min Final   GFR, Est AFR Am  Date Value Ref Range Status  09/22/2018 >60 >60 mL/min Final   GFR calc Af Amer  Date Value Ref Range Status  10/25/2019 97 >59 mL/min/1.73 Final    Comment:    **[facility] currently reports eGFR in compliance with the current**   recommendations of the [employer]. [facility] will   update reporting as new guidelines are published from the NKF-ASN   Task force.    GFR, Est Non African American  Date Value Ref Range Status  09/21/2015 >89 >=60 mL/min Final   GFR, Estimated  Date Value Ref Range Status  09/22/2018 >60 >60 mL/min Final   GFR calc non Af Amer  Date Value Ref Range Status  10/25/2019 84 >59 mL/min/1.73 Final          Passed - Valid encounter within last 6 months  Recent Outpatient Visits           1 month ago Type 2 diabetes mellitus with  other specified complication, without long-term current use of insulin ([place])   [place], Hoboken, MD   3 months ago Chronic right-sided low back pain without sciatica   [place], Charlane Ferretti, MD   4 months ago Type 2 diabetes mellitus without complication, without long-term current use of insulin ([place])   [place], Charlane Ferretti, MD   5 months ago Type 2 diabetes mellitus without complication, without long-term current use of insulin ([place])   [place], Enobong, MD   7 months ago Epistaxis   [place], Enobong, MD       Future Appointments             In 1 month Pieter Partridge, DO [facility] [place]   In 4 months [name], MD [place]              FLUoxetine (PROZAC) 20 MG tablet 30 tablet     Sig: Take 1 tablet (20 mg total) by mouth daily.      Psychiatry:  Antidepressants - SSRI Passed - 12/06/2019  3:19 PM      Passed - Valid encounter within last 6 months    Recent Outpatient Visits           1 month ago Type 2 diabetes mellitus with other specified complication, without long-term current use of insulin ([place])   [place], Nemacolin, MD   3 months ago Chronic right-sided low back pain without sciatica   [place], [place], MD   4 months ago Type 2 diabetes mellitus without complication, without long-term current use of insulin ([place])   [place], Lauderdale, MD   5 months ago Type 2 diabetes mellitus without complication, without long-term current use of insulin ([place])   [place], Enobong, MD   7 months ago Epistaxis   [place]arc[place], Enobong, MD        Future Appointments             In 1 month Pieter Partridge, DO [facility]   In 4 months [name], MD [place]              allopurinol (ZYLOPRIM) 300 MG tablet 30 tablet     Sig: Take 1 tablet (300 mg total) by mouth daily.      Endocrinology:  Gout Agents Failed - 12/06/2019  3:19 PM      Failed - Uric Acid in normal range and within 360 days    Uric Acid  Date Value Ref Range Status  07/27/2018 6.0 3.7 - 8.6 mg/dL Final    Comment:               Therapeutic target for gout patients: <6.0          Passed - Cr in normal range and within 360 days    Creatinine  Date Value Ref Range Status  09/22/2018 1.21 0.61 - 1.24 mg/dL Final   Creat  Date Value Ref Range Status  09/21/2015 0.94 0.70 - 1.33 mg/dL Final  Comment:      For patients > or = 55 years of age: The upper reference limit for Creatinine is approximately 13% higher for people identified as African-American.      Creatinine, Ser  Date Value Ref Range Status  10/25/2019 1.01 0.76 - 1.27 mg/dL Final   Creatinine, Urine  Date Value Ref Range Status  09/21/2015 171 20 - 370 mg/dL Final          Passed - Valid encounter within last 12 months    Recent Outpatient Visits           1 month ago Type 2 diabetes mellitus with other specified complication, without long-term current use of insulin ([place])   [place], [place], MD   3 months ago Chronic right-sided low back pain without sciatica   [place], Sullivan, MD   4 months ago Type 2 diabetes mellitus without complication, without long-term current use of insulin ([place])   [place]arthage, Templeton, MD   5 months ago Type 2 diabetes mellitus without complication, without long-term current use of insulin ([place])   [place], Enobong, MD   7 months ago  Epistaxis   [place], Enobong, MD       Future Appointments             In 1 month Pieter Partridge, DO [facility] [place]   In 4 months [name], MD [place]              lisinopril (ZESTRIL) 10 MG tablet 30 tablet     Sig: Take 1 tablet (10 mg total) by mouth daily.      Cardiovascular:  ACE Inhibitors Passed - 12/06/2019  3:19 PM      Passed - Cr in normal range and within 180 days    Creatinine  Date Value Ref Range Status  09/22/2018 1.21 0.61 - 1.24 mg/dL Final   Creat  Date Value Ref Range Status  09/21/2015 0.94 0.70 - 1.33 mg/dL Final    Comment:      For patients > or = 54 years of age: The upper reference limit for Creatinine is approximately 13% higher for people identified as African-American.      Creatinine, Ser  Date Value Ref Range Status  10/25/2019 1.01 0.76 - 1.27 mg/dL Final   Creatinine, Urine  Date Value Ref Range Status  09/21/2015 171 20 - 370 mg/dL Final          Passed - K in normal range and within 180 days    Potassium  Date Value Ref Range Status  10/25/2019 4.0 3.5 - 5.2 mmol/L Final          Passed - Patient is not pregnant      Passed - Last BP in normal range    BP Readings from Last 1 Encounters:  10/25/19 122/72          Passed - Valid encounter within last 6 months    Recent Outpatient Visits           1 month ago Type 2 diabetes mellitus with other specified complication, without long-term current use of insulin ([place])   [place], Robbinsdale, MD   3 months ago Chronic right-sided low back pain without sciatica   [place]  [place], Galatia, MD   4 months ago Type 2 diabetes mellitus without complication, without long-term current use of insulin ([place])   [place]ole[place], Freeport, MD   5 months ago Type 2 diabetes mellitus  without complication, without long-term current use of insulin ([place])   [place], Enobong, MD   7 months ago Epistaxis   [place]illedgeville, Enobong, MD       Future Appointments             In 1 month Pieter Partridge, DO [facility] [place]   In 4 months [name], MD [place]              sildenafil (VIAGRA) 50 MG tablet 10 tablet     Sig: TAKE 1 TABLET BY MOUTH DAILY AS NEEDED FOR ERECTILE DYSFUNCTION. AT LEAST 24 HOURS BETWEEN DOSES      Urology: Erectile Dysfunction Agents Passed - 12/06/2019  3:19 PM      Passed - Last BP in normal range    BP Readings from Last 1 Encounters:  10/25/19 122/72          Passed - Valid encounter within last 12 months    Recent Outpatient Visits           1 month ago Type 2 diabetes mellitus with other specified complication, without long-term current use of insulin ([place])   [place]kton, Moffett, MD   3 months ago Chronic right-sided low back pain without sciatica   [place], Eustis, MD   4 months ago Type 2 diabetes mellitus without complication, without long-term current use of insulin ([place])   [place]born, Jefferson, MD   5 months ago Type 2 diabetes mellitus without complication, without long-term current use of insulin ([place])   [place], Enobong, MD   7 months ago Epistaxis   [place], Enobong, MD       Future Appointments             In 1 month Pieter Partridge, DO [facility] [place]   In 4 months [name], MD [place]              pantoprazole (PROTONIX) 40 MG tablet 60 tablet     Sig: Take 1 tablet (40 mg total) by mouth 2 (two) times daily.      Gastroenterology:  Proton Pump Inhibitors Passed - 12/06/2019  3:19 PM      Passed - Valid encounter within last 12 months    Recent Outpatient Visits           1 month ago Type 2 diabetes mellitus with other specified complication, without long-term current use of insulin ([place])   [place], Afton, MD   3 months ago Chronic right-sided low back pain without sciatica   Ricardo, Charlane Ferretti, MD   4 months ago Type 2 diabetes mellitus without complication, without long-term current use of insulin ([place])   [place], Charlane Ferretti, MD   5 months ago Type 2 diabetes mellitus without complication, without long-term current use of insulin ([place])   [place]  [name], MD   7 months ago Epistaxis   [place], Enobong, MD       Future Appointments             In 1 month Tomi Likens, Stephan Minister, DO [facility] [place]   In 4 months [name], MD [place]

## 2019-12-07 ENCOUNTER — Other Ambulatory Visit: Payer: Self-pay | Admitting: Family Medicine

## 2019-12-07 DIAGNOSIS — N528 Other male erectile dysfunction: Secondary | ICD-10-CM

## 2019-12-07 MED ORDER — TIZANIDINE HCL 4 MG PO TABS: 4.0000 mg | ORAL_TABLET | Freq: Three times a day (TID) | ORAL | 1 refills | Status: DC | PRN

## 2019-12-07 MED FILL — TOPIRAMATE 50 MG TABLET: 50 | 30 days supply | Qty: 60 | Fill #2

## 2019-12-07 MED FILL — FLUoxetine HCL 20 MG TABS: 20 | 30 days supply | Qty: 30 | Fill #2

## 2019-12-07 MED FILL — ALLOPURINOL 300 MG TAB: 300 | 30 days supply | Qty: 30 | Fill #3

## 2019-12-07 MED FILL — PANTOPRAZOLE SOD DR 40 MG T: 40 | 30 days supply | Qty: 60 | Fill #2

## 2019-12-07 MED FILL — ATORVASTATIN CALCIUM 40 MG: 40 | 30 days supply | Qty: 30 | Fill #3

## 2019-12-07 MED FILL — tiZANidine HCL 4 MG TABS: 4 | 30 days supply | Qty: 90 | Fill #0

## 2019-12-07 MED FILL — METFORMIN HCL 500 MG TABS: 500 | 30 days supply | Qty: 60 | Fill #2

## 2019-12-07 MED FILL — LISINOPRIL 10 MG TABS: 10 | 30 days supply | Qty: 30 | Fill #3

## 2019-12-07 NOTE — Telephone Encounter (Signed)
Requested medication (s) are due for refill today: yes  Requested medication (s) are on the active medication list: no  Last refill:  09/09/19  Future visit scheduled: yes  Notes to clinic:  not delegated; d/c'd 10/25/19 per Dr Margarita Rana    Requested Prescriptions  Pending Prescriptions Disp Refills   methocarbamol (ROBAXIN) 500 MG tablet [Pharmacy Med Name: METHOCARBAMOL 500 MG TABS 500 Tablet] 20 tablet 0    Sig: TAKE 1 TABLET (500 MG TOTAL) BY MOUTH 2 (TWO) TIMES DAILY.      Not Delegated - Analgesics:  Muscle Relaxants Failed - 12/07/2019  9:13 AM      Failed - This refill cannot be delegated      Passed - Valid encounter within last 6 months    Recent Outpatient Visits           1 month ago Type 2 diabetes mellitus with other specified complication, without long-term current use of insulin ([place])   Wittmann, Highmore, MD   3 months ago Chronic right-sided low back pain without sciatica   [place], Junction City, MD   4 months ago Type 2 diabetes mellitus without complication, without long-term current use of insulin ([place])   [place], Kodiak, MD   5 months ago Type 2 diabetes mellitus without complication, without long-term current use of insulin ([place])   [place], Charlane Ferretti, MD   7 months ago Epistaxis   [place], Enobong, MD       Future Appointments             In 1 month Tomi Likens, Stephan Minister, DO [facility] [place]   In 4 months [name], MD [place]

## 2019-12-07 NOTE — Telephone Encounter (Signed)
Requested medication (s) are due for refill today: yes  Requested medication (s) are on the active medication list: {no  Last refill:  07/10/19  Future visit scheduled: yes  Notes to clinic: d/c'd by Carlisle Cater, PA on 08/26/19      Requested Prescriptions  Pending Prescriptions Disp Refills   sildenafil (VIAGRA) 50 MG tablet [Pharmacy Med Name: SILDENAFIL CITRATE 50 MG TA 50 Tablet] 4 tablet 2    Sig: TAKE 1 TABLET BY MOUTH DAILY AS NEEDED FOR ERECTILE DYSFUNCTION. AT LEAST 24 HOURS BETWEEN DOSES      Urology: Erectile Dysfunction Agents Passed - 12/07/2019  9:14 AM      Passed - Last BP in normal range    BP Readings from Last 1 Encounters:  10/25/19 122/72          Passed - Valid encounter within last 12 months    Recent Outpatient Visits           1 month ago Type 2 diabetes mellitus with other specified complication, without long-term current use of insulin ([place])   Terrell, [place], MD   3 months ago Chronic right-sided low back pain without sciatica   Jerome, Sherrelwood, MD   4 months ago Type 2 diabetes mellitus without complication, without long-term current use of insulin ([place])   [place], [place], MD   5 months ago Type 2 diabetes mellitus without complication, without long-term current use of insulin ([place])   [place], Charlane Ferretti, MD   7 months ago Epistaxis   [place], Enobong, MD       Future Appointments             In 1 month Tomi Likens, Stephan Minister, DO [facility] [place]   In 4 months [name], MD [place]

## 2019-12-27 ENCOUNTER — Ambulatory Visit: Payer: No Typology Code available for payment source | Admitting: Family Medicine

## 2020-01-04 ENCOUNTER — Telehealth: Payer: Self-pay | Admitting: Family Medicine

## 2020-01-04 ENCOUNTER — Other Ambulatory Visit: Payer: Self-pay | Admitting: Family Medicine

## 2020-01-04 MED ORDER — AMOXICILLIN 500 MG PO CAPS
500.0000 mg | ORAL_CAPSULE | Freq: Three times a day (TID) | ORAL | 0 refills | Status: DC
Start: 2020-01-04 — End: 2020-03-21

## 2020-01-04 MED FILL — AMOXICILLIN 500 MG CAPSULE: 500 | 10 days supply | Qty: 30 | Fill #0

## 2020-01-04 MED FILL — LISINOPRIL 10 MG TABS: 10 | 30 days supply | Qty: 30 | Fill #0

## 2020-01-04 MED FILL — TOPIRAMATE 100 MG TABS: 100 | 30 days supply | Qty: 60 | Fill #1

## 2020-01-04 MED FILL — ATORVASTATIN CALCIUM 40 MG: 40 | 30 days supply | Qty: 30 | Fill #0

## 2020-01-04 MED FILL — ALLOPURINOL 300 MG TAB: 300 | 30 days supply | Qty: 30 | Fill #0

## 2020-01-04 NOTE — Telephone Encounter (Signed)
Copied from [place] [id number]. Topic: Quick Communication - See Telephone Encounter >> Dec 28, 2019  2:52 PM Loma Boston wrote: CRM for notification. See Telephone encounter for: 12/28/19.Terry Poole (Patient) Terry Poole (Patient) General - Other  Posted from [phone] Reason for CRM: Pt stated he has a dentist appt on 01/31/20 to remove his tooth but there is an infection. Pt requests antibiotic to be sent to the pharmacy. Pt also requests call back to advise if Rx was approved. Cb# [phone] UPDATE pt seems confused did not know have an appt yesterday... worried about tooth infection

## 2020-01-04 NOTE — Telephone Encounter (Signed)
Pt has picked up medication from pharmacy.

## 2020-01-04 NOTE — Telephone Encounter (Signed)
Pt requesting antibiotics for tooth infection.

## 2020-01-04 NOTE — Telephone Encounter (Signed)
Done

## 2020-01-16 MED FILL — METFORMIN HCL 500 MG TABS: 500 | 30 days supply | Qty: 60 | Fill #3

## 2020-01-16 MED FILL — tiZANidine HCL 4 MG TABS: 4 | 30 days supply | Qty: 90 | Fill #1

## 2020-01-16 MED FILL — FLUoxetine HCL 20 MG TABS: 20 | 30 days supply | Qty: 30 | Fill #3

## 2020-01-16 MED FILL — PANTOPRAZOLE SOD DR 40 MG T: 40 | 30 days supply | Qty: 60 | Fill #3

## 2020-01-18 ENCOUNTER — Telehealth: Payer: Self-pay | Admitting: Family Medicine

## 2020-01-18 NOTE — Telephone Encounter (Signed)
Pt is requesting refill on Tramadol. 

## 2020-01-18 NOTE — Telephone Encounter (Signed)
He needs an office visit to justify indication for tramadol.

## 2020-01-18 NOTE — Telephone Encounter (Signed)
Copied from [place] [id number]. Topic: Quick Communication - See Telephone Encounter >> Jan 16, 2020  9:42 AM Loma Boston wrote: CRM for notification. See Telephone encounter for: 01/16/20. Pt wants CB at [phone] re script that pharmacy is sending over for refill for traMADol (ULTRAM) 50 MG tablet Medication Date: 08/26/2019 Department: [place] Ordering/Authorizing: Carlisle Cater, PA-C   Pt told that ER was last prescribed and that would probably need appt. Offered appt,  Pt still wanting office to call back to confirm status of request before appt made. [phone]

## 2020-01-18 NOTE — Telephone Encounter (Signed)
Pt was called and set up an appointment.

## 2020-01-19 ENCOUNTER — Ambulatory Visit: Payer: No Typology Code available for payment source | Attending: Family Medicine | Admitting: Family Medicine

## 2020-01-19 ENCOUNTER — Other Ambulatory Visit: Payer: Self-pay

## 2020-01-19 ENCOUNTER — Other Ambulatory Visit: Payer: Self-pay | Admitting: Family Medicine

## 2020-01-19 VITALS — BP 146/76 | HR 73 | Temp 98.3°F | Ht 69.0 in | Wt 234.0 lb

## 2020-01-19 DIAGNOSIS — Z86718 Personal history of other venous thrombosis and embolism: Secondary | ICD-10-CM | POA: Insufficient documentation

## 2020-01-19 DIAGNOSIS — R519 Headache, unspecified: Secondary | ICD-10-CM | POA: Insufficient documentation

## 2020-01-19 DIAGNOSIS — G8929 Other chronic pain: Secondary | ICD-10-CM | POA: Diagnosis not present

## 2020-01-19 DIAGNOSIS — Z87891 Personal history of nicotine dependence: Secondary | ICD-10-CM | POA: Insufficient documentation

## 2020-01-19 DIAGNOSIS — F329 Major depressive disorder, single episode, unspecified: Secondary | ICD-10-CM | POA: Insufficient documentation

## 2020-01-19 DIAGNOSIS — Z7984 Long term (current) use of oral hypoglycemic drugs: Secondary | ICD-10-CM | POA: Insufficient documentation

## 2020-01-19 DIAGNOSIS — E785 Hyperlipidemia, unspecified: Secondary | ICD-10-CM | POA: Insufficient documentation

## 2020-01-19 DIAGNOSIS — Z86711 Personal history of pulmonary embolism: Secondary | ICD-10-CM | POA: Insufficient documentation

## 2020-01-19 DIAGNOSIS — I10 Essential (primary) hypertension: Secondary | ICD-10-CM | POA: Insufficient documentation

## 2020-01-19 DIAGNOSIS — M545 Low back pain, unspecified: Secondary | ICD-10-CM | POA: Diagnosis not present

## 2020-01-19 DIAGNOSIS — E119 Type 2 diabetes mellitus without complications: Secondary | ICD-10-CM | POA: Insufficient documentation

## 2020-01-19 DIAGNOSIS — Z79899 Other long term (current) drug therapy: Secondary | ICD-10-CM | POA: Insufficient documentation

## 2020-01-19 DIAGNOSIS — M109 Gout, unspecified: Secondary | ICD-10-CM | POA: Insufficient documentation

## 2020-01-19 MED ORDER — LIDOCAINE 5 % EX PTCH: 1.0000 | MEDICATED_PATCH | CUTANEOUS | 1 refills | Status: AC

## 2020-01-19 MED ORDER — DULOXETINE HCL 60 MG PO CPEP: 60.0000 mg | ORAL_CAPSULE | Freq: Every day | ORAL | 3 refills | Status: DC

## 2020-01-19 MED FILL — DULoxetine HCL 60 MG CPEP: 60 | 30 days supply | Qty: 30 | Fill #0

## 2020-01-19 NOTE — Patient Instructions (Signed)
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
## 2020-01-19 NOTE — Progress Notes (Signed)
Requesting refill on Tramadol for his back pain.
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
## 2020-01-30 ENCOUNTER — Other Ambulatory Visit: Payer: Self-pay | Admitting: Neurology

## 2020-01-30 ENCOUNTER — Ambulatory Visit (INDEPENDENT_AMBULATORY_CARE_PROVIDER_SITE_OTHER): Payer: No Typology Code available for payment source | Admitting: Neurology

## 2020-01-30 ENCOUNTER — Encounter: Payer: Self-pay | Admitting: Neurology

## 2020-01-30 ENCOUNTER — Other Ambulatory Visit: Payer: Self-pay

## 2020-01-30 VITALS — BP 123/78 | HR 82 | Ht 69.0 in | Wt 230.6 lb

## 2020-01-30 DIAGNOSIS — G44229 Chronic tension-type headache, not intractable: Secondary | ICD-10-CM | POA: Diagnosis not present

## 2020-01-30 MED ORDER — NORTRIPTYLINE HCL 25 MG PO CAPS
25.0000 mg | ORAL_CAPSULE | Freq: Every day | ORAL | 5 refills | Status: DC
Start: 2020-01-30 — End: 2020-01-30

## 2020-01-30 NOTE — Patient Instructions (Signed)
1.  Discontinue fluoxetine.  Start nortriptyline 25mg  at bedtime.  If no improvement in 6 weeks, contact me and we can increase dose. 2.  Limit use of pain relievers to no more than 2 days out of week to prevent risk of rebound or medication-overuse headache. 3.  Stop tramadol 4.  Follow up 6 months.

## 2020-01-31 MED FILL — NORTRIPTYLINE HCL 25 MG CAP: 25 | 30 days supply | Qty: 30 | Fill #0

## 2020-02-13 ENCOUNTER — Ambulatory Visit: Payer: Medicaid Other | Admitting: Family Medicine

## 2020-02-16 MED FILL — DULoxetine HCL 60 MG CPEP: 60 | 30 days supply | Qty: 30 | Fill #1

## 2020-02-16 MED FILL — LISINOPRIL 10 MG TABS: 10 | 30 days supply | Qty: 30 | Fill #1

## 2020-02-16 MED FILL — ALLOPURINOL 300 MG TAB: 300 | 30 days supply | Qty: 30 | Fill #1

## 2020-02-21 ENCOUNTER — Ambulatory Visit: Payer: Medicaid Other | Admitting: Family Medicine

## 2020-03-05 ENCOUNTER — Telehealth: Payer: Self-pay | Admitting: Family Medicine

## 2020-03-05 NOTE — Telephone Encounter (Signed)
Copied from [place] [id number]. Topic: General - Call Back - No Documentation >> Mar 05, 2020 12:01 PM Erick Blinks wrote: Best contact: [phone] Pt is requesting a call back, has questions for social worker about community housing assistance.

## 2020-03-06 NOTE — Telephone Encounter (Signed)
Call returned to patient. He explained that he is looking for housing resources for his granddaughter.  She is 55 yo and is living with him. She is currently receiving chemo treatments for cancer at [facility].  She is facing many challenges in addition to caring for her 58 yo autistic child.  She has been in contact with the [employer] and is on the wait list for section 8 housing for 3 years.  He would like to know if there are any other resources for housing.  Informed him about the [employer].  He also said that she has spoken to various case workers that she has come in contact with.  Informed him that [email or web address] may be an option.  He requested that information be text to him.    [email or web address] was text to him as requested and he called this CM back inquiring about housing in [place] as it would be closer to [facility].  Explained to him that he can check availability in [place] on [email or web address]   Also informed him that this CM would check with Christa See, LCSW to inquire about other possible resources.

## 2020-03-09 ENCOUNTER — Other Ambulatory Visit: Payer: Self-pay | Admitting: Family Medicine

## 2020-03-09 MED FILL — MITIGARE 0.6 MG CAPS: 0.6 | 15 days supply | Qty: 30 | Fill #1

## 2020-03-09 MED FILL — NORTRIPTYLINE HCL 25 MG CAP: 25 | 30 days supply | Qty: 30 | Fill #1

## 2020-03-09 NOTE — Telephone Encounter (Signed)
Requested medication (s) are due for refill today: no  Requested medication (s) are on the active medication list: yes   Last refill:  01/16/2020  Future visit scheduled: Yes  Notes to clinic:  this refill cannot be delegated    Requested Prescriptions  Pending Prescriptions Disp Refills   tiZANidine (ZANAFLEX) 4 MG tablet [Pharmacy Med Name: tiZANidine HCL 4 MG TABS 4 Tablet] 90 tablet 1    Sig: Take 1 tablet (4 mg total) by mouth every 8 (eight) hours as needed for muscle spasms.      Not Delegated - Cardiovascular:  Alpha-2 Agonists - tizanidine Failed - 03/09/2020  2:01 PM      Failed - This refill cannot be delegated      Passed - Valid encounter within last 6 months    Recent Outpatient Visits           1 month ago Chronic midline low back pain without sciatica   [place], Arvin, MD   4 months ago Type 2 diabetes mellitus with other specified complication, without long-term current use of insulin ([place])   [place], [place], MD   6 months ago Chronic right-sided low back pain without sciatica   [place], [place], MD   7 months ago Type 2 diabetes mellitus without complication, without long-term current use of insulin ([place])   [place], [place], MD   8 months ago Type 2 diabetes mellitus without complication, without long-term current use of insulin ([place])   [place], [place], MD       Future Appointments             In 1 month [name], MD [place]               methocarbamol (ROBAXIN) 500 MG tablet [Pharmacy Med Name: METHOCARBAMOL 500 MG TABS 500 Tablet] 20 tablet 0    Sig: TAKE 1 TABLET (500 MG TOTAL) BY MOUTH 2 (TWO) TIMES DAILY.      Not Delegated - Analgesics:  Muscle Relaxants Failed - 03/09/2020  2:01 PM      Failed -  This refill cannot be delegated      Passed - Valid encounter within last 6 months    Recent Outpatient Visits           1 month ago Chronic midline low back pain without sciatica   [place], [place], MD   4 months ago Type 2 diabetes mellitus with other specified complication, without long-term current use of insulin ([place])   [place], [place], MD   6 months ago Chronic right-sided low back pain without sciatica   [place], Charlane Ferretti, MD   7 months ago Type 2 diabetes mellitus without complication, without long-term current use of insulin ([place])   [place], Charlane Ferretti, MD   8 months ago Type 2 diabetes mellitus without complication, without long-term current use of insulin ([place])   [place], MD       Future Appointments             In 1 month [name], MD [place]

## 2020-03-09 NOTE — Telephone Encounter (Signed)
Copied from [place] [id number]. Topic: General - Other >> Mar 09, 2020  2:41 PM Keene Breath wrote: Reason for CRM: Patient called to inform the doctor that he went to the dentist today and was told he had bacteria in his lungs.  He would like the doctor to send a prescription for antibiotics.  Please advise and call patient to confirm.  CB# [phone]

## 2020-03-12 ENCOUNTER — Other Ambulatory Visit: Payer: Self-pay | Admitting: Family Medicine

## 2020-03-12 MED FILL — METHOCARBAMOL 500 MG TABS: 500 | 10 days supply | Qty: 20 | Fill #0

## 2020-03-12 MED FILL — tiZANidine HCL 4 MG TABS: 4 | 30 days supply | Qty: 90 | Fill #0

## 2020-03-12 NOTE — Telephone Encounter (Signed)
Pt calling stating that he received a text from PCP regarding the medication he needs for this. Please advise.

## 2020-03-13 NOTE — Telephone Encounter (Signed)
Pt was called and a VM was left informing pt to return phone call. 

## 2020-03-16 MED FILL — ATORVASTATIN CALCIUM 40 MG: 40 | 30 days supply | Qty: 30 | Fill #1

## 2020-03-16 MED FILL — LISINOPRIL 10 MG TABS: 10 | 30 days supply | Qty: 30 | Fill #2

## 2020-03-16 MED FILL — METFORMIN HCL 500 MG TABS: 500 | 30 days supply | Qty: 120 | Fill #0

## 2020-03-16 NOTE — Telephone Encounter (Signed)
Pt returned call, seeking follow up on request for antibiotics

## 2020-03-16 NOTE — Telephone Encounter (Signed)
Pt returned call advised Oley Balm to let patient know antibiotics were sent on 2/7.

## 2020-03-20 NOTE — Telephone Encounter (Addendum)
Pt  Is stating that he has never received any meds for the bacteria in his mouth, they continue to refuse his dental procedure until he gets on antibodies. Please advise [phone]

## 2020-03-21 ENCOUNTER — Other Ambulatory Visit: Payer: Self-pay | Admitting: Family Medicine

## 2020-03-21 MED ORDER — AMOXICILLIN 500 MG PO CAPS: 500.0000 mg | ORAL_CAPSULE | Freq: Three times a day (TID) | ORAL | 0 refills | Status: DC

## 2020-03-21 MED FILL — AMOXICILLIN 500 MG CAPSULE: 500 | 10 days supply | Qty: 30 | Fill #0

## 2020-03-21 NOTE — Telephone Encounter (Signed)
Done

## 2020-03-21 NOTE — Addendum Note (Signed)
Addended by: [name] on: 03/21/2020 12:38 PM   Modules accepted: Orders

## 2020-03-21 NOTE — Telephone Encounter (Signed)
Went to dentist and he was informed that he has infection in mouth and can not get any dental work done until infection is clear. Pt is requesting antibiotic.

## 2020-03-28 ENCOUNTER — Emergency Department (HOSPITAL_COMMUNITY): Payer: Medicaid Other

## 2020-03-28 ENCOUNTER — Emergency Department (HOSPITAL_COMMUNITY)
Admission: EM | Admit: 2020-03-28 | Discharge: 2020-03-28 | Disposition: A | Discharge status: Home / Self Care | Payer: Medicaid Other | Attending: Emergency Medicine | Admitting: Emergency Medicine

## 2020-03-28 ENCOUNTER — Encounter (HOSPITAL_COMMUNITY): Payer: Self-pay | Admitting: Emergency Medicine

## 2020-03-28 DIAGNOSIS — Z96651 Presence of right artificial knee joint: Secondary | ICD-10-CM | POA: Diagnosis not present

## 2020-03-28 DIAGNOSIS — E119 Type 2 diabetes mellitus without complications: Secondary | ICD-10-CM | POA: Diagnosis not present

## 2020-03-28 DIAGNOSIS — Z87891 Personal history of nicotine dependence: Secondary | ICD-10-CM | POA: Diagnosis not present

## 2020-03-28 DIAGNOSIS — M25551 Pain in right hip: Secondary | ICD-10-CM | POA: Insufficient documentation

## 2020-03-28 DIAGNOSIS — Z7984 Long term (current) use of oral hypoglycemic drugs: Secondary | ICD-10-CM | POA: Insufficient documentation

## 2020-03-28 DIAGNOSIS — I10 Essential (primary) hypertension: Secondary | ICD-10-CM | POA: Insufficient documentation

## 2020-03-28 DIAGNOSIS — Z79899 Other long term (current) drug therapy: Secondary | ICD-10-CM | POA: Insufficient documentation

## 2020-03-28 MED ORDER — OXYCODONE-ACETAMINOPHEN 5-325 MG PO TABS: 1.0000 | ORAL_TABLET | Freq: Three times a day (TID) | ORAL | 0 refills | Status: AC | PRN

## 2020-03-28 MED ORDER — DEXAMETHASONE SODIUM PHOSPHATE 10 MG/ML IJ SOLN
10.0000 mg | Freq: Once | INTRAMUSCULAR | Status: AC
  Administered 2020-03-28: 10 mg via INTRAMUSCULAR
  Filled 2020-03-28: qty 1

## 2020-03-28 MED ORDER — OXYCODONE-ACETAMINOPHEN 5-325 MG PO TABS
1.0000 | ORAL_TABLET | Freq: Once | ORAL | Status: AC
  Administered 2020-03-28: 1 via ORAL
  Filled 2020-03-28: qty 1

## 2020-03-28 NOTE — ED Notes (Signed)
Patient verbalizes understanding of discharge instructions. Opportunity for questioning and answers were provided. Armband removed by staff, pt discharged from ED.  

## 2020-03-28 NOTE — ED Triage Notes (Signed)
Patient complains of right sided hip pain that started in 2020 after a knee surgery. Patient states his orthopedist told him he would probably need a hip replacement.

## 2020-03-28 NOTE — ED Provider Notes (Addendum)
[facility] EMERGENCY DEPARTMENT Provider Note   CSN: [id number] Arrival date & time: 03/28/20  1228     History Chief Complaint  Patient presents with  . Hip Pain    Terry Poole is a 55 y.o. male.  HPI   Patient is a 55 year old male with a medical history as noted below.  He presents the emergency department due to right hip pain.  Patient states that his symptoms are chronic but began to worsen about 2 weeks ago.  No new injuries.  Patient states his pain radiates from his right hip down the posterior aspect of his right leg.  Worsens with ambulation and movement of the right leg.  No numbness, tingling, or weakness.     Past Medical History:  Diagnosis Date  . Bronchitis   . Bronchitis   . DVT (deep venous thrombosis) ([place])   . Gout   . Hammertoe of second toe of right foot   . Smoker     Patient Active Problem List   Diagnosis Date Noted  . Total knee replacement status, right 04/14/2019  . Osteoarthritis of right knee 12/10/2017  . DVT (deep venous thrombosis) ([place]) 10/13/2017  . Pulmonary embolism ([place]) 10/02/2017  . AKI (acute kidney injury) ([place]) 09/22/2017  . Chest pain 09/22/2017  . Hypertension 11/19/2016  . Type 2 diabetes mellitus ([place]) 08/19/2016  . Hyperlipidemia 08/19/2016  . Back pain 05/13/2016  . Hammertoe of second toe of right foot 03/06/2016  . Onychomycosis 04/16/2015  . Pseudofolliculitis barbae 22/63/3354  . Gout 03/28/2015    Past Surgical History:  Procedure Laterality Date  . HAMMER TOE SURGERY Right 03/10/2016   Procedure: 2ND RIGHT HAMMER TOE CORRECTION;  Surgeon: Landis Martins, DPM;  Location: [place];  Service: Podiatry;  Laterality: Right;  . MASS EXCISION Right 03/10/2016   Procedure: EXCISION OF CALLUS 2ND RIGHT TOE;  Surgeon: Landis Martins, DPM;  Location: [place];  Service: Podiatry;  Laterality: Right;  . NO PAST SURGERIES         Family History  Problem  Relation Age of Onset  . Diabetes Mother   . Healthy Father   . Heart disease Neg Hx   . Kidney disease Neg Hx     Social History   Tobacco Use  . Smoking status: Former Smoker    Packs/day: 0.25    Types: Cigarettes    Quit date: 01/25/2019    Years since quitting: 1.1  . Smokeless tobacco: Never Used  . Tobacco comment: 8 cigs daily  Vaping Use  . Vaping Use: Never used  Substance Use Topics  . Alcohol use: No  . Drug use: No    Home Medications Prior to Admission medications   Medication Sig Start Date End Date Taking? Authorizing Provider  oxyCODONE-acetaminophen (PERCOCET/ROXICET) 5-325 MG tablet Take 1 tablet by mouth every 8 (eight) hours as needed for severe pain. 03/28/20  Yes Rayna Sexton, PA-C  allopurinol (ZYLOPRIM) 300 MG tablet Take 1 tablet (300 mg total) by mouth daily. 10/25/19   [name], MD  amoxicillin (AMOXIL) 500 MG capsule Take 1 capsule (500 mg total) by mouth 3 (three) times daily. 03/21/20   [name], MD  atorvastatin (LIPITOR) 40 MG tablet Take 1 tablet (40 mg total) by mouth daily. 10/25/19   [name], MD  Colchicine (MITIGARE) 0.6 MG CAPS TAKE 1 CAPSULE (0.6 MG) BY MOUTH AT THE ONSET OF A GOUT ATTACK, MAY REPEAT 1 CAPSULE IN 2  HOURS IF SYMPTOMS PERSIST 10/25/19   [name], MD  DULoxetine (CYMBALTA) 60 MG capsule Take 1 capsule (60 mg total) by mouth daily. For back 01/19/20   [name], MD  fluticasone (FLONASE) 50 MCG/ACT nasal spray Place 2 sprays into both nostrils daily as needed for allergies. 01/13/19   Ladell Pier, MD  iron polysaccharides (NIFEREX) 150 MG capsule Take by mouth. 01/08/19   [provider]  lidocaine (LIDODERM) 5 % Place 1 patch onto the skin daily. Remove & Discard patch within 12 hours or as directed by MD 01/19/20   [name], MD  lisinopril (ZESTRIL) 10 MG tablet Take 1 tablet (10 mg total) by mouth daily. 10/25/19   [name], MD  metFORMIN (GLUCOPHAGE) 500 MG  tablet Take 2 tablets (1,000 mg total) by mouth 2 (two) times daily with a meal. 10/25/19   Newlin, Charlane Ferretti, MD  methocarbamol (ROBAXIN) 500 MG tablet TAKE 1 TABLET (500 MG TOTAL) BY MOUTH 2 (TWO) TIMES DAILY. 03/12/20   [name], MD  nortriptyline (PAMELOR) 25 MG capsule Take 1 capsule (25 mg total) by mouth at bedtime. 01/30/20   Tomi Likens, Adam R, DO  pantoprazole (PROTONIX) 40 MG tablet Take 1 tablet (40 mg total) by mouth 2 (two) times daily. 10/25/19   [name], MD  sildenafil (VIAGRA) 50 MG tablet TAKE 1 TABLET BY MOUTH DAILY AS NEEDED FOR ERECTILE DYSFUNCTION. AT LEAST 24 HOURS BETWEEN DOSES 12/09/19   [name], MD  tiZANidine (ZANAFLEX) 4 MG tablet TAKE 1 TABLET (4 MG TOTAL) BY MOUTH EVERY 8 (EIGHT) HOURS AS NEEDED FOR MUSCLE SPASMS. 03/12/20   [name], MD  topiramate (TOPAMAX) 100 MG tablet Take 1 tablet (100 mg total) by mouth 2 (two) times daily. 10/25/19   [name], MD    Allergies    Vicodin [hydrocodone-acetaminophen]  Review of Systems   Review of Systems  Musculoskeletal: Positive for arthralgias and back pain.  Skin: Negative for color change and wound.  Neurological: Negative for weakness and numbness.   Physical Exam Updated Vital Signs BP (!) 127/112 (BP Location: Right Arm)   Pulse 97   Temp 98.4 F (36.9 C) (Oral)   Resp 17   SpO2 97%   Physical Exam Vitals and nursing note reviewed.  Constitutional:      General: He is not in acute distress.    Appearance: He is well-developed.  HENT:     Head: Normocephalic and atraumatic.     Right Ear: External ear normal.     Left Ear: External ear normal.  Eyes:     General: No scleral icterus.       Right eye: No discharge.        Left eye: No discharge.     Conjunctiva/sclera: Conjunctivae normal.  Neck:     Trachea: No tracheal deviation.  Cardiovascular:     Rate and Rhythm: Normal rate.  Pulmonary:     Effort: Pulmonary effort is normal. No respiratory distress.     Breath  sounds: No stridor.  Abdominal:     General: There is no distension.  Musculoskeletal:        General: Tenderness present. No swelling or deformity.     Cervical back: Neck supple.     Right lower leg: No edema.     Left lower leg: No edema.     Comments: Moderate TTP noted to the right posterior hip.  Positive straight leg raise on the right.  Negative contralateral straight leg  raise.  No midline spine pain.  Patient is ambulatory.  Distal sensation intact in bilateral lower extremities.  Palpable pedal pulses.  No leg swelling.  Skin:    General: Skin is warm and dry.     Findings: No rash.  Neurological:     Mental Status: He is alert.     Cranial Nerves: Cranial nerve deficit: no gross deficits.    ED Results / Procedures / Treatments   Labs (all labs ordered are listed, but only abnormal results are displayed) Labs Reviewed - No data to display  EKG None  Radiology DG Hip Unilat  With Pelvis 2-3 Views Right  Result Date: 03/28/2020 CLINICAL DATA:  Right hip pain. EXAM: DG HIP (WITH OR WITHOUT PELVIS) 2-3V RIGHT COMPARISON:  Lumbar spine 03/23/2018. FINDINGS: Degenerative changes lumbar spine. Mild degenerative changes both hips. No acute bony or joint abnormality tiny corticated bony density noted adjacent to the right greater trochanter, most likely from old injury. No evidence of acute fracture or dislocation. IMPRESSION: Degenerative changes lumbar spine and both hips. No acute abnormality identified. Electronically Signed   By: Marcello Moores  Register   On: 03/28/2020 13:23   Procedures Procedures   Medications Ordered in ED Medications  oxyCODONE-acetaminophen (PERCOCET/ROXICET) 5-325 MG per tablet 1 tablet (1 tablet Oral Given 03/28/20 1433)  dexamethasone (DECADRON) injection 10 mg (10 mg Intramuscular Given 03/28/20 1433)   ED Course  I have reviewed the triage vital signs and the nursing notes.  Pertinent labs & imaging results that were available during my care of  the patient were reviewed by me and considered in my medical decision making (see chart for details).  Clinical Course as of 03/28/20 1506  Wed Mar 28, 2020  1326 DG Hip Unilat  With Pelvis 2-3 Views Right  IMPRESSION: Degenerative changes lumbar spine and both hips. No acute abnormality identified. [LJ]    Clinical Course User Index [LJ] Moody Bruins   MDM Rules/Calculators/A&P                          Patient is a 55 year old male who presents the emergency department due to acute on chronic right hip pain.  X-rays were obtained in triage which show degenerative changes of the lumbar spine as well as both hips.  He does have a positive straight leg raise on the right.  Neurovascularly intact in the bilateral lower extremities.  Patient is ambulatory.  No midline spine pain.  No red flags.  Patient given a Percocet as well as a dose of Decadron here in the emergency department.   Patient given a referral for a new orthopedist at his request.  Patient given a short course of Percocet for breakthrough pain.  Application of ice/heat.  We discussed safety regarding this medication.  Discussed return precautions.  His questions were answered and he was amicable at the time of discharge.  Final Clinical Impression(s) / ED Diagnoses Final diagnoses:  Right hip pain   Rx / DC Orders ED Discharge Orders         Ordered    oxyCODONE-acetaminophen (PERCOCET/ROXICET) 5-325 MG tablet  Every 8 hours PRN        03/28/20 1454           Rayna Sexton, PA-C 03/28/20 1506    Rayna Sexton, PA-C 03/28/20 1523    Charlesetta Shanks, MD 04/01/20 [phone]

## 2020-03-28 NOTE — Discharge Instructions (Addendum)
Like we discussed, I am prescribing you a strong narcotic called Percocet.  Please only take this if you are experiencing breakthrough pain you cannot control.  Only take this as prescribed.  This medication can be constipating so please be sure you are staying hydrated.  Do not operate a motor vehicle after taking this.  Do not mix this with alcohol.  Below is the contact information for Dr. Stann Mainland.  He is a Patent examiner.  Please give him a call tomorrow to schedule an appointment for reevaluation.  If your symptoms worsen, you can return to the emergency department for reevaluation.  It was a pleasure to meet you.

## 2020-03-30 ENCOUNTER — Telehealth: Payer: Self-pay | Admitting: Licensed Clinical Social Worker

## 2020-03-30 NOTE — Telephone Encounter (Signed)
Call placed to patient to inquire about housing needs. LCSW left message requesting a return call.

## 2020-04-03 NOTE — Telephone Encounter (Signed)
Call placed to patient. LCSW introduced self and explained role at [facility]. LCSW inquired if patient was in need of any additional resources. Pt was appreciative for the follow up call and shared that he will contact the clinic if any needs arise. Pt was reminded of upcoming appointment scheduled for 04/25/20. No additional concerns noted.

## 2020-04-11 ENCOUNTER — Other Ambulatory Visit: Payer: Self-pay | Admitting: Pharmacy Technician

## 2020-04-11 MED FILL — ALLOPURINOL 300 MG TAB: 300 | 30 days supply | Qty: 30 | Fill #2

## 2020-04-11 MED FILL — NORTRIPTYLINE HCL 25 MG CAP: 25 | 30 days supply | Qty: 30 | Fill #2

## 2020-04-11 MED FILL — DULoxetine HCL 60 MG CPEP: 60 | 30 days supply | Qty: 30 | Fill #2

## 2020-04-11 MED FILL — METHOCARBAMOL 500 MG TABS: 500 | 10 days supply | Qty: 20 | Fill #1

## 2020-04-11 MED FILL — PANTOPRAZOLE SOD DR 40 MG T: 40 | 30 days supply | Qty: 60 | Fill #0

## 2020-04-25 ENCOUNTER — Ambulatory Visit: Payer: No Typology Code available for payment source | Admitting: Family Medicine

## 2020-05-03 IMAGING — CT CT ABD-PELV W/ CM
2 of 5 series · 16 of 46 positions shown, 18 images · IV contrast (APPLIED)
Comparison: 10/04/2017

CLINICAL DATA: Nonspecific pancreatic soft tissue prominence and
peripancreatic lymph nodes on recent CT.

EXAM:
CT ABDOMEN AND PELVIS WITH CONTRAST
TECHNIQUE: Multidetector CT imaging of the abdomen and pelvis was performed
using the standard protocol following bolus administration of
intravenous contrast.
CONTRAST:  100mL OMNIPAQUE IOHEXOL 300 MG/ML  SOLN

[Series 3: abdomen 5.0 · axial · 0.78mm/px · z∈[+817,+1162]mm · 13 of 81 slices shown, 15 images]
[im 6/81  soft-tissue]
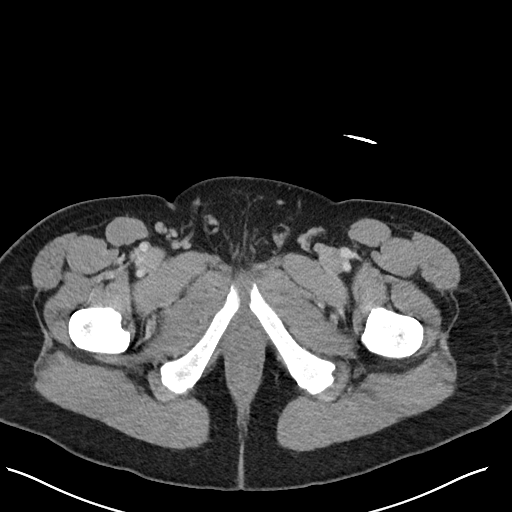
[im 6/81  bone]
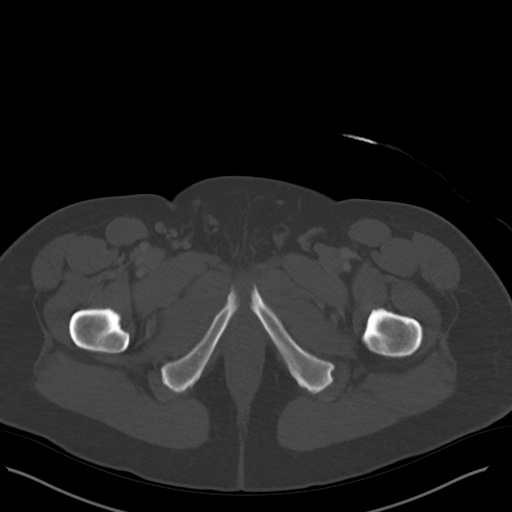
[im 12/81  soft-tissue]
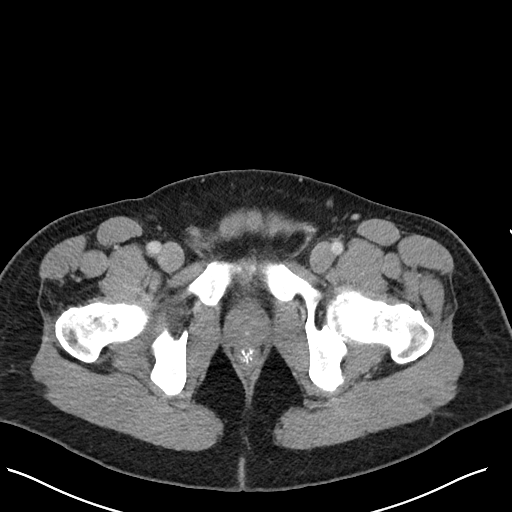
[im 18/81  soft-tissue]
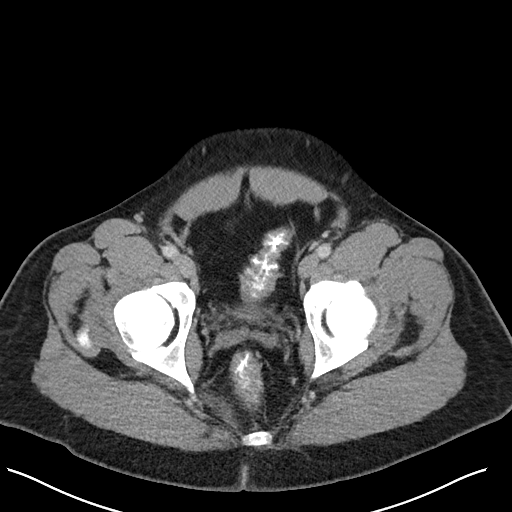
[im 23/81  soft-tissue]
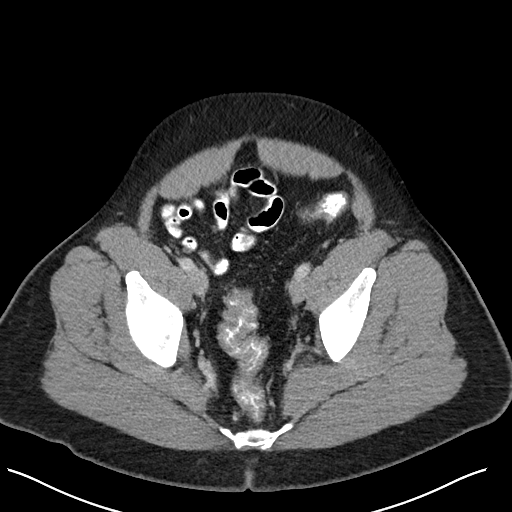
[im 29/81  soft-tissue]
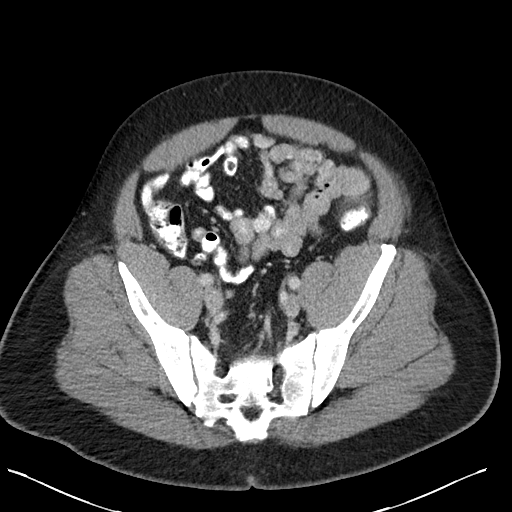
[im 35/81  soft-tissue]
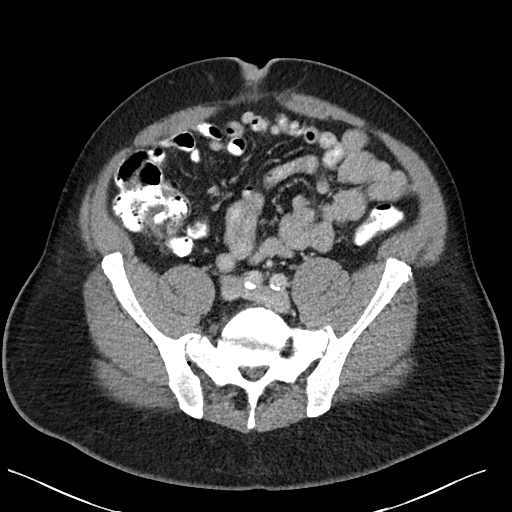
[im 41/81  soft-tissue]
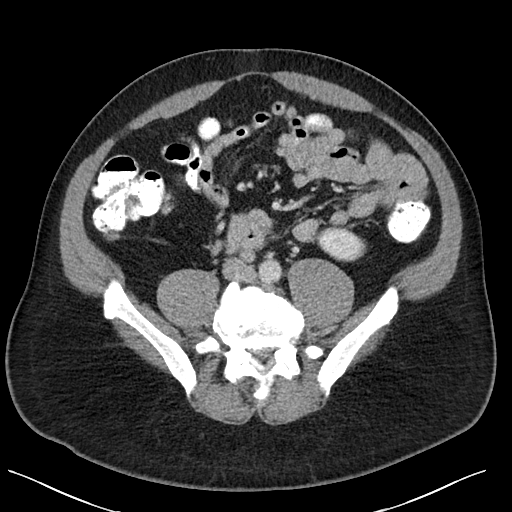
[im 46/81  soft-tissue]
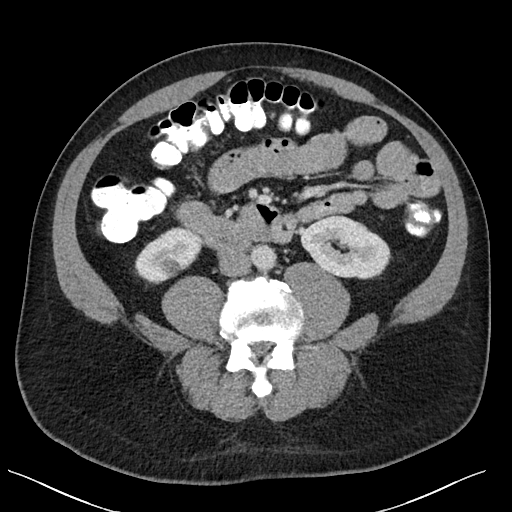
[im 52/81  soft-tissue]
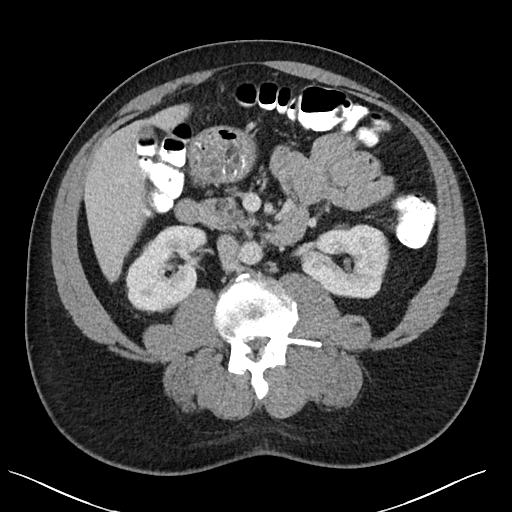
[im 52/81  bone]
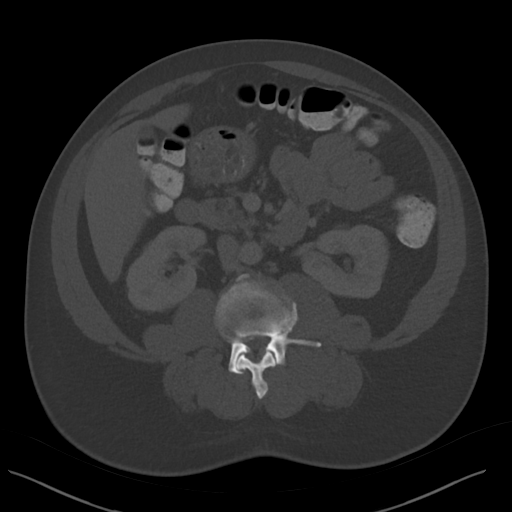
[im 58/81  soft-tissue]
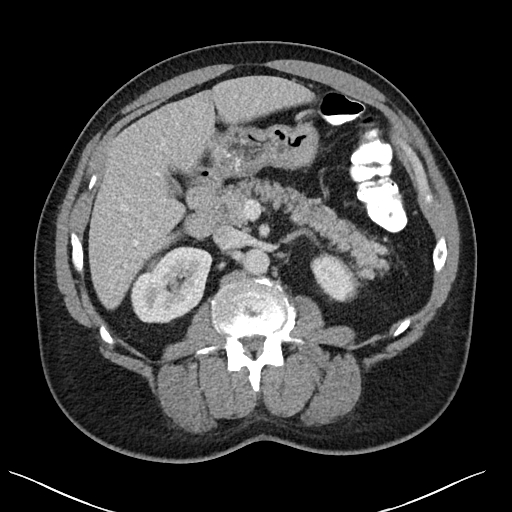
[im 63/81  soft-tissue]
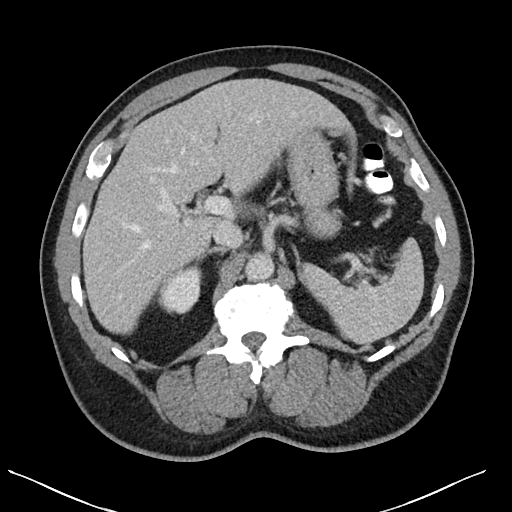
[im 69/81  soft-tissue]
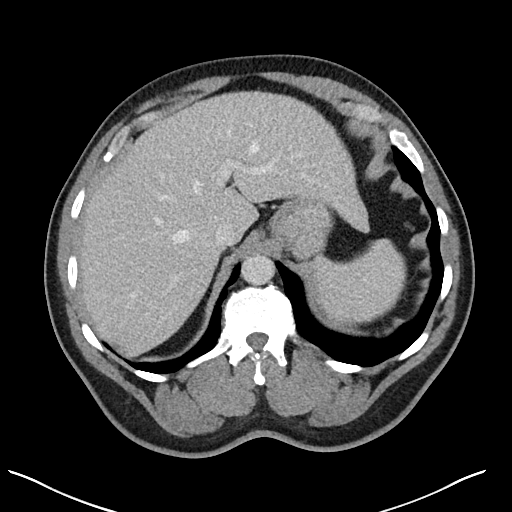
[im 75/81  soft-tissue]
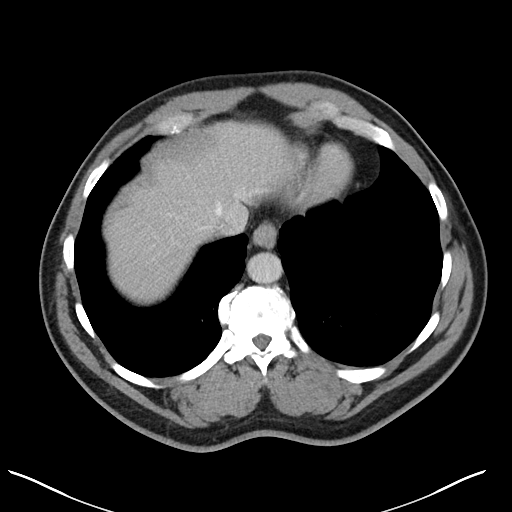

[Series 6: abdomen 3.0 mpr cor · coronal · 0.70mm/px · 3 of 123 slices shown]
[im 41/123  soft-tissue]
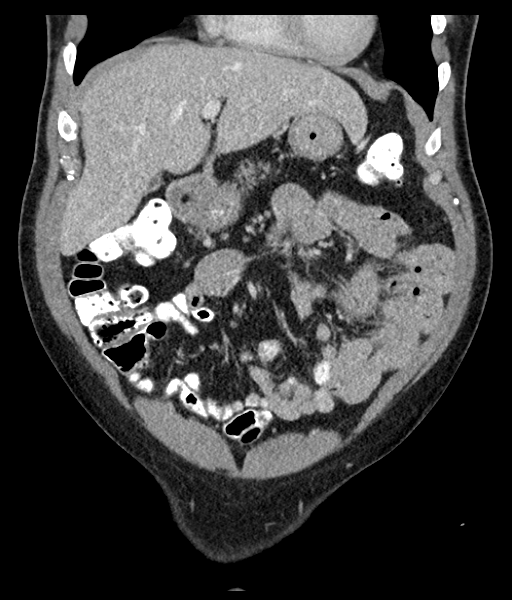
[im 55/123  soft-tissue]
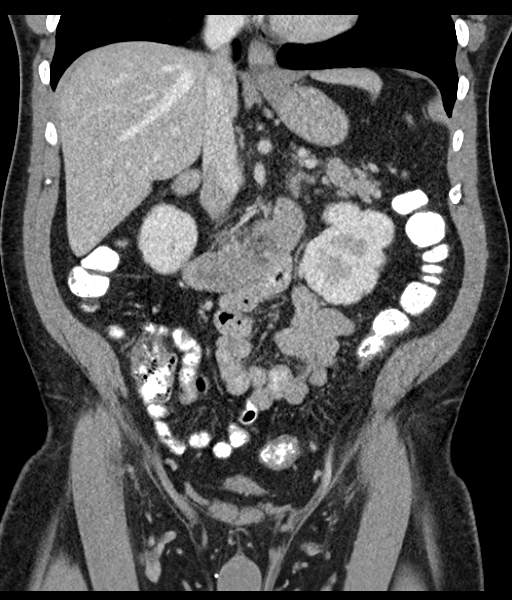
[im 68/123  soft-tissue]
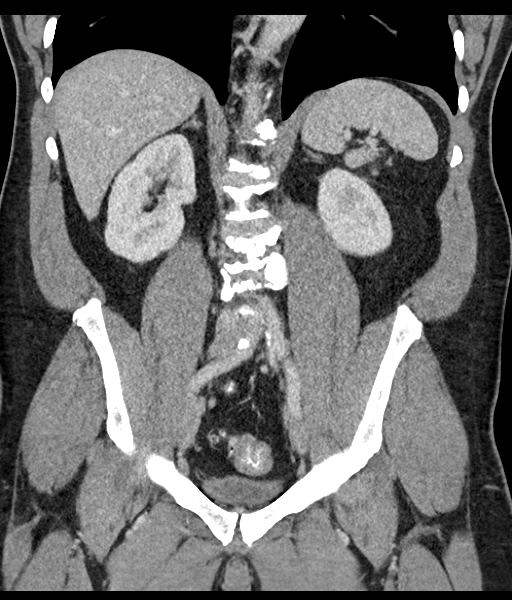

[16 of 46 positions shown; findings below may reference images not displayed]

FINDINGS: Lower Chest: No acute findings.

Hepatobiliary: No hepatic masses identified. Gallbladder is
unremarkable.

Pancreas: No mass or inflammatory changes. Previously seen soft
tissue prominence and adjacent stranding involving the pancreatic
tail has resolved since previous study.

Spleen: Within normal limits in size and appearance.

Adrenals/Urinary Tract: No masses identified. Small right lower pole
renal cyst again noted. No evidence of hydronephrosis.

Stomach/Bowel: No evidence of obstruction, inflammatory process or
abnormal fluid collections. Normal appendix visualized. Mild
diverticulosis of sigmoid colon again noted.

Vascular/Lymphatic: No pathologically enlarged lymph nodes.
Previously seen shotty peripancreatic lymph nodes adjacent to the
tail are no longer visualized. No abdominal aortic aneurysm.

Reproductive:  No mass or other significant abnormality.

Other:  Stable small paraumbilical hernia containing only fat.

Musculoskeletal:  No suspicious bone lesions identified.
IMPRESSION: No acute findings.

Resolution of focal pancreatitis and mild reactive lymphadenopathy
since previous study.

Mild sigmoid diverticulosis, without radiographic evidence of
diverticulitis.

Stable small paraumbilical hernia containing only fat.

## 2020-05-05 ENCOUNTER — Other Ambulatory Visit: Payer: Self-pay

## 2020-05-16 ENCOUNTER — Telehealth: Payer: Self-pay | Admitting: Family Medicine

## 2020-05-16 NOTE — Telephone Encounter (Signed)
Copied from [place] [id number]. Topic: General - Other >> May 15, 2020 12:59 PM Pawlus, Brayton Layman A wrote: Reason for CRM: Pt needed a doctors note, pt stated he is completely disabled and he needs this to provide to his employer. Pt stated he was approved for disability already [employer]

## 2020-05-21 NOTE — Telephone Encounter (Signed)
Pt is requesting letter.  Does patient need office visit for this matter.

## 2020-05-22 ENCOUNTER — Telehealth: Payer: Self-pay | Admitting: Family Medicine

## 2020-05-22 NOTE — Telephone Encounter (Signed)
Pt has dropped of paperwork for disability approval.

## 2020-05-22 NOTE — Telephone Encounter (Signed)
If he has paperwork to the effect of his approval for disability that will be helpful so we can scan into the chart and I can provide him the letter to his employer.

## 2020-05-22 NOTE — Telephone Encounter (Signed)
Pt has brought documents for PCP as per previous encounters. Pt states he needs the letter to be addressed to Mr. Glade Stanford (Pt's employer).   Please let pt know on My Chart to know when the letter is ready for pick up.  Pt wanted PCP to know and wrote a note: "The letter head has to say I am totally disabled, cannot work anymore like right now I have to have a hip replacement and another knee replacement of my LEFT knee, my right hip is rubbing bone to bone."   Please advise and thank you.

## 2020-05-22 NOTE — Telephone Encounter (Signed)
Pt will drop up approval for his disability today

## 2020-05-23 ENCOUNTER — Other Ambulatory Visit: Payer: Self-pay

## 2020-05-23 MED FILL — Lisinopril Tab 10 MG: ORAL | 30 days supply | Qty: 30 | Fill #0 | Status: AC

## 2020-05-23 MED FILL — Methocarbamol Tab 500 MG: ORAL | 10 days supply | Qty: 20 | Fill #0 | Status: AC

## 2020-05-23 MED FILL — Atorvastatin Calcium Tab 40 MG (Base Equivalent): ORAL | 30 days supply | Qty: 30 | Fill #0 | Status: AC

## 2020-05-23 NOTE — Telephone Encounter (Signed)
Duplicate message. 

## 2020-05-25 ENCOUNTER — Other Ambulatory Visit: Payer: Self-pay

## 2020-05-25 NOTE — Telephone Encounter (Signed)
Disability letter has been written.

## 2020-06-07 ENCOUNTER — Encounter (HOSPITAL_COMMUNITY): Payer: Self-pay | Admitting: Emergency Medicine

## 2020-06-07 ENCOUNTER — Emergency Department (HOSPITAL_BASED_OUTPATIENT_CLINIC_OR_DEPARTMENT_OTHER): Payer: BLUE CROSS/BLUE SHIELD

## 2020-06-07 ENCOUNTER — Telehealth: Payer: Self-pay

## 2020-06-07 ENCOUNTER — Emergency Department (HOSPITAL_COMMUNITY)
Admission: EM | Admit: 2020-06-07 | Discharge: 2020-06-07 | Disposition: A | Discharge status: Home / Self Care | Payer: BLUE CROSS/BLUE SHIELD | Attending: Emergency Medicine | Admitting: Emergency Medicine

## 2020-06-07 ENCOUNTER — Other Ambulatory Visit: Payer: Self-pay

## 2020-06-07 ENCOUNTER — Emergency Department (HOSPITAL_COMMUNITY): Payer: BLUE CROSS/BLUE SHIELD

## 2020-06-07 DIAGNOSIS — M7989 Other specified soft tissue disorders: Secondary | ICD-10-CM

## 2020-06-07 DIAGNOSIS — Z86718 Personal history of other venous thrombosis and embolism: Secondary | ICD-10-CM

## 2020-06-07 DIAGNOSIS — Z87891 Personal history of nicotine dependence: Secondary | ICD-10-CM | POA: Insufficient documentation

## 2020-06-07 DIAGNOSIS — R079 Chest pain, unspecified: Secondary | ICD-10-CM | POA: Diagnosis present

## 2020-06-07 DIAGNOSIS — I82531 Chronic embolism and thrombosis of right popliteal vein: Secondary | ICD-10-CM

## 2020-06-07 DIAGNOSIS — Z79899 Other long term (current) drug therapy: Secondary | ICD-10-CM | POA: Insufficient documentation

## 2020-06-07 DIAGNOSIS — R0602 Shortness of breath: Secondary | ICD-10-CM | POA: Diagnosis not present

## 2020-06-07 DIAGNOSIS — Z7984 Long term (current) use of oral hypoglycemic drugs: Secondary | ICD-10-CM | POA: Diagnosis not present

## 2020-06-07 DIAGNOSIS — F1010 Alcohol abuse, uncomplicated: Secondary | ICD-10-CM

## 2020-06-07 DIAGNOSIS — M791 Myalgia, unspecified site: Secondary | ICD-10-CM | POA: Insufficient documentation

## 2020-06-07 DIAGNOSIS — I10 Essential (primary) hypertension: Secondary | ICD-10-CM | POA: Insufficient documentation

## 2020-06-07 DIAGNOSIS — M79606 Pain in leg, unspecified: Secondary | ICD-10-CM | POA: Diagnosis not present

## 2020-06-07 DIAGNOSIS — E119 Type 2 diabetes mellitus without complications: Secondary | ICD-10-CM | POA: Insufficient documentation

## 2020-06-07 LAB — CBC
HCT: 40.3 % (ref 39.0–52.0)
Hemoglobin: 12.8 g/dL — ABNORMAL LOW (ref 13.0–17.0)
MCH: 27.9 pg (ref 26.0–34.0)
MCHC: 31.8 g/dL (ref 30.0–36.0)
MCV: 88 fL (ref 80.0–100.0)
Platelets: 291 10*3/uL (ref 150–400)
RBC: 4.58 MIL/uL (ref 4.22–5.81)
RDW: 14.7 % (ref 11.5–15.5)
WBC: 8 10*3/uL (ref 4.0–10.5)
nRBC: 0 % (ref 0.0–0.2)

## 2020-06-07 LAB — BASIC METABOLIC PANEL
Anion gap: 11 (ref 5–15)
BUN: 12 mg/dL (ref 6–20)
CO2: 26 mmol/L (ref 22–32)
Calcium: 8.8 mg/dL — ABNORMAL LOW (ref 8.9–10.3)
Chloride: 103 mmol/L (ref 98–111)
Creatinine, Ser: 0.95 mg/dL (ref 0.61–1.24)
GFR, Estimated: >60 mL/min (ref >60)
Glucose, Bld: 153 mg/dL — ABNORMAL HIGH (ref 70–99)
Potassium: 3.3 mmol/L — ABNORMAL LOW (ref 3.5–5.1)
Sodium: 140 mmol/L (ref 135–145)

## 2020-06-07 LAB — TROPONIN I (HIGH SENSITIVITY)
Troponin I (High Sensitivity): 9 ng/L (ref <18)
Troponin I (High Sensitivity): 8 ng/L (ref <18)

## 2020-06-07 LAB — ETHANOL: Alcohol, Ethyl (B): 124 mg/dL — ABNORMAL HIGH (ref <10)

## 2020-06-07 MED ORDER — ALBUTEROL SULFATE HFA 108 (90 BASE) MCG/ACT IN AERS
2.0000 | INHALATION_SPRAY | RESPIRATORY_TRACT | Status: DC | PRN
  Administered 2020-06-07: 2 via RESPIRATORY_TRACT
  Filled 2020-06-07: qty 6.7

## 2020-06-07 MED ORDER — ASPIRIN 81 MG PO CHEW
324.0000 mg | CHEWABLE_TABLET | Freq: Once | ORAL | Status: AC
  Administered 2020-06-07: 324 mg via ORAL
  Filled 2020-06-07: qty 4

## 2020-06-07 MED ORDER — OXYCODONE-ACETAMINOPHEN 5-325 MG PO TABS
1.0000 | ORAL_TABLET | Freq: Once | ORAL | Status: AC
  Administered 2020-06-07: 1 via ORAL
  Filled 2020-06-07: qty 1

## 2020-06-07 NOTE — ED Triage Notes (Signed)
Pt states he's aching all over since last night. midsternal chest pain radiating to right arm. Denies any nausea and vomiting.

## 2020-06-07 NOTE — Progress Notes (Signed)
Bilateral lower extremity venous duplex completed. Refer to "CV Proc" under chart review to view preliminary results.  06/07/2020 10:45 AM Kelby Aline., MHA, RVT, RDCS, RDMS

## 2020-06-07 NOTE — Discharge Instructions (Addendum)
The chronic blood clot in your right lower extremity appears to be unchanged.  If you are having worsening pain, swelling, please recheck with your primary care doctor who can compare today's studies to previous advise regarding reinstitution of blood thinners Return to the emergency department immediately if you have worsening chest pain or shortness of breath. Please avoid alcohol use.
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
## 2020-06-07 NOTE — ED Provider Notes (Signed)
  Physical Exam  BP (!) 137/92   Pulse 84   Temp 97.9 F (36.6 C) (Oral)   Resp 20   Ht 1.753 m (5\' 9" )   Wt 104.6 kg   SpO2 91%   BMI 34.05 kg/m   Physical Exam  ED Course/Procedures     Procedures  MDM  Patient care received from Dr. Christy Gentles. Patient complaining of diffuse body aches and chest pain. Chest x-Terry Poole, EKG, and troponins are normal. Patient reports he has had similar symptoms with DVTs and is pending DVT study. Plan is for patient to be discharged if DVT study is negative.  From preliminary read:  Summary:  RIGHT:  - Findings consistent with chronic deep vein thrombosis involving the  right popliteal vein.  - There is no evidence of acute deep vein thrombosis in the lower  extremity.    - No cystic structure found in the popliteal fossa.    LEFT:  - There is no evidence of deep vein thrombosis in the lower extremity.    - No cystic structure found in the popliteal fossa.     *See table(s) above for measurements and observations.   From hem/onc note 04/22/19 1.  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  He was found to have right popliteal vein thrombosis as well as bilateral pulmonary emboli involving the lower lobe segmental and subsegmental branches.  This was in the setting of a recent hospitalization and immobilization and appears to be provoked.   He has been on Xarelto since that time has had multiple imaging studies of the chest and February 2020, June 2020, September 2020 and recently in March 2021.  He also ultrasound Doppler of her lower extremity which showed chronic thrombosis with recannulization of his popliteal vein.  He did have episodic mild bleeding since that time.   Treatment options and the natural course of venous thromboembolism was reviewed.  His thrombosis appeared to be provoked because of acute illness and immobilization which led to DVT and PE.  At that time 6 months of anticoagulation may be reasonable and he had  received longer treatment than that.  I do not feel continuous anticoagulation is needed given the fact that his popliteal vein thrombosis appears to be chronic and recannulization has occurred.   I recommended discontinuation of Xarelto at this time.  He understands he might require lifetime anticoagulation if any additional thrombosis occurs in the future.  I do not think his knee pain or leg pain in general is related to this thrombosis  Discussed results with patient.  It appears that this is an ongoing chronic DVT in his right lower extremity.  His pain today does not appear to be associated with this.  He has chronic pain in bilateral lower extremities. I have advised patient that he should follow-up with his primary care doctor has been following for this and return if he has any worsening pain, swelling, chest pain, or shortness of breath.   Terry Boss, MD 06/07/20 1102

## 2020-06-07 NOTE — ED Provider Notes (Signed)
[facility] EMERGENCY DEPARTMENT Provider Note   CSN: [id number] Arrival date & time: 06/07/20  0401     History Chief Complaint  Patient presents with  . Generalized Body Aches  . Chest Pain    Terry Poole is a 55 y.o. male.  The history is provided by the patient.  Chest Pain Pain location:  L chest Pain quality: aching and pressure   Associated symptoms: shortness of breath   Associated symptoms: no fever and no vomiting     HPI: A 55 year old patient with a history of treated diabetes, hypertension and obesity presents for evaluation of chest pain. Initial onset of pain was more than 6 hours ago. The patient's chest pain is described as heaviness/pressure/tightness and is not worse with exertion. The patient's chest pain is middle- or left-sided, is not well-localized, is not sharp and does not radiate to the arms/jaw/neck. The patient does not complain of nausea and denies diaphoresis. The patient has no history of stroke, has no history of peripheral artery disease, has not smoked in the past 90 days, has no relevant family history of coronary artery disease (first degree relative at less than age 42) and has no history of hypercholesterolemia.  Patient reports chest pain for several weeks.  Reports it is left-sided and aching and pressure at times.  He reports he is under a lot of stress and that makes it worse.  No fevers or vomiting.  He does report some shortness of breath.  No pleuritic pain. Patient also reports bilateral leg pain and swelling.  Patient reports previous history of DVT but is no longer on anticoagulation. Past Medical History:  Diagnosis Date  . Bronchitis   . Bronchitis   . DVT (deep venous thrombosis) ([place])   . Gout   . Hammertoe of second toe of right foot   . Smoker     Patient Active Problem List   Diagnosis Date Noted  . Total knee replacement status, right 04/14/2019  . Osteoarthritis of right knee 12/10/2017  . DVT (deep  venous thrombosis) ([place]) 10/13/2017  . Pulmonary embolism ([place]) 10/02/2017  . AKI (acute kidney injury) ([place]) 09/22/2017  . Chest pain 09/22/2017  . Hypertension 11/19/2016  . Type 2 diabetes mellitus ([place]) 08/19/2016  . Hyperlipidemia 08/19/2016  . Back pain 05/13/2016  . Hammertoe of second toe of right foot 03/06/2016  . Onychomycosis 04/16/2015  . Pseudofolliculitis barbae 20/94/7096  . Gout 03/28/2015    Past Surgical History:  Procedure Laterality Date  . HAMMER TOE SURGERY Right 03/10/2016   Procedure: 2ND RIGHT HAMMER TOE CORRECTION;  Surgeon: Landis Martins, DPM;  Location: [facility];  Service: Podiatry;  Laterality: Right;  . MASS EXCISION Right 03/10/2016   Procedure: EXCISION OF CALLUS 2ND RIGHT TOE;  Surgeon: Landis Martins, DPM;  Location: [facility];  Service: Podiatry;  Laterality: Right;  . NO PAST SURGERIES         Family History  Problem Relation Age of Onset  . Diabetes Mother   . Healthy Father   . Heart disease Neg Hx   . Kidney disease Neg Hx     Social History   Tobacco Use  . Smoking status: Former Smoker    Packs/day: 0.25    Types: Cigarettes    Quit date: 01/25/2019    Years since quitting: 1.3  . Smokeless tobacco: Never Used  . Tobacco comment: 8 cigs daily  Vaping Use  . Vaping Use: Never used  Substance Use Topics  . Alcohol use: No  . Drug use: No    Home Medications Prior to Admission medications   Medication Sig Start Date End Date Taking? Authorizing Provider  allopurinol (ZYLOPRIM) 300 MG tablet TAKE 1 TABLET (300 MG TOTAL) BY MOUTH DAILY. 10/25/19 10/24/20  [name], MD  amoxicillin (AMOXIL) 500 MG capsule TAKE 1 CAPSULE (500 MG TOTAL) BY MOUTH 3 (THREE) TIMES DAILY. 03/21/20 03/21/21  [name], MD  atorvastatin (LIPITOR) 40 MG tablet TAKE 1 TABLET (40 MG TOTAL) BY MOUTH DAILY. 10/25/19 10/24/20  [name], MD  Colchicine (MITIGARE) 0.6 MG CAPS TAKE 1 CAPSULE (0.6 MG) BY MOUTH AT THE  ONSET OF A GOUT ATTACK, MAY REPEAT 1 CAPSULE IN 2 HOURS IF SYMPTOMS PERSIST 10/25/19   [name], MD  DULoxetine (CYMBALTA) 60 MG capsule TAKE 1 CAPSULE (60 MG TOTAL) BY MOUTH DAILY. FOR BACK 01/19/20 01/18/21  [name], MD  fluticasone (FLONASE) 50 MCG/ACT nasal spray Place 2 sprays into both nostrils daily as needed for allergies. 01/13/19   Ladell Pier, MD  iron polysaccharides (NIFEREX) 150 MG capsule Take by mouth. 01/08/19   [provider]  lidocaine (LIDODERM) 5 % Place 1 patch onto the skin daily. Remove & Discard patch within 12 hours or as directed by MD 01/19/20   [name], MD  lisinopril (ZESTRIL) 10 MG tablet TAKE 1 TABLET (10 MG TOTAL) BY MOUTH DAILY. 10/25/19 10/24/20  [name], MD  metFORMIN (GLUCOPHAGE) 500 MG tablet TAKE 2 TABLETS (1,000 MG TOTAL) BY MOUTH 2 (TWO) TIMES DAILY WITH A MEAL. 10/25/19 10/24/20  [name], MD  methocarbamol (ROBAXIN) 500 MG tablet TAKE 1 TABLET (500 MG TOTAL) BY MOUTH 2 (TWO) TIMES DAILY. 03/12/20 03/12/21  [name], MD  nortriptyline (PAMELOR) 25 MG capsule TAKE 1 CAPSULE (25 MG TOTAL) BY MOUTH AT BEDTIME. 01/30/20 01/29/21  Pieter Partridge, DO  oxyCODONE-acetaminophen (PERCOCET/ROXICET) 5-325 MG tablet Take 1 tablet by mouth every 8 (eight) hours as needed for severe pain. 03/28/20   Rayna Sexton, PA-C  pantoprazole (PROTONIX) 40 MG tablet TAKE 1 TABLET (40 MG TOTAL) BY MOUTH 2 (TWO) TIMES DAILY. 10/25/19 10/24/20  [name], MD  sildenafil (VIAGRA) 50 MG tablet TAKE 1 TABLET BY MOUTH DAILY AS NEEDED FOR ERECTILE DYSFUNCTION. AT LEAST 24 HOURS BETWEEN DOSES 12/09/19   [name], MD  tiZANidine (ZANAFLEX) 4 MG tablet TAKE 1 TABLET (4 MG TOTAL) BY MOUTH EVERY 8 (EIGHT) HOURS AS NEEDED FOR MUSCLE SPASMS. 03/12/20 03/12/21  [name], MD  topiramate (TOPAMAX) 100 MG tablet TAKE 1 TABLET (100 MG TOTAL) BY MOUTH 2 (TWO) TIMES DAILY. 10/25/19 10/24/20  [name], MD  FLUoxetine (PROZAC) 20 MG tablet  Take 1 tablet (20 mg total) by mouth daily. 10/25/19 01/30/20  [name], MD    Allergies    Vicodin [hydrocodone-acetaminophen]  Review of Systems   Review of Systems  Constitutional: Negative for fever.  Respiratory: Positive for shortness of breath.   Cardiovascular: Positive for chest pain.  Gastrointestinal: Negative for vomiting.  Musculoskeletal: Positive for arthralgias.  All other systems reviewed and are negative.   Physical Exam Updated Vital Signs BP (!) 137/92   Pulse 84   Temp 97.9 F (36.6 C) (Oral)   Resp 20   Ht 1.753 m (5\' 9" )   Wt 104.6 kg   SpO2 91%   BMI 34.05 kg/m   Physical Exam CONSTITUTIONAL: Well developed/well nourished HEAD: Normocephalic/atraumatic EYES: EOMI/PERRL ENMT: Mucous membranes moist NECK: supple no  meningeal signs SPINE/BACK:entire spine nontender CV: S1/S2 noted, no murmurs/rubs/gallops noted LUNGS: Lungs are clear to auscultation bilaterally, no apparent distress ABDOMEN: soft, nontender, no rebound or guarding, bowel sounds noted throughout abdomen GU:no cva tenderness NEURO: Pt is awake/alert/appropriate, moves all extremitiesx4.  No facial droop.  Patient is slurring his words EXTREMITIES: pulses normal/equal, full ROM, symmetric mild pitting edema to bilateral lower extremities.  Distal pulses intact.  No erythema.  Mild calf tenderness noted SKIN: warm, color normal PSYCH: Mildly anxious  ED Results / Procedures / Treatments   Labs (all labs ordered are listed, but only abnormal results are displayed) Labs Reviewed  BASIC METABOLIC PANEL - Abnormal; Notable for the following components:      Result Value   Potassium 3.3 (*)    Glucose, Bld 153 (*)    Calcium 8.8 (*)    All other components within normal limits  CBC - Abnormal; Notable for the following components:   Hemoglobin 12.8 (*)    All other components within normal limits  ETHANOL - Abnormal; Notable for the following components:   Alcohol, Ethyl  (B) 124 (*)    All other components within normal limits  TROPONIN I (HIGH SENSITIVITY)  TROPONIN I (HIGH SENSITIVITY)    EKG EKG Interpretation  Date/Time:  Thursday Jun 07 2020 04:07:23 EDT Ventricular Rate:  100 PR Interval:  170 QRS Duration: 112 QT Interval:  384 QTC Calculation: 495 R Axis:   60 Text Interpretation: Normal sinus rhythm Cannot rule out Anterior infarct , age undetermined Abnormal ECG Confirmed by Ripley Fraise [id number]) on 06/07/2020 5:29:22 AM   Radiology DG Chest 2 View  Result Date: 06/07/2020 CLINICAL DATA:  Chest pain EXAM: CHEST - 2 VIEW COMPARISON:  10/21/2018 FINDINGS: Normal heart size and mediastinal contours. No acute infiltrate or edema. No effusion or pneumothorax. No acute osseous findings. IMPRESSION: No active cardiopulmonary disease. Electronically Signed   By: Monte Fantasia M.D.   On: 06/07/2020 04:47    Procedures Procedures   Medications Ordered in ED Medications  aspirin chewable tablet 324 mg (324 mg Oral Given 06/07/20 0554)  oxyCODONE-acetaminophen (PERCOCET/ROXICET) 5-325 MG per tablet 1 tablet (1 tablet Oral Given 06/07/20 0554)    ED Course  I have reviewed the triage vital signs and the nursing notes.  Pertinent labs & imaging results that were available during my care of the patient were reviewed by me and considered in my medical decision making (see chart for details).    MDM Rules/Calculators/A&P HEAR Score: 4                       7:06 AM  Patient presents for multiple complaints.  Patient reports has had chest pain for weeks.  Also reports diffuse body aches.  Also mentions arthralgias and leg swelling.  Patient reports he is concerned he may have a DVT. Initial cardiac work-up was unrevealing.  Patient denies pleuritic pain to suggest PE.  However patient is requesting DVT study.  Bilateral lower extremity DVT study has been ordered Signed out to Dr. Jeanell Sparrow at shift change Final Clinical Impression(s) / ED  Diagnoses Final diagnoses:  None    Rx / DC Orders ED Discharge Orders    None       Ripley Fraise, MD 06/07/20 [phone]

## 2020-06-29 ENCOUNTER — Other Ambulatory Visit: Payer: Self-pay

## 2020-06-29 ENCOUNTER — Ambulatory Visit: Payer: No Typology Code available for payment source | Attending: Family Medicine | Admitting: Internal Medicine

## 2020-06-29 ENCOUNTER — Encounter: Payer: Self-pay | Admitting: Internal Medicine

## 2020-06-29 ENCOUNTER — Other Ambulatory Visit: Payer: Self-pay | Admitting: Family Medicine

## 2020-06-29 VITALS — BP 134/90 | HR 97 | Resp 16 | Wt 236.2 lb

## 2020-06-29 DIAGNOSIS — E669 Obesity, unspecified: Secondary | ICD-10-CM

## 2020-06-29 DIAGNOSIS — E1159 Type 2 diabetes mellitus with other circulatory complications: Secondary | ICD-10-CM

## 2020-06-29 DIAGNOSIS — M1712 Unilateral primary osteoarthritis, left knee: Secondary | ICD-10-CM | POA: Diagnosis not present

## 2020-06-29 DIAGNOSIS — M545 Low back pain, unspecified: Secondary | ICD-10-CM

## 2020-06-29 DIAGNOSIS — I152 Hypertension secondary to endocrine disorders: Secondary | ICD-10-CM

## 2020-06-29 DIAGNOSIS — K029 Dental caries, unspecified: Secondary | ICD-10-CM

## 2020-06-29 DIAGNOSIS — E1169 Type 2 diabetes mellitus with other specified complication: Secondary | ICD-10-CM | POA: Diagnosis not present

## 2020-06-29 DIAGNOSIS — G8929 Other chronic pain: Secondary | ICD-10-CM

## 2020-06-29 LAB — GLUCOSE, POCT (MANUAL RESULT ENTRY): POC Glucose: 143 mg/dl — AB (ref 70–99)

## 2020-06-29 LAB — POCT GLYCOSYLATED HEMOGLOBIN (HGB A1C): HbA1c, POC (controlled diabetic range): 8.3 % — AB (ref 0.0–7.0)

## 2020-06-29 MED ORDER — DULOXETINE HCL 60 MG PO CPEP
ORAL_CAPSULE | ORAL | 3 refills | Status: DC
  Filled 2020-06-29: qty 30, 30d supply, fill #0

## 2020-06-29 MED ORDER — DICLOFENAC SODIUM 1 % EX GEL
2.0000 g | Freq: Four times a day (QID) | CUTANEOUS | 3 refills | Status: AC
  Filled 2020-06-29: qty 100, 12d supply, fill #0

## 2020-06-29 MED ORDER — LISINOPRIL 10 MG PO TABS
15.0000 mg | ORAL_TABLET | Freq: Every day | ORAL | 1 refills | Status: DC
  Filled 2020-06-29: qty 135, 90d supply, fill #0

## 2020-06-29 MED ORDER — METHOCARBAMOL 750 MG PO TABS
750.0000 mg | ORAL_TABLET | Freq: Two times a day (BID) | ORAL | 1 refills | Status: DC | PRN
  Filled 2020-06-29: qty 60, 30d supply, fill #0

## 2020-06-29 MED ORDER — AMOXICILLIN 500 MG PO CAPS
ORAL_CAPSULE | Freq: Three times a day (TID) | ORAL | 0 refills | Status: AC
  Filled 2020-06-29: qty 21, 7d supply, fill #0

## 2020-06-29 MED ORDER — METFORMIN HCL 1000 MG PO TABS
1000.0000 mg | ORAL_TABLET | Freq: Two times a day (BID) | ORAL | 3 refills | Status: DC
  Filled 2020-06-29: qty 180, 90d supply, fill #0

## 2020-06-29 MED FILL — Nortriptyline HCl Cap 25 MG: ORAL | 30 days supply | Qty: 30 | Fill #0 | Status: AC

## 2020-06-29 MED FILL — Allopurinol Tab 300 MG: ORAL | 30 days supply | Qty: 30 | Fill #0 | Status: AC

## 2020-06-29 NOTE — Progress Notes (Signed)
Pt is requesting a rx for Amoxicillin because he is about to have some mouth work done   Pt is needing a refill on Cymbalta, Robaxin abd Zanaflex

## 2020-06-29 NOTE — Progress Notes (Signed)
Patient ID: Terry Poole, male    DOB: April 13, 1965  MRN: [id number]  CC: Diabetes, Hypertension, Headache, and Medication Refill   Subjective: Terry Poole is a 55 y.o. male who presents for chronic ds management.  PCP is Dr. Margarita Rana His concerns today include:  Pt with hx of DM, HTN, OA knees, HL, PE  HTN:  BP elev today.In pain today Compliant with Lisinopril 10 mg and took already this a.m. he limits salt in the foods.  He feels his blood pressure is elevated today because he is in pain from arthritis in his left knee. Checks BP every morning.  Reports # have been good but does not recall any of his readings.  No chest pains or shortness of breath.  No lower extremity edema.  OA LT knee:  In pain today.  He tells me that he has bone-on-bone and needs to have total knee replacement.  He was seeing an orthopedic specialist at [facility] orthopedics.  He plans to call them today to schedule a follow-up appointment to discuss having knee replacement surgery.  Requests refill on Cymbalta, Zanaflex and Robaxin which he takes for chronic back pain  DM: Results for orders placed or performed in visit on 06/29/20  POCT glucose (manual entry)  Result Value Ref Range   POC Glucose 143 (A) 70 - 99 mg/dl  POCT glycosylated hemoglobin (Hb A1C)  Result Value Ref Range   Hemoglobin A1C     HbA1c POC (<> result, manual entry)     HbA1c, POC (prediabetic range)     HbA1c, POC (controlled diabetic range) 8.3 (A) 0.0 - 7.0 %   -reports compliant with Metformin.  However he is supposed to be on 500 mg 2 tabs BID but has been taking only 500 mg BID Checks BS once a day.  Reports it has been nl but he can not recall readings.  No low readings Reports he can do better with eating habits. Never saw a nutritionist Has a stationary paddle device which he uses daily Overdue for eye exam.  HL:  Taking and tolerating Lipitor.   Will have dental procedure 07/12/2020.   States he was told by dentist  that he has infection currently and needs to be placed on abx before his upcoming appointment.  He has a decayed tooth and is having a lot of pain in that tooth.  He has not had any swelling of the jaw. Patient Active Problem List   Diagnosis Date Noted  . Total knee replacement status, right 04/14/2019  . Osteoarthritis of right knee 12/10/2017  . DVT (deep venous thrombosis) ([place]) 10/13/2017  . Pulmonary embolism ([place]) 10/02/2017  . AKI (acute kidney injury) ([place]) 09/22/2017  . Chest pain 09/22/2017  . Hypertension 11/19/2016  . Type 2 diabetes mellitus ([place]) 08/19/2016  . Hyperlipidemia 08/19/2016  . Back pain 05/13/2016  . Hammertoe of second toe of right foot 03/06/2016  . Onychomycosis 04/16/2015  . 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 Gout 03/28/2015     Current Outpatient Medications on File Prior to Visit  Medication Sig Dispense Refill  . allopurinol (ZYLOPRIM) 300 MG tablet TAKE 1 TABLET (300 MG TOTAL) BY MOUTH DAILY. 30 tablet 6  . atorvastatin (LIPITOR) 40 MG tablet TAKE 1 TABLET (40 MG TOTAL) BY MOUTH DAILY. 30 tablet 6  . Colchicine (MITIGARE) 0.6 MG CAPS TAKE 1 CAPSULE (0.6 MG) BY MOUTH AT THE ONSET OF A GOUT ATTACK, MAY REPEAT 1 CAPSULE IN 2 HOURS IF SYMPTOMS  PERSIST (Patient not taking: Reported on 06/29/2020) 30 capsule 1  . iron polysaccharides (NIFEREX) 150 MG capsule Take by mouth.    . lidocaine (LIDODERM) 5 % Place 1 patch onto the skin daily. Remove & Discard patch within 12 hours or as directed by MD 30 patch 1  . nortriptyline (PAMELOR) 25 MG capsule TAKE 1 CAPSULE (25 MG TOTAL) BY MOUTH AT BEDTIME. 30 capsule 5  . oxyCODONE-acetaminophen (PERCOCET/ROXICET) 5-325 MG tablet Take 1 tablet by mouth every 8 (eight) hours as needed for severe pain. 6 tablet 0  . sildenafil (VIAGRA) 50 MG tablet TAKE 1 TABLET BY MOUTH DAILY AS NEEDED FOR ERECTILE DYSFUNCTION. AT LEAST 24 HOURS BETWEEN DOSES (Patient not taking: Reported on 06/29/2020) 4 tablet 2  . [DISCONTINUED]  FLUoxetine (PROZAC) 20 MG tablet Take 1 tablet (20 mg total) by mouth daily. 30 tablet 6   No current facility-administered medications on file prior to visit.    Allergies  Allergen Reactions  . Vicodin [Hydrocodone-Acetaminophen] Hives    Patient stated he is no longer allergic to vicodin, patient stated that he was allergic about 12 years ago.     Social History   Socioeconomic History  . Marital status: Single    Spouse name: Not on file  . Number of children: Not on file  . Years of education: Not on file  . Highest education level: Not on file  Occupational History  . Not on file  Tobacco Use  . Smoking status: Former Smoker    Packs/day: 0.25    Types: Cigarettes    Quit date: 01/25/2019    Years since quitting: 1.4  . Smokeless tobacco: Never Used  . Tobacco comment: 8 cigs daily  Vaping Use  . Vaping Use: Never used  Substance and Sexual Activity  . Alcohol use: No  . Drug use: No  . Sexual activity: Yes  Other Topics Concern  . Not on file  Social History Narrative   Right handed   Social Determinants of Health   Financial Resource Strain: Not on file  Food Insecurity: Not on file  Transportation Needs: Not on file  Physical Activity: Not on file  Stress: Not on file  Social Connections: Not on file  Intimate Partner Violence: Not on file    Family History  Problem Relation Age of Onset  . Diabetes Mother   . Healthy Father   . Heart disease Neg Hx   . Kidney disease Neg Hx     Past Surgical History:  Procedure Laterality Date  . HAMMER TOE SURGERY Right 03/10/2016   Procedure: 2ND RIGHT HAMMER TOE CORRECTION;  Surgeon: Landis Martins, DPM;  Location: [facility];  Service: Podiatry;  Laterality: Right;  . MASS EXCISION Right 03/10/2016   Procedure: EXCISION OF CALLUS 2ND RIGHT TOE;  Surgeon: Landis Martins, DPM;  Location: [facility];  Service: Podiatry;  Laterality: Right;  . NO PAST SURGERIES      ROS: Review  of Systems Negative except as stated above  PHYSICAL EXAM: BP 134/90   Pulse 97   Resp 16   Wt 236 lb 3.2 oz (107.1 kg)   SpO2 95%   BMI 34.88 kg/m   Physical Exam  General appearance - alert, well appearing, middle-aged African-American male and in no distress Mental status - normal mood, behavior, speech, dress, motor activity, and thought processes Mouth: Decayed right lower molar/wisdom tooth.  Mild erythema of the gum around this area. Neck - supple, no  significant adenopathy Chest - clear to auscultation, no wheezes, rales or rhonchi, symmetric air entry Heart - normal rate, regular rhythm, normal S1, S2, no murmurs, rubs, clicks or gallops Musculoskeletal -left knee: Mild enlargement of the joint.  Positive crepitus medially on passive range of motion. Extremities - peripheral pulses normal, no pedal edema, no clubbing or cyanosis Diabetic Foot Exam - Simple   Simple Foot Form Visual Inspection See comments: Yes Sensation Testing Intact to touch and monofilament testing bilaterally: Yes Pulse Check Posterior Tibialis and Dorsalis pulse intact bilaterally: Yes Comments Flat-footed      CMP Latest Ref Rng & Units 06/07/2020 10/25/2019 04/16/2019  Glucose 70 - 99 mg/dL 153(H) 115(H) 186(H)  BUN 6 - 20 mg/dL 12 11 15   Creatinine 0.61 - 1.24 mg/dL 0.95 1.01 1.20  Sodium 135 - 145 mmol/L 140 139 141  Potassium 3.5 - 5.1 mmol/L 3.3(L) 4.0 4.4  Chloride 98 - 111 mmol/L 103 108(H) 109  CO2 22 - 32 mmol/L 26 20 19(L)  Calcium 8.9 - 10.3 mg/dL 8.8(L) 9.3 9.0  Total Protein 6.5 - 8.1 g/dL - - 7.1  Total Bilirubin 0.3 - 1.2 mg/dL - - 0.5  Alkaline Phos 38 - 126 U/L - - 100  AST 15 - 41 U/L - - 32  ALT 0 - 44 U/L - - 61(H)   Lipid Panel     Component Value Date/Time   CHOL 128 03/14/2019 1410   TRIG 330 (H) 03/14/2019 1410   HDL 44 03/14/2019 1410   CHOLHDL 2.9 03/14/2019 1410   CHOLHDL 2.6 09/21/2015 1053   VLDL 47 (H) 09/21/2015 1053   LDLCALC 36 03/14/2019 1410     CBC    Component Value Date/Time   WBC 8.0 06/07/2020 0417   RBC 4.58 06/07/2020 0417   HGB 12.8 (L) 06/07/2020 0417   HGB 13.0 03/18/2019 1410   HCT 40.3 06/07/2020 0417   HCT 39.8 03/18/2019 1410   PLT 291 06/07/2020 0417   PLT 300 03/18/2019 1410   MCV 88.0 06/07/2020 0417   MCV 85 03/18/2019 1410   MCH 27.9 06/07/2020 0417   MCHC 31.8 06/07/2020 0417   RDW 14.7 06/07/2020 0417   RDW 14.0 03/18/2019 1410   LYMPHSABS 3.6 (H) 03/18/2019 1410   MONOABS 0.3 09/22/2018 1156   EOSABS 0.2 03/18/2019 1410   BASOSABS 0.0 03/18/2019 1410    ASSESSMENT AND PLAN:  1. Type 2 diabetes mellitus with obesity ([place]) Not at goal.  Discussed healthy eating habits.  Increase metformin to 1000 mg twice a day.  Encouraged him to move as much as his knees will allow. - POCT glucose (manual entry) - POCT glycosylated hemoglobin (Hb A1C) - Microalbumin / creatinine urine ratio - Ambulatory referral to Ophthalmology - metFORMIN (GLUCOPHAGE) 1000 MG tablet; Take 1 tablet (1,000 mg total) by mouth 2 (two) times daily with a meal.  Dispense: 180 tablet; Refill: 3  2. Hypertension associated with diabetes ([place]) Not at goal.  DASH diet encouraged.  Increase lisinopril to 15 mg daily - lisinopril (ZESTRIL) 10 MG tablet; Take 1.5 tablets (15 mg total) by mouth daily.  Dispense: 135 tablet; Refill: 1  3. Primary osteoarthritis of left knee Patient to follow-up with orthopedics as we outlined above.  Refill Cymbalta.  Prescription given for Voltaren gel.  Discussed the importance of weight loss. - DULoxetine (CYMBALTA) 60 MG capsule; TAKE 1 CAPSULE (60 MG TOTAL) BY MOUTH DAILY. FOR BACK  Dispense: 30 capsule; Refill: 3 - diclofenac Sodium (VOLTAREN)  1 % GEL; Apply 2 g topically 4 (four) times daily.  Dispense: 100 g; Refill: 3  4. Chronic midline low back pain without sciatica - DULoxetine (CYMBALTA) 60 MG capsule; TAKE 1 CAPSULE (60 MG TOTAL) BY MOUTH DAILY. FOR BACK  Dispense: 30 capsule; Refill:  3 - methocarbamol (ROBAXIN-750) 750 MG tablet; Take 1 tablet (750 mg total) by mouth 2 (two) times daily as needed for muscle spasms.  Dispense: 60 tablet; Refill: 1  5. Pain due to dental caries - amoxicillin (AMOXIL) 500 MG capsule; TAKE 1 CAPSULE (500 MG TOTAL) BY MOUTH 3 (THREE) TIMES DAILY.  Dispense: 21 capsule; Refill: 0   Patient was given the opportunity to ask questions.  Patient verbalized understanding of the plan and was able to repeat key elements of the plan.   Orders Placed This Encounter  Procedures  . Microalbumin / creatinine urine ratio  . Ambulatory referral to Ophthalmology  . POCT glucose (manual entry)  . POCT glycosylated hemoglobin (Hb A1C)     Requested Prescriptions   Signed Prescriptions Disp Refills  . DULoxetine (CYMBALTA) 60 MG capsule 30 capsule 3    Sig: TAKE 1 CAPSULE (60 MG TOTAL) BY MOUTH DAILY. FOR BACK  . metFORMIN (GLUCOPHAGE) 1000 MG tablet 180 tablet 3    Sig: Take 1 tablet (1,000 mg total) by mouth 2 (two) times daily with a meal.  . methocarbamol (ROBAXIN-750) 750 MG tablet 60 tablet 1    Sig: Take 1 tablet (750 mg total) by mouth 2 (two) times daily as needed for muscle spasms.  . diclofenac Sodium (VOLTAREN) 1 % GEL 100 g 3    Sig: Apply 2 g topically 4 (four) times daily.  Mar[place] Kitchen amoxicillin (AMOXIL) 500 MG capsule 21 capsule 0    Sig: TAKE 1 CAPSULE (500 MG TOTAL) BY MOUTH 3 (THREE) TIMES DAILY.  Marland Kitchen lisinopril (ZESTRIL) 10 MG tablet 135 tablet 1    Sig: Take 1.5 tablets (15 mg total) by mouth daily.    Return in about 3 months (around 09/29/2020) for Give 3 mth f/u with Dr. Margarita Rana.  Karle Plumber, MD, FACP

## 2020-06-29 NOTE — Telephone Encounter (Signed)
Requested medication (s) are due for refill today: yes  Requested medication (s) are on the active medication list: yes  Last refill:  03/12/20-03/12/21 #20 2 refills  Future visit scheduled: today  Notes to clinic:  not delegated per protocol     Requested Prescriptions  Pending Prescriptions Disp Refills   methocarbamol (ROBAXIN) 500 MG tablet 20 tablet 2    Sig: TAKE 1 TABLET (500 MG TOTAL) BY MOUTH 2 (TWO) TIMES DAILY.      Not Delegated - Analgesics:  Muscle Relaxants Failed - 06/29/2020 10:58 AM      Failed - This refill cannot be delegated      Passed - Valid encounter within last 6 months    Recent Outpatient Visits           5 months ago Chronic midline low back pain without sciatica   [place], [place], MD   8 months ago Type 2 diabetes mellitus with other specified complication, without long-term current use of insulin ([place])   [place], [place], MD   10 months ago Chronic right-sided low back pain without sciatica   [place], [place], MD   11 months ago Type 2 diabetes mellitus without complication, without long-term current use of insulin ([place])   [place], Charlane Ferretti, MD   1 year ago Type 2 diabetes mellitus without complication, without long-term current use of insulin [facility])   [place], Charlane Ferretti, MD       Future Appointments             Today Ladell Pier, MD [place]

## 2020-06-29 NOTE — Patient Instructions (Signed)
Your blood pressure is not at goal.  Increase lisinopril to 1-1/2 tablets daily. Your diabetes is not at goal.  We have increase metformin to 1000 mg twice a day.

## 2020-06-30 LAB — MICROALBUMIN / CREATININE URINE RATIO
Creatinine, Urine: 169.5 mg/dL
Microalb/Creat Ratio: 26 mg/g creat (ref 0–29)
Microalbumin, Urine: 43.3 ug/mL

## 2020-07-03 ENCOUNTER — Other Ambulatory Visit: Payer: Self-pay

## 2020-07-11 ENCOUNTER — Other Ambulatory Visit: Payer: Self-pay | Admitting: Family Medicine

## 2020-07-11 ENCOUNTER — Other Ambulatory Visit: Payer: Self-pay

## 2020-07-13 ENCOUNTER — Telehealth: Payer: Self-pay | Admitting: Family Medicine

## 2020-07-13 NOTE — Telephone Encounter (Signed)
Paperwork has been completed and will be faxed over to office.

## 2020-07-13 NOTE — Telephone Encounter (Signed)
Pt is calling to ask if the dentist has sent an approval to being dental work on the pt because he had a knee replacement. Pt states he will call back with the name and contact information of the dentist. Please advise. Cb- [phone]

## 2020-07-23 ENCOUNTER — Other Ambulatory Visit: Payer: Self-pay

## 2020-07-23 MED FILL — Pantoprazole Sodium EC Tab 40 MG (Base Equiv): ORAL | 30 days supply | Qty: 60 | Fill #0 | Status: AC

## 2020-07-26 ENCOUNTER — Other Ambulatory Visit: Payer: Self-pay

## 2020-07-29 NOTE — Progress Notes (Deleted)
NEUROLOGY FOLLOW UP OFFICE NOTE  Terry Poole [id number]  Assessment/Plan:   ***  Subjective:  Terry Poole is a 55 year old right-handed male with type 2 diabetes mellitus, gout, HTN, depression, s/p R TKR and history of bilateral PE and DVT who follows up for headache.  UPDATE: Switched from fluoxetine to nortriptyline in December. Intensity:  *** Duration:  *** Frequency:  *** Frequency of abortive medication: *** Current NSAIDS/analgesics:  Tylenol (daily), tramadol (for back pain but has ran out) Current triptans:  none Current ergotamine:  none Current anti-emetic:  none Current muscle relaxants:  Tizanidine 4mg  daily (back pain) Current Antihypertensive medications:  lisinopril Current Antidepressant medications:  Nortriptyline 25mg  QHS, Cymbalta 60mg  daily (for back pain) Current Anticonvulsant medications:  topiramate 100mg  BID Current anti-CGRP:  none Current Vitamins/Herbal/Supplements:  none Current Antihistamines/Decongestants:  none Other therapy:  none Hormone/birth control:  none Other medications:  none  Caffeine:  *** Alcohol:  *** Smoker:  *** Diet:  *** Exercise:  *** Depression:  ***; Anxiety:  *** Other pain:  *** Sleep hygiene:  ***  HISTORY:  Headaches started at age 50 after head injury.  Required steel plate and was told that he had brain tissue removed and would have brain damage.  He has a persistent daily headaches a moderate throbbing headache across the back of his head. No associated visual disturbance, nausea, vomiting, photophobia, phonophobia, numbness or weakness.   Due to worsening headaches, he had a CT head without contrast on 11/23/2019 which was personally reviewed and was unremarkable.   He was started on fluoxetine and topiramate which have been ineffective.      Past NSAIDS/analgesics:  Ibuprofen, Excedrin, naproxen Past abortive triptans:  none Past abortive ergotamine:  none Past muscle relaxants:  none Past  anti-emetic:  none Past antihypertensive medications:  none Past antidepressant medications:  fluoxetine Past anticonvulsant medications:  gabapentin Past anti-CGRP:  none Past vitamins/Herbal/Supplements:  none Past antihistamines/decongestants:  none Other past therapies:  none    PAST MEDICAL HISTORY: Past Medical History:  Diagnosis Date   Bronchitis    Bronchitis    DVT (deep venous thrombosis) (HCC)    Gout    Hammertoe of second toe of right foot    Smoker     MEDICATIONS: Current Outpatient Medications on File Prior to Visit  Medication Sig Dispense Refill   allopurinol (ZYLOPRIM) 300 MG tablet TAKE 1 TABLET (300 MG TOTAL) BY MOUTH DAILY. 30 tablet 6   amoxicillin (AMOXIL) 500 MG capsule TAKE 1 CAPSULE (500 MG TOTAL) BY MOUTH 3 (THREE) TIMES DAILY. 21 capsule 0   atorvastatin (LIPITOR) 40 MG tablet TAKE 1 TABLET (40 MG TOTAL) BY MOUTH DAILY. 30 tablet 6   Colchicine (MITIGARE) 0.6 MG CAPS TAKE 1 CAPSULE (0.6 MG) BY MOUTH AT THE ONSET OF A GOUT ATTACK, MAY REPEAT 1 CAPSULE IN 2 HOURS IF SYMPTOMS PERSIST (Patient not taking: Reported on 06/29/2020) 30 capsule 1   diclofenac Sodium (VOLTAREN) 1 % GEL Apply 2 g topically 4 (four) times daily. 100 g 3   DULoxetine (CYMBALTA) 60 MG capsule TAKE 1 CAPSULE (60 MG TOTAL) BY MOUTH DAILY. FOR BACK 30 capsule 3   iron polysaccharides (NIFEREX) 150 MG capsule Take by mouth.     lidocaine (LIDODERM) 5 % Place 1 patch onto the skin daily. Remove & Discard patch within 12 hours or as directed by MD 30 patch 1   lisinopril (ZESTRIL) 10 MG tablet Take 1.5 tablets (15 mg total) by mouth  daily. 135 tablet 1   metFORMIN (GLUCOPHAGE) 1000 MG tablet Take 1 tablet (1,000 mg total) by mouth 2 (two) times daily with a meal. 180 tablet 3   methocarbamol (ROBAXIN-750) 750 MG tablet Take 1 tablet (750 mg total) by mouth 2 (two) times daily as needed for muscle spasms. 60 tablet 1   nortriptyline (PAMELOR) 25 MG capsule TAKE 1 CAPSULE (25 MG TOTAL) BY  MOUTH AT BEDTIME. 30 capsule 5   oxyCODONE-acetaminophen (PERCOCET/ROXICET) 5-325 MG tablet Take 1 tablet by mouth every 8 (eight) hours as needed for severe pain. 6 tablet 0   pantoprazole (PROTONIX) 40 MG tablet TAKE 1 TABLET (40 MG TOTAL) BY MOUTH 2 (TWO) TIMES DAILY. (Patient not taking: Reported on 06/29/2020) 60 tablet 6   sildenafil (VIAGRA) 50 MG tablet TAKE 1 TABLET BY MOUTH DAILY AS NEEDED FOR ERECTILE DYSFUNCTION. AT LEAST 24 HOURS BETWEEN DOSES (Patient not taking: Reported on 06/29/2020) 4 tablet 2   [DISCONTINUED] FLUoxetine (PROZAC) 20 MG tablet Take 1 tablet (20 mg total) by mouth daily. 30 tablet 6   No current facility-administered medications on file prior to visit.    ALLERGIES: Allergies  Allergen Reactions   Vicodin [Hydrocodone-Acetaminophen] Hives    Patient stated he is no longer allergic to vicodin, patient stated that he was allergic about 12 years ago.     FAMILY HISTORY: Family History  Problem Relation Age of Onset   Diabetes Mother    Healthy Father    Heart disease Neg Hx    Kidney disease Neg Hx       Objective:  *** General: No acute distress.  Patient appears well-groomed.   Head:  Normocephalic/atraumatic Eyes:  Fundi examined but not visualized Neck: supple, no paraspinal tenderness, full range of motion Heart:  Regular rate and rhythm Lungs:  Clear to auscultation bilaterally Back: No paraspinal tenderness Neurological Exam: alert and oriented to person, place, and time.  Speech fluent and not dysarthric, language intact.  CN II-XII intact. Bulk and tone normal, muscle strength 5/5 throughout.  Sensation to light touch intact.  Deep tendon reflexes 2+ throughout.  Finger to nose testing intact.  Gait normal, Romberg negative.   Metta Clines, DO  CC: [name], MD

## 2020-07-30 ENCOUNTER — Ambulatory Visit: Payer: No Typology Code available for payment source | Admitting: Neurology

## 2020-09-04 ENCOUNTER — Telehealth: Payer: Self-pay | Admitting: Family Medicine

## 2020-09-04 NOTE — Telephone Encounter (Signed)
Pt was called and informed to contact dentist office for medication.

## 2020-09-04 NOTE — Telephone Encounter (Signed)
See encounter

## 2020-09-04 NOTE — Telephone Encounter (Signed)
Pt called asking if Dr. Margarita Rana was going to prescribe him an antibiotic for his mouth and teeth being pulled.  [place]  CB#  [phone]

## 2020-09-04 NOTE — Telephone Encounter (Signed)
Pt had a tooth pulled yesterday and he is asking for an antibiotic so it doesn't get infected / please advise

## 2020-09-04 NOTE — Telephone Encounter (Signed)
Pt called back to see if Dr. Margarita Rana has called in an antibiotic for his tooth that was pulled / please advise

## 2020-09-12 ENCOUNTER — Other Ambulatory Visit: Payer: Self-pay | Admitting: Family Medicine

## 2020-09-12 ENCOUNTER — Other Ambulatory Visit: Payer: Self-pay

## 2020-09-12 DIAGNOSIS — M1A0720 Idiopathic chronic gout, left ankle and foot, without tophus (tophi): Secondary | ICD-10-CM

## 2020-09-12 MED FILL — Allopurinol Tab 300 MG: ORAL | 30 days supply | Qty: 30 | Fill #1 | Status: AC

## 2020-09-12 NOTE — Telephone Encounter (Signed)
Requested medication (s) are due for refill today: no  Requested medication (s) are on the active medication list: yes  Last refill:  10/2019  Future visit scheduled: yes  Notes to clinic:  Patient has appt tomorrow    Requested Prescriptions  Pending Prescriptions Disp Refills   Colchicine (MITIGARE) 0.6 MG CAPS 30 capsule 1    Sig: TAKE 1 CAPSULE (0.6 MG) BY MOUTH AT THE ONSET OF A GOUT ATTACK, MAY REPEAT 1 CAPSULE IN 2 HOURS IF SYMPTOMS PERSIST      Endocrinology:  Gout Agents Failed - 09/12/2020  9:07 AM      Failed - Uric Acid in normal range and within 360 days    Uric Acid  Date Value Ref Range Status  07/27/2018 6.0 3.7 - 8.6 mg/dL Final    Comment:               Therapeutic target for gout patients: <6.0          Passed - Cr in normal range and within 360 days    Creatinine  Date Value Ref Range Status  09/22/2018 1.21 0.61 - 1.24 mg/dL Final   Creat  Date Value Ref Range Status  09/21/2015 0.94 0.70 - 1.33 mg/dL Final    Comment:      For patients > or = 55 years of age: The upper reference limit for Creatinine is approximately 13% higher for people identified as African-American.      Creatinine, Ser  Date Value Ref Range Status  06/07/2020 0.95 0.61 - 1.24 mg/dL Final   Creatinine, Urine  Date Value Ref Range Status  09/21/2015 171 20 - 370 mg/dL Final          Passed - Valid encounter within last 12 months    Recent Outpatient Visits           2 months ago Type 2 diabetes mellitus with obesity ([place])   [facility] [place], Neoma Laming B, MD   7 months ago Chronic midline low back pain without sciatica   [place], Charlane Ferretti, MD   10 months ago Type 2 diabetes mellitus with other specified complication, without long-term current use of insulin ([place])   [place], Charlane Ferretti, MD   1 year ago Chronic right-sided low back pain without sciatica   [place], Charlane Ferretti, MD   1 year ago Type 2 diabetes mellitus without complication, without long-term current use of insulin ([place])   [place], Charlane Ferretti, MD       Future Appointments             Tomorrow [name], MD [place]

## 2020-09-13 ENCOUNTER — Ambulatory Visit: Payer: No Typology Code available for payment source | Attending: Family Medicine | Admitting: Family Medicine

## 2020-09-13 ENCOUNTER — Other Ambulatory Visit: Payer: Self-pay

## 2020-09-13 ENCOUNTER — Encounter: Payer: Self-pay | Admitting: Family Medicine

## 2020-09-13 DIAGNOSIS — M1A0720 Idiopathic chronic gout, left ankle and foot, without tophus (tophi): Secondary | ICD-10-CM | POA: Diagnosis not present

## 2020-09-13 DIAGNOSIS — E669 Obesity, unspecified: Secondary | ICD-10-CM | POA: Diagnosis not present

## 2020-09-13 DIAGNOSIS — E1169 Type 2 diabetes mellitus with other specified complication: Secondary | ICD-10-CM

## 2020-09-13 MED ORDER — COLCHICINE 0.6 MG PO CAPS
ORAL_CAPSULE | ORAL | 1 refills | Status: DC
  Filled 2020-09-13: qty 30, 10d supply, fill #0

## 2020-09-13 NOTE — Progress Notes (Signed)
Virtual Visit via Telephone Note  I connected with Terry Poole, on 09/13/2020 at 11:14 AM by telephone due to the COVID-19 pandemic and verified that I am speaking with the correct person using two identifiers.   Consent: I discussed the limitations, risks, security and privacy concerns of performing an evaluation and management service by telephone and the availability of in person appointments. I also discussed with the patient that there may be a patient responsible charge related to this service. The patient expressed understanding and agreed to proceed.   Location of Patient: Home  Location of Provider: Clinic   Persons participating in Telemedicine visit: [name] Dr. Margarita Rana     History of Present Illness: Terry Poole is a 55 y.o. year old male with a history of type 2 diabetes mellitus (A1c 8.3) Hyperlipidemia, hypertension, gout, bilateral PE and right lower extremity DVT (diagnosed in 09/2017, completed course of anticoagulation.), s/p R TKR, depression , chronic headaches   At his last office visit his A1c was 8.3 and metformin was increased to 1000 mg twice daily. Blood sugars have been around 73-124 and he has had no hypoglycemic episodes.  He recently had a Gout flare after ingesting beef.  States he has run out of his colchicine. He has no additional concerns today. Currently undergoing some dental work. Past Medical History:  Diagnosis Date   Bronchitis    Bronchitis    DVT (deep venous thrombosis) (HCC)    Gout    Hammertoe of second toe of right foot    Smoker    Allergies  Allergen Reactions   Vicodin [Hydrocodone-Acetaminophen] Hives    Patient stated he is no longer allergic to vicodin, patient stated that he was allergic about 12 years ago.     Current Outpatient Medications on File Prior to Visit  Medication Sig Dispense Refill   allopurinol (ZYLOPRIM) 300 MG tablet TAKE 1 TABLET (300 MG TOTAL) BY MOUTH DAILY. 30 tablet 6   amoxicillin  (AMOXIL) 500 MG capsule TAKE 1 CAPSULE (500 MG TOTAL) BY MOUTH 3 (THREE) TIMES DAILY. 21 capsule 0   atorvastatin (LIPITOR) 40 MG tablet TAKE 1 TABLET (40 MG TOTAL) BY MOUTH DAILY. 30 tablet 6   Colchicine (MITIGARE) 0.6 MG CAPS TAKE 1 CAPSULE (0.6 MG) BY MOUTH AT THE ONSET OF A GOUT ATTACK, MAY REPEAT 1 CAPSULE IN 2 HOURS IF SYMPTOMS PERSIST (Patient not taking: Reported on 06/29/2020) 30 capsule 1   diclofenac Sodium (VOLTAREN) 1 % GEL Apply 2 g topically 4 (four) times daily. 100 g 3   DULoxetine (CYMBALTA) 60 MG capsule TAKE 1 CAPSULE (60 MG TOTAL) BY MOUTH DAILY. FOR BACK 30 capsule 3   iron polysaccharides (NIFEREX) 150 MG capsule Take by mouth.     lidocaine (LIDODERM) 5 % Place 1 patch onto the skin daily. Remove & Discard patch within 12 hours or as directed by MD 30 patch 1   lisinopril (ZESTRIL) 10 MG tablet Take 1.5 tablets (15 mg total) by mouth daily. 135 tablet 1   metFORMIN (GLUCOPHAGE) 1000 MG tablet Take 1 tablet (1,000 mg total) by mouth 2 (two) times daily with a meal. 180 tablet 3   methocarbamol (ROBAXIN-750) 750 MG tablet Take 1 tablet (750 mg total) by mouth 2 (two) times daily as needed for muscle spasms. 60 tablet 1   nortriptyline (PAMELOR) 25 MG capsule TAKE 1 CAPSULE (25 MG TOTAL) BY MOUTH AT BEDTIME. 30 capsule 5   oxyCODONE-acetaminophen (PERCOCET/ROXICET) 5-325 MG tablet Take 1 tablet  by mouth every 8 (eight) hours as needed for severe pain. 6 tablet 0   pantoprazole (PROTONIX) 40 MG tablet TAKE 1 TABLET (40 MG TOTAL) BY MOUTH 2 (TWO) TIMES DAILY. (Patient not taking: Reported on 06/29/2020) 60 tablet 6   sildenafil (VIAGRA) 50 MG tablet TAKE 1 TABLET BY MOUTH DAILY AS NEEDED FOR ERECTILE DYSFUNCTION. AT LEAST 24 HOURS BETWEEN DOSES (Patient not taking: Reported on 06/29/2020) 4 tablet 2   [DISCONTINUED] FLUoxetine (PROZAC) 20 MG tablet Take 1 tablet (20 mg total) by mouth daily. 30 tablet 6   No current facility-administered medications on file prior to visit.     ROS: See HPI  Observations/Objective: Awake, alert, ranted times Not in acute distress Normal mood  CMP Latest Ref Rng & Units 06/07/2020 10/25/2019 04/16/2019  Glucose 70 - 99 mg/dL 153(H) 115(H) 186(H)  BUN 6 - 20 mg/dL '12 11 15  '$ Creatinine 0.61 - 1.24 mg/dL 0.95 1.01 1.20  Sodium 135 - 145 mmol/L 140 139 141  Potassium 3.5 - 5.1 mmol/L 3.3(L) 4.0 4.4  Chloride 98 - 111 mmol/L 103 108(H) 109  CO2 22 - 32 mmol/L 26 20 19(L)  Calcium 8.9 - 10.3 mg/dL 8.8(L) 9.3 9.0  Total Protein 6.5 - 8.1 g/dL - - 7.1  Total Bilirubin 0.3 - 1.2 mg/dL - - 0.5  Alkaline Phos 38 - 126 U/L - - 100  AST 15 - 41 U/L - - 32  ALT 0 - 44 U/L - - 61(H)    Lipid Panel     Component Value Date/Time   CHOL 128 03/14/2019 1410   TRIG 330 (H) 03/14/2019 1410   HDL 44 03/14/2019 1410   CHOLHDL 2.9 03/14/2019 1410   CHOLHDL 2.6 09/21/2015 1053   VLDL 47 (H) 09/21/2015 1053   LDLCALC 36 03/14/2019 1410   LABVLDL 48 (H) 03/14/2019 1410    Lab Results  Component Value Date   HGBA1C 8.3 (A) 06/29/2020    Assessment and Plan: 1. Chronic idiopathic gout involving toe of left foot without tophus Recent flare which has resolved Counseled on low purine eating plan - Colchicine (MITIGARE) 0.6 MG CAPS; TAKE 2 CAPSULE (1.2 MG) BY MOUTH AT THE ONSET OF A GOUT ATTACK, MAY REPEAT 1 CAPSULE IN 2 HOURS IF SYMPTOMS PERSIST  Dispense: 30 capsule; Refill: 1  2. Type 2 diabetes mellitus with obesity (HCC) Uncontrolled A1c of 8.3 Metformin dose was increased at last visit Blood sugars reveal some improvement Counseled on Diabetic diet, my plate method, [id number] minutes of moderate intensity exercise/week Blood sugar logs with fasting goals of 80-120 mg/dl, random of less than 180 and in the event of sugars less than 60 mg/dl or greater than 400 mg/dl encouraged to notify the clinic. Advised on the need for annual eye exams, annual foot exams, Pneumonia vaccine.   Follow Up Instructions: Has appointment on  12/04/2020   I discussed the assessment and treatment plan with the patient. The patient was provided an opportunity to ask questions and all were answered. The patient agreed with the plan and demonstrated an understanding of the instructions.   The patient was advised to call back or seek an in-person evaluation if the symptoms worsen or if the condition fails to improve as anticipated.     I provided 12 minutes total of non-face-to-face time during this encounter.   [name], MD, FAAFP. [facility], [place]   09/13/2020, 11:14 AM

## 2020-09-13 NOTE — Telephone Encounter (Signed)
Patient states he did not know he had a 11:10 appointment today with PCP and would like a follow up call.  [phone] and would like PCP to refill medication mentioned below.

## 2020-09-14 ENCOUNTER — Other Ambulatory Visit: Payer: Self-pay

## 2020-09-17 ENCOUNTER — Emergency Department (HOSPITAL_COMMUNITY)
Admission: EM | Admit: 2020-09-17 | Discharge: 2020-09-18 | Disposition: A | Discharge status: Home / Self Care | Payer: BLUE CROSS/BLUE SHIELD | Attending: Emergency Medicine | Admitting: Emergency Medicine

## 2020-09-17 ENCOUNTER — Emergency Department (HOSPITAL_COMMUNITY): Payer: BLUE CROSS/BLUE SHIELD

## 2020-09-17 ENCOUNTER — Other Ambulatory Visit: Payer: Self-pay

## 2020-09-17 DIAGNOSIS — Z96651 Presence of right artificial knee joint: Secondary | ICD-10-CM | POA: Insufficient documentation

## 2020-09-17 DIAGNOSIS — Z7984 Long term (current) use of oral hypoglycemic drugs: Secondary | ICD-10-CM | POA: Diagnosis not present

## 2020-09-17 DIAGNOSIS — Z87891 Personal history of nicotine dependence: Secondary | ICD-10-CM | POA: Diagnosis not present

## 2020-09-17 DIAGNOSIS — K922 Gastrointestinal hemorrhage, unspecified: Secondary | ICD-10-CM | POA: Diagnosis not present

## 2020-09-17 DIAGNOSIS — E119 Type 2 diabetes mellitus without complications: Secondary | ICD-10-CM | POA: Insufficient documentation

## 2020-09-17 DIAGNOSIS — K921 Melena: Secondary | ICD-10-CM | POA: Diagnosis present

## 2020-09-17 LAB — CBC WITH DIFFERENTIAL/PLATELET
Abs Immature Granulocytes: 0 10*3/uL (ref 0.00–0.07)
Basophils Absolute: 0 10*3/uL (ref 0.0–0.1)
Basophils Relative: 0 %
Eosinophils Absolute: 0.2 10*3/uL (ref 0.0–0.5)
Eosinophils Relative: 2 %
HCT: 40.1 % (ref 39.0–52.0)
Hemoglobin: 12.9 g/dL — ABNORMAL LOW (ref 13.0–17.0)
Immature Granulocytes: 0 %
Lymphocytes Relative: 60 %
Lymphs Abs: 4.8 10*3/uL — ABNORMAL HIGH (ref 0.7–4.0)
MCH: 27.8 pg (ref 26.0–34.0)
MCHC: 32.2 g/dL (ref 30.0–36.0)
MCV: 86.4 fL (ref 80.0–100.0)
Monocytes Absolute: 0.4 10*3/uL (ref 0.1–1.0)
Monocytes Relative: 5 %
Neutro Abs: 2.6 10*3/uL (ref 1.7–7.7)
Neutrophils Relative %: 33 %
Platelets: 278 10*3/uL (ref 150–400)
RBC: 4.64 MIL/uL (ref 4.22–5.81)
RDW: 15.1 % (ref 11.5–15.5)
WBC: 7.9 10*3/uL (ref 4.0–10.5)
nRBC: 0 % (ref 0.0–0.2)

## 2020-09-17 LAB — COMPREHENSIVE METABOLIC PANEL
ALT: 30 U/L (ref 0–44)
AST: 23 U/L (ref 15–41)
Albumin: 4.1 g/dL (ref 3.5–5.0)
Alkaline Phosphatase: 96 U/L (ref 38–126)
Anion gap: 12 (ref 5–15)
BUN: 8 mg/dL (ref 6–20)
CO2: 25 mmol/L (ref 22–32)
Calcium: 9.5 mg/dL (ref 8.9–10.3)
Chloride: 103 mmol/L (ref 98–111)
Creatinine, Ser: 1.08 mg/dL (ref 0.61–1.24)
GFR, Estimated: >60 mL/min (ref >60)
Glucose, Bld: 125 mg/dL — ABNORMAL HIGH (ref 70–99)
Potassium: 3.8 mmol/L (ref 3.5–5.1)
Sodium: 140 mmol/L (ref 135–145)
Total Bilirubin: 0.7 mg/dL (ref 0.3–1.2)
Total Protein: 7.3 g/dL (ref 6.5–8.1)

## 2020-09-17 LAB — LIPASE, BLOOD: Lipase: 21 U/L (ref 11–51)

## 2020-09-17 LAB — TROPONIN I (HIGH SENSITIVITY): Troponin I (High Sensitivity): 7 ng/L (ref <18)

## 2020-09-17 NOTE — ED Provider Notes (Addendum)
Emergency Medicine Provider Triage Evaluation Note  Terry Poole , a 55 y.o. male  was evaluated in triage.  Pt complains of luq abd pain and left chest pain. Also c/o dark bloody stools started today. Denies heavy nsaid or etoh use. Not anticoagulated  Review of Systems  Positive: Luq pain, bloody stools, left chest pain Negative: fever  Physical Exam  There were no vitals taken for this visit. Gen:   Awake, no distress   Resp:  Normal effort  MSK:   Moves extremities without difficulty  Other:  Heart rrr, lungs ctab, abd soft  Medical Decision Making  Medically screening exam initiated at 8:19 PM.  Appropriate orders placed.  [facility] was informed that the remainder of the evaluation will be completed by another provider, this initial triage assessment does not replace that evaluation, and the importance of remaining in the ED until their evaluation is complete.     Tivis Ringer, Jorgia Manthei S, PA-C 09/17/20 2020    [place] Petron, [place], PA-C 09/17/20 [place], [place], DO 09/17/20 2304

## 2020-09-17 NOTE — ED Triage Notes (Signed)
Pt arrive POV from home with c/o left, left rib cage pain that started today with bloody stool.

## 2020-09-18 ENCOUNTER — Ambulatory Visit: Payer: No Typology Code available for payment source | Attending: Family Medicine | Admitting: Family Medicine

## 2020-09-18 ENCOUNTER — Encounter: Payer: Self-pay | Admitting: Family Medicine

## 2020-09-18 DIAGNOSIS — J40 Bronchitis, not specified as acute or chronic: Secondary | ICD-10-CM

## 2020-09-18 DIAGNOSIS — K922 Gastrointestinal hemorrhage, unspecified: Secondary | ICD-10-CM | POA: Diagnosis not present

## 2020-09-18 LAB — HEMOGLOBIN AND HEMATOCRIT, BLOOD
HCT: 40.8 % (ref 39.0–52.0)
Hemoglobin: 13.1 g/dL (ref 13.0–17.0)

## 2020-09-18 LAB — POC OCCULT BLOOD, ED: Fecal Occult Bld: POSITIVE — AB

## 2020-09-18 LAB — TROPONIN I (HIGH SENSITIVITY): Troponin I (High Sensitivity): 6 ng/L (ref <18)

## 2020-09-18 MED ORDER — MISC. DEVICES MISC: 0 refills | Status: AC

## 2020-09-18 MED ORDER — OMEPRAZOLE 40 MG PO CPDR: 40.0000 mg | DELAYED_RELEASE_CAPSULE | Freq: Every day | ORAL | 0 refills | Status: DC

## 2020-09-18 MED ORDER — PANTOPRAZOLE SODIUM 40 MG IV SOLR
40.0000 mg | Freq: Once | INTRAVENOUS | Status: AC
  Administered 2020-09-18: 40 mg via INTRAVENOUS
  Filled 2020-09-18: qty 40

## 2020-09-18 NOTE — ED Provider Notes (Signed)
[facility] EMERGENCY DEPARTMENT Provider Note   CSN: [id number] Arrival date & time: 09/17/20  2008     History Chief Complaint  Patient presents with   Rectal Bleeding    Terry Poole is a 55 y.o. male.  HPI     This a 55 year old male with a history of DVT no longer on blood thinners, smoker who presents with bloody stools.  Patient reports yesterday morning he woke up around 2 AM.  He was having some left-sided abdominal discomfort which she describes as crampy.  He had a large bloody bowel movement.  He describes it as dark blood.  He subsequently had 3-4 bowel movements throughout the day yesterday that were all bloody.  He denies any syncope or dizziness.  He reports some ongoing lower abdominal and left crampy abdominal pain.  He also reports some sharp left-sided chest discomfort.  It is worse with certain movements.  Rates his pain at 7 out of 10.  Of note, recently had a dental procedure and was taking 800 mg of ibuprofen up to 1 week ago.  Denies any alcohol use.  Past Medical History:  Diagnosis Date   Bronchitis    Bronchitis    DVT (deep venous thrombosis) (HCC)    Gout    Hammertoe of second toe of right foot    Smoker     Patient Active Problem List   Diagnosis Date Noted   Total knee replacement status, right 04/14/2019   Osteoarthritis of right knee 12/10/2017   DVT (deep venous thrombosis) ([place]) 10/13/2017   Pulmonary embolism ([place]) 10/02/2017   AKI (acute kidney injury) ([place]) 09/22/2017   Chest pain 09/22/2017   Hypertension 11/19/2016   Type 2 diabetes mellitus ([place]) 08/19/2016   Hyperlipidemia 08/19/2016   Back pain 05/13/2016   Hammertoe of second toe of right foot 03/06/2016   Onychomycosis [id number]   Pseudofolliculitis barbae [id number]   Gout 03/28/2015    Past Surgical History:  Procedure Laterality Date   HAMMER TOE SURGERY Right 03/10/2016   Procedure: 2ND RIGHT HAMMER TOE CORRECTION;  Surgeon: Landis Martins, DPM;   Location: [facility];  Service: Podiatry;  Laterality: Right;   MASS EXCISION Right 03/10/2016   Procedure: EXCISION OF CALLUS 2ND RIGHT TOE;  Surgeon: Landis Martins, DPM;  Location: [place];  Service: Podiatry;  Laterality: Right;   NO PAST SURGERIES         Family History  Problem Relation Age of Onset   Diabetes Mother    Healthy Father    Heart disease Neg Hx    Kidney disease Neg Hx     Social History   Tobacco Use   Smoking status: Former    Packs/day: 0.25    Types: Cigarettes    Quit date: 01/25/2019    Years since quitting: 1.6   Smokeless tobacco: Never   Tobacco comments:    8 cigs daily  Vaping Use   Vaping Use: Never used  Substance Use Topics   Alcohol use: No   Drug use: No    Home Medications Prior to Admission medications   Medication Sig Start Date End Date Taking? Authorizing Provider  omeprazole (PRILOSEC) 40 MG capsule Take 1 capsule (40 mg total) by mouth daily. 09/18/20  Yes Katherine Tout, Barbette Hair, MD  allopurinol (ZYLOPRIM) 300 MG tablet TAKE 1 TABLET (300 MG TOTAL) BY MOUTH DAILY. 10/25/19 10/24/20  [name], MD  amoxicillin (AMOXIL) 500 MG capsule TAKE 1 CAPSULE (500  MG TOTAL) BY MOUTH 3 (THREE) TIMES DAILY. 06/29/20 06/29/21  Ladell Pier, MD  atorvastatin (LIPITOR) 40 MG tablet TAKE 1 TABLET (40 MG TOTAL) BY MOUTH DAILY. 10/25/19 10/24/20  [name], MD  Colchicine (MITIGARE) 0.6 MG CAPS TAKE 2 CAPSULE (1.2 MG) BY MOUTH AT THE ONSET OF A GOUT ATTACK, MAY REPEAT 1 CAPSULE IN 2 HOURS IF SYMPTOMS PERSIST 09/13/20   [name], MD  diclofenac Sodium (VOLTAREN) 1 % GEL Apply 2 g topically 4 (four) times daily. 06/29/20   Ladell Pier, MD  DULoxetine (CYMBALTA) 60 MG capsule TAKE 1 CAPSULE (60 MG TOTAL) BY MOUTH DAILY. FOR BACK 06/29/20 06/29/21  Ladell Pier, MD  iron polysaccharides (NIFEREX) 150 MG capsule Take by mouth. 01/08/19   [provider]  lidocaine (LIDODERM) 5 % Place 1 patch  onto the skin daily. Remove & Discard patch within 12 hours or as directed by MD 01/19/20   [name], MD  lisinopril (ZESTRIL) 10 MG tablet Take 1.5 tablets (15 mg total) by mouth daily. 06/29/20   Ladell Pier, MD  metFORMIN (GLUCOPHAGE) 1000 MG tablet Take 1 tablet (1,000 mg total) by mouth 2 (two) times daily with a meal. 06/29/20   Ladell Pier, MD  methocarbamol (ROBAXIN-750) 750 MG tablet Take 1 tablet (750 mg total) by mouth 2 (two) times daily as needed for muscle spasms. 06/29/20   Ladell Pier, MD  nortriptyline (PAMELOR) 25 MG capsule TAKE 1 CAPSULE (25 MG TOTAL) BY MOUTH AT BEDTIME. 01/30/20 01/29/21  Pieter Partridge, DO  oxyCODONE-acetaminophen (PERCOCET/ROXICET) 5-325 MG tablet Take 1 tablet by mouth every 8 (eight) hours as needed for severe pain. 03/28/20   Rayna Sexton, PA-C  pantoprazole (PROTONIX) 40 MG tablet TAKE 1 TABLET (40 MG TOTAL) BY MOUTH 2 (TWO) TIMES DAILY. Patient not taking: Reported on 06/29/2020 10/25/19 10/24/20  [name], MD  sildenafil (VIAGRA) 50 MG tablet TAKE 1 TABLET BY MOUTH DAILY AS NEEDED FOR ERECTILE DYSFUNCTION. AT LEAST 24 HOURS BETWEEN DOSES Patient not taking: Reported on 06/29/2020 12/09/19   [name], MD  FLUoxetine (PROZAC) 20 MG tablet Take 1 tablet (20 mg total) by mouth daily. 10/25/19 01/30/20  [name], MD    Allergies    Vicodin [hydrocodone-acetaminophen]  Review of Systems   Review of Systems  Constitutional:  Negative for fever.  Respiratory:  Negative for shortness of breath.   Cardiovascular:  Positive for chest pain.  Gastrointestinal:  Positive for abdominal pain and blood in stool. Negative for constipation, diarrhea, nausea and vomiting.  Genitourinary:  Negative for dysuria.  All other systems reviewed and are negative.  Physical Exam Updated Vital Signs BP (!) 164/95   Pulse 83   Temp 98 F (36.7 C) (Oral)   Resp 16   Ht 1.753 m ('5\' 9"'$ )   Wt 107.1 kg   SpO2 98%   BMI 34.87  kg/m   Physical Exam Vitals and nursing note reviewed.  Constitutional:      Appearance: He is well-developed. He is obese. He is not ill-appearing.  HENT:     Head: Normocephalic and atraumatic.     Mouth/Throat:     Mouth: Mucous membranes are moist.  Eyes:     Pupils: Pupils are equal, round, and reactive to light.  Cardiovascular:     Rate and Rhythm: Normal rate and regular rhythm.     Heart sounds: Normal heart sounds. No murmur heard.    Comments: Tenderness palpation left chest wall,  no overlying skin changes or crepitus Pulmonary:     Effort: Pulmonary effort is normal. No respiratory distress.     Breath sounds: Normal breath sounds. No wheezing.  Abdominal:     General: Bowel sounds are normal.     Palpations: Abdomen is soft.     Tenderness: There is abdominal tenderness. There is no rebound.     Comments: Left and suprapubic tenderness palpation, no rebound or guarding  Genitourinary:    Rectum: Guaiac result positive.     Comments: Gross blood noted on rectal exam, no external hemorrhoid Musculoskeletal:     Cervical back: Neck supple.     Right lower leg: No edema.     Left lower leg: No edema.  Lymphadenopathy:     Cervical: No cervical adenopathy.  Skin:    General: Skin is warm and dry.  Neurological:     Mental Status: He is alert and oriented to person, place, and time.  Psychiatric:        Mood and Affect: Mood normal.    ED Results / Procedures / Treatments   Labs (all labs ordered are listed, but only abnormal results are displayed) Labs Reviewed  CBC WITH DIFFERENTIAL/PLATELET - Abnormal; Notable for the following components:      Result Value   Hemoglobin 12.9 (*)    Lymphs Abs 4.8 (*)    All other components within normal limits  COMPREHENSIVE METABOLIC PANEL - Abnormal; Notable for the following components:   Glucose, Bld 125 (*)    All other components within normal limits  POC OCCULT BLOOD, ED - Abnormal; Notable for the following  components:   Fecal Occult Bld POSITIVE (*)    All other components within normal limits  LIPASE, BLOOD  HEMOGLOBIN AND HEMATOCRIT, BLOOD  TROPONIN I (HIGH SENSITIVITY)  TROPONIN I (HIGH SENSITIVITY)    EKG EKG Interpretation  Date/Time:  Monday September 17 2020 20:16:30 EDT Ventricular Rate:  95 PR Interval:  150 QRS Duration: 98 QT Interval:  374 QTC Calculation: 469 R Axis:   40 Text Interpretation: Normal sinus rhythm Nonspecific T wave abnormality Prolonged QT Abnormal ECG Confirmed by Thayer Jew [phone]) on 09/18/2020 5:41:17 AM  Radiology DG Chest 2 View  Result Date: 09/17/2020 CLINICAL DATA:  Chest pain on the left EXAM: CHEST - 2 VIEW COMPARISON:  06/07/2020 FINDINGS: The heart size and mediastinal contours are within normal limits. Both lungs are clear. The visualized skeletal structures are unremarkable. IMPRESSION: No active cardiopulmonary disease. Electronically Signed   By: Inez Catalina M.D.   On: 09/17/2020 20:47    Procedures Procedures   Medications Ordered in ED Medications  pantoprazole (PROTONIX) injection 40 mg (40 mg Intravenous Given 09/18/20 0720)    ED Course  I have reviewed the triage vital signs and the nursing notes.  Pertinent labs & imaging results that were available during my care of the patient were reviewed by me and considered in my medical decision making (see chart for details).    MDM Rules/Calculators/A&P                           Patient presents with bloody stools.  He also reports some abdominal and chest discomfort.  He is overall nontoxic and vital signs are reassuring.  He has no dizziness.  He is not on any anticoagulation.  He has slight gross blood on rectal exam.  He has not had any recurrent bowel movements while in  the emergency department.  His initial hemoglobin was 12.8.  He was seen by me proximately 10 hours later.  We will repeat hemoglobin for stability.  Regarding chest pain, it is sharp and reproducible.  EKG  without acute ischemic changes.  Troponin x2 negative.  Doubt PE.  Would consider musculoskeletal etiology versus upper GI.  Did recently take high-dose NSAIDs.  Patient given IV Protonix.  Repeat hemoglobin 13.1.  This is very reassuring.  Patient continues to be hemodynamically stable and without active bleeding in the emergency department.  We discussed discontinuing all NSAIDs and starting PPI.  Feel that he has appropriate for close GI follow-up as an outpatient.  Patient stated understanding.  After history, exam, and medical workup I feel the patient has been appropriately medically screened and is safe for discharge home. Pertinent diagnoses were discussed with the patient. Patient was given return precautions.    Final Clinical Impression(s) / ED Diagnoses Final diagnoses:  Gastrointestinal hemorrhage, unspecified gastrointestinal hemorrhage type    Rx / DC Orders ED Discharge Orders          Ordered    omeprazole (PRILOSEC) 40 MG capsule  Daily        09/18/20 0714             Merryl Hacker, MD 09/18/20 [phone]

## 2020-09-18 NOTE — Discharge Instructions (Signed)
Seen today for bloody stools and abdominal discomfort.  Your hemoglobin has remained stable.  Do not take any ibuprofen or naproxen as this can cause gastritis and GI bleeding.  Start omeprazole daily.  Call gastroenterology for close follow-up and colonoscopy.  If you have increased bleeding, worsening bleeding, worsening abdominal pain, any new or worsening symptoms, you should be reevaluated.

## 2020-09-18 NOTE — Progress Notes (Signed)
Virtual Visit via Telephone Note  I connected with Terry Poole, on 09/18/2020 at 9:50 AM by telephone due to the COVID-19 pandemic and verified that I am speaking with the correct person using two identifiers.   Consent: I discussed the limitations, risks, security and privacy concerns of performing an evaluation and management service by telephone and the availability of in person appointments. I also discussed with the patient that there may be a patient responsible charge related to this service. The patient expressed understanding and agreed to proceed.   Location of Patient: Home  Location of Provider: Clinic   Persons participating in Telemedicine visit: [name] Dr. Margarita Rana     History of Present Illness: Terry Poole is a 55 y.o. year old male with  a history of type 2 diabetes mellitus (A1c 8.3) Hyperlipidemia, hypertension, Gout, bilateral PE and right lower extremity DVT (diagnosed in 09/2017, completed course of anticoagulation.), s/p R TKR, depression , chronic headaches.  He is seen today for an ED follow-up after he presented yesterday to [facility] ED with lower GI bleed.  Notes describe abdominal pain and a large bloody bowel movement which was dark in color.  Notes also revealed he had been on huge doses of ibuprofen for dental procedure.  Hemoglobin was 12.9 and he was prescribed omeprazole which he is yet to pick up. He denies additional dark-colored stools and reports doing well with no abdominal pain.  He is requesting a prescription for nebulizer machine as he does have the solution but will need a machine for his bronchitis.  He endorses some wheezing.   Past Medical History:  Diagnosis Date   Bronchitis    Bronchitis    DVT (deep venous thrombosis) (HCC)    Gout    Hammertoe of second toe of right foot    Smoker    Allergies  Allergen Reactions   Vicodin [Hydrocodone-Acetaminophen] Hives    Patient stated he is no longer allergic to vicodin,  patient stated that he was allergic about 12 years ago.     Current Outpatient Medications on File Prior to Visit  Medication Sig Dispense Refill   allopurinol (ZYLOPRIM) 300 MG tablet TAKE 1 TABLET (300 MG TOTAL) BY MOUTH DAILY. 30 tablet 6   amoxicillin (AMOXIL) 500 MG capsule TAKE 1 CAPSULE (500 MG TOTAL) BY MOUTH 3 (THREE) TIMES DAILY. 21 capsule 0   atorvastatin (LIPITOR) 40 MG tablet TAKE 1 TABLET (40 MG TOTAL) BY MOUTH DAILY. 30 tablet 6   Colchicine (MITIGARE) 0.6 MG CAPS TAKE 2 CAPSULE (1.2 MG) BY MOUTH AT THE ONSET OF A GOUT ATTACK, MAY REPEAT 1 CAPSULE IN 2 HOURS IF SYMPTOMS PERSIST 30 capsule 1   diclofenac Sodium (VOLTAREN) 1 % GEL Apply 2 g topically 4 (four) times daily. 100 g 3   DULoxetine (CYMBALTA) 60 MG capsule TAKE 1 CAPSULE (60 MG TOTAL) BY MOUTH DAILY. FOR BACK 30 capsule 3   iron polysaccharides (NIFEREX) 150 MG capsule Take by mouth.     lidocaine (LIDODERM) 5 % Place 1 patch onto the skin daily. Remove & Discard patch within 12 hours or as directed by MD 30 patch 1   lisinopril (ZESTRIL) 10 MG tablet Take 1.5 tablets (15 mg total) by mouth daily. 135 tablet 1   metFORMIN (GLUCOPHAGE) 1000 MG tablet Take 1 tablet (1,000 mg total) by mouth 2 (two) times daily with a meal. 180 tablet 3   methocarbamol (ROBAXIN-750) 750 MG tablet Take 1 tablet (750 mg total) by  mouth 2 (two) times daily as needed for muscle spasms. 60 tablet 1   nortriptyline (PAMELOR) 25 MG capsule TAKE 1 CAPSULE (25 MG TOTAL) BY MOUTH AT BEDTIME. 30 capsule 5   omeprazole (PRILOSEC) 40 MG capsule Take 1 capsule (40 mg total) by mouth daily. 30 capsule 0   oxyCODONE-acetaminophen (PERCOCET/ROXICET) 5-325 MG tablet Take 1 tablet by mouth every 8 (eight) hours as needed for severe pain. 6 tablet 0   sildenafil (VIAGRA) 50 MG tablet TAKE 1 TABLET BY MOUTH DAILY AS NEEDED FOR ERECTILE DYSFUNCTION. AT LEAST 24 HOURS BETWEEN DOSES (Patient not taking: Reported on 06/29/2020) 4 tablet 2   [DISCONTINUED] FLUoxetine  (PROZAC) 20 MG tablet Take 1 tablet (20 mg total) by mouth daily. 30 tablet 6   No current facility-administered medications on file prior to visit.    ROS: See HPI  Observations/Objective: Awake, alert, oriented x3 Not in acute distress Normal mood  CMP Latest Ref Rng & Units 09/17/2020 06/07/2020 10/25/2019  Glucose 70 - 99 mg/dL 125(H) 153(H) 115(H)  BUN 6 - 20 mg/dL '8 12 11  '$ Creatinine 0.61 - 1.24 mg/dL 1.08 0.95 1.01  Sodium 135 - 145 mmol/L 140 140 139  Potassium 3.5 - 5.1 mmol/L 3.8 3.3(L) 4.0  Chloride 98 - 111 mmol/L 103 103 108(H)  CO2 22 - 32 mmol/L '25 26 20  '$ Calcium 8.9 - 10.3 mg/dL 9.5 8.8(L) 9.3  Total Protein 6.5 - 8.1 g/dL 7.3 - -  Total Bilirubin 0.3 - 1.2 mg/dL 0.7 - -  Alkaline Phos 38 - 126 U/L 96 - -  AST 15 - 41 U/L 23 - -  ALT 0 - 44 U/L 30 - -    Lipid Panel     Component Value Date/Time   CHOL 128 03/14/2019 1410   TRIG 330 (H) 03/14/2019 1410   HDL 44 03/14/2019 1410   CHOLHDL 2.9 03/14/2019 1410   CHOLHDL 2.6 09/21/2015 1053   VLDL 47 (H) 09/21/2015 1053   LDLCALC 36 03/14/2019 1410   LABVLDL 48 (H) 03/14/2019 1410    Lab Results  Component Value Date   HGBA1C 8.3 (A) 06/29/2020    Assessment and Plan: 1. Lower GI bleed Resolved Likely due to prolonged NSAID use Advised to pick up omeprazole from the pharmacy  2. Bronchitis He does have intermittent wheezing Will write prescription for nebulizer. - Misc. Devices MISC; Nebulizer machine. Dx: Bronchitis  Dispense: 1 each; Refill: 0   Follow Up Instructions: Keep previously scheduled appointment   I discussed the assessment and treatment plan with the patient. The patient was provided an opportunity to ask questions and all were answered. The patient agreed with the plan and demonstrated an understanding of the instructions.   The patient was advised to call back or seek an in-person evaluation if the symptoms worsen or if the condition fails to improve as anticipated.     I  provided 11 minutes total of non-face-to-face time during this encounter.   [name], MD, FAAFP. [facility] and [place], [place]   09/18/2020, 9:50 AM

## 2020-09-19 ENCOUNTER — Other Ambulatory Visit: Payer: Self-pay

## 2020-09-19 MED ORDER — OMEPRAZOLE 40 MG PO CPDR
40.0000 mg | DELAYED_RELEASE_CAPSULE | Freq: Every day | ORAL | 0 refills | Status: DC
  Filled 2020-09-19: qty 30, 30d supply, fill #0

## 2020-10-03 ENCOUNTER — Other Ambulatory Visit: Payer: Self-pay

## 2020-10-04 ENCOUNTER — Other Ambulatory Visit: Payer: Self-pay

## 2020-10-05 ENCOUNTER — Other Ambulatory Visit: Payer: Self-pay | Admitting: Family Medicine

## 2020-10-05 DIAGNOSIS — M1A0720 Idiopathic chronic gout, left ankle and foot, without tophus (tophi): Secondary | ICD-10-CM

## 2020-10-09 ENCOUNTER — Other Ambulatory Visit: Payer: Self-pay

## 2020-10-11 ENCOUNTER — Other Ambulatory Visit: Payer: Self-pay | Admitting: Family Medicine

## 2020-10-11 ENCOUNTER — Other Ambulatory Visit: Payer: Self-pay

## 2020-10-11 DIAGNOSIS — E1169 Type 2 diabetes mellitus with other specified complication: Secondary | ICD-10-CM

## 2020-10-11 DIAGNOSIS — E669 Obesity, unspecified: Secondary | ICD-10-CM

## 2020-10-11 DIAGNOSIS — M1A0720 Idiopathic chronic gout, left ankle and foot, without tophus (tophi): Secondary | ICD-10-CM

## 2020-10-11 DIAGNOSIS — I152 Hypertension secondary to endocrine disorders: Secondary | ICD-10-CM

## 2020-10-11 DIAGNOSIS — E1159 Type 2 diabetes mellitus with other circulatory complications: Secondary | ICD-10-CM

## 2020-10-11 NOTE — Telephone Encounter (Signed)
Medication Refill - Medication: Allopurinol, lisinopril, pantoprazole, metformin, colchicine   Has the patient contacted their pharmacy? Yes.   Magda Paganini, from pharmacy calling in prescription for pt. Please advise.  (Agent: If no, request that the patient contact the pharmacy for the refill.) (Agent: If yes, when and what did the pharmacy advise?)  Preferred Pharmacy (with phone number or street name):  [place], [place]  [place] [address]  Phone: [phone] Fax: [phone]  Hours: Not open 24 hours    Agent: Please be advised that RX refills may take up to 3 business days. We ask that you follow-up with your pharmacy.

## 2020-10-12 ENCOUNTER — Other Ambulatory Visit: Payer: Self-pay | Admitting: Family Medicine

## 2020-10-12 MED ORDER — ALLOPURINOL 300 MG PO TABS: ORAL_TABLET | Freq: Every day | ORAL | 2 refills | Status: DC

## 2020-10-12 MED ORDER — COLCHICINE 0.6 MG PO CAPS
ORAL_CAPSULE | ORAL | 1 refills | Status: DC
Start: 2020-10-12 — End: 2020-12-01

## 2020-10-12 MED ORDER — LISINOPRIL 10 MG PO TABS: 15.0000 mg | ORAL_TABLET | Freq: Every day | ORAL | 2 refills | Status: DC

## 2020-10-12 MED ORDER — METFORMIN HCL 1000 MG PO TABS: 1000.0000 mg | ORAL_TABLET | Freq: Two times a day (BID) | ORAL | 1 refills | Status: DC

## 2020-10-12 MED ORDER — OMEPRAZOLE 40 MG PO CPDR: 40.0000 mg | DELAYED_RELEASE_CAPSULE | Freq: Every day | ORAL | 0 refills | Status: DC

## 2020-10-14 NOTE — Telephone Encounter (Signed)
last RF 10/12/20 #90

## 2020-10-15 ENCOUNTER — Other Ambulatory Visit: Payer: Self-pay | Admitting: Family Medicine

## 2020-10-15 DIAGNOSIS — M1A0720 Idiopathic chronic gout, left ankle and foot, without tophus (tophi): Secondary | ICD-10-CM

## 2020-10-15 NOTE — Telephone Encounter (Signed)
Pharmacy called and stated they received most refills but are missing refills for Colchicine (MITIGARE) 0.6 MG CAPS and  pantoprazole (PROTONIX) 40 MG tablet/ please advise and send to [facility]

## 2020-10-16 NOTE — Telephone Encounter (Signed)
Call to patient- (Rx filled by office 4 days ago) - left message to call office to review prescription needs

## 2020-10-16 NOTE — Telephone Encounter (Signed)
Called patient to review requested protonix 40 mg medication request. Protonix not on med list and course completed 09/18/20. Prilosec 40 mg is now on med list. Called patient , no answer, LVMTCB and clarify if prilosec is requested medication to refill.

## 2020-10-16 NOTE — Telephone Encounter (Signed)
Patient called, left VM to return the call to the office for medication refill clarification.

## 2020-10-18 ENCOUNTER — Other Ambulatory Visit: Payer: Self-pay | Admitting: Neurology

## 2020-10-23 ENCOUNTER — Other Ambulatory Visit: Payer: Self-pay | Admitting: Neurology

## 2020-10-23 ENCOUNTER — Other Ambulatory Visit: Payer: Self-pay | Admitting: Family Medicine

## 2020-10-26 ENCOUNTER — Other Ambulatory Visit: Payer: Self-pay | Admitting: Family Medicine

## 2020-10-26 DIAGNOSIS — E1169 Type 2 diabetes mellitus with other specified complication: Secondary | ICD-10-CM

## 2020-10-26 NOTE — Telephone Encounter (Signed)
Please fill if appropriate.  

## 2020-10-26 NOTE — Telephone Encounter (Signed)
Requested medication (s) are due for refill today: expired medication  Requested medication (s) are on the active medication list: yes  Last refill:  10/25/19-10/24/20 #30 6 refills  Future visit scheduled: yes in 1 month  Notes to clinic:  expired medication . Do you want to renew Rx? Last labs 03/14/19     Requested Prescriptions  Pending Prescriptions Disp Refills   atorvastatin (LIPITOR) 40 MG tablet [Pharmacy Med Name: ATORVASTATIN 40MG  TAB 40 Tablet] 30 tablet 10    Sig: TAKE 1 TABLET BY MOUTH DAILY     Cardiovascular:  Antilipid - Statins Failed - 10/26/2020 10:14 AM      Failed - Total Cholesterol in normal range and within 360 days    Cholesterol, Total  Date Value Ref Range Status  03/14/2019 128 100 - 199 mg/dL Final          Failed - LDL in normal range and within 360 days    LDL Chol Calc (NIH)  Date Value Ref Range Status  03/14/2019 36 0 - 99 mg/dL Final          Failed - HDL in normal range and within 360 days    HDL  Date Value Ref Range Status  03/14/2019 44 >39 mg/dL Final          Failed - Triglycerides in normal range and within 360 days    Triglycerides  Date Value Ref Range Status  03/14/2019 330 (H) 0 - 149 mg/dL Final          Passed - Patient is not pregnant      Passed - Valid encounter within last 12 months    Recent Outpatient Visits           1 month ago Lower GI bleed   [place], Crowley, MD   1 month ago Type 2 diabetes mellitus with obesity ([place])   [place], Shields, MD   3 months ago Type 2 diabetes mellitus with obesity [facility])   [place] Karle Plumber B, MD   9 months ago Chronic midline low back pain without sciatica   [place], Charlane Ferretti, MD   1 year ago Type 2 diabetes mellitus with other specified complication, without long-term current use of insulin ([place])   [place], Enobong, MD       Future Appointments             In 1 month [name], MD [place]

## 2020-11-05 ENCOUNTER — Telehealth: Payer: Self-pay | Admitting: Family Medicine

## 2020-11-05 DIAGNOSIS — I152 Hypertension secondary to endocrine disorders: Secondary | ICD-10-CM

## 2020-11-05 DIAGNOSIS — E1159 Type 2 diabetes mellitus with other circulatory complications: Secondary | ICD-10-CM

## 2020-11-05 MED ORDER — LISINOPRIL 20 MG PO TABS: 20.0000 mg | ORAL_TABLET | Freq: Every day | ORAL | 1 refills | Status: DC

## 2020-11-05 NOTE — Telephone Encounter (Signed)
The prescription for 90 tablets of allopurinol was sent to his pharmacy on 10/12/2020.  I have sent a new prescription for lisinopril 20 mg to his pharmacy.

## 2020-11-05 NOTE — Telephone Encounter (Signed)
Medication Refill - Medication: Lisinopril and Allopurinol   Pt told pharmacy he take 20 mg's a day.  Has the patient contacted their pharmacy? Yes.   (Agent: If no, request that the patient contact the pharmacy for the refill.) (Agent: If yes, when and what did the pharmacy advise?)  Preferred Pharmacy (with phone number or street name): [phone] Has the patient been seen for an appointment in the last year OR does the patient have an upcoming appointment? yes  Agent: Please be advised that RX refills may take up to 3 business days. We ask that you follow-up with your pharmacy.

## 2020-11-07 ENCOUNTER — Other Ambulatory Visit: Payer: Self-pay | Admitting: Internal Medicine

## 2020-11-07 ENCOUNTER — Other Ambulatory Visit: Payer: Self-pay

## 2020-11-07 DIAGNOSIS — M1A0720 Idiopathic chronic gout, left ankle and foot, without tophus (tophi): Secondary | ICD-10-CM

## 2020-11-08 NOTE — Telephone Encounter (Signed)
Requested medication (s) are due for refill today - no  Requested medication (s) are on the active medication list -yes  Future visit scheduled -yes  Last refill: 10/11/20  Notes to clinic: Pharmacy is requesting clarification of sig and dosage on medications- sent for reveiw  Requested Prescriptions  Pending Prescriptions Disp Refills   lisinopril (ZESTRIL) 10 MG tablet [Pharmacy Med Name: lisinopril 10 mg tablet] 135 tablet 0    Sig: **CLARIFY** TAKE 1 AND 1/2 TABLETS BY MOUTH DAILY AT 9 AM     Cardiovascular:  ACE Inhibitors Failed - 11/07/2020  2:54 PM      Failed - Last BP in normal range    BP Readings from Last 1 Encounters:  09/18/20 (!) 143/96          Passed - Cr in normal range and within 180 days    Creatinine  Date Value Ref Range Status  09/22/2018 1.21 0.61 - 1.24 mg/dL Final   Creat  Date Value Ref Range Status  09/21/2015 0.94 0.70 - 1.33 mg/dL Final    Comment:      For patients > or = 55 years of age: The upper reference limit for Creatinine is approximately 13% higher for people identified as African-American.      Creatinine, Ser  Date Value Ref Range Status  09/17/2020 1.08 0.61 - 1.24 mg/dL Final   Creatinine, Urine  Date Value Ref Range Status  09/21/2015 171 20 - 370 mg/dL Final          Passed - K in normal range and within 180 days    Potassium  Date Value Ref Range Status  09/17/2020 3.8 3.5 - 5.1 mmol/L Final          Passed - Patient is not pregnant      Passed - Valid encounter within last 6 months    Recent Outpatient Visits           1 month ago Lower GI bleed   [place], [place], MD   1 month ago Type 2 diabetes mellitus with obesity ([place])   [place], [place], MD   4 months ago Type 2 diabetes mellitus with obesity ([place])   [place] Karle Plumber B, MD   9 months ago Chronic midline low back pain without  sciatica   [place], Charlane Ferretti, MD   1 year ago Type 2 diabetes mellitus with other specified complication, without long-term current use of insulin ([place])   [place], Charlane Ferretti, MD       Future Appointments             In 3 weeks [name], MD [place]             allopurinol (ZYLOPRIM) 300 MG tablet [Pharmacy Med Name: allopurinol 300 mg tablet] 30 tablet 1    Sig: **CLARIFY**T1T BY MOUTH DAILY AT 9 AM     Endocrinology:  Gout Agents Failed - 11/07/2020  2:54 PM      Failed - Uric Acid in normal range and within 360 days    Uric Acid  Date Value Ref Range Status  07/27/2018 6.0 3.7 - 8.6 mg/dL Final    Comment:               Therapeutic target for gout patients: <6.0  Passed - Cr in normal range and within 360 days    Creatinine  Date Value Ref Range Status  09/22/2018 1.21 0.61 - 1.24 mg/dL Final   Creat  Date Value Ref Range Status  09/21/2015 0.94 0.70 - 1.33 mg/dL Final    Comment:      For patients > or = 55 years of age: The upper reference limit for Creatinine is approximately 13% higher for people identified as African-American.      Creatinine, Ser  Date Value Ref Range Status  09/17/2020 1.08 0.61 - 1.24 mg/dL Final   Creatinine, Urine  Date Value Ref Range Status  09/21/2015 171 20 - 370 mg/dL Final          Passed - Valid encounter within last 12 months    Recent Outpatient Visits           1 month ago Lower GI bleed   [place], [place], MD   1 month ago Type 2 diabetes mellitus with obesity ([place])   [place], [place], MD   4 months ago Type 2 diabetes mellitus with obesity [facility])   [place] Karle Plumber B, MD   9 months ago Chronic midline low back pain without sciatica   [place], Charlane Ferretti, MD   1 year ago Type 2 diabetes mellitus with other specified complication, without long-term current use of insulin ([place])   [place], Charlane Ferretti, MD       Future Appointments             In 3 weeks [name], MD [place]               Requested Prescriptions  Pending Prescriptions Disp Refills   lisinopril (ZESTRIL) 10 MG tablet [Pharmacy Med Name: lisinopril 10 mg tablet] 135 tablet 0    Sig: **CLARIFY** TAKE 1 AND 1/2 TABLETS BY MOUTH DAILY AT 9 AM     Cardiovascular:  ACE Inhibitors Failed - 11/07/2020  2:54 PM      Failed - Last BP in normal range    BP Readings from Last 1 Encounters:  09/18/20 (!) 143/96          Passed - Cr in normal range and within 180 days    Creatinine  Date Value Ref Range Status  09/22/2018 1.21 0.61 - 1.24 mg/dL Final   Creat  Date Value Ref Range Status  09/21/2015 0.94 0.70 - 1.33 mg/dL Final    Comment:      For patients > or = 55 years of age: The upper reference limit for Creatinine is approximately 13% higher for people identified as African-American.      Creatinine, Ser  Date Value Ref Range Status  09/17/2020 1.08 0.61 - 1.24 mg/dL Final   Creatinine, Urine  Date Value Ref Range Status  09/21/2015 171 20 - 370 mg/dL Final          Passed - K in normal range and within 180 days    Potassium  Date Value Ref Range Status  09/17/2020 3.8 3.5 - 5.1 mmol/L Final          Passed - Patient is not pregnant      Passed - Valid encounter within last 6 months    Recent Outpatient Visits  1 month ago Lower GI bleed   [place], [place], MD   1 month ago Type 2 diabetes mellitus with obesity ([place])   [place], Charlane Ferretti, MD   4 months ago Type 2 diabetes mellitus with obesity [facility])   [place]  Ladell Pier, MD   9 months ago Chronic midline low back pain without sciatica   [place], Charlane Ferretti, MD   1 year ago Type 2 diabetes mellitus with other specified complication, without long-term current use of insulin ([place])   [place], Charlane Ferretti, MD       Future Appointments             In 3 weeks [name], MD [place]             allopurinol (ZYLOPRIM) 300 MG tablet [Pharmacy Med Name: allopurinol 300 mg tablet] 30 tablet 1    Sig: **CLARIFY**T1T BY MOUTH DAILY AT 9 AM     Endocrinology:  Gout Agents Failed - 11/07/2020  2:54 PM      Failed - Uric Acid in normal range and within 360 days    Uric Acid  Date Value Ref Range Status  07/27/2018 6.0 3.7 - 8.6 mg/dL Final    Comment:               Therapeutic target for gout patients: <6.0          Passed - Cr in normal range and within 360 days    Creatinine  Date Value Ref Range Status  09/22/2018 1.21 0.61 - 1.24 mg/dL Final   Creat  Date Value Ref Range Status  09/21/2015 0.94 0.70 - 1.33 mg/dL Final    Comment:      For patients > or = 55 years of age: The upper reference limit for Creatinine is approximately 13% higher for people identified as African-American.      Creatinine, Ser  Date Value Ref Range Status  09/17/2020 1.08 0.61 - 1.24 mg/dL Final   Creatinine, Urine  Date Value Ref Range Status  09/21/2015 171 20 - 370 mg/dL Final          Passed - Valid encounter within last 12 months    Recent Outpatient Visits           1 month ago Lower GI bleed   [place], [place], MD   1 month ago Type 2 diabetes mellitus with obesity ([place])   [place], [place], MD   4 months ago Type 2 diabetes mellitus with obesity [facility])   [place] Karle Plumber B, MD   9 months ago  Chronic midline low back pain without sciatica   [place], Charlane Ferretti, MD   1 year ago Type 2 diabetes mellitus with other specified complication, without long-term current use of insulin ([place])   [place], [place], MD       Future Appointments             In 3 weeks [name], MD [place]

## 2020-11-15 ENCOUNTER — Other Ambulatory Visit: Payer: Self-pay | Admitting: Family Medicine

## 2020-11-15 DIAGNOSIS — M1A0720 Idiopathic chronic gout, left ankle and foot, without tophus (tophi): Secondary | ICD-10-CM

## 2020-11-16 NOTE — Telephone Encounter (Signed)
Uric Acid from 07/27/2018, failed protocol.  Requested Prescriptions  Pending Prescriptions Disp Refills  . Colchicine 0.6 MG CAPS [Pharmacy Med Name: colchicine 0.6 mg capsule] 30 capsule 2    Sig: TAKE TWO CAPSULES BY MOUTH AT ONSET OF A GOUT ATTACK, MAY REPEAT 1 CAPSULE IN 2 HOURS IF SYMPTOMS PERSIST (VIAL)     Endocrinology:  Gout Agents Failed - 11/15/2020 11:39 AM      Failed - Uric Acid in normal range and within 360 days    Uric Acid  Date Value Ref Range Status  07/27/2018 6.0 3.7 - 8.6 mg/dL Final    Comment:               Therapeutic target for gout patients: <6.0         Passed - Cr in normal range and within 360 days    Creatinine  Date Value Ref Range Status  09/22/2018 1.21 0.61 - 1.24 mg/dL Final   Creat  Date Value Ref Range Status  09/21/2015 0.94 0.70 - 1.33 mg/dL Final    Comment:      For patients > or = 55 years of age: The upper reference limit for Creatinine is approximately 13% higher for people identified as African-American.      Creatinine, Ser  Date Value Ref Range Status  09/17/2020 1.08 0.61 - 1.24 mg/dL Final   Creatinine, Urine  Date Value Ref Range Status  09/21/2015 171 20 - 370 mg/dL Final         Passed - Valid encounter within last 12 months    Recent Outpatient Visits          1 month ago Lower GI bleed   [place], [place], MD   2 months ago Type 2 diabetes mellitus with obesity ([place])   [place], [place], MD   4 months ago Type 2 diabetes mellitus with obesity [facility])   [place] Karle Plumber B, MD   10 months ago Chronic midline low back pain without sciatica   [place], Charlane Ferretti, MD   1 year ago Type 2 diabetes mellitus with other specified complication, without long-term current use of insulin ([place])   [place], [place], MD       Future Appointments            In 2 weeks [name], MD [place]

## 2020-11-30 ENCOUNTER — Other Ambulatory Visit: Payer: Self-pay | Admitting: Family Medicine

## 2020-11-30 DIAGNOSIS — M1A0720 Idiopathic chronic gout, left ankle and foot, without tophus (tophi): Secondary | ICD-10-CM

## 2020-12-04 ENCOUNTER — Other Ambulatory Visit: Payer: Self-pay

## 2020-12-04 ENCOUNTER — Encounter: Payer: Self-pay | Admitting: Family Medicine

## 2020-12-04 ENCOUNTER — Ambulatory Visit: Payer: Medicare Other | Attending: Family Medicine | Admitting: Family Medicine

## 2020-12-04 VITALS — BP 163/97 | HR 93 | Ht 69.0 in | Wt 241.1 lb

## 2020-12-04 DIAGNOSIS — M545 Low back pain, unspecified: Secondary | ICD-10-CM

## 2020-12-04 DIAGNOSIS — M19019 Primary osteoarthritis, unspecified shoulder: Secondary | ICD-10-CM | POA: Diagnosis not present

## 2020-12-04 DIAGNOSIS — M25562 Pain in left knee: Secondary | ICD-10-CM

## 2020-12-04 DIAGNOSIS — Z23 Encounter for immunization: Secondary | ICD-10-CM

## 2020-12-04 DIAGNOSIS — E1159 Type 2 diabetes mellitus with other circulatory complications: Secondary | ICD-10-CM | POA: Diagnosis not present

## 2020-12-04 DIAGNOSIS — I152 Hypertension secondary to endocrine disorders: Secondary | ICD-10-CM

## 2020-12-04 DIAGNOSIS — G8929 Other chronic pain: Secondary | ICD-10-CM | POA: Diagnosis not present

## 2020-12-04 DIAGNOSIS — H109 Unspecified conjunctivitis: Secondary | ICD-10-CM | POA: Diagnosis not present

## 2020-12-04 DIAGNOSIS — E669 Obesity, unspecified: Secondary | ICD-10-CM

## 2020-12-04 DIAGNOSIS — Z1159 Encounter for screening for other viral diseases: Secondary | ICD-10-CM

## 2020-12-04 DIAGNOSIS — E1169 Type 2 diabetes mellitus with other specified complication: Secondary | ICD-10-CM | POA: Diagnosis not present

## 2020-12-04 LAB — POCT GLYCOSYLATED HEMOGLOBIN (HGB A1C): Hemoglobin A1C: 8.5 % — AB (ref 4.0–5.6)

## 2020-12-04 LAB — GLUCOSE, POCT (MANUAL RESULT ENTRY): POC Glucose: 277 mg/dl — AB (ref 70–99)

## 2020-12-04 MED ORDER — AMLODIPINE BESYLATE 5 MG PO TABS
5.0000 mg | ORAL_TABLET | Freq: Every day | ORAL | 1 refills | Status: AC
Start: 2020-12-04 — End: ?
  Filled 2020-12-04: qty 90, 90d supply, fill #0

## 2020-12-04 MED ORDER — POLYMYXIN B-TRIMETHOPRIM 10000-0.1 UNIT/ML-% OP SOLN
1.0000 [drp] | Freq: Four times a day (QID) | OPHTHALMIC | 0 refills | Status: AC
  Filled 2020-12-04: qty 10, 40d supply, fill #0

## 2020-12-04 MED ORDER — METHOCARBAMOL 750 MG PO TABS
750.0000 mg | ORAL_TABLET | Freq: Two times a day (BID) | ORAL | 1 refills | Status: AC | PRN
  Filled 2020-12-04: qty 60, 30d supply, fill #0

## 2020-12-04 MED ORDER — GLIPIZIDE 5 MG PO TABS
5.0000 mg | ORAL_TABLET | Freq: Two times a day (BID) | ORAL | 3 refills | Status: AC
Start: 2020-12-04 — End: ?
  Filled 2020-12-04: qty 60, 30d supply, fill #0

## 2020-12-04 NOTE — Progress Notes (Signed)
Subjective:  Patient ID: Terry Poole, male    DOB: 25-Oct-1965  Age: 55 y.o. MRN: [id number]  CC: Diabetes   HPI Terry Poole is a 55 y.o. year old male with a history of type 2 diabetes mellitus (A1c 8.5) Hyperlipidemia, hypertension, Gout, bilateral PE and right lower extremity DVT (diagnosed in 09/2017, completed course of anticoagulation.), s/p R TKR, depression , chronic headaches.  Interval History: He has had right eye and R shoulder pain for 1 week. He woke up with R shoulder pain with difficulty elevating it with no preceding history of trauma. He thinks he might have turned the wrong way and heard something 'crack'. Pain is 9.5/10 and he takes Tylenol for pain with some relief  R shoulder xray 08/2019: IMPRESSION: 1. No acute abnormality. 2. Minimal glenohumeral joint space narrowing.  Complains of left knee pain for 2 months on the medial apect. States he was told he would require knee surgery .  Knee sometimes swells and pain is moderate to severe.  His R eye has a throbbing pain and he has photophobia. Denies crusting of eye. Vision is blurry out of R eye  He just ate 2 double cheese burgers and a waffle which he thinks may be responsible for his elevated blood pressure. A1c is 8.5 and he endorses compliance with metformin. Past Medical History:  Diagnosis Date   Bronchitis    Bronchitis    DVT (deep venous thrombosis) (HCC)    Gout    Hammertoe of second toe of right foot    Smoker     Past Surgical History:  Procedure Laterality Date   HAMMER TOE SURGERY Right 03/10/2016   Procedure: 2ND RIGHT HAMMER TOE CORRECTION;  Surgeon: Landis Martins, DPM;  Location: [place];  Service: Podiatry;  Laterality: Right;   MASS EXCISION Right 03/10/2016   Procedure: EXCISION OF CALLUS 2ND RIGHT TOE;  Surgeon: Landis Martins, DPM;  Location: [place];  Service: Podiatry;  Laterality: Right;   NO PAST SURGERIES      Family History  Problem  Relation Age of Onset   Diabetes Mother    Healthy Father    Heart disease Neg Hx    Kidney disease Neg Hx     Allergies  Allergen Reactions   Vicodin [Hydrocodone-Acetaminophen] Hives    Patient stated he is no longer allergic to vicodin, patient stated that he was allergic about 12 years ago.     Outpatient Medications Prior to Visit  Medication Sig Dispense Refill   allopurinol (ZYLOPRIM) 300 MG tablet TAKE 1 TABLET (300 MG TOTAL) BY MOUTH DAILY. 90 tablet 2   atorvastatin (LIPITOR) 40 MG tablet TAKE 1 TABLET BY MOUTH DAILY 30 tablet 3   Colchicine 0.6 MG CAPS TAKE TWO CAPSULES BY MOUTH AT ONSET OF A GOUT ATTACK, MAY REPEAT 1 CAPSULE IN 2 HOURS IF SYMPTOMS PERSIST (VIAL) 30 capsule 1   DULoxetine (CYMBALTA) 60 MG capsule TAKE 1 CAPSULE (60 MG TOTAL) BY MOUTH DAILY. FOR BACK 30 capsule 3   iron polysaccharides (NIFEREX) 150 MG capsule Take by mouth.     lisinopril (ZESTRIL) 20 MG tablet Take 1 tablet (20 mg total) by mouth daily. 90 tablet 1   metFORMIN (GLUCOPHAGE) 1000 MG tablet Take 1 tablet (1,000 mg total) by mouth 2 (two) times daily with a meal. 180 tablet 1   methocarbamol (ROBAXIN-750) 750 MG tablet Take 1 tablet (750 mg total) by mouth 2 (two) times daily as needed for muscle  spasms. 60 tablet 1   Misc. Devices MISC Nebulizer machine. Dx: Bronchitis 1 each 0   nortriptyline (PAMELOR) 25 MG capsule TAKE 1 CAPSULE BY MOUTH AT BEDTIME 90 capsule 0   omeprazole (PRILOSEC) 40 MG capsule Take 1 capsule (40 mg total) by mouth daily. 90 capsule 0   sildenafil (VIAGRA) 50 MG tablet TAKE 1 TABLET BY MOUTH DAILY AS NEEDED FOR ERECTILE DYSFUNCTION. AT LEAST 24 HOURS BETWEEN DOSES 4 tablet 2   amoxicillin (AMOXIL) 500 MG capsule TAKE 1 CAPSULE (500 MG TOTAL) BY MOUTH 3 (THREE) TIMES DAILY. (Patient not taking: Reported on 12/04/2020) 21 capsule 0   diclofenac Sodium (VOLTAREN) 1 % GEL Apply 2 g topically 4 (four) times daily. (Patient not taking: Reported on 12/04/2020) 100 g 3   lidocaine  (LIDODERM) 5 % Place 1 patch onto the skin daily. Remove & Discard patch within 12 hours or as directed by MD (Patient not taking: Reported on 12/04/2020) 30 patch 1   oxyCODONE-acetaminophen (PERCOCET/ROXICET) 5-325 MG tablet Take 1 tablet by mouth every 8 (eight) hours as needed for severe pain. (Patient not taking: Reported on 12/04/2020) 6 tablet 0   No facility-administered medications prior to visit.     ROS Review of Systems  Constitutional:  Negative for activity change and appetite change.  HENT:  Negative for sinus pressure and sore throat.   Eyes:  Positive for photophobia, redness and visual disturbance.  Respiratory:  Negative for cough, chest tightness and shortness of breath.   Cardiovascular:  Negative for chest pain and leg swelling.  Gastrointestinal:  Negative for abdominal distention, abdominal pain, constipation and diarrhea.  Endocrine: Negative.   Genitourinary:  Negative for dysuria.  Musculoskeletal:  Negative for joint swelling and myalgias.  Skin:  Negative for rash.  Allergic/Immunologic: Negative.   Neurological:  Negative for weakness, light-headedness and numbness.  Psychiatric/Behavioral:  Negative for dysphoric mood and suicidal ideas.    Objective:  BP (!) 163/97   Pulse 93   Ht '5\' 9"'  (1.753 m)   Wt 241 lb 2 oz (109.4 kg)   SpO2 100%   BMI 35.61 kg/m   BP/Weight 12/04/2020 09/18/2020 1/61/0960  Systolic BP 454 098 -  Diastolic BP 97 96 -  Wt. (Lbs) 241.13 - 236.11  BMI 35.61 - 34.87      Physical Exam Constitutional:      Appearance: He is well-developed.  Eyes:     Comments: Edema of right upper eyelids Slight right bulbar erythema  Cardiovascular:     Rate and Rhythm: Normal rate.     Heart sounds: Normal heart sounds. No murmur heard. Pulmonary:     Effort: Pulmonary effort is normal.     Breath sounds: Normal breath sounds. No wheezing or rales.  Chest:     Chest wall: No tenderness.  Abdominal:     General: Bowel sounds are  normal. There is no distension.     Palpations: Abdomen is soft. There is no mass.     Tenderness: There is no abdominal tenderness.  Musculoskeletal:        General: Normal range of motion.     Right lower leg: No edema.     Left lower leg: No edema.  Neurological:     Mental Status: He is alert and oriented to person, place, and time.  Psychiatric:        Mood and Affect: Mood normal.    CMP Latest Ref Rng & Units 09/17/2020 06/07/2020 10/25/2019  Glucose 70 -  99 mg/dL 125(H) 153(H) 115(H)  BUN 6 - 20 mg/dL '8 12 11  ' Creatinine 0.61 - 1.24 mg/dL 1.08 0.95 1.01  Sodium 135 - 145 mmol/L 140 140 139  Potassium 3.5 - 5.1 mmol/L 3.8 3.3(L) 4.0  Chloride 98 - 111 mmol/L 103 103 108(H)  CO2 22 - 32 mmol/L '25 26 20  ' Calcium 8.9 - 10.3 mg/dL 9.5 8.8(L) 9.3  Total Protein 6.5 - 8.1 g/dL 7.3 - -  Total Bilirubin 0.3 - 1.2 mg/dL 0.7 - -  Alkaline Phos 38 - 126 U/L 96 - -  AST 15 - 41 U/L 23 - -  ALT 0 - 44 U/L 30 - -    Lipid Panel     Component Value Date/Time   CHOL 128 03/14/2019 1410   TRIG 330 (H) 03/14/2019 1410   HDL 44 03/14/2019 1410   CHOLHDL 2.9 03/14/2019 1410   CHOLHDL 2.6 09/21/2015 1053   VLDL 47 (H) 09/21/2015 1053   LDLCALC 36 03/14/2019 1410    CBC    Component Value Date/Time   WBC 7.9 09/17/2020 2042   RBC 4.64 09/17/2020 2042   HGB 13.1 09/18/2020 0607   HGB 13.0 03/18/2019 1410   HCT 40.8 09/18/2020 0607   HCT 39.8 03/18/2019 1410   PLT 278 09/17/2020 2042   PLT 300 03/18/2019 1410   MCV 86.4 09/17/2020 2042   MCV 85 03/18/2019 1410   MCH 27.8 09/17/2020 2042   MCHC 32.2 09/17/2020 2042   RDW 15.1 09/17/2020 2042   RDW 14.0 03/18/2019 1410   LYMPHSABS 4.8 (H) 09/17/2020 2042   LYMPHSABS 3.6 (H) 03/18/2019 1410   MONOABS 0.4 09/17/2020 2042   EOSABS 0.2 09/17/2020 2042   EOSABS 0.2 03/18/2019 1410   BASOSABS 0.0 09/17/2020 2042   BASOSABS 0.0 03/18/2019 1410    Lab Results  Component Value Date   HGBA1C 8.5 (A) 12/04/2020    Assessment &  Plan:  1. Type 2 diabetes mellitus with obesity (HCC) Uncontrolled with A1c of 8.5; goal is less than 7.0 Would love to add on GLP-1 RA but he declines any injectable Added low-dose glipizide Counseled on Diabetic diet, my plate method, 132 minutes of moderate intensity exercise/week Blood sugar logs with fasting goals of 80-120 mg/dl, random of less than 180 and in the event of sugars less than 60 mg/dl or greater than 400 mg/dl encouraged to notify the clinic. Advised on the need for annual eye exams, annual foot exams, Pneumonia vaccine.  - POCT glucose (manual entry) - POCT glycosylated hemoglobin (Hb A1C) - glipiZIDE (GLUCOTROL) 5 MG tablet; Take 1 tablet (5 mg total) by mouth 2 (two) times daily before a meal.  Dispense: 60 tablet; Refill: 3 - CMP14+EGFR; Future - LP+Non-HDL Cholesterol; Future  2. Chronic midline low back pain without sciatica Stable - methocarbamol (ROBAXIN-750) 750 MG tablet; Take 1 tablet (750 mg total) by mouth 2 (two) times daily as needed for muscle spasms.  Dispense: 60 tablet; Refill: 1  3. Bacterial conjunctivitis of right eye Will place on topical antibiotic Advised to seek ophthalmology care if symptoms persist - trimethoprim-polymyxin b (POLYTRIM) ophthalmic solution; Place 1 drop into the right eye every 6 (six) hours.  Dispense: 10 mL; Refill: 0  4. Chronic pain of left knee Likely underlying osteoarthritis Declines short course of prednisone Placed on diclofenac Will refer to orthopedic after x-ray - DG Knee Complete 4 Views Left; Future  5. Osteoarthritis of acromioclavicular joint Previous x-ray revealed AC joint space narrowing last  year NSAIDs will be beneficial Consider PT if symptoms persist as he could also have a component of adhesive capsulitis  6. Hypertension associated with diabetes ([place]) Uncontrolled Amlodipine added to regimen Counseled on blood pressure goal of less than 130/80, low-sodium, DASH diet, medication compliance,  150 minutes of moderate intensity exercise per week. Discussed medication compliance, adverse effects. - amLODipine (NORVASC) 5 MG tablet; Take 1 tablet (5 mg total) by mouth daily.  Dispense: 90 tablet; Refill: 1  7. Need for hepatitis C screening test - HCV Ab w Reflex to Quant PCR  8. Need for pneumococcal vaccine - Pneumococcal conjugate vaccine 20-valent    No orders of the defined types were placed in this encounter.   Return in about 3 months (around 03/06/2021) for Medical conditions.   48 minutes of total face to face time spent including median intraservice time reviewing previous notes and test results, counseling patient on diagnosis of AC, R knee Osteoarhritis in addition to chronic medical conditions.Time also spent ordering medications, investigations and documenting in the chart.  All questions were answered to the patient's satisfaction       [name], MD, FAAFP. [facility] [place], [place]   12/04/2020, 2:00 PM

## 2020-12-04 NOTE — Progress Notes (Signed)
8.5 Left knee pain x 2 months Right eye  and shoulder x 1 weeks

## 2020-12-04 NOTE — Patient Instructions (Signed)
Shoulder Pain °Many things can cause shoulder pain, including: °An injury to the shoulder. °Overuse of the shoulder. °Arthritis. °The source of the pain can be: °Inflammation. °An injury to the shoulder joint. °An injury to a tendon, ligament, or bone. °Follow these instructions at home: °Pay attention to changes in your symptoms. Let your health care provider know about them. Follow these instructions to relieve your pain. °If you have a sling: °Wear the sling as told by your health care provider. Remove it only as told by your health care provider. °Loosen the sling if your fingers tingle, become numb, or turn cold and blue. °Keep the sling clean. °If the sling is not waterproof: °Do not let it get wet. Remove it to shower or bathe. °Move your arm as little as possible, but keep your hand moving to prevent swelling. °Managing pain, stiffness, and swelling ° °If directed, put ice on the painful area: °Put ice in a plastic bag. °Place a towel between your skin and the bag. °Leave the ice on for 20 minutes, 2-3 times per day. Stop applying ice if it does not help with the pain. °Squeeze a soft ball or a foam pad as much as possible. This helps to keep the shoulder from swelling. It also helps to strengthen the arm. °General instructions °Take over-the-counter and prescription medicines only as told by your health care provider. °Keep all follow-up visits as told by your health care provider. This is important. °Contact a health care provider if: °Your pain gets worse. °Your pain is not relieved with medicines. °New pain develops in your arm, hand, or fingers. °Get help right away if: °Your arm, hand, or fingers: °Tingle. °Become numb. °Become swollen. °Become painful. °Turn white or blue. °Summary °Shoulder pain can be caused by an injury, overuse, or arthritis. °Pay attention to changes in your symptoms. Let your health care provider know about them. °This condition may be treated with a sling, ice, and pain  medicines. °Contact your health care provider if the pain gets worse or new pain develops. Get help right away if your arm, hand, or fingers tingle or become numb, swollen, or painful. °Keep all follow-up visits as told by your health care provider. This is important. °This information is not intended to replace advice given to you by your health care provider. Make sure you discuss any questions you have with your health care provider. °Document Revised: 08/04/2017 Document Reviewed: 08/04/2017 °Elsevier Patient Education © 2022 [employer] ° °

## 2020-12-06 ENCOUNTER — Ambulatory Visit: Payer: Medicare Other | Attending: Family Medicine

## 2020-12-06 ENCOUNTER — Other Ambulatory Visit: Payer: Self-pay

## 2020-12-06 ENCOUNTER — Ambulatory Visit (HOSPITAL_COMMUNITY)
Admission: RE | Admit: 2020-12-06 | Discharge: 2020-12-06 | Disposition: A | Discharge status: Home / Self Care | Payer: Medicare Other | Source: Ambulatory Visit | Attending: Family Medicine | Admitting: Family Medicine

## 2020-12-06 ENCOUNTER — Telehealth: Payer: Self-pay

## 2020-12-06 DIAGNOSIS — G8929 Other chronic pain: Secondary | ICD-10-CM | POA: Insufficient documentation

## 2020-12-06 DIAGNOSIS — M25562 Pain in left knee: Secondary | ICD-10-CM | POA: Diagnosis not present

## 2020-12-06 DIAGNOSIS — E1169 Type 2 diabetes mellitus with other specified complication: Secondary | ICD-10-CM | POA: Diagnosis not present

## 2020-12-06 DIAGNOSIS — E669 Obesity, unspecified: Secondary | ICD-10-CM

## 2020-12-06 MED ORDER — ZOSTER VAC RECOMB ADJUVANTED 50 MCG/0.5ML IM SUSR
0.5000 mL | Freq: Once | INTRAMUSCULAR | 1 refills | Status: AC
  Filled 2020-12-06: qty 0.5, 1d supply, fill #0

## 2020-12-07 ENCOUNTER — Telehealth: Payer: Self-pay | Admitting: Family Medicine

## 2020-12-07 ENCOUNTER — Other Ambulatory Visit: Payer: Self-pay | Admitting: Family Medicine

## 2020-12-07 ENCOUNTER — Telehealth (INDEPENDENT_AMBULATORY_CARE_PROVIDER_SITE_OTHER): Payer: Self-pay

## 2020-12-07 DIAGNOSIS — M25562 Pain in left knee: Secondary | ICD-10-CM

## 2020-12-07 DIAGNOSIS — G8929 Other chronic pain: Secondary | ICD-10-CM

## 2020-12-07 LAB — CMP14+EGFR
ALT: 34 IU/L (ref 0–44)
AST: 23 IU/L (ref 0–40)
Albumin/Globulin Ratio: 1.8 (ref 1.2–2.2)
Albumin: 5 g/dL — ABNORMAL HIGH (ref 3.8–4.9)
Alkaline Phosphatase: 146 IU/L — ABNORMAL HIGH (ref 44–121)
BUN/Creatinine Ratio: 8 — ABNORMAL LOW (ref 9–20)
BUN: 9 mg/dL (ref 6–24)
Bilirubin Total: 0.3 mg/dL (ref 0.0–1.2)
CO2: 24 mmol/L (ref 20–29)
Calcium: 9.9 mg/dL (ref 8.7–10.2)
Chloride: 100 mmol/L (ref 96–106)
Creatinine, Ser: 1.1 mg/dL (ref 0.76–1.27)
Globulin, Total: 2.8 g/dL (ref 1.5–4.5)
Glucose: 174 mg/dL — ABNORMAL HIGH (ref 70–99)
Potassium: 4.6 mmol/L (ref 3.5–5.2)
Sodium: 139 mmol/L (ref 134–144)
Total Protein: 7.8 g/dL (ref 6.0–8.5)
eGFR: 79 mL/min/{1.73_m2} (ref >59)

## 2020-12-07 LAB — LP+NON-HDL CHOLESTEROL
Cholesterol, Total: 164 mg/dL (ref 100–199)
HDL: 62 mg/dL (ref >39)
LDL Chol Calc (NIH): 83 mg/dL (ref 0–99)
Total Non-HDL-Chol (LDL+VLDL): 102 mg/dL (ref 0–129)
Triglycerides: 109 mg/dL (ref 0–149)
VLDL Cholesterol Cal: 19 mg/dL (ref 5–40)

## 2020-12-07 NOTE — Telephone Encounter (Signed)
Copied from [place] [id number]. Topic: General - Other >> Dec 05, 2020  3:48 PM Yvette Rack wrote: Reason for CRM: Pt stated he went to the dentist and his blood pressure was elevated and he needs to speak with Dr. Margarita Rana to see if she will give approval for him to have dental work done. Pt stated he feels his blood pressure was up due to both the dental issues and his knee pain. Pt requests call back asap. Cb# [phone]

## 2020-12-07 NOTE — Telephone Encounter (Signed)
Copied from [place] [id number]. Topic: General - Other >> Dec 05, 2020  3:48 PM Yvette Rack wrote: Reason for CRM: Pt stated he went to the dentist and his blood pressure was elevated and he needs to speak with Dr. Margarita Rana to see if she will give approval for him to have dental work done. Pt stated he feels his blood pressure was up due to both the dental issues and his knee pain. Pt requests call back asap. Cb# [phone]

## 2020-12-10 ENCOUNTER — Encounter: Payer: Self-pay | Admitting: Physician Assistant

## 2020-12-10 ENCOUNTER — Other Ambulatory Visit: Payer: Self-pay

## 2020-12-10 ENCOUNTER — Telehealth: Payer: Self-pay

## 2020-12-10 ENCOUNTER — Ambulatory Visit: Payer: Medicare Other | Attending: Physician Assistant | Admitting: Physician Assistant

## 2020-12-10 VITALS — BP 146/90 | HR 101 | Temp 98.5°F | Resp 20 | Ht 69.0 in | Wt 239.0 lb

## 2020-12-10 DIAGNOSIS — J0110 Acute frontal sinusitis, unspecified: Secondary | ICD-10-CM

## 2020-12-10 MED ORDER — FLUTICASONE PROPIONATE 50 MCG/ACT NA SUSP
2.0000 | Freq: Every day | NASAL | 6 refills | Status: AC
  Filled 2020-12-10: qty 16, 30d supply, fill #0

## 2020-12-10 MED ORDER — AZITHROMYCIN 250 MG PO TABS
ORAL_TABLET | ORAL | 0 refills | Status: AC
  Filled 2020-12-10: qty 6, 5d supply, fill #0

## 2020-12-10 MED ORDER — BENZONATATE 100 MG PO CAPS
100.0000 mg | ORAL_CAPSULE | Freq: Two times a day (BID) | ORAL | 0 refills | Status: AC | PRN
  Filled 2020-12-10: qty 20, 10d supply, fill #0

## 2020-12-10 MED ORDER — CETIRIZINE HCL 10 MG PO TABS
10.0000 mg | ORAL_TABLET | Freq: Every day | ORAL | 11 refills | Status: AC
  Filled 2020-12-10: qty 30, 30d supply, fill #0

## 2020-12-10 NOTE — Telephone Encounter (Signed)
-----   Message from [name], MD sent at 12/07/2020  6:51 AM EDT ----- Labs reveal normal cholesterol. One of his liver enzymes is elevated likely from his Osteoarthritis. Nothing additional needs to be done.

## 2020-12-10 NOTE — Progress Notes (Signed)
Patient has taken chronic medication and eaten today. Patient reports Head pain for he past 2 days with pressure and cough. Patient shares grandson come home from school last week with similar symptoms.

## 2020-12-10 NOTE — Telephone Encounter (Signed)
CRM created. 

## 2020-12-10 NOTE — Telephone Encounter (Signed)
-----   Message from [name], MD sent at 12/07/2020  6:54 AM EDT ----- Knee xrat shows osteoarthritis with some fluid in the knee joint. I have referred him to ortho.

## 2020-12-10 NOTE — Patient Instructions (Signed)
You are going to take azithromycin for 5 days.  It is very important that you do not take your colchicine for the next 10 days.  You will use Tessalon Perles to help you with your cough, Flonase to help you with your sinus pressure and I encourage you to start taking Zyrtec on a daily basis.  Make sure that you drink lots of fluids and plenty of rest.  I hope that you feel better soon.  Kennieth Rad, PA-C Physician Assistant [facility] [email or web address]  Sinusitis, Adult Sinusitis is inflammation of your sinuses. Sinuses are hollow spaces in the bones around your face. Your sinuses are located: Around your eyes. In the middle of your forehead. Behind your nose. In your cheekbones. Mucus normally drains out of your sinuses. When your nasal tissues become inflamed or swollen, mucus can become trapped or blocked. This allows bacteria, viruses, and fungi to grow, which leads to infection. Most infections of the sinuses are caused by a virus. Sinusitis can develop quickly. It can last for up to 4 weeks (acute) or for more than 12 weeks (chronic). Sinusitis often develops after a cold. What are the causes? This condition is caused by anything that creates swelling in the sinuses or stops mucus from draining. This includes: Allergies. Asthma. Infection from bacteria or viruses. Deformities or blockages in your nose or sinuses. Abnormal growths in the nose (nasal polyps). Pollutants, such as chemicals or irritants in the air. Infection from fungi (rare). What increases the risk? You are more likely to develop this condition if you: Have a weak body defense system (immune system). Do a lot of swimming or diving. Overuse nasal sprays. Smoke. What are the signs or symptoms? The main symptoms of this condition are pain and a feeling of pressure around the affected sinuses. Other symptoms include: Stuffy nose or congestion. Thick  drainage from your nose. Swelling and warmth over the affected sinuses. Headache. Upper toothache. A cough that may get worse at night. Extra mucus that collects in the throat or the back of the nose (postnasal drip). Decreased sense of smell and taste. Fatigue. A fever. Sore throat. Bad breath. How is this diagnosed? This condition is diagnosed based on: Your symptoms. Your medical history. A physical exam. Tests to find out if your condition is acute or chronic. This may include: Checking your nose for nasal polyps. Viewing your sinuses using a device that has a light (endoscope). Testing for allergies or bacteria. Imaging tests, such as an MRI or CT scan. In rare cases, a bone biopsy may be done to rule out more serious types of fungal sinus disease. How is this treated? Treatment for sinusitis depends on the cause and whether your condition is chronic or acute. If caused by a virus, your symptoms should go away on their own within 10 days. You may be given medicines to relieve symptoms. They include: Medicines that shrink swollen nasal passages (topical intranasal decongestants). Medicines that treat allergies (antihistamines). A spray that eases inflammation of the nostrils (topical intranasal corticosteroids). Rinses that help get rid of thick mucus in your nose (nasal saline washes). If caused by bacteria, your health care provider may recommend waiting to see if your symptoms improve. Most bacterial infections will get better without antibiotic medicine. You may be given antibiotics if you have: A severe infection. A weak immune system. If caused by narrow nasal passages or nasal polyps, you may need to have surgery. Follow these instructions at home: Medicines Take,  use, or apply over-the-counter and prescription medicines only as told by your health care provider. These may include nasal sprays. If you were prescribed an antibiotic medicine, take it as told by your  health care provider. Do not stop taking the antibiotic even if you start to feel better. Hydrate and humidify  Drink enough fluid to keep your urine pale yellow. Staying hydrated will help to thin your mucus. Use a cool mist humidifier to keep the humidity level in your home above 50%. Inhale steam for 10-15 minutes, 3-4 times a day, or as told by your health care provider. You can do this in the bathroom while a hot shower is running. Limit your exposure to cool or dry air. Rest Rest as much as possible. Sleep with your head raised (elevated). Make sure you get enough sleep each night. General instructions  Apply a warm, moist washcloth to your face 3-4 times a day or as told by your health care provider. This will help with discomfort. Wash your hands often with soap and water to reduce your exposure to germs. If soap and water are not available, use hand sanitizer. Do not smoke. Avoid being around people who are smoking (secondhand smoke). Keep all follow-up visits as told by your health care provider. This is important. Contact a health care provider if: You have a fever. Your symptoms get worse. Your symptoms do not improve within 10 days. Get help right away if: You have a severe headache. You have persistent vomiting. You have severe pain or swelling around your face or eyes. You have vision problems. You develop confusion. Your neck is stiff. You have trouble breathing. Summary Sinusitis is soreness and inflammation of your sinuses. Sinuses are hollow spaces in the bones around your face. This condition is caused by nasal tissues that become inflamed or swollen. The swelling traps or blocks the flow of mucus. This allows bacteria, viruses, and fungi to grow, which leads to infection. If you were prescribed an antibiotic medicine, take it as told by your health care provider. Do not stop taking the antibiotic even if you start to feel better. Keep all follow-up visits as told  by your health care provider. This is important. This information is not intended to replace advice given to you by your health care provider. Make sure you discuss any questions you have with your health care provider. Document Revised: 06/22/2017 Document Reviewed: 06/22/2017 Elsevier Patient Education  2022 [employer].

## 2020-12-10 NOTE — Progress Notes (Signed)
Established Patient Office Visit  Subjective:  Patient ID: Terry Poole, male    DOB: 21-Feb-1965  Age: 55 y.o. MRN: [id number]  CC:  Chief Complaint  Patient presents with   URI     HPI William Schake states that he has been feeling poorly for the past week, worse in the past 2 days.  States that he has been having body aches, sinus pressure, productive cough with white sputum.  Reports he has been having difficulty sleeping due to the cough.  Reports that he is eating and drinking okay.  Reports grandson came home from school last week with similar symptoms.  Has not tried anything for relief   Past Medical History:  Diagnosis Date   Bronchitis    Bronchitis    DVT (deep venous thrombosis) (HCC)    Gout    Hammertoe of second toe of right foot    Smoker     Past Surgical History:  Procedure Laterality Date   HAMMER TOE SURGERY Right 03/10/2016   Procedure: 2ND RIGHT HAMMER TOE CORRECTION;  Surgeon: Landis Martins, DPM;  Location: [place];  Service: Podiatry;  Laterality: Right;   MASS EXCISION Right 03/10/2016   Procedure: EXCISION OF CALLUS 2ND RIGHT TOE;  Surgeon: Landis Martins, DPM;  Location: [place];  Service: Podiatry;  Laterality: Right;   NO PAST SURGERIES      Family History  Problem Relation Age of Onset   Diabetes Mother    Healthy Father    Heart disease Neg Hx    Kidney disease Neg Hx     Social History   Socioeconomic History   Marital status: Single    Spouse name: Not on file   Number of children: Not on file   Years of education: Not on file   Highest education level: Not on file  Occupational History   Not on file  Tobacco Use   Smoking status: Former    Packs/day: 0.25    Types: Cigarettes    Quit date: 01/25/2019    Years since quitting: 1.8   Smokeless tobacco: Never   Tobacco comments:    8 cigs daily  Vaping Use   Vaping Use: Never used  Substance and Sexual Activity   Alcohol use: No   Drug  use: No   Sexual activity: Yes  Other Topics Concern   Not on file  Social History Narrative   Right handed   Social Determinants of Health   Financial Resource Strain: Not on file  Food Insecurity: Not on file  Transportation Needs: Not on file  Physical Activity: Not on file  Stress: Not on file  Social Connections: Not on file  Intimate Partner Violence: Not on file    Outpatient Medications Prior to Visit  Medication Sig Dispense Refill   allopurinol (ZYLOPRIM) 300 MG tablet TAKE 1 TABLET (300 MG TOTAL) BY MOUTH DAILY. 90 tablet 2   amLODipine (NORVASC) 5 MG tablet Take 1 tablet (5 mg total) by mouth daily. 90 tablet 1   amoxicillin (AMOXIL) 500 MG capsule TAKE 1 CAPSULE (500 MG TOTAL) BY MOUTH 3 (THREE) TIMES DAILY. (Patient not taking: Reported on 12/04/2020) 21 capsule 0   atorvastatin (LIPITOR) 40 MG tablet TAKE 1 TABLET BY MOUTH DAILY 30 tablet 3   Colchicine 0.6 MG CAPS TAKE TWO CAPSULES BY MOUTH AT ONSET OF A GOUT ATTACK, MAY REPEAT 1 CAPSULE IN 2 HOURS IF SYMPTOMS PERSIST (VIAL) 30 capsule 1   diclofenac  Sodium (VOLTAREN) 1 % GEL Apply 2 g topically 4 (four) times daily. (Patient not taking: Reported on 12/04/2020) 100 g 3   DULoxetine (CYMBALTA) 60 MG capsule TAKE 1 CAPSULE (60 MG TOTAL) BY MOUTH DAILY. FOR BACK 30 capsule 3   glipiZIDE (GLUCOTROL) 5 MG tablet Take 1 tablet (5 mg total) by mouth 2 (two) times daily before a meal. 60 tablet 3   iron polysaccharides (NIFEREX) 150 MG capsule Take by mouth.     lidocaine (LIDODERM) 5 % Place 1 patch onto the skin daily. Remove & Discard patch within 12 hours or as directed by MD (Patient not taking: Reported on 12/04/2020) 30 patch 1   lisinopril (ZESTRIL) 20 MG tablet Take 1 tablet (20 mg total) by mouth daily. 90 tablet 1   metFORMIN (GLUCOPHAGE) 1000 MG tablet Take 1 tablet (1,000 mg total) by mouth 2 (two) times daily with a meal. 180 tablet 1   methocarbamol (ROBAXIN-750) 750 MG tablet Take 1 tablet (750 mg total) by mouth  2 (two) times daily as needed for muscle spasms. 60 tablet 1   Misc. Devices MISC Nebulizer machine. Dx: Bronchitis 1 each 0   nortriptyline (PAMELOR) 25 MG capsule TAKE 1 CAPSULE BY MOUTH AT BEDTIME 90 capsule 0   omeprazole (PRILOSEC) 40 MG capsule Take 1 capsule (40 mg total) by mouth daily. 90 capsule 0   oxyCODONE-acetaminophen (PERCOCET/ROXICET) 5-325 MG tablet Take 1 tablet by mouth every 8 (eight) hours as needed for severe pain. (Patient not taking: Reported on 12/04/2020) 6 tablet 0   sildenafil (VIAGRA) 50 MG tablet TAKE 1 TABLET BY MOUTH DAILY AS NEEDED FOR ERECTILE DYSFUNCTION. AT LEAST 24 HOURS BETWEEN DOSES 4 tablet 2   trimethoprim-polymyxin b (POLYTRIM) ophthalmic solution Place 1 drop into the right eye every 6 (six) hours. 10 mL 0   No facility-administered medications prior to visit.    Allergies  Allergen Reactions   Vicodin [Hydrocodone-Acetaminophen] Hives    Patient stated he is no longer allergic to vicodin, patient stated that he was allergic about 12 years ago.     ROS Review of Systems  Constitutional:  Negative for chills and fever.  HENT:  Positive for congestion, rhinorrhea, sinus pressure and sinus pain. Negative for ear pain, sore throat and trouble swallowing.   Eyes: Negative.   Respiratory:  Positive for cough. Negative for shortness of breath and wheezing.   Cardiovascular:  Negative for chest pain.  Gastrointestinal:  Negative for abdominal pain, diarrhea, nausea and vomiting.  Endocrine: Negative.   Genitourinary: Negative.   Musculoskeletal:  Positive for myalgias.  Skin: Negative.   Allergic/Immunologic: Negative.   Neurological:  Positive for headaches. Negative for seizures and weakness.  Hematological: Negative.   Psychiatric/Behavioral: Negative.       Objective:    Physical Exam Constitutional:      General: He is not in acute distress.    Appearance: Normal appearance. He is not ill-appearing.  HENT:     Head: Normocephalic.      Right Ear: Tympanic membrane, ear canal and external ear normal.     Left Ear: Tympanic membrane, ear canal and external ear normal.     Nose:     Right Turbinates: Swollen.     Left Turbinates: Swollen.     Right Sinus: Maxillary sinus tenderness and frontal sinus tenderness present.     Left Sinus: Maxillary sinus tenderness and frontal sinus tenderness present.     Mouth/Throat:     Lips: Pink.  Mouth: Mucous membranes are moist.     Pharynx: Posterior oropharyngeal erythema present.  Eyes:     Extraocular Movements: Extraocular movements intact.     Conjunctiva/sclera: Conjunctivae normal.     Pupils: Pupils are equal, round, and reactive to light.  Cardiovascular:     Rate and Rhythm: Normal rate and regular rhythm.     Pulses: Normal pulses.     Heart sounds: Normal heart sounds.  Pulmonary:     Effort: Pulmonary effort is normal.     Breath sounds: Normal breath sounds. No wheezing.  Musculoskeletal:        General: Normal range of motion.     Cervical back: Normal range of motion and neck supple.  Lymphadenopathy:     Cervical: Cervical adenopathy present.  Skin:    General: Skin is warm and dry.  Neurological:     General: No focal deficit present.     Mental Status: He is alert and oriented to person, place, and time.  Psychiatric:        Mood and Affect: Mood normal.        Behavior: Behavior normal.        Thought Content: Thought content normal.        Judgment: Judgment normal.    BP (!) 146/90 (BP Location: Left Arm, Patient Position: Sitting, Cuff Size: Large)   Pulse (!) 101   Temp 98.5 F (36.9 C) (Oral)   Resp 20   Ht '5\' 9"'  (1.753 m)   Wt 239 lb (108.4 kg)   SpO2 97%   BMI 35.29 kg/m  Wt Readings from Last 3 Encounters:  12/10/20 239 lb (108.4 kg)  12/04/20 241 lb 2 oz (109.4 kg)  09/17/20 236 lb 1.8 oz (107.1 kg)     Health Maintenance Due  Topic Date Due   COVID-19 Vaccine (1) Never done   Hepatitis C Screening  Never done   Zoster  Vaccines- Shingrix (1 of 2) Never done   OPHTHALMOLOGY EXAM  08/26/2019    There are no preventive care reminders to display for this patient.  No results found for: TSH Lab Results  Component Value Date   WBC 7.9 09/17/2020   HGB 13.1 09/18/2020   HCT 40.8 09/18/2020   MCV 86.4 09/17/2020   PLT 278 09/17/2020   Lab Results  Component Value Date   NA 139 12/06/2020   K 4.6 12/06/2020   CO2 24 12/06/2020   GLUCOSE 174 (H) 12/06/2020   BUN 9 12/06/2020   CREATININE 1.10 12/06/2020   BILITOT 0.3 12/06/2020   ALKPHOS 146 (H) 12/06/2020   AST 23 12/06/2020   ALT 34 12/06/2020   PROT 7.8 12/06/2020   ALBUMIN 5.0 (H) 12/06/2020   CALCIUM 9.9 12/06/2020   ANIONGAP 12 09/17/2020   EGFR 79 12/06/2020   Lab Results  Component Value Date   CHOL 164 12/06/2020   Lab Results  Component Value Date   HDL 62 12/06/2020   Lab Results  Component Value Date   LDLCALC 83 12/06/2020   Lab Results  Component Value Date   TRIG 109 12/06/2020   Lab Results  Component Value Date   CHOLHDL 2.9 03/14/2019   Lab Results  Component Value Date   HGBA1C 8.5 (A) 12/04/2020      Assessment & Plan:   Problem List Items Addressed This Visit   None Visit Diagnoses     Acute non-recurrent frontal sinusitis    -  Primary   Relevant  Medications   azithromycin (ZITHROMAX) 250 MG tablet   benzonatate (TESSALON) 100 MG capsule   fluticasone (FLONASE) 50 MCG/ACT nasal spray   cetirizine (ZYRTEC) 10 MG tablet       Meds ordered this encounter  Medications   azithromycin (ZITHROMAX) 250 MG tablet    Sig: Take 2 tablets by mouth day 1, then take 1 tab by mouth once daily    Dispense:  6 tablet    Refill:  0    Order Specific Question:   Supervising Provider    Answer:   Elsie Stain [[id number]]   benzonatate (TESSALON) 100 MG capsule    Sig: Take 1 capsule (100 mg total) by mouth 2 (two) times daily as needed for cough.    Dispense:  20 capsule    Refill:  0    Order Specific  Question:   Supervising Provider    Answer:   Asencion Noble E [[id number]]   fluticasone (FLONASE) 50 MCG/ACT nasal spray    Sig: Place 2 sprays into both nostrils daily.    Dispense:  16 g    Refill:  6    Order Specific Question:   Supervising Provider    Answer:   Asencion Noble E [[id number]]   cetirizine (ZYRTEC) 10 MG tablet    Sig: Take 1 tablet (10 mg total) by mouth daily.    Dispense:  30 tablet    Refill:  11    Order Specific Question:   Supervising Provider    Answer:   Asencion Noble E [[id number]]   1. Acute non-recurrent frontal sinusitis Trial azithromycin, Tessalon Perles, Flonase, Zyrtec.  Patient education given on supportive care.  Red flags for prompt reevaluation. - azithromycin (ZITHROMAX) 250 MG tablet; Take 2 tablets by mouth day 1, then take 1 tab by mouth once daily  Dispense: 6 tablet; Refill: 0 - benzonatate (TESSALON) 100 MG capsule; Take 1 capsule (100 mg total) by mouth 2 (two) times daily as needed for cough.  Dispense: 20 capsule; Refill: 0 - fluticasone (FLONASE) 50 MCG/ACT nasal spray; Place 2 sprays into both nostrils daily.  Dispense: 16 g; Refill: 6 - cetirizine (ZYRTEC) 10 MG tablet; Take 1 tablet (10 mg total) by mouth daily.  Dispense: 30 tablet; Refill: 11   I have reviewed the patient's medical history (PMH, PSH, Social History, Family History, Medications, and allergies) , and have been updated if relevant. I spent 20 minutes reviewing chart and  face to face time with patient.    Follow-up: Return if symptoms worsen or fail to improve.    Loraine Grip Mayers, PA-C

## 2020-12-10 NOTE — Telephone Encounter (Signed)
Patient was called and a voicemail was left informing patient to return phone call for lab results. 

## 2020-12-11 ENCOUNTER — Encounter: Payer: Self-pay | Admitting: Physician Assistant

## 2020-12-21 NOTE — Telephone Encounter (Signed)
Error

## 2021-01-07 ENCOUNTER — Other Ambulatory Visit: Payer: Self-pay | Admitting: Family Medicine

## 2021-01-08 NOTE — Telephone Encounter (Signed)
Requested medication (s) are due for refill today:   Yes  Requested medication (s) are on the active medication list:   Yes  Future visit scheduled:   Yes   Last ordered: 10/12/2020 #90, 0 refills  Returned because an alternative is being requested by pharmacy   Requested Prescriptions  Pending Prescriptions Disp Refills   omeprazole (PRILOSEC) 40 MG capsule [Pharmacy Med Name: OMEPRAZOLE DR 40 MG CAPSULE 40 Capsule] 30 capsule 10    Sig: TAKE 1 CAPSULE BY MOUTH DAILY     Gastroenterology: Proton Pump Inhibitors Passed - 01/07/2021  6:54 PM      Passed - Valid encounter within last 12 months    Recent Outpatient Visits           4 weeks ago Acute non-recurrent frontal sinusitis   [place], Cari S, PA-C   1 month ago Type 2 diabetes mellitus with obesity ([place])   [place], Charlane Ferretti, MD   3 months ago Lower GI bleed   [place], [place], MD   3 months ago Type 2 diabetes mellitus with obesity [facility])   [place], Charlane Ferretti, MD   6 months ago Type 2 diabetes mellitus with obesity [facility])   [place], MD       Future Appointments             In 2 months [name], MD [place]

## 2021-02-04 ENCOUNTER — Emergency Department (HOSPITAL_COMMUNITY): Payer: Medicare Other

## 2021-02-04 ENCOUNTER — Emergency Department (HOSPITAL_COMMUNITY)
Admission: EM | Admit: 2021-02-04 | Discharge: 2021-02-05 | Disposition: A | Discharge status: Home / Self Care | Payer: Medicare Other | Attending: Student | Admitting: Student

## 2021-02-04 ENCOUNTER — Encounter (HOSPITAL_COMMUNITY): Payer: Self-pay

## 2021-02-04 ENCOUNTER — Other Ambulatory Visit: Payer: Self-pay

## 2021-02-04 DIAGNOSIS — Z5321 Procedure and treatment not carried out due to patient leaving prior to being seen by health care provider: Secondary | ICD-10-CM | POA: Diagnosis not present

## 2021-02-04 DIAGNOSIS — R519 Headache, unspecified: Secondary | ICD-10-CM | POA: Diagnosis not present

## 2021-02-04 DIAGNOSIS — S01111A Laceration without foreign body of right eyelid and periocular area, initial encounter: Secondary | ICD-10-CM | POA: Insufficient documentation

## 2021-02-04 DIAGNOSIS — S0591XA Unspecified injury of right eye and orbit, initial encounter: Secondary | ICD-10-CM | POA: Diagnosis present

## 2021-02-04 LAB — ETHANOL: Alcohol, Ethyl (B): 231 mg/dL — ABNORMAL HIGH (ref <10)

## 2021-02-04 MED ORDER — TETANUS-DIPHTH-ACELL PERTUSSIS 5-2.5-18.5 LF-MCG/0.5 IM SUSY: 0.5000 mL | PREFILLED_SYRINGE | Freq: Once | INTRAMUSCULAR | Status: DC
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
## 2021-02-05 ENCOUNTER — Other Ambulatory Visit: Payer: Self-pay | Admitting: Internal Medicine

## 2021-02-05 ENCOUNTER — Other Ambulatory Visit: Payer: Self-pay | Admitting: Family Medicine

## 2021-02-05 DIAGNOSIS — E1169 Type 2 diabetes mellitus with other specified complication: Secondary | ICD-10-CM

## 2021-02-05 DIAGNOSIS — M1712 Unilateral primary osteoarthritis, left knee: Secondary | ICD-10-CM

## 2021-02-05 DIAGNOSIS — G8929 Other chronic pain: Secondary | ICD-10-CM

## 2021-02-05 DIAGNOSIS — M545 Low back pain, unspecified: Secondary | ICD-10-CM

## 2021-02-05 NOTE — Progress Notes (Deleted)
NEUROLOGY FOLLOW UP OFFICE NOTE  Terry Poole [id number]  Assessment/Plan:   Chronic tension-type headache, not intractable  Migraine prevention:  *** Migraine rescue:  *** Limit use of pain relievers to no more than 2 days out of week to prevent risk of rebound or medication-overuse headache. Keep headache diary Follow up ***   Subjective:  Terry Poole is a 56 year old right-handed male with type 2 diabetes mellitus, gout, HTN, depression, s/p R TKR and history of bilateral PE and DVT who follows up for headache.  UPDATE: Intensity:  *** Duration:  *** Frequency:  *** Frequency of abortive medication: *** Current NSAIDS/analgesics:  Tylenol (daily) Current triptans:  none Current ergotamine:  none Current anti-emetic:  none Current muscle relaxants:  Tizanidine 4mg  daily (back pain) Current Antihypertensive medications:  lisinopril Current Antidepressant medications:  Nortriptyline 25mg  QHS, Cymbalta 60mg  daily (for back pain) Current Anticonvulsant medications:  topiramate 100mg  BID Current anti-CGRP:  none Current Vitamins/Herbal/Supplements:  none Current Antihistamines/Decongestants:  none Other therapy:  none Hormone/birth control:  none Other medications:  none  Caffeine:  *** Alcohol:  *** Smoker:  *** Diet:  *** Exercise:  *** Depression:  ***; Anxiety:  *** Other pain:  *** Sleep hygiene:  ***  HISTORY:  Headaches started at age 65 after head injury.  Required steel plate and was told that he had brain tissue removed and would have brain damage.  He has a persistent daily headaches a moderate throbbing headache across the back of his head. No associated visual disturbance, nausea, vomiting, photophobia, phonophobia, numbness or weakness.   Due to worsening headaches, he had a CT head without contrast on 11/23/2019 which was personally reviewed and was unremarkable.   Past NSAIDS/analgesics:  Ibuprofen, Excedrin, naproxen, tramadol Past abortive  triptans:  none Past abortive ergotamine:  none Past muscle relaxants:  none Past anti-emetic:  none Past antihypertensive medications:  none Past antidepressant medications:  fluoxetine Past anticonvulsant medications:  gabapentin Past anti-CGRP:  none Past vitamins/Herbal/Supplements:  none Past antihistamines/decongestants:  none Other past therapies:  none  PAST MEDICAL HISTORY: Past Medical History:  Diagnosis Date   Bronchitis    Bronchitis    DVT (deep venous thrombosis) (HCC)    Gout    Hammertoe of second toe of right foot    Smoker     MEDICATIONS: Current Facility-Administered Medications on File Prior to Visit  Medication Dose Route Frequency Provider Last Rate Last Admin   Tdap (BOOSTRIX) injection 0.5 mL  0.5 mL Intramuscular Once Suzy Bouchard, PA-C       Current Outpatient Medications on File Prior to Visit  Medication Sig Dispense Refill   allopurinol (ZYLOPRIM) 300 MG tablet TAKE 1 TABLET (300 MG TOTAL) BY MOUTH DAILY. 90 tablet 2   amLODipine (NORVASC) 5 MG tablet Take 1 tablet (5 mg total) by mouth daily. 90 tablet 1   amoxicillin (AMOXIL) 500 MG capsule TAKE 1 CAPSULE (500 MG TOTAL) BY MOUTH 3 (THREE) TIMES DAILY. (Patient not taking: Reported on 12/04/2020) 21 capsule 0   atorvastatin (LIPITOR) 40 MG tablet TAKE 1 TABLET BY MOUTH DAILY 30 tablet 3   azithromycin (ZITHROMAX) 250 MG tablet Take 2 tablets by mouth day 1, then take 1 tab by mouth once daily 6 tablet 0   benzonatate (TESSALON) 100 MG capsule Take 1 capsule (100 mg total) by mouth 2 (two) times daily as needed for cough. 20 capsule 0   cetirizine (ZYRTEC) 10 MG tablet Take 1 tablet (10 mg total)  by mouth daily. 30 tablet 11   Colchicine 0.6 MG CAPS TAKE TWO CAPSULES BY MOUTH AT ONSET OF A GOUT ATTACK, MAY REPEAT 1 CAPSULE IN 2 HOURS IF SYMPTOMS PERSIST (VIAL) 30 capsule 1   diclofenac Sodium (VOLTAREN) 1 % GEL Apply 2 g topically 4 (four) times daily. (Patient not taking: Reported on  12/04/2020) 100 g 3   DULoxetine (CYMBALTA) 60 MG capsule TAKE 1 CAPSULE (60 MG TOTAL) BY MOUTH DAILY. FOR BACK 30 capsule 3   fluticasone (FLONASE) 50 MCG/ACT nasal spray Place 2 sprays into both nostrils daily. 16 g 6   glipiZIDE (GLUCOTROL) 5 MG tablet Take 1 tablet (5 mg total) by mouth 2 (two) times daily before a meal. 60 tablet 3   iron polysaccharides (NIFEREX) 150 MG capsule Take by mouth.     lidocaine (LIDODERM) 5 % Place 1 patch onto the skin daily. Remove & Discard patch within 12 hours or as directed by MD (Patient not taking: Reported on 12/04/2020) 30 patch 1   lisinopril (ZESTRIL) 20 MG tablet Take 1 tablet (20 mg total) by mouth daily. 90 tablet 1   metFORMIN (GLUCOPHAGE) 1000 MG tablet Take 1 tablet (1,000 mg total) by mouth 2 (two) times daily with a meal. 180 tablet 1   methocarbamol (ROBAXIN-750) 750 MG tablet Take 1 tablet (750 mg total) by mouth 2 (two) times daily as needed for muscle spasms. 60 tablet 1   Misc. Devices MISC Nebulizer machine. Dx: Bronchitis 1 each 0   nortriptyline (PAMELOR) 25 MG capsule TAKE 1 CAPSULE BY MOUTH AT BEDTIME 90 capsule 0   omeprazole (PRILOSEC) 40 MG capsule TAKE 1 CAPSULE BY MOUTH DAILY 30 capsule 10   oxyCODONE-acetaminophen (PERCOCET/ROXICET) 5-325 MG tablet Take 1 tablet by mouth every 8 (eight) hours as needed for severe pain. (Patient not taking: Reported on 12/04/2020) 6 tablet 0   sildenafil (VIAGRA) 50 MG tablet TAKE 1 TABLET BY MOUTH DAILY AS NEEDED FOR ERECTILE DYSFUNCTION. AT LEAST 24 HOURS BETWEEN DOSES 4 tablet 2   trimethoprim-polymyxin b (POLYTRIM) ophthalmic solution Place 1 drop into the right eye every 6 (six) hours. 10 mL 0   [DISCONTINUED] FLUoxetine (PROZAC) 20 MG tablet Take 1 tablet (20 mg total) by mouth daily. 30 tablet 6    ALLERGIES: Allergies  Allergen Reactions   Vicodin [Hydrocodone-Acetaminophen] Hives    Patient stated he is no longer allergic to vicodin, patient stated that he was allergic about 12 years  ago.     FAMILY HISTORY: Family History  Problem Relation Age of Onset   Diabetes Mother    Healthy Father    Heart disease Neg Hx    Kidney disease Neg Hx       Objective:  *** General: No acute distress.  Patient appears ***-groomed.   Head:  Normocephalic/atraumatic Eyes:  Fundi examined but not visualized Neck: supple, no paraspinal tenderness, full range of motion Heart:  Regular rate and rhythm Lungs:  Clear to auscultation bilaterally Back: No paraspinal tenderness Neurological Exam: alert and oriented to person, place, and time.  Speech fluent and not dysarthric, language intact.  CN II-XII intact. Bulk and tone normal, muscle strength 5/5 throughout.  Sensation to light touch intact.  Deep tendon reflexes 2+ throughout, toes downgoing.  Finger to nose testing intact.  Gait normal, Romberg negative.   Metta Clines, DO  CC: ***

## 2021-02-05 NOTE — ED Notes (Signed)
Pt. Called for  reassess vital x3 with no response. Nurses aware.

## 2021-02-05 NOTE — ED Notes (Signed)
Pt called x3 in ED lobby. Currently not present and did not respond to name call at noted times, triage RN notified. Pt could not be located within ED lobby.  °

## 2021-02-06 ENCOUNTER — Telehealth: Payer: Self-pay | Admitting: Clinical

## 2021-02-06 ENCOUNTER — Ambulatory Visit: Payer: Self-pay | Admitting: *Deleted

## 2021-02-06 ENCOUNTER — Ambulatory Visit: Payer: No Typology Code available for payment source | Admitting: Neurology

## 2021-02-06 ENCOUNTER — Encounter: Payer: Self-pay | Admitting: Neurology

## 2021-02-06 DIAGNOSIS — Z029 Encounter for administrative examinations, unspecified: Secondary | ICD-10-CM

## 2021-02-06 NOTE — Telephone Encounter (Signed)
Officer Marina Gravel contacted me and stated that pt was not at home. He stated that his wife Kelli Churn was home and she reports that he is "fine".

## 2021-02-06 NOTE — Telephone Encounter (Signed)
Per telephone note in pt's chart with self-injurous behavior on 02/07/20 I attempted to call pt at primary number listed, no answer, left vm. No answer on contact #2. I contacted [employer] for a wellness check to be completed. I spoke with Officer Cobb and provided my contact information.

## 2021-02-06 NOTE — Telephone Encounter (Signed)
Attempt to schedule patient for sooner apt., virtual with Dr. Margarita Rana for 02/12/2021 at 0810.  LVM to return call.  No answer on contact #2

## 2021-02-06 NOTE — Telephone Encounter (Signed)
Chief Complaint: depression, feeling like hurting self sometimes  Symptoms: depressed, headaches, not getting to work around the house  Frequency: recently  Pertinent Negatives: Patient denies suicide ideation now, headache now, no lightheadedness, not hurting others now  Disposition: [] ED /[] Urgent Care (no appt availability in office) / [] Appointment(In office/virtual)/ []  [facility]/ [] Home Care/ [] Refused Recommended Disposition /[] [facility]/ [x]  Follow-up with PCP Additional Notes:  C/o headache couple of nights ago beating head against dresser. Patient reports he is currently with family at home and they are supportive to his needs. Gave urgent crisis center # to patient and suicide hotline [phone]. Requesting to talk with counselor or "someone"he "used to talk to in the office that is now gone". Please advise if appt available sooner than scheduled appt for 03/11/21. Message sent to [facility]. Please advise           Reason for Disposition  Sometimes has thoughts of suicide  Answer Assessment - Initial Assessment Questions 1. CONCERN: "What happened that made you call today?"     Feeling depressed, nothing going right in life, having headaches, hard to function, feelings of hurting self at times but not now  2. DEPRESSION SYMPTOM SCREENING: "How are you feeling overall?" (e.g., decreased energy, increased sleeping or difficulty sleeping, difficulty concentrating, feelings of sadness, guilt, hopelessness, or worthlessness)     Sleeps ok feeling depressed  3. RISK OF HARM - SUICIDAL IDEATION:  "Do you ever have thoughts of hurting or killing yourself?"  (e.g., yes, no, no but preoccupation with thoughts about death)   - INTENT:  "Do you have thoughts of hurting or killing yourself right NOW?" (e.g., yes, no, N/A)   - PLAN: "Do you have a specific plan for how you would do this?" (e.g., gun, knife, overdose, no plan, N/A)     Thoughts of hurting self denies now.  A couple of nights ago headache noted and started beating head against a dresser. 4. RISK OF HARM - HOMICIDAL IDEATION:  "Do you ever have thoughts of hurting or killing someone else?"  (e.g., yes, no, no but preoccupation with thoughts about death)   - INTENT:  "Do you have thoughts of hurting or killing someone right NOW?" (e.g., yes, no, N/A)   - PLAN: "Do you have a specific plan for how you would do this?" (e.g., gun, knife, no plan, N/A)      Sometimes but reports more of hurting self  5. FUNCTIONAL IMPAIRMENT: "How have things been going for you overall? Have you had more difficulty than usual doing your normal daily activities?"  (e.g., better, same, worse; self-care, school, work, interactions)     Not able to do work around the house  6. SUPPORT: "Who is with you now?" "Who do you live with?" "Do you have family or friends who you can talk to?"      Family is with patient now, reports having family and friends that try to help him 87. THERAPIST: "Do you have a counselor or therapist? Name?"     no 8. STRESSORS: "Has there been any new stress or recent changes in your life?"     Denies other than not able to work around his house, ex weather  9. ALCOHOL USE OR SUBSTANCE USE (DRUG USE): "Do you drink alcohol or use any illegal drugs?"     Denies  10. OTHER: "Do you have any other physical symptoms right now?" (e.g., fever)       Denies ,  but when having headache feels lightheaded 11. PREGNANCY: "Is there any chance you are pregnant?" "When was your last menstrual period?"       na  Protocols used: Depression-A-AH

## 2021-03-11 ENCOUNTER — Ambulatory Visit: Payer: Medicare Other | Admitting: Family Medicine

## 2021-04-04 ENCOUNTER — Other Ambulatory Visit: Payer: Self-pay | Admitting: Family Medicine

## 2021-04-04 DIAGNOSIS — M545 Low back pain, unspecified: Secondary | ICD-10-CM

## 2021-04-04 DIAGNOSIS — E1159 Type 2 diabetes mellitus with other circulatory complications: Secondary | ICD-10-CM

## 2021-04-04 DIAGNOSIS — M1712 Unilateral primary osteoarthritis, left knee: Secondary | ICD-10-CM

## 2021-04-04 DIAGNOSIS — G8929 Other chronic pain: Secondary | ICD-10-CM

## 2021-04-04 DIAGNOSIS — E1169 Type 2 diabetes mellitus with other specified complication: Secondary | ICD-10-CM

## 2021-04-09 ENCOUNTER — Other Ambulatory Visit: Payer: Self-pay | Admitting: Family Medicine

## 2021-04-09 DIAGNOSIS — E1169 Type 2 diabetes mellitus with other specified complication: Secondary | ICD-10-CM

## 2021-04-09 DIAGNOSIS — E669 Obesity, unspecified: Secondary | ICD-10-CM

## 2021-05-05 ENCOUNTER — Other Ambulatory Visit: Payer: Self-pay | Admitting: Family Medicine

## 2021-05-05 DIAGNOSIS — I152 Hypertension secondary to endocrine disorders: Secondary | ICD-10-CM

## 2021-05-05 DIAGNOSIS — M1712 Unilateral primary osteoarthritis, left knee: Secondary | ICD-10-CM

## 2021-05-05 DIAGNOSIS — G8929 Other chronic pain: Secondary | ICD-10-CM

## 2021-05-05 DIAGNOSIS — E1169 Type 2 diabetes mellitus with other specified complication: Secondary | ICD-10-CM

## 2021-05-07 NOTE — Telephone Encounter (Signed)
Requested medication (s) are due for refill today: yes ? ?Requested medication (s) are on the active medication list: yes ? ?Last refill:  04/05/21 #30/0 ? ?Future visit scheduled: no ? ?Notes to clinic:  pt is overdue for an appt. Pt called, LVMTCB ? ? ?  ?Requested Prescriptions  ?Pending Prescriptions Disp Refills  ? lisinopril (ZESTRIL) 20 MG tablet [Pharmacy Med Name: LISINOPRIL 20 MG TABLET 20 Tablet] 30 tablet 10  ?  Sig: TAKE 1 TABLET BY MOUTH DAILY *PATIENT NEEDS APPOINTMENT*  ?  ? Cardiovascular:  ACE Inhibitors Failed - 05/05/2021  8:59 PM  ?  ?  Failed - Last BP in normal range  ?  BP Readings from Last 1 Encounters:  ?02/05/21 (!) 209/176  ?  ?  ?  ?  Passed - Cr in normal range and within 180 days  ?  Creatinine  ?Date Value Ref Range Status  ?09/22/2018 1.21 0.61 - 1.24 mg/dL Final  ? ?Creat  ?Date Value Ref Range Status  ?09/21/2015 0.94 0.70 - 1.33 mg/dL Final  ?  Comment:  ?    ?For patients > or = 56 years of age: The upper reference limit for ?Creatinine is approximately 13% higher for people identified as ?African-American. ?  ?  ? ?Creatinine, Ser  ?Date Value Ref Range Status  ?12/06/2020 1.10 0.76 - 1.27 mg/dL Final  ? ?Creatinine, Urine  ?Date Value Ref Range Status  ?09/21/2015 171 20 - 370 mg/dL Final  ?  ?  ?  ?  Passed - K in normal range and within 180 days  ?  Potassium  ?Date Value Ref Range Status  ?12/06/2020 4.6 3.5 - 5.2 mmol/L Final  ?  ?  ?  ?  Passed - Patient is not pregnant  ?  ?  Passed - Valid encounter within last 6 months  ?  Recent Outpatient Visits   ? ?      ? 4 months ago Acute non-recurrent frontal sinusitis  ? [place], [place]  ? 5 months ago Type 2 diabetes mellitus with obesity ([place])  ? [place], Charlane Ferretti, MD  ? 7 months ago Lower GI bleed  ? [place], Charlane Ferretti, MD  ? 7 months ago Type 2 diabetes mellitus with obesity ([place])  ? [place], Charlane Ferretti, MD  ? 10 months ago Type 2 diabetes mellitus with obesity ([place])  ? [place] Ladell Pier, MD  ? ?  ?  ? ?  ?  ?  ? DULoxetine (CYMBALTA) 60 MG capsule [Pharmacy Med Name: DULOXETINE 60MG CAPSULE 60 Capsule] 30 capsule 10  ?  Sig: TAKE 1 CAPSULE BY MOUTH DAILY FOR BACK *PATIENT NEEDS APPOINTMENT*  ?  ? Psychiatry: Antidepressants - SNRI - duloxetine Failed - 05/05/2021  8:59 PM  ?  ?  Failed - Last BP in normal range  ?  BP Readings from Last 1 Encounters:  ?02/05/21 (!) 209/176  ?  ?  ?  ?  Passed - Cr in normal range and within 360 days  ?  Creatinine  ?Date Value Ref Range Status  ?09/22/2018 1.21 0.61 - 1.24 mg/dL Final  ? ?Creat  ?Date Value Ref Range Status  ?09/21/2015 0.94 0.70 - 1.33 mg/dL Final  ?  Comment:  ?    ?For patients > or = 56 years of  age: The upper reference limit for ?Creatinine is approximately 13% higher for people identified as ?African-American. ?  ?  ? ?Creatinine, Ser  ?Date Value Ref Range Status  ?12/06/2020 1.10 0.76 - 1.27 mg/dL Final  ? ?Creatinine, Urine  ?Date Value Ref Range Status  ?09/21/2015 171 20 - 370 mg/dL Final  ?  ?  ?  ?  Passed - eGFR is 30 or above and within 360 days  ?  GFR, Est African American  ?Date Value Ref Range Status  ?09/21/2015 >89 >=60 mL/min Final  ? ?GFR, Est AFR Am  ?Date Value Ref Range Status  ?09/22/2018 >60 >60 mL/min Final  ? ?GFR calc Af Amer  ?Date Value Ref Range Status  ?10/25/2019 97 >59 mL/min/1.73 Final  ?  Comment:  ?  **[facility] currently reports eGFR in compliance with the current** ?  recommendations of the [employer]. [facility] will ?  update reporting as new guidelines are published from the NKF-ASN ?  Task force. ?  ? ?GFR, Est Non African American  ?Date Value Ref Range Status  ?09/21/2015 >89 >=60 mL/min Final  ? ?GFR, Estimated  ?Date Value Ref Range Status  ?09/17/2020 >60 >60 mL/min Final  ?  Comment:  ?  (NOTE) ?Calculated using the  CKD-EPI Creatinine Equation (2021) ?  ?09/22/2018 >60 >60 mL/min Final  ? ?eGFR  ?Date Value Ref Range Status  ?12/06/2020 79 >59 mL/min/1.73 Final  ?  ?  ?  ?  Passed - Completed PHQ-2 or PHQ-9 in the last 360 days  ?  ?  Passed - Valid encounter within last 6 months  ?  Recent Outpatient Visits   ? ?      ? 4 months ago Acute non-recurrent frontal sinusitis  ? [place], [place]  ? 5 months ago Type 2 diabetes mellitus with obesity ([place])  ? [place], Charlane Ferretti, MD  ? 7 months ago Lower GI bleed  ? [place], Charlane Ferretti, MD  ? 7 months ago Type 2 diabetes mellitus with obesity ([place])  ? [place], Charlane Ferretti, MD  ? 10 months ago Type 2 diabetes mellitus with obesity ([place])  ? [place] Ladell Pier, MD  ? ?  ?  ? ?  ?  ?  ? atorvastatin (LIPITOR) 40 MG tablet [Pharmacy Med Name: ATORVASTATIN 40MG TAB 40 Tablet] 30 tablet 10  ?  Sig: TAKE 1 TABLET BY MOUTH DAILY *PATIENT NEEDS APPOINTMENT FOR ADDITIONAL REFILLS*  ?  ? Cardiovascular:  Antilipid - Statins Failed - 05/05/2021  8:59 PM  ?  ?  Failed - Lipid Panel in normal range within the last 12 months  ?  Cholesterol, Total  ?Date Value Ref Range Status  ?12/06/2020 164 100 - 199 mg/dL Final  ? ?LDL Chol Calc (NIH)  ?Date Value Ref Range Status  ?12/06/2020 83 0 - 99 mg/dL Final  ? ?HDL  ?Date Value Ref Range Status  ?12/06/2020 62 >39 mg/dL Final  ? ?Triglycerides  ?Date Value Ref Range Status  ?12/06/2020 109 0 - 149 mg/dL Final  ? ?  ?  ?  Passed - Patient is not pregnant  ?  ?  Passed - Valid encounter within last 12 months  ?  Recent Outpatient Visits   ? ?      ? 4 months ago Acute non-recurrent  frontal sinusitis  ? [place], [place]  ? 5 months ago Type 2 diabetes mellitus with obesity ([place])  ? [place], Charlane Ferretti, MD  ? 7 months ago Lower GI bleed  ? [place], Charlane Ferretti, MD  ? 7 months ago Type 2 diabetes mellitus with obesity ([place])  ? [place], Charlane Ferretti, MD  ? 10 months ago Type 2 diabetes mellitus with obesity ([place])  ? [place] Ladell Pier, MD  ? ?  ?  ? ?  ?  ?  ? ?

## 2021-05-07 NOTE — Telephone Encounter (Signed)
Patient called, left VM to return the call to the office to scheduled an appt for medication refill request.   

## 2021-05-12 ENCOUNTER — Other Ambulatory Visit: Payer: Self-pay | Admitting: Family Medicine

## 2021-05-12 DIAGNOSIS — M1712 Unilateral primary osteoarthritis, left knee: Secondary | ICD-10-CM

## 2021-05-12 DIAGNOSIS — G8929 Other chronic pain: Secondary | ICD-10-CM

## 2021-05-12 DIAGNOSIS — E1169 Type 2 diabetes mellitus with other specified complication: Secondary | ICD-10-CM

## 2021-05-12 DIAGNOSIS — I152 Hypertension secondary to endocrine disorders: Secondary | ICD-10-CM

## 2021-05-12 DIAGNOSIS — E1159 Type 2 diabetes mellitus with other circulatory complications: Secondary | ICD-10-CM

## 2021-05-20 ENCOUNTER — Other Ambulatory Visit: Payer: Self-pay | Admitting: Family Medicine

## 2021-05-20 DIAGNOSIS — G8929 Other chronic pain: Secondary | ICD-10-CM

## 2021-05-20 DIAGNOSIS — E1169 Type 2 diabetes mellitus with other specified complication: Secondary | ICD-10-CM

## 2021-05-20 DIAGNOSIS — E1159 Type 2 diabetes mellitus with other circulatory complications: Secondary | ICD-10-CM

## 2021-05-20 DIAGNOSIS — M1712 Unilateral primary osteoarthritis, left knee: Secondary | ICD-10-CM

## 2021-05-20 DIAGNOSIS — I152 Hypertension secondary to endocrine disorders: Secondary | ICD-10-CM

## 2021-05-20 NOTE — Telephone Encounter (Signed)
Message left requesting pt to schedule appt so his rx can be filled. ?

## 2021-05-20 NOTE — Telephone Encounter (Signed)
Can not reach pt to schedule appt. Have tried all numbers available, message left and no response. ?

## 2021-05-21 ENCOUNTER — Telehealth: Payer: Self-pay

## 2021-05-21 NOTE — Telephone Encounter (Signed)
Contacted pt to schedule his Welcome to Medicare visit with his pcp pt didn't answer lvm  ?

## 2021-06-04 ENCOUNTER — Other Ambulatory Visit: Payer: Self-pay | Admitting: Family Medicine

## 2021-06-04 DIAGNOSIS — M1A0720 Idiopathic chronic gout, left ankle and foot, without tophus (tophi): Secondary | ICD-10-CM

## 2021-06-10 ENCOUNTER — Other Ambulatory Visit: Payer: Self-pay | Admitting: Family Medicine

## 2021-06-10 DIAGNOSIS — M1A0720 Idiopathic chronic gout, left ankle and foot, without tophus (tophi): Secondary | ICD-10-CM

## 2021-06-11 NOTE — Telephone Encounter (Signed)
Requested medications are due for refill today.  yes ? ?Requested medications are on the active medications list.  yes ? ?Last refill. 10/12/2020 #90 2 refills ? ?Future visit scheduled.   no ? ?Notes to clinic.  Medication refill failed protocol d/t missing labs. ? ? ? ?Requested Prescriptions  ?Pending Prescriptions Disp Refills  ? allopurinol (ZYLOPRIM) 300 MG tablet [Pharmacy Med Name: ALLOPURINOL 300 MG TABS 300 Tablet] 30 tablet 10  ?  Sig: TAKE 1 TABLET BY MOUTH DAILY  ?  ? Endocrinology:  Gout Agents - allopurinol Failed - 06/10/2021  7:19 PM  ?  ?  Failed - Uric Acid in normal range and within 360 days  ?  Uric Acid  ?Date Value Ref Range Status  ?07/27/2018 6.0 3.7 - 8.6 mg/dL Final  ?  Comment:  ?             Therapeutic target for gout patients: <6.0  ?  ?  ?  ?  Passed - Cr in normal range and within 360 days  ?  Creatinine  ?Date Value Ref Range Status  ?09/22/2018 1.21 0.61 - 1.24 mg/dL Final  ? ?Creat  ?Date Value Ref Range Status  ?09/21/2015 0.94 0.70 - 1.33 mg/dL Final  ?  Comment:  ?    ?For patients > or = 56 years of age: The upper reference limit for ?Creatinine is approximately 13% higher for people identified as ?African-American. ?  ?  ? ?Creatinine, Ser  ?Date Value Ref Range Status  ?12/06/2020 1.10 0.76 - 1.27 mg/dL Final  ? ?Creatinine, Urine  ?Date Value Ref Range Status  ?09/21/2015 171 20 - 370 mg/dL Final  ?  ?  ?  ?  Passed - Valid encounter within last 12 months  ?  Recent Outpatient Visits   ? ?      ? 6 months ago Acute non-recurrent frontal sinusitis  ? [place], [place]  ? 6 months ago Type 2 diabetes mellitus with obesity ([place])  ? [place], Charlane Ferretti, MD  ? 8 months ago Lower GI bleed  ? [place], Charlane Ferretti, MD  ? 9 months ago Type 2 diabetes mellitus with obesity ([place])  ? [place], Charlane Ferretti, MD  ? 11 months ago Type 2  diabetes mellitus with obesity ([place])  ? [place] Ladell Pier, MD  ? ?  ?  ? ? ?  ?  ?  Passed - CBC within normal limits and completed in the last 12 months  ?  WBC  ?Date Value Ref Range Status  ?09/17/2020 7.9 4.0 - 10.5 K/uL Final  ? ?RBC  ?Date Value Ref Range Status  ?09/17/2020 4.64 4.22 - 5.81 MIL/uL Final  ? ?Hemoglobin  ?Date Value Ref Range Status  ?09/18/2020 13.1 13.0 - 17.0 g/dL Final  ?03/18/2019 13.0 13.0 - 17.7 g/dL Final  ? ?HCT  ?Date Value Ref Range Status  ?09/18/2020 40.8 39.0 - 52.0 % Final  ?  Comment:  ?  Performed at [facility] [address], [place], [place] [address]  ? ?Hematocrit  ?Date Value Ref Range Status  ?03/18/2019 39.8 37.5 - 51.0 % Final  ? ?MCHC  ?Date Value Ref Range Status  ?09/17/2020 32.2 30.0 - 36.0 g/dL Final  ? ?MCH  ?Date Value Ref Range Status  ?09/17/2020 27.8 26.0 -  34.0 pg Final  ? ?MCV  ?Date Value Ref Range Status  ?09/17/2020 86.4 80.0 - 100.0 fL Final  ?03/18/2019 85 79 - 97 fL Final  ? ?No results found for: PLTCOUNTKUC, LABPLAT, [place] ?RDW  ?Date Value Ref Range Status  ?09/17/2020 15.1 11.5 - 15.5 % Final  ?03/18/2019 14.0 11.6 - 15.4 % Final  ? ?  ?  ?  ?  ?

## 2021-06-23 ENCOUNTER — Other Ambulatory Visit: Payer: Self-pay | Admitting: Family Medicine

## 2021-06-23 DIAGNOSIS — M1A0720 Idiopathic chronic gout, left ankle and foot, without tophus (tophi): Secondary | ICD-10-CM

## 2021-06-25 NOTE — Telephone Encounter (Signed)
Requested medication (s) are due for refill today: yes  Requested medication (s) are on the active medication list: yes  Last refill:  10/12/20  Future visit scheduled: no  Notes to clinic:  called pt but unable to LM on VM    Requested Prescriptions  Pending Prescriptions Disp Refills   allopurinol (ZYLOPRIM) 300 MG tablet [Pharmacy Med Name: ALLOPURINOL 300 MG TABS 300 Tablet] 30 tablet 10    Sig: TAKE 1 TABLET BY MOUTH DAILY     Endocrinology:  Gout Agents - allopurinol Failed - 06/23/2021  9:12 PM      Failed - Uric Acid in normal range and within 360 days    Uric Acid  Date Value Ref Range Status  07/27/2018 6.0 3.7 - 8.6 mg/dL Final    Comment:               Therapeutic target for gout patients: <6.0         Passed - Cr in normal range and within 360 days    Creatinine  Date Value Ref Range Status  09/22/2018 1.21 0.61 - 1.24 mg/dL Final   Creat  Date Value Ref Range Status  09/21/2015 0.94 0.70 - 1.33 mg/dL Final    Comment:      For patients > or = 56 years of age: The upper reference limit for Creatinine is approximately 13% higher for people identified as African-American.      Creatinine, Ser  Date Value Ref Range Status  12/06/2020 1.10 0.76 - 1.27 mg/dL Final   Creatinine, Urine  Date Value Ref Range Status  09/21/2015 171 20 - 370 mg/dL Final         Passed - Valid encounter within last 12 months    Recent Outpatient Visits           6 months ago Acute non-recurrent frontal sinusitis   [place], Cari S, PA-C   6 months ago Type 2 diabetes mellitus with obesity ([place])   [place], Charlane Ferretti, MD   9 months ago Lower GI bleed   [place], [place], MD   9 months ago Type 2 diabetes mellitus with obesity ([place])   [place], [place], MD   12 months ago Type 2 diabetes mellitus with obesity [facility])    [place] Ladell Pier, MD               Passed - CBC within normal limits and completed in the last 12 months    WBC  Date Value Ref Range Status  09/17/2020 7.9 4.0 - 10.5 K/uL Final   RBC  Date Value Ref Range Status  09/17/2020 4.64 4.22 - 5.81 MIL/uL Final   Hemoglobin  Date Value Ref Range Status  09/18/2020 13.1 13.0 - 17.0 g/dL Final  03/18/2019 13.0 13.0 - 17.7 g/dL Final   HCT  Date Value Ref Range Status  09/18/2020 40.8 39.0 - 52.0 % Final    Comment:    Performed at [facility], [place] [address] [place], [place] [address]   Hematocrit  Date Value Ref Range Status  03/18/2019 39.8 37.5 - 51.0 % Final   MCHC  Date Value Ref Range Status  09/17/2020 32.2 30.0 - 36.0 g/dL Final   [facility]  Date Value Ref Range Status  09/17/2020 27.8 26.0 - 34.0 pg Final  MCV  Date Value Ref Range Status  09/17/2020 86.4 80.0 - 100.0 fL Final  03/18/2019 85 79 - 97 fL Final   No results found for: PLTCOUNTKUC, LABPLAT, POCPLA RDW  Date Value Ref Range Status  09/17/2020 15.1 11.5 - 15.5 % Final  03/18/2019 14.0 11.6 - 15.4 % Final

## 2021-06-26 ENCOUNTER — Other Ambulatory Visit: Payer: Self-pay | Admitting: Family Medicine

## 2021-06-26 DIAGNOSIS — M1A0720 Idiopathic chronic gout, left ankle and foot, without tophus (tophi): Secondary | ICD-10-CM

## 2021-07-04 ENCOUNTER — Other Ambulatory Visit: Payer: Self-pay | Admitting: Family Medicine

## 2021-07-04 DIAGNOSIS — M1A0720 Idiopathic chronic gout, left ankle and foot, without tophus (tophi): Secondary | ICD-10-CM

## 2021-07-10 ENCOUNTER — Other Ambulatory Visit: Payer: Self-pay | Admitting: Family Medicine

## 2021-07-10 DIAGNOSIS — M1A0720 Idiopathic chronic gout, left ankle and foot, without tophus (tophi): Secondary | ICD-10-CM

## 2021-07-16 ENCOUNTER — Other Ambulatory Visit: Payer: Self-pay | Admitting: Family Medicine

## 2021-07-16 DIAGNOSIS — M1A0720 Idiopathic chronic gout, left ankle and foot, without tophus (tophi): Secondary | ICD-10-CM

## 2021-07-23 ENCOUNTER — Other Ambulatory Visit: Payer: Self-pay | Admitting: Family Medicine

## 2021-07-23 DIAGNOSIS — M1A0720 Idiopathic chronic gout, left ankle and foot, without tophus (tophi): Secondary | ICD-10-CM

## 2021-07-24 ENCOUNTER — Other Ambulatory Visit: Payer: Self-pay | Admitting: Family Medicine

## 2021-07-24 DIAGNOSIS — M1A0720 Idiopathic chronic gout, left ankle and foot, without tophus (tophi): Secondary | ICD-10-CM

## 2021-08-01 ENCOUNTER — Other Ambulatory Visit: Payer: Self-pay | Admitting: Family Medicine

## 2021-08-01 DIAGNOSIS — M1A0720 Idiopathic chronic gout, left ankle and foot, without tophus (tophi): Secondary | ICD-10-CM

## 2021-08-02 NOTE — Telephone Encounter (Signed)
Called pt to make appt. Call cannot be completed at this time.

## 2021-08-02 NOTE — Telephone Encounter (Signed)
Requested medications are due for refill today.  yes  Requested medications are on the active medications list.  yes  Last refill. 06/27/2021 #30 0 refills  Future visit scheduled.   no  Notes to clinic.  Pt already given a courtesy refill.    Requested Prescriptions  Pending Prescriptions Disp Refills   allopurinol (ZYLOPRIM) 300 MG tablet [Pharmacy Med Name: ALLOPURINOL 300 MG TABS 300 Tablet] 30 tablet 10    Sig: TAKE 1 TABLET BY MOUTH DAILY *MUST HAVE OFFICE VISIT FOR REFILLS* *EMERGENCY REFILL*     Endocrinology:  Gout Agents - allopurinol Failed - 08/01/2021  7:02 PM      Failed - Uric Acid in normal range and within 360 days    Uric Acid  Date Value Ref Range Status  07/27/2018 6.0 3.7 - 8.6 mg/dL Final    Comment:               Therapeutic target for gout patients: <6.0         Passed - Cr in normal range and within 360 days    Creatinine  Date Value Ref Range Status  09/22/2018 1.21 0.61 - 1.24 mg/dL Final   Creat  Date Value Ref Range Status  09/21/2015 0.94 0.70 - 1.33 mg/dL Final    Comment:      For patients > or = 56 years of age: The upper reference limit for Creatinine is approximately 13% higher for people identified as African-American.      Creatinine, Ser  Date Value Ref Range Status  12/06/2020 1.10 0.76 - 1.27 mg/dL Final   Creatinine, Urine  Date Value Ref Range Status  09/21/2015 171 20 - 370 mg/dL Final         Passed - Valid encounter within last 12 months    Recent Outpatient Visits           7 months ago Acute non-recurrent frontal sinusitis   [place], Cari S, PA-C   8 months ago Type 2 diabetes mellitus with obesity ([place])   [place], Charlane Ferretti, MD   10 months ago Lower GI bleed   [place], [place], MD   10 months ago Type 2 diabetes mellitus with obesity ([place])   [place],  Charlane Ferretti, MD   1 year ago Type 2 diabetes mellitus with obesity [facility])   [place] Ladell Pier, MD              Passed - CBC within normal limits and completed in the last 12 months    WBC  Date Value Ref Range Status  09/17/2020 7.9 4.0 - 10.5 K/uL Final   RBC  Date Value Ref Range Status  09/17/2020 4.64 4.22 - 5.81 MIL/uL Final   Hemoglobin  Date Value Ref Range Status  09/18/2020 13.1 13.0 - 17.0 g/dL Final  03/18/2019 13.0 13.0 - 17.7 g/dL Final   HCT  Date Value Ref Range Status  09/18/2020 40.8 39.0 - 52.0 % Final    Comment:    Performed at [facility], [place] [address], [place], [place] [address]   Hematocrit  Date Value Ref Range Status  03/18/2019 39.8 37.5 - 51.0 % Final   MCHC  Date Value Ref Range Status  09/17/2020 32.2 30.0 - 36.0 g/dL Final   [facility]  Date Value Ref Range Status  09/17/2020 27.8 26.0 - 34.0 pg Final   MCV  Date Value Ref Range Status  09/17/2020 86.4 80.0 - 100.0 fL Final  03/18/2019 85 79 - 97 fL Final   No results found for: "PLTCOUNTKUC", "LABPLAT", "POCPLA" RDW  Date Value Ref Range Status  09/17/2020 15.1 11.5 - 15.5 % Final  03/18/2019 14.0 11.6 - 15.4 % Final

## 2021-08-07 ENCOUNTER — Other Ambulatory Visit: Payer: Self-pay | Admitting: Family Medicine

## 2021-08-07 DIAGNOSIS — M1A0720 Idiopathic chronic gout, left ankle and foot, without tophus (tophi): Secondary | ICD-10-CM

## 2021-08-13 ENCOUNTER — Other Ambulatory Visit: Payer: Self-pay

## 2021-08-13 ENCOUNTER — Emergency Department (HOSPITAL_COMMUNITY)
Admission: EM | Admit: 2021-08-13 | Discharge: 2021-08-13 | Disposition: A | Discharge status: Home / Self Care | Payer: Medicare Other | Attending: Emergency Medicine | Admitting: Emergency Medicine

## 2021-08-13 ENCOUNTER — Emergency Department (HOSPITAL_COMMUNITY): Payer: Medicare Other

## 2021-08-13 ENCOUNTER — Encounter (HOSPITAL_COMMUNITY): Payer: Self-pay

## 2021-08-13 ENCOUNTER — Other Ambulatory Visit: Payer: Self-pay | Admitting: Family Medicine

## 2021-08-13 DIAGNOSIS — Z79899 Other long term (current) drug therapy: Secondary | ICD-10-CM | POA: Insufficient documentation

## 2021-08-13 DIAGNOSIS — M25551 Pain in right hip: Secondary | ICD-10-CM | POA: Diagnosis not present

## 2021-08-13 DIAGNOSIS — W010XXA Fall on same level from slipping, tripping and stumbling without subsequent striking against object, initial encounter: Secondary | ICD-10-CM | POA: Diagnosis not present

## 2021-08-13 DIAGNOSIS — S39012A Strain of muscle, fascia and tendon of lower back, initial encounter: Secondary | ICD-10-CM | POA: Diagnosis not present

## 2021-08-13 DIAGNOSIS — M1A0720 Idiopathic chronic gout, left ankle and foot, without tophus (tophi): Secondary | ICD-10-CM

## 2021-08-13 DIAGNOSIS — G8929 Other chronic pain: Secondary | ICD-10-CM

## 2021-08-13 DIAGNOSIS — S3992XA Unspecified injury of lower back, initial encounter: Secondary | ICD-10-CM | POA: Diagnosis present

## 2021-08-13 MED ORDER — METHYLPREDNISOLONE 4 MG PO TBPK: ORAL_TABLET | ORAL | 0 refills | Status: AC

## 2021-08-13 NOTE — Discharge Instructions (Addendum)
You were seen in the emergency department today for lower back and hip pain.  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  I am sending you a course of some steroid medication, that you can take alongside your previously prescribed pain medication.  You can use lidocaine patches from the drugstore.  I recommend following up with your primary doctor to discuss long-term management of your back pain.

## 2021-08-13 NOTE — ED Triage Notes (Signed)
Pt arrived POV from home c/o right hip pain that is chronic but states it got so bad today I took 3 oxycodone's 5-325 before coming.

## 2021-08-13 NOTE — ED Notes (Signed)
Discharge instructions reviewed with patient. Patient verbalized understanding of instructions. Follow-up care and medications were reviewed. Patient ambulatory with steady gait. VSS upon discharge.  ?

## 2021-08-13 NOTE — ED Provider Notes (Signed)
[facility] EMERGENCY DEPARTMENT Provider Note   CSN: [id number] Arrival date & time: 08/13/21  1349     History  Chief Complaint  Patient presents with   Hip Pain    Terry Poole is a 56 y.o. male who presents emergency department complaining of lower back pain and right hip pain.  Patient states that he has had chronic lower back pain, but it got worse after he slipped and fell today.  He said he was walking around his house and tripped over something, and fell onto his right hip.  He states pain was so severe that he took 3 of his prescribed oxycodone 5/325 before coming here today.  He states the pain radiates down his right leg, without numbness.  No saddle anesthesia, urinary retention, or urine or bowel incontinence.  There is no head trauma with the fall, or loss of consciousness.   Hip Pain       Home Medications Prior to Admission medications   Medication Sig Start Date End Date Taking? Authorizing Provider  methylPREDNISolone (MEDROL DOSEPAK) 4 MG TBPK tablet Take per package instructions 08/13/21  Yes Mayo Owczarzak T, PA-C  allopurinol (ZYLOPRIM) 300 MG tablet TAKE 1 TABLET BY MOUTH DAILY 06/27/21   [name], MD  amLODipine (NORVASC) 5 MG tablet Take 1 tablet (5 mg total) by mouth daily. 12/04/20   [name], MD  atorvastatin (LIPITOR) 40 MG tablet TAKE 1 TABLET BY MOUTH DAILY 04/05/21   [name], MD  azithromycin (ZITHROMAX) 250 MG tablet Take 2 tablets by mouth day 1, then take 1 tab by mouth once daily 12/10/20   Mayers, Cari S, PA-C  benzonatate (TESSALON) 100 MG capsule Take 1 capsule (100 mg total) by mouth 2 (two) times daily as needed for cough. 12/10/20   Mayers, Cari S, PA-C  cetirizine (ZYRTEC) 10 MG tablet Take 1 tablet (10 mg total) by mouth daily. 12/10/20   Mayers, Cari S, PA-C  Colchicine 0.6 MG CAPS TAKE TWO CAPSULES BY MOUTH AT ONSET OF A GOUT ATTACK, MAY REPEAT 1 CAPSULE IN 2 HOURS IF SYMPTOMS PERSIST (VIAL) 12/01/20    [name], MD  diclofenac Sodium (VOLTAREN) 1 % GEL Apply 2 g topically 4 (four) times daily. Patient not taking: Reported on 12/04/2020 06/29/20   Ladell Pier, MD  DULoxetine (CYMBALTA) 60 MG capsule TAKE 1 CAPSULE BY MOUTH DAILY FOR BACK 04/05/21   [name], MD  fluticasone (FLONASE) 50 MCG/ACT nasal spray Place 2 sprays into both nostrils daily. 12/10/20   Mayers, Cari S, PA-C  glipiZIDE (GLUCOTROL) 5 MG tablet Take 1 tablet (5 mg total) by mouth 2 (two) times daily before a meal. 12/04/20   [name], MD  iron polysaccharides (NIFEREX) 150 MG capsule Take by mouth. 01/08/19   [provider]  lidocaine (LIDODERM) 5 % Place 1 patch onto the skin daily. Remove & Discard patch within 12 hours or as directed by MD Patient not taking: Reported on 12/04/2020 01/19/20   [name], MD  lisinopril (ZESTRIL) 20 MG tablet TAKE 1 TABLET BY MOUTH DAILY 04/05/21   [name], MD  metFORMIN (GLUCOPHAGE) 1000 MG tablet TAKE ONE (1) TABLET BY MOUTH TWICE DAILY WITH A MEAL 04/10/21   [name], MD  methocarbamol (ROBAXIN-750) 750 MG tablet Take 1 tablet (750 mg total) by mouth 2 (two) times daily as needed for muscle spasms. 12/04/20   [name], MD  Misc. Devices MISC Nebulizer machine. Dx: Bronchitis 09/18/20   [place]lin,  Enobong, MD  nortriptyline (PAMELOR) 25 MG capsule TAKE 1 CAPSULE BY MOUTH AT BEDTIME 10/18/20   Tomi Likens, Adam R, DO  omeprazole (PRILOSEC) 40 MG capsule TAKE 1 CAPSULE BY MOUTH DAILY 01/09/21   [name], MD  oxyCODONE-acetaminophen (PERCOCET/ROXICET) 5-325 MG tablet Take 1 tablet by mouth every 8 (eight) hours as needed for severe pain. Patient not taking: Reported on 12/04/2020 03/28/20   Rayna Sexton, PA-C  sildenafil (VIAGRA) 50 MG tablet TAKE 1 TABLET BY MOUTH DAILY AS NEEDED FOR ERECTILE DYSFUNCTION. AT LEAST 24 HOURS BETWEEN DOSES 12/09/19   [name], MD  trimethoprim-polymyxin b (POLYTRIM) ophthalmic solution Place 1 drop into the  right eye every 6 (six) hours. 12/04/20   [name], MD  FLUoxetine (PROZAC) 20 MG tablet Take 1 tablet (20 mg total) by mouth daily. 10/25/19 01/30/20  [name], MD      Allergies    Vicodin [hydrocodone-acetaminophen]    Review of Systems   Review of Systems  Musculoskeletal:  Positive for arthralgias and back pain.  Neurological:  Negative for weakness and numbness.  All other systems reviewed and are negative.   Physical Exam Updated Vital Signs BP 124/62 (BP Location: Left Arm)   Pulse 96   Temp 98.3 F (36.8 C) (Oral)   Resp 14   Ht '5\' 9"'$  (1.753 m)   Wt 104.3 kg   SpO2 95%   BMI 33.97 kg/m  Physical Exam Vitals and nursing note reviewed.  Constitutional:      Appearance: Normal appearance.  HENT:     Head: Normocephalic and atraumatic.  Eyes:     Conjunctiva/sclera: Conjunctivae normal.  Pulmonary:     Effort: Pulmonary effort is normal. No respiratory distress.  Musculoskeletal:     Comments: Full passive ROM of all regions of spine.  Right sided araspinal muscular tenderness to palpation in lumbar region.  No midline spinal tenderness, step-offs or crepitus.  Strength 5/5 in all extremities.  Sensation intact in all extremities.  Skin:    General: Skin is warm and dry.  Neurological:     Mental Status: He is alert.  Psychiatric:        Mood and Affect: Mood normal.        Behavior: Behavior normal.     ED Results / Procedures / Treatments   Labs (all labs ordered are listed, but only abnormal results are displayed) Labs Reviewed - No data to display  EKG None  Radiology DG Lumbar Spine Complete  Result Date: 08/13/2021 CLINICAL DATA:  Severe low back and right leg pain.  No injury. EXAM: LUMBAR SPINE - COMPLETE 4+ VIEW COMPARISON:  CT abdomen pelvis dated February 12, 2019. FINDINGS: Unchanged transitional lumbosacral anatomy with partial sacralization of L5. No acute fracture or subluxation. Vertebral body heights are preserved. Alignment  is normal. Unchanged moderate disc height loss throughout the lumbar spine. The sacroiliac joints are unremarkable. IMPRESSION: 1. Unchanged moderate multilevel lumbar spondylosis. 2. Unchanged transitional lumbosacral anatomy. Correlation with radiographs is recommended prior to any operative intervention. Electronically Signed   By: Titus Dubin M.D.   On: 08/13/2021 15:45    Procedures Procedures    Medications Ordered in ED Medications - No data to display  ED Course/ Medical Decision Making/ A&P                           Medical Decision Making  Patient is a 56 year old male who presents the emergency department complaining of  acute on chronic lower back pain.  Patient states that he slipped and fell in his house, and fell onto his right side and has been having worsening pain since.  Back pain radiates down his right leg.  No numbness.  No urinary retention, or urine or bowel incontinence.  On exam patient has right lower paraspinal muscular tenderness to palpation.  Normal strength and sensation in all extremities.  X-ray of lumbar spine compared to prior CT imaging shows unchanged multilevel lumbar spondylosis.  Suspect that patient symptoms are likely related to lumbar muscle strain versus flare of arthropathy. Low concern for myelopathy or atraumatic causes such as epidural abscess. Will discharge to home with course of steroid medication and recommend patient continue previously prescribed pain medication.  Recommend he follow-up with his primary doctor to discuss long-term management of his chronic back pain.  Final Clinical Impression(s) / ED Diagnoses Final diagnoses:  Chronic right-sided low back pain without sciatica  Strain of lumbar region, initial encounter    Rx / DC Orders ED Discharge Orders          Ordered    methylPREDNISolone (MEDROL DOSEPAK) 4 MG TBPK tablet        08/13/21 1756           Portions of this report may have been transcribed using  voice recognition software. Every effort was made to ensure accuracy; however, inadvertent computerized transcription errors may be present.    [place] Hankin T, PA-C 43/88/87 [id number]    Campbell Stall P, DO 28/20/60 1504

## 2021-08-13 NOTE — ED Provider Triage Note (Signed)
Emergency Medicine Provider Triage Evaluation Note  Terry Poole , a 56 y.o. male  was evaluated in triage.  Patient states that he has pain in his right hip that is chronic but it got severely worsened today.  Upon further vesication, he states the pain starts in his lower back and radiates down the back of his right leg.  Patient notes that pain was so severe today that he took 3 oxycodones 5/325 before coming here today.  He appears slightly overmedicated during my evaluation with slow speech and drowsiness.  Review of Systems  Positive:  Negative:   Physical Exam  BP 124/62 (BP Location: Left Arm)   Pulse 96   Temp 98.3 F (36.8 C) (Oral)   Resp 14   Ht '5\' 9"'$  (1.753 m)   Wt 104.3 kg   SpO2 95%   BMI 33.97 kg/m  Gen:   Awake, no distress , appears drowsy with slow speech Resp:  Normal effort  MSK:   Moves extremities without difficulty, mildly tender to palpation of the lower back Other:  Blood pressure, oxygen normal.  Medical Decision Making  Medically screening exam initiated at 2:48 PM.  Appropriate orders placed.  Terry Poole was informed that the remainder of the evaluation will be completed by another provider, this initial triage assessment does not replace that evaluation, and the importance of remaining in the ED until their evaluation is complete.     Tonye Pearson, [place] 08/13/21 1455

## 2021-08-16 ENCOUNTER — Other Ambulatory Visit: Payer: Self-pay | Admitting: Family Medicine

## 2021-08-16 DIAGNOSIS — M1A0720 Idiopathic chronic gout, left ankle and foot, without tophus (tophi): Secondary | ICD-10-CM

## 2021-08-16 NOTE — Telephone Encounter (Signed)
Requested medication (s) are due for refill today: yes  Requested medication (s) are on the active medication list: yes  Last refill:  06/27/21 #30/0  Future visit scheduled: no  Notes to clinic:  Unable to refill per protocol, appointment needed. Pt called but number in chart not working.     Requested Prescriptions  Pending Prescriptions Disp Refills   allopurinol (ZYLOPRIM) 300 MG tablet [Pharmacy Med Name: ALLOPURINOL 300 MG TABS 300 Tablet] 30 tablet 10    Sig: TAKE 1 TABLET BY MOUTH DAILY *MUST HAVE OFFICE VISIT FOR REFILLS* *EMERGENCY REFILL*     Endocrinology:  Gout Agents - allopurinol Failed - 08/16/2021 11:55 AM      Failed - Uric Acid in normal range and within 360 days    Uric Acid  Date Value Ref Range Status  07/27/2018 6.0 3.7 - 8.6 mg/dL Final    Comment:               Therapeutic target for gout patients: <6.0         Passed - Cr in normal range and within 360 days    Creatinine  Date Value Ref Range Status  09/22/2018 1.21 0.61 - 1.24 mg/dL Final   Creat  Date Value Ref Range Status  09/21/2015 0.94 0.70 - 1.33 mg/dL Final    Comment:      For patients > or = 56 years of age: The upper reference limit for Creatinine is approximately 13% higher for people identified as African-American.      Creatinine, Ser  Date Value Ref Range Status  12/06/2020 1.10 0.76 - 1.27 mg/dL Final   Creatinine, Urine  Date Value Ref Range Status  09/21/2015 171 20 - 370 mg/dL Final         Passed - Valid encounter within last 12 months    Recent Outpatient Visits           8 months ago Acute non-recurrent frontal sinusitis   [place], Cari S, PA-C   8 months ago Type 2 diabetes mellitus with obesity ([place])   [place], Charlane Ferretti, MD   11 months ago Lower GI bleed   [place], [place], MD   11 months ago Type 2 diabetes mellitus with obesity ([place])    [place], Charlane Ferretti, MD   1 year ago Type 2 diabetes mellitus with obesity [facility])   [place] Ladell Pier, MD              Passed - CBC within normal limits and completed in the last 12 months    WBC  Date Value Ref Range Status  09/17/2020 7.9 4.0 - 10.5 K/uL Final   RBC  Date Value Ref Range Status  09/17/2020 4.64 4.22 - 5.81 MIL/uL Final   Hemoglobin  Date Value Ref Range Status  09/18/2020 13.1 13.0 - 17.0 g/dL Final  03/18/2019 13.0 13.0 - 17.7 g/dL Final   HCT  Date Value Ref Range Status  09/18/2020 40.8 39.0 - 52.0 % Final    Comment:    Performed at [facility], [place] [address] [place], [place] [address]   Hematocrit  Date Value Ref Range Status  03/18/2019 39.8 37.5 - 51.0 % Final   MCHC  Date Value Ref Range Status  09/17/2020 32.2 30.0 - 36.0 g/dL Final   [facility]  Date Value Ref Range Status  09/17/2020 27.8 26.0 - 34.0 pg Final   MCV  Date Value Ref Range Status  09/17/2020 86.4 80.0 - 100.0 fL Final  03/18/2019 85 79 - 97 fL Final   No results found for: "PLTCOUNTKUC", "LABPLAT", "POCPLA" RDW  Date Value Ref Range Status  09/17/2020 15.1 11.5 - 15.5 % Final  03/18/2019 14.0 11.6 - 15.4 % Final

## 2021-08-19 ENCOUNTER — Other Ambulatory Visit: Payer: Self-pay | Admitting: Family Medicine

## 2021-08-19 DIAGNOSIS — M1A0720 Idiopathic chronic gout, left ankle and foot, without tophus (tophi): Secondary | ICD-10-CM

## 2021-08-20 NOTE — Telephone Encounter (Signed)
Requested medications are due for refill today.  yes  Requested medications are on the active medications list.  yes  Last refill. 07/30/2021 #30 0 refills  Future visit scheduled.   no  Notes to clinic.  Refill has been refused by provider several times.    Requested Prescriptions  Pending Prescriptions Disp Refills   allopurinol (ZYLOPRIM) 300 MG tablet [Pharmacy Med Name: ALLOPURINOL 300 MG TABS 300 Tablet] 30 tablet 10    Sig: TAKE 1 TABLET BY MOUTH DAILY *MUST HAVE OFFICE VISIT FOR REFILLS* *EMERGENCY REFILL*     Endocrinology:  Gout Agents - allopurinol Failed - 08/19/2021  7:16 PM      Failed - Uric Acid in normal range and within 360 days    Uric Acid  Date Value Ref Range Status  07/27/2018 6.0 3.7 - 8.6 mg/dL Final    Comment:               Therapeutic target for gout patients: <6.0         Passed - Cr in normal range and within 360 days    Creatinine  Date Value Ref Range Status  09/22/2018 1.21 0.61 - 1.24 mg/dL Final   Creat  Date Value Ref Range Status  09/21/2015 0.94 0.70 - 1.33 mg/dL Final    Comment:      For patients > or = 56 years of age: The upper reference limit for Creatinine is approximately 13% higher for people identified as African-American.      Creatinine, Ser  Date Value Ref Range Status  12/06/2020 1.10 0.76 - 1.27 mg/dL Final   Creatinine, Urine  Date Value Ref Range Status  09/21/2015 171 20 - 370 mg/dL Final         Passed - Valid encounter within last 12 months    Recent Outpatient Visits           8 months ago Acute non-recurrent frontal sinusitis   [place], Cari S, PA-C   8 months ago Type 2 diabetes mellitus with obesity ([place])   [place], Charlane Ferretti, MD   11 months ago Lower GI bleed   [place], [place], MD   11 months ago Type 2 diabetes mellitus with obesity ([place])   [place], Charlane Ferretti, MD   1 year ago Type 2 diabetes mellitus with obesity [facility])   [place] Ladell Pier, MD              Passed - CBC within normal limits and completed in the last 12 months    WBC  Date Value Ref Range Status  09/17/2020 7.9 4.0 - 10.5 K/uL Final   RBC  Date Value Ref Range Status  09/17/2020 4.64 4.22 - 5.81 MIL/uL Final   Hemoglobin  Date Value Ref Range Status  09/18/2020 13.1 13.0 - 17.0 g/dL Final  03/18/2019 13.0 13.0 - 17.7 g/dL Final   HCT  Date Value Ref Range Status  09/18/2020 40.8 39.0 - 52.0 % Final    Comment:    Performed at [facility], [place] [address] [place], [place] [address]   Hematocrit  Date Value Ref Range Status  03/18/2019 39.8 37.5 - 51.0 % Final   MCHC  Date Value Ref Range Status  09/17/2020 32.2 30.0 - 36.0 g/dL Final   [facility]  Date Value Ref Range  Status  09/17/2020 27.8 26.0 - 34.0 pg Final   MCV  Date Value Ref Range Status  09/17/2020 86.4 80.0 - 100.0 fL Final  03/18/2019 85 79 - 97 fL Final   No results found for: "PLTCOUNTKUC", "LABPLAT", "POCPLA" RDW  Date Value Ref Range Status  09/17/2020 15.1 11.5 - 15.5 % Final  03/18/2019 14.0 11.6 - 15.4 % Final

## 2021-08-20 NOTE — Telephone Encounter (Signed)
Tried both number for pt - both said call cannot be completed at this time.

## 2021-09-11 ENCOUNTER — Encounter: Payer: Self-pay | Admitting: Gastroenterology

## 2022-01-07 ENCOUNTER — Ambulatory Visit (INDEPENDENT_AMBULATORY_CARE_PROVIDER_SITE_OTHER): Payer: Medicare Other

## 2022-01-07 ENCOUNTER — Ambulatory Visit (INDEPENDENT_AMBULATORY_CARE_PROVIDER_SITE_OTHER): Payer: Medicare Other | Admitting: Podiatry

## 2022-01-07 DIAGNOSIS — M2061 Acquired deformities of toe(s), unspecified, right foot: Secondary | ICD-10-CM | POA: Diagnosis not present

## 2022-01-07 DIAGNOSIS — M79671 Pain in right foot: Secondary | ICD-10-CM

## 2022-01-07 DIAGNOSIS — L989 Disorder of the skin and subcutaneous tissue, unspecified: Secondary | ICD-10-CM | POA: Diagnosis not present

## 2022-01-14 NOTE — Progress Notes (Signed)
Subjective:   Patient ID: Terry Poole, male   DOB: 56 y.o.   MRN: [id number]   HPI Chief Complaint  Patient presents with   Toe Pain    Right 2nd toe pain, rate of a 10 out of 10, X-rays taken today     56 year old male presents the office with above concerns.  He previously had surgery with Dr. Cannon Kettle to help fix the second toe he is having pain along the medial aspect of the second toe as he has been rubbing the big toe.  No drainage or pus.  Denies any open lesions.  No injuries.   Review of Systems  All other systems reviewed and are negative.  Past Medical History:  Diagnosis Date   Bronchitis    Bronchitis    DVT (deep venous thrombosis) (HCC)    Gout    Hammertoe of second toe of right foot    Smoker     Past Surgical History:  Procedure Laterality Date   HAMMER TOE SURGERY Right 03/10/2016   Procedure: 2ND RIGHT HAMMER TOE CORRECTION;  Surgeon: Landis Martins, DPM;  Location: [facility];  Service: Podiatry;  Laterality: Right;   MASS EXCISION Right 03/10/2016   Procedure: EXCISION OF CALLUS 2ND RIGHT TOE;  Surgeon: Landis Martins, DPM;  Location: [facility];  Service: Podiatry;  Laterality: Right;   NO PAST SURGERIES       Current Outpatient Medications:    allopurinol (ZYLOPRIM) 300 MG tablet, TAKE 1 TABLET BY MOUTH DAILY *MUST HAVE OFFICE VISIT FOR REFILLS* *EMERGENCY REFILL*, Disp: 30 tablet, Rfl: 10   amLODipine (NORVASC) 5 MG tablet, Take 1 tablet (5 mg total) by mouth daily., Disp: 90 tablet, Rfl: 1   atorvastatin (LIPITOR) 40 MG tablet, TAKE 1 TABLET BY MOUTH DAILY, Disp: 30 tablet, Rfl: 0   azithromycin (ZITHROMAX) 250 MG tablet, Take 2 tablets by mouth day 1, then take 1 tab by mouth once daily, Disp: 6 tablet, Rfl: 0   benzonatate (TESSALON) 100 MG capsule, Take 1 capsule (100 mg total) by mouth 2 (two) times daily as needed for cough., Disp: 20 capsule, Rfl: 0   cetirizine (ZYRTEC) 10 MG tablet, Take 1 tablet (10 mg total) by  mouth daily., Disp: 30 tablet, Rfl: 11   Colchicine 0.6 MG CAPS, TAKE TWO CAPSULES BY MOUTH AT ONSET OF A GOUT ATTACK, MAY REPEAT 1 CAPSULE IN 2 HOURS IF SYMPTOMS PERSIST (VIAL), Disp: 30 capsule, Rfl: 1   diclofenac Sodium (VOLTAREN) 1 % GEL, Apply 2 g topically 4 (four) times daily. (Patient not taking: Reported on 12/04/2020), Disp: 100 g, Rfl: 3   DULoxetine (CYMBALTA) 60 MG capsule, TAKE 1 CAPSULE BY MOUTH DAILY FOR BACK, Disp: 30 capsule, Rfl: 0   fluticasone (FLONASE) 50 MCG/ACT nasal spray, Place 2 sprays into both nostrils daily., Disp: 16 g, Rfl: 6   glipiZIDE (GLUCOTROL) 5 MG tablet, Take 1 tablet (5 mg total) by mouth 2 (two) times daily before a meal., Disp: 60 tablet, Rfl: 3   iron polysaccharides (NIFEREX) 150 MG capsule, Take by mouth., Disp: , Rfl:    lidocaine (LIDODERM) 5 %, Place 1 patch onto the skin daily. Remove & Discard patch within 12 hours or as directed by MD (Patient not taking: Reported on 12/04/2020), Disp: 30 patch, Rfl: 1   lisinopril (ZESTRIL) 20 MG tablet, TAKE 1 TABLET BY MOUTH DAILY, Disp: 30 tablet, Rfl: 0   metFORMIN (GLUCOPHAGE) 1000 MG tablet, TAKE ONE (1) TABLET BY MOUTH  TWICE DAILY WITH A MEAL, Disp: 60 tablet, Rfl: 0   methocarbamol (ROBAXIN-750) 750 MG tablet, Take 1 tablet (750 mg total) by mouth 2 (two) times daily as needed for muscle spasms., Disp: 60 tablet, Rfl: 1   methylPREDNISolone (MEDROL DOSEPAK) 4 MG TBPK tablet, Take per package instructions, Disp: 1 each, Rfl: 0   Misc. Devices MISC, Nebulizer machine. Dx: Bronchitis, Disp: 1 each, Rfl: 0   nortriptyline (PAMELOR) 25 MG capsule, TAKE 1 CAPSULE BY MOUTH AT BEDTIME, Disp: 90 capsule, Rfl: 0   omeprazole (PRILOSEC) 40 MG capsule, TAKE 1 CAPSULE BY MOUTH DAILY, Disp: 30 capsule, Rfl: 10   oxyCODONE-acetaminophen (PERCOCET/ROXICET) 5-325 MG tablet, Take 1 tablet by mouth every 8 (eight) hours as needed for severe pain. (Patient not taking: Reported on 12/04/2020), Disp: 6 tablet, Rfl: 0   sildenafil  (VIAGRA) 50 MG tablet, TAKE 1 TABLET BY MOUTH DAILY AS NEEDED FOR ERECTILE DYSFUNCTION. AT LEAST 24 HOURS BETWEEN DOSES, Disp: 4 tablet, Rfl: 2   trimethoprim-polymyxin b (POLYTRIM) ophthalmic solution, Place 1 drop into the right eye every 6 (six) hours., Disp: 10 mL, Rfl: 0  Allergies  Allergen Reactions   Vicodin [Hydrocodone-Acetaminophen] Hives    Patient stated he is no longer allergic to vicodin, patient stated that he was allergic about 12 years ago.           Objective:  Physical Exam  General: AAO x3, NAD  Dermatological: Hyperkeratotic lesion medial aspect second IPJ.  No underlying ulceration drainage or signs of infection.  Vascular: Dorsalis Pedis artery and Posterior Tibial artery pedal pulses are 2/4 bilateral with immedate capillary fill time. There is no pain with calf compression, swelling, warmth, erythema.   Neruologic: Grossly intact via light touch bilateral.  Musculoskeletal: Medial deviation of the second toe of the hallux, second toe repeat each other causing skin lesion.  Muscular strength 5/5 in all groups tested bilateral.  Gait: Unassisted, Nonantalgic.       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     Plan:  -Treatment options discussed including all alternatives, risks, and complications -Etiology of symptoms were discussed -X-rays were obtained and reviewed with the patient.  Previous surgical repair of the right second toe.  There is medial deviation of the digit.  There is no evidence of acute fracture. -Sharply debrided the callus with any complications or bleeding.  Discussed with conservative as well as surgical treatment options.  Conservatively discussed shoe modifications, offloading, padding.  Dispensed a toe separator.  Long-term can also consider revisional surgery as well  Trula Slade DPM

## 2022-01-29 ENCOUNTER — Other Ambulatory Visit: Payer: Self-pay | Admitting: Podiatry

## 2022-01-29 DIAGNOSIS — L989 Disorder of the skin and subcutaneous tissue, unspecified: Secondary | ICD-10-CM

## 2022-01-29 DIAGNOSIS — M79671 Pain in right foot: Secondary | ICD-10-CM

## 2022-01-29 DIAGNOSIS — M2061 Acquired deformities of toe(s), unspecified, right foot: Secondary | ICD-10-CM
